# Patient Record
Sex: Female | Born: 1945
Health system: Southern US, Community
[De-identification: ages and names within clinical notes are randomized; demographics above are authoritative.]

## PROBLEM LIST (undated history)

## (undated) DIAGNOSIS — Z8051 Family history of malignant neoplasm of kidney: Secondary | ICD-10-CM

## (undated) DIAGNOSIS — I951 Orthostatic hypotension: Secondary | ICD-10-CM

## (undated) DIAGNOSIS — H269 Unspecified cataract: Secondary | ICD-10-CM

## (undated) DIAGNOSIS — E119 Type 2 diabetes mellitus without complications: Secondary | ICD-10-CM

## (undated) DIAGNOSIS — C541 Malignant neoplasm of endometrium: Secondary | ICD-10-CM

## (undated) DIAGNOSIS — K420 Umbilical hernia with obstruction, without gangrene: Secondary | ICD-10-CM

## (undated) DIAGNOSIS — M1990 Unspecified osteoarthritis, unspecified site: Secondary | ICD-10-CM

## (undated) DIAGNOSIS — Z9109 Other allergy status, other than to drugs and biological substances: Secondary | ICD-10-CM

## (undated) DIAGNOSIS — F329 Major depressive disorder, single episode, unspecified: Secondary | ICD-10-CM

## (undated) DIAGNOSIS — K649 Unspecified hemorrhoids: Secondary | ICD-10-CM

## (undated) DIAGNOSIS — Z803 Family history of malignant neoplasm of breast: Secondary | ICD-10-CM

## (undated) DIAGNOSIS — Z8049 Family history of malignant neoplasm of other genital organs: Secondary | ICD-10-CM

## (undated) DIAGNOSIS — Z800 Family history of malignant neoplasm of digestive organs: Secondary | ICD-10-CM

## (undated) DIAGNOSIS — F32A Depression, unspecified: Secondary | ICD-10-CM

## (undated) DIAGNOSIS — Z923 Personal history of irradiation: Secondary | ICD-10-CM

## (undated) DIAGNOSIS — C4491 Basal cell carcinoma of skin, unspecified: Secondary | ICD-10-CM

## (undated) HISTORY — PX: ABDOMINAL HYSTERECTOMY: SHX81

## (undated) HISTORY — DX: Depression, unspecified: F32.A

## (undated) HISTORY — DX: Family history of malignant neoplasm of kidney: Z80.51

## (undated) HISTORY — DX: Umbilical hernia with obstruction, without gangrene: K42.0

## (undated) HISTORY — DX: Type 2 diabetes mellitus without complications: E11.9

## (undated) HISTORY — DX: Personal history of irradiation: Z92.3

## (undated) HISTORY — DX: Orthostatic hypotension: I95.1

## (undated) HISTORY — DX: Family history of malignant neoplasm of other genital organs: Z80.49

## (undated) HISTORY — DX: Family history of malignant neoplasm of breast: Z80.3

## (undated) HISTORY — DX: Unspecified cataract: H26.9

## (undated) HISTORY — DX: Other allergy status, other than to drugs and biological substances: Z91.09

## (undated) HISTORY — DX: Family history of malignant neoplasm of digestive organs: Z80.0

## (undated) HISTORY — DX: Major depressive disorder, single episode, unspecified: F32.9

## (undated) HISTORY — PX: TONSILLECTOMY: SUR1361

## (undated) HISTORY — DX: Unspecified hemorrhoids: K64.9

## (undated) HISTORY — PX: CATARACT EXTRACTION: SUR2

---

## 1998-02-11 ENCOUNTER — Ambulatory Visit (HOSPITAL_COMMUNITY): Admission: RE | Admit: 1998-02-11 | Discharge: 1998-02-11 | Payer: Self-pay | Admitting: Family Medicine

## 1998-06-03 ENCOUNTER — Other Ambulatory Visit: Admission: RE | Admit: 1998-06-03 | Discharge: 1998-06-03 | Payer: Self-pay | Admitting: Family Medicine

## 1998-07-11 ENCOUNTER — Encounter: Admission: RE | Admit: 1998-07-11 | Discharge: 1998-10-09 | Payer: Self-pay | Admitting: Anesthesiology

## 1998-10-29 ENCOUNTER — Ambulatory Visit (HOSPITAL_COMMUNITY): Admission: RE | Admit: 1998-10-29 | Discharge: 1998-10-29 | Payer: Self-pay | Admitting: Family Medicine

## 1998-10-29 ENCOUNTER — Encounter: Payer: Self-pay | Admitting: Family Medicine

## 1999-08-19 ENCOUNTER — Encounter: Payer: Self-pay | Admitting: Specialist

## 1999-08-19 ENCOUNTER — Ambulatory Visit (HOSPITAL_COMMUNITY): Admission: RE | Admit: 1999-08-19 | Discharge: 1999-08-19 | Payer: Self-pay | Admitting: Specialist

## 1999-09-10 ENCOUNTER — Other Ambulatory Visit: Admission: RE | Admit: 1999-09-10 | Discharge: 1999-09-10 | Payer: Self-pay | Admitting: Family Medicine

## 1999-09-18 ENCOUNTER — Encounter: Payer: Self-pay | Admitting: Specialist

## 1999-09-25 ENCOUNTER — Encounter: Payer: Self-pay | Admitting: Specialist

## 1999-09-25 ENCOUNTER — Encounter (INDEPENDENT_AMBULATORY_CARE_PROVIDER_SITE_OTHER): Payer: Self-pay | Admitting: Specialist

## 1999-09-25 ENCOUNTER — Observation Stay (HOSPITAL_COMMUNITY): Admission: RE | Admit: 1999-09-25 | Discharge: 1999-09-26 | Payer: Self-pay | Admitting: Specialist

## 2000-02-26 ENCOUNTER — Encounter: Admission: RE | Admit: 2000-02-26 | Discharge: 2000-03-25 | Payer: Self-pay | Admitting: Family Medicine

## 2003-06-29 ENCOUNTER — Encounter: Admission: RE | Admit: 2003-06-29 | Discharge: 2003-06-29 | Payer: Self-pay | Admitting: Internal Medicine

## 2004-01-17 ENCOUNTER — Ambulatory Visit: Payer: Self-pay | Admitting: Family Medicine

## 2004-02-17 HISTORY — PX: LUMBAR DISC SURGERY: SHX700

## 2004-04-24 ENCOUNTER — Ambulatory Visit: Payer: Self-pay | Admitting: Family Medicine

## 2004-07-15 ENCOUNTER — Inpatient Hospital Stay (HOSPITAL_COMMUNITY): Admission: RE | Admit: 2004-07-15 | Discharge: 2004-07-20 | Payer: Self-pay | Admitting: Orthopedic Surgery

## 2004-08-25 ENCOUNTER — Ambulatory Visit: Payer: Self-pay | Admitting: Family Medicine

## 2004-11-25 ENCOUNTER — Ambulatory Visit: Payer: Self-pay | Admitting: Family Medicine

## 2005-03-04 ENCOUNTER — Ambulatory Visit: Payer: Self-pay | Admitting: Family Medicine

## 2005-06-02 ENCOUNTER — Ambulatory Visit: Payer: Self-pay | Admitting: Family Medicine

## 2005-06-29 ENCOUNTER — Ambulatory Visit: Payer: Self-pay | Admitting: Family Medicine

## 2005-10-15 ENCOUNTER — Ambulatory Visit: Payer: Self-pay | Admitting: Family Medicine

## 2005-12-18 ENCOUNTER — Ambulatory Visit: Payer: Self-pay | Admitting: Family Medicine

## 2005-12-29 ENCOUNTER — Ambulatory Visit: Payer: Self-pay | Admitting: Family Medicine

## 2006-01-14 ENCOUNTER — Ambulatory Visit: Payer: Self-pay | Admitting: Family Medicine

## 2006-08-04 ENCOUNTER — Ambulatory Visit: Payer: Self-pay | Admitting: Family Medicine

## 2009-04-22 ENCOUNTER — Emergency Department (HOSPITAL_COMMUNITY): Admission: EM | Admit: 2009-04-22 | Discharge: 2009-04-22 | Payer: Self-pay | Admitting: Emergency Medicine

## 2010-07-04 NOTE — Discharge Summary (Signed)
NAMEMARQUITTA, [name]:  [id number]   MEDICAL RECORD NO.:  [id number]          PATIENT TYPE:  INP   LOCATION:  5005                         FACILITY:  [facility]   PHYSICIAN:  Nelda Severe, MD      DATE OF BIRTH:  08/29/1945   DATE OF ADMISSION:  07/15/2004  DATE OF DISCHARGE:  07/20/2004                                 DISCHARGE SUMMARY   HOSPITAL COURSE:  This woman was admitted for management of back and left  leg pain, status post previous L4-5 laminectomy.  The day of admission she  was taken to the operating room where revision laminectomy and fusion were  carried out at L4-5 with laminoforaminotomy at L3-4 on the left side.  Postoperatively, she did well except for having some right thigh pain which  had previously not experienced.  CT scan was performed and reviewed prior to  her discharge.  This does not show any evidence of pedicle screw impingement  upon an exiting L4 or 5 nerve root on the right side.  At this time, the  pain is abating anyway.   She is being discharged at this time for followup in 1 month in the office.  She will call me for any problems with wound drainage or fever, or for any  other problems for that matter.  She is ambulatory with a walker.  She is  being given prescriptions for Norco 10 mg one to two q.6 h. p.r.n., 75  tablets, Flexeril one p.o. q.6 h. p.r.n. for back spasms, 30 tablets, and  Colace 100 mg p.o. b.i.d. for 3 weeks.  She has Lexapro, but has that  medication at home.   FINAL DIAGNOSIS:  Lumbar spondylosis, status post laminectomy.   CONDITION ON DISCHARGE:  Ambulatory, preoperative pain improved.   DISCHARGE MEDICATIONS:  See above.   FOLLOWUP:  See above.       MT/MEDQ  D:  07/20/2004  T:  07/21/2004  Job:  [id number]

## 2010-07-04 NOTE — Op Note (Signed)
[facility]  Patient:    Debra Miranda, Debra Miranda                         MRN: [id number] Proc. Date: 09/25/99 Attending:  Javier Docker, M.D.                           Operative Report  PREOPERATIVE DIAGNOSIS:  Herniated nucleus pulposus central L4-5 with associated spinal stenosis and lateral recess stenosis.  POSTOPERATIVE DIAGNOSIS:  Herniated nucleus pulposus central L4-5 with associated with spinal stenosis and lateral recess stenosis.  OPERATION PERFORMED:  Bilateral hemilaminotomy L4-5 and bilateral microdiskectomy L4-5.  SURGEON:  Javier Docker, M.D.  ANESTHESIA:  General.  BRIEF HISTORY:  This is a 65 year old with refractory L5 radiculopathy.  A CT myelogram indicating lateral recess stenosis L4-5.  She has a combination of central HNP and facet hypertrophy.  Operative intervention is indicated for decompression of the L5 nerve root bilaterally, bilateral recess decompression and microdiskectomy.  Risks and benefits were discussed including bleeding, infection, CSF leakage, epidural fibrosis, hematoma, etc.  Also need for fusion in the future.  DESCRIPTION OF PROCEDURE:  With the patient in the supine position after the induction of adequate general anesthesia, the patient was placed prone on the [place] frame.  All bony prominences were well padded.  The lumbar region was prepped and draped in the usual sterile fashion.  Two 18-gauge spinal needles were utilized to localize the L4-5 interspace confirmed on x-ray.  An incision was made from the spinous process of L4-5.  The subcutaneous tissue was dissected and cartilage utilized to achieve hemostasis.  The dorsolumbar fascia was identified and divided in line with the skin incision on either side of the interspinous ligament at L4-5.  The paraspinal muscles were elevated bilaterally with a Cobb elevator at lamina 4 and 5.  A McCullough retractor was placed.  A Penfield 4 was placed in the  interlaminar space and confirmed 4-5 space by x-ray.  Next, a high speed bur was utilized to perform a hemilaminotomy of 4. Bilaterally, the lateral recess was decompressed with a 2 mm Kerrison and severe lateral recess stenosis appreciated.  A foraminotomy of L5 was performed bilaterally with the 2 mm Kerrison.  The nerve root was identified and found to be compressing the lateral recess.  It was gently mobilized medially and the lateral recess was further decompressed utilizing a 2 mm Kerrison.  Partial medial hemifacetectomies were performed bilaterally.  Next, attention was turned to the site where there was extensive epidural venous plexus.  Bipolar electrocautery was utilized to achieve hemostasis. Herniated nucleus pulposus central was noted and an annulotomy was performed. A copious portion of disk material was removed from the interspace decompressing the lateral recess.  This was performed on the contralateral side as well.  A larger HNP was noted here on the left.  Neural elements were protected at all times and a satisfactory decompression was performed.  There was no residual herniated disk material noted beneath the thecal sac into the foramen of 4 or 5, or in the axilla 4-5 nerve roots bilaterally. Electrocautery was utilized to achieve strict hemostasis on this side as well. The lateral recess in a similar fashion had been decompressed on the left and a partial medial facetectomy and L5 foraminotomy.  The L5 nerve root was noted to be well decompressed bilaterally.  It was pulsatile as well as the thecal sac  with no evidence of CSF leakage or residual pressure noted.  No evidence of active bleeding.  The McCullough retractor was removed and the paraspinous muscles inspected with no evidence of active bleeding.  Thrombin soaked Gelfoam was placed in the lamina on the defect.  Foramina L4 and L5 were found to be widely patent.  Next, the dorsolumbar fascia was  reapproximated with #1 Vicryl and the subcutaneous tissue reapproximated with 2-0 simple suture.  The skin was reapproximated with 3-0 subcuticular Prolene.  The wound was reinforced with Steri-Strips.  Sterile dressing applied.  The patient was placed supine on the hospital bed and extubated without difficulty.  She was transported to the recovery room in satisfactory condition.  Estimated blood loss of 20 cc. DD:  09/25/99 TD:  09/26/99 Job: [id number]

## 2010-07-04 NOTE — Op Note (Signed)
Debra Miranda, NIKOLOV NO.:  [id number]   MEDICAL RECORD NO.:  [id number]          PATIENT TYPE:  INP   LOCATION:  2550                         FACILITY:  [facility]   PHYSICIAN:  Nelda Severe, MD      DATE OF BIRTH:  1945-05-10   DATE OF PROCEDURE:  07/15/2004  DATE OF DISCHARGE:                                 OPERATIVE REPORT   SURGEON:  Nelda Severe, M.D.   ASSISTANT:  Patrick Jupiter, R.N.   PREOPERATIVE DIAGNOSES:  Lumbar spondylosis, L4-5 disk degeneration, lumbar  foraminal stenosis.   POSTOPERATIVE DIAGNOSES:  Lumbar spondylosis, L4-5 disk degeneration, lumbar  foraminal stenosis.   OPERATION:  1. L3-4 lamino-foraminotomy, left side.  2. L4-5 left-sided laminectomy and foraminotomy.  3. L4-5 posterolateral fusion.  4. Posterior fusion with autogenous graft and beta tri-calcium phosphate      and bone marrow aspirate.  5. Insertion of pedicle screws at L4 and L5 bilaterally (Synthes Click-X).     INDICATIONS FOR PROCEDURE:  Back pain and left lower extremity pain.   DESCRIPTION OF PROCEDURE:  The patient is placed under general endotracheal  anesthesia.  A Foley catheter (non-latex), was placed in the bladder.  One  gram of Ancef was administered intravenously.  Bilateral sequential  compression devices were placed on the lower extremities.  Neuro-metric  monitoring devices were attached to limbs and scalp.   The patient was positioned prone on an AcroMed four-poster frame, placed on  a Jackson table.  The upper extremities were positioned so as to avoid  hyperflexion and abduction of the shoulders, and so as to avoid hyperflexion  of the elbows.  The upper extremities were padded with foam and there was no  pressure on the cubital tunnels on either side.  The hips and knees were  gently flexed and supported with pillows.   The proposed incision was marked on the skin with a marker, including the  previous incision for a disk excision.  The lumbar  area was prepped with  DuraPrep and draped in a rectangular fashion.  The drapes were secured with  Ioban.  A midline incision is made from approximately L3 proximally to S1  distally, including the previous incision.  The subcutaneous tissue was  injected with a mixture of 0.25% Marcaine with epinephrine and 1% plain  lidocaine.  Dissection was carried down with the cutting current onto the  spinous processes.  The paraspinous muscles were reflected bilaterally at L3-  4 and L4-5, to reveal the transverse processes of L3, L4 and L5.  A cross-  table lateral radiograph was taken, showing a Kocher in place on the spinous  process of L4.  Next, pedicle holes were made bilaterally at L4 and L5.  This was done in the same fashion in each instance.  The base of the  superior articular process was identified at its junction with the pars  interarticularis.  A piece of bone was removed at this junction with a  Leksell rongeur.  The posterior pedicle was identified and perforated with  an awl.  A pedicle probe was then  used to make a hole through the pedicle  into the vertebral body.  Each hole was carefully sounded with a ball-tip  probe.  Depth measured, and then carefully palpated circumferentially to  make sure that there was no defect.  Flow-Seal was injected into each hole,  and then a radio-opaque marker placed and sealed with bone wax.  A cross-  table lateral radiograph was taken, which showed the markers in very good  position.   Next, the process of bone graft harvest was carried out locally using a  small acetabular reamer.  The L4-5 apophyseal joints were reamed bilaterally  and thinned down, as was the lamina bilaterally.  This produced a moderate  amount of graft, more than anticipated.   Next, we performed a laminectomy on the left side at L4-5, including  complete inferior facetectomy and partial superior facetectomy.  At this  point, a probe was easily admitted into the L4-5  neural foramen and there  was no evidence of any nerve root compression.   Next, we proceed proximally and performed a lateral recess decompression at  L3-4, trimming away the distal portion of the L3 inferior articular process  and the medial superior articular process.  A probe was admitted proximally  and this felt well-decompressed.  Not having significantly destabilized the  L3-4 level, I decided not to extend the fusion to L3, which had a  possibility preoperatively.   Next, about 10 mL of the bone graft were sequestrated and kept separately.  The rest of the graft was mixed with 20 mL of beta tri-calcium phosphate and  20 mL of bone marrow aspirate, which was aspirated using a #18 gauge needle  from the right posterior iliac crest.  The transverse processes at L5 and L4  on the left side were decorticated and a moderate quantity of graft placed  posterolaterally.  At this point 6.2 mm diameter screws measuring 45 mm in  length were placed at the L5 and L4 pedicles on the left side.  These screws  were stimulated and EMG activity distally was recorded well above threshold  values.  The coupling devices were attached to the screws and then a 45 mm  free-contoured rod placed and the caps placed on the couplings and  provisionally tightened.   Next, on the right side the transverse processes were decorticated at L4 and  L5 and a moderate quantity of the mixture of graft and beta tri-calcium  phosphate placed between the transverse processes.  The screws were then  inserted in the same fashion as they had been done on the left, 6.2 mm  diameter screws x 45 mm diameter in length.  All holes were pre-tapped with  a 5.2 mm tap and probed again to make sure they remained circumferentially  intact.  The coupling devices were then placed on the screws.  A 45 mm pre-  contoured rod was placed and then the caps placed and provisionally tightened.  A cross-table radiograph showed good position  of the screws.  Prior to placing the caps and rod, these screws were also stimulated and the  distal EMG recordings were stimulated, all above threshold levels.   Next, the remaining mixture of beta tri-calcium phosphate and autogenous  graft was packed posterolaterally on either side.  The lamina at L4 and L5  on the right side, where no laminectomy was performed were carefully  decorticated, especially the paras inter-articularis of L4.  The articular  surfaces of the apophyseal joint  at L4-5 on the right side were then  carefully resected.  The bone graft, which had not been mixed with beta tri-  calcium phosphate was then placed posteriorly, particularly in the region of  the facet joint and pars inter-articularis.  The couplings were all torqued.  As noted, the cross-table radiograph looked satisfactory.  The laminectomy  site was carefully inspected to make sure that no fragments of bone had  entered.  Some Gelfoam was placed dorsal to the dura, to make sure that no  graft would gain entry.  We then closed the wound in layers using continuous  #1 Vicryl in the lumbar fascia, interrupted and continuous #2-0 Vicryl in  the subcutaneous tissue over a 1/8th inch Hemovac drain, brought up through  the right side.  The skin was then closed using continuous #3- undyed Vicryl  in a subcuticular fashion with the skin edges being reinforced with Steri-  Strips.  The 1/8th inch Hemovac drain was secured with a basket weave-type  of #2-0 nylon suture.  A non-adherent antibiotic ointment dressing was then  applied and secured with Op-Site.  There was insufficient blood loss to be  able to return any Cell Saver blood.  There were no intraoperative  complications.  The sponge and needle counts were correct.   The patient was removed from the table.  At the time of dictation she has  not awakened sufficiently to perform a neurologic examination.      MT/MEDQ  D:  07/15/2004  T:  07/15/2004   Job:  [id number]

## 2013-03-17 ENCOUNTER — Encounter: Payer: Self-pay | Admitting: Family Medicine

## 2013-03-17 ENCOUNTER — Ambulatory Visit (INDEPENDENT_AMBULATORY_CARE_PROVIDER_SITE_OTHER): Payer: Medicare PPO | Admitting: Family Medicine

## 2013-03-17 ENCOUNTER — Encounter (INDEPENDENT_AMBULATORY_CARE_PROVIDER_SITE_OTHER): Payer: Self-pay

## 2013-03-17 VITALS — BP 130/72 | HR 64 | Temp 97.3°F | Ht 66.0 in | Wt 180.0 lb

## 2013-03-17 DIAGNOSIS — Z1239 Encounter for other screening for malignant neoplasm of breast: Secondary | ICD-10-CM

## 2013-03-17 DIAGNOSIS — Z1211 Encounter for screening for malignant neoplasm of colon: Secondary | ICD-10-CM

## 2013-03-17 DIAGNOSIS — E119 Type 2 diabetes mellitus without complications: Secondary | ICD-10-CM

## 2013-03-17 DIAGNOSIS — Z23 Encounter for immunization: Secondary | ICD-10-CM

## 2013-03-17 MED ORDER — METFORMIN HCL 500 MG PO TABS
500.0000 mg | ORAL_TABLET | Freq: Two times a day (BID) | ORAL | Status: DC
Start: 2013-03-17 — End: 2014-05-09

## 2013-03-17 NOTE — Progress Notes (Signed)
   Subjective:    Patient ID: Debra Miranda, female    DOB: 1945-08-05, 68 y.o.   MRN: [id number]  HPI This 68 y.o. female presents for evaluation of establishment visit.  She has hx of diabetes  And she is needing refills on metformin.  She is due for pap and mammogram.   Review of Systems No chest pain, SOB, HA, dizziness, vision change, N/V, diarrhea, constipation, dysuria, urinary urgency or frequency, myalgias, arthralgias or rash.     Objective:   Physical Exam  Vital signs noted  Well developed well nourished female.  HEENT - Head atraumatic Normocephalic                Eyes - PERRLA, Conjuctiva - clear Sclera- Clear EOMI                Ears - EAC's Wnl TM's Wnl Gross Hearing WNL                Nose - Nares patent                 Throat - oropharanx wnl Respiratory - Lungs CTA bilateral Cardiac - RRR S1 and S2 without murmur GI - Abdomen soft Nontender and bowel sounds active x 4 Extremities - No edema. Neuro - Grossly intact.      Assessment & Plan:  Breast screening - Plan: MM Digital Screening, metFORMIN (GLUCOPHAGE) 500 MG tablet  Colon cancer screening - Plan: Ambulatory referral to Gastroenterology, metFORMIN (GLUCOPHAGE) 500 MG tablet  Need for prophylactic vaccination against Streptococcus pneumoniae (pneumococcus) - Plan: Pneumococcal conjugate vaccine 13-valent, metFORMIN (GLUCOPHAGE) 500 MG tablet  Diabetes - Plan: metFORMIN (GLUCOPHAGE) 500 MG tablet one po bid  Follow up in 3 months  Lysbeth Penner FNP

## 2013-03-17 NOTE — Patient Instructions (Signed)

## 2013-03-24 ENCOUNTER — Encounter: Payer: Self-pay | Admitting: Family Medicine

## 2013-04-14 ENCOUNTER — Encounter: Payer: Self-pay | Admitting: Gastroenterology

## 2013-04-21 ENCOUNTER — Ambulatory Visit (AMBULATORY_SURGERY_CENTER): Payer: Self-pay | Admitting: *Deleted

## 2013-04-21 VITALS — Ht 68.0 in | Wt 178.0 lb

## 2013-04-21 DIAGNOSIS — Z1211 Encounter for screening for malignant neoplasm of colon: Secondary | ICD-10-CM

## 2013-04-21 MED ORDER — MOVIPREP 100 G PO SOLR: 1.0000 | Freq: Once | ORAL | Status: DC

## 2013-04-21 NOTE — Progress Notes (Signed)
No egg or soy allergy. No anesthesia problems.  

## 2013-04-25 ENCOUNTER — Encounter: Payer: Self-pay | Admitting: Gastroenterology

## 2013-04-25 ENCOUNTER — Telehealth: Payer: Self-pay | Admitting: Family Medicine

## 2013-04-26 NOTE — Telephone Encounter (Signed)
The umbilical hernia should not be a problem but should probably get a surgeon to do her colonoscopy instead Of GI since she is concerned and she has called GI and they are referring her back to [place] so then I would send her To surgeon for eval of umbilical hernia and then if he feels like it is okay then he can do colonoscopy.  So if she Decides to go ahead and get surgical referral for umbilical hernia and colonoscopy by surgery then we will order Surgical consult instead of GI consult

## 2013-04-27 ENCOUNTER — Ambulatory Visit: Payer: Medicare PPO | Admitting: Family Medicine

## 2013-04-27 NOTE — Telephone Encounter (Signed)
Spoke with pt regading umbilical hernia Pt will come by today for appt

## 2013-05-03 ENCOUNTER — Encounter: Payer: Self-pay | Admitting: Gastroenterology

## 2013-05-08 ENCOUNTER — Encounter: Payer: Self-pay | Admitting: Gastroenterology

## 2013-06-09 ENCOUNTER — Ambulatory Visit (INDEPENDENT_AMBULATORY_CARE_PROVIDER_SITE_OTHER): Payer: Medicare PPO | Admitting: Family Medicine

## 2013-06-09 ENCOUNTER — Encounter: Payer: Self-pay | Admitting: Family Medicine

## 2013-06-09 VITALS — BP 120/75 | HR 79 | Temp 98.1°F | Ht 66.0 in | Wt 176.0 lb

## 2013-06-09 DIAGNOSIS — F3289 Other specified depressive episodes: Secondary | ICD-10-CM

## 2013-06-09 DIAGNOSIS — F32A Depression, unspecified: Secondary | ICD-10-CM

## 2013-06-09 DIAGNOSIS — F329 Major depressive disorder, single episode, unspecified: Secondary | ICD-10-CM

## 2013-06-09 DIAGNOSIS — E119 Type 2 diabetes mellitus without complications: Secondary | ICD-10-CM

## 2013-06-09 LAB — POCT GLYCOSYLATED HEMOGLOBIN (HGB A1C): Hemoglobin A1C: 6.5

## 2013-06-09 MED ORDER — ESCITALOPRAM OXALATE 10 MG PO TABS: 10.0000 mg | ORAL_TABLET | Freq: Every day | ORAL | Status: DC

## 2013-06-09 NOTE — Progress Notes (Signed)
   Subjective:    Patient ID: Debra Miranda, female    DOB: 1945/12/09, 68 y.o.   MRN: [id number]  HPI  This 68 y.o. female presents for evaluation of diabetes visit and she wants to get back on lexapro Her depression medicine that she has been off for awhile.  Review of Systems    No chest pain, SOB, HA, dizziness, vision change, N/V, diarrhea, constipation, dysuria, urinary urgency or frequency, myalgias, arthralgias or rash.  Objective:   Physical Exam Vital signs noted  Well developed well nourished female.  HEENT - Head atraumatic Normocephalic                Eyes - PERRLA, Conjuctiva - clear Sclera- Clear EOMI                Ears - EAC's Wnl TM's Wnl Gross Hearing WNL                Nose - Nares patent                 Throat - oropharanx wnl Respiratory - Lungs CTA bilateral Cardiac - RRR S1 and S2 without murmur GI - Abdomen soft Nontender and bowel sounds active x 4 Extremities - No edema. Neuro - Grossly intact.   Results for orders placed in visit on 06/09/13  POCT GLYCOSYLATED HEMOGLOBIN (HGB A1C)      Result Value Ref Range   Hemoglobin A1C 6.5        Assessment & Plan:  Depression - Plan: escitalopram (LEXAPRO) 10 MG tablet  Diabetes - Plan: POCT CBC, CMP14+EGFR, POCT glycosylated hemoglobin (Hb A1C)  Lysbeth Penner FNP

## 2013-06-10 LAB — CMP14+EGFR
ALT: 9 IU/L (ref 0–32)
AST: 16 IU/L (ref 0–40)
Albumin/Globulin Ratio: 2.1 (ref 1.1–2.5)
Albumin: 4.4 g/dL (ref 3.6–4.8)
Alkaline Phosphatase: 96 IU/L (ref 39–117)
BUN/Creatinine Ratio: 15 (ref 11–26)
BUN: 9 mg/dL (ref 8–27)
CO2: 24 mmol/L (ref 18–29)
Calcium: 9.4 mg/dL (ref 8.7–10.3)
Chloride: 100 mmol/L (ref 97–108)
Creatinine, Ser: 0.59 mg/dL (ref 0.57–1.00)
GFR calc Af Amer: 110 mL/min/{1.73_m2} (ref >59)
GFR calc non Af Amer: 96 mL/min/{1.73_m2} (ref >59)
Globulin, Total: 2.1 g/dL (ref 1.5–4.5)
Glucose: 150 mg/dL — ABNORMAL HIGH (ref 65–99)
Potassium: 3.8 mmol/L (ref 3.5–5.2)
Sodium: 141 mmol/L (ref 134–144)
Total Bilirubin: 0.3 mg/dL (ref 0.0–1.2)
Total Protein: 6.5 g/dL (ref 6.0–8.5)

## 2013-06-14 ENCOUNTER — Other Ambulatory Visit: Payer: Self-pay | Admitting: Family Medicine

## 2013-06-15 LAB — HM DIABETES EYE EXAM

## 2013-06-16 ENCOUNTER — Ambulatory Visit: Payer: Medicare PPO | Admitting: Family Medicine

## 2013-08-24 ENCOUNTER — Telehealth: Payer: Self-pay | Admitting: Family Medicine

## 2013-08-24 NOTE — Telephone Encounter (Signed)
appt scheduled for 7/28 with bill and patient aware he does not do paps and will decide who see wants to see for her pap and then will schedule

## 2013-09-11 ENCOUNTER — Ambulatory Visit: Payer: Medicare PPO | Admitting: Family Medicine

## 2013-09-12 ENCOUNTER — Encounter: Payer: Self-pay | Admitting: Family Medicine

## 2013-09-12 ENCOUNTER — Ambulatory Visit (INDEPENDENT_AMBULATORY_CARE_PROVIDER_SITE_OTHER): Payer: Medicare PPO | Admitting: Family Medicine

## 2013-09-12 VITALS — BP 120/74 | HR 74 | Temp 98.3°F | Ht 66.0 in | Wt 170.4 lb

## 2013-09-12 DIAGNOSIS — Z139 Encounter for screening, unspecified: Secondary | ICD-10-CM

## 2013-09-12 DIAGNOSIS — E119 Type 2 diabetes mellitus without complications: Secondary | ICD-10-CM

## 2013-09-12 DIAGNOSIS — F3289 Other specified depressive episodes: Secondary | ICD-10-CM

## 2013-09-12 DIAGNOSIS — F329 Major depressive disorder, single episode, unspecified: Secondary | ICD-10-CM

## 2013-09-12 NOTE — Progress Notes (Signed)
   Subjective:    Patient ID: Debra Miranda, female    DOB: 1945-12-01, 68 y.o.   MRN: [id number]  HPI This 68 y.o. female presents for evaluation of routine follow up.  She has hx of depression and diabetes.   Review of Systems No chest pain, SOB, HA, dizziness, vision change, N/V, diarrhea, constipation, dysuria, urinary urgency or frequency, myalgias, arthralgias or rash.     Objective:   Physical Exam  Vital signs noted  Well developed well nourished female.  HEENT - Head atraumatic Normocephalic                Eyes - PERRLA, Conjuctiva - clear Sclera- Clear EOMI                Ears - EAC's Wnl TM's Wnl Gross Hearing WNL                Nose - Nares patent                 Throat - oropharanx wnl Respiratory - Lungs CTA bilateral Cardiac - RRR S1 and S2 without murmur GI - Abdomen soft Nontender and bowel sounds active x 4 Extremities - No edema. Neuro - Grossly intact.      Assessment & Plan:  Screening - Plan: Ambulatory referral to Gynecology  Type II or unspecified type diabetes mellitus without mention of complication, not stated as uncontrolled - Plan: POCT CBC, POCT glycosylated hemoglobin (Hb A1C), POCT UA - Microalbumin, CMP14+EGFR, Lipid panel  Follow up in 3 months  Lysbeth Penner FNP

## 2013-09-13 ENCOUNTER — Other Ambulatory Visit: Payer: Medicare PPO

## 2013-09-13 LAB — POCT CBC
Granulocyte percent: 75.6 %G (ref 37–80)
HCT, POC: 45.2 % (ref 37.7–47.9)
Hemoglobin: 15.1 g/dL (ref 12.2–16.2)
Lymph, poc: 1.7 (ref 0.6–3.4)
MCH, POC: 29.6 pg (ref 27–31.2)
MCHC: 33.5 g/dL (ref 31.8–35.4)
MCV: 88.4 fL (ref 80–97)
MPV: 10.4 fL (ref 0–99.8)
POC Granulocyte: 6.2 (ref 2–6.9)
POC LYMPH PERCENT: 21.3 %L (ref 10–50)
Platelet Count, POC: 117 10*3/uL — AB (ref 142–424)
RBC: 5.1 M/uL (ref 4.04–5.48)
RDW, POC: 14.4 %
WBC: 8.2 10*3/uL (ref 4.6–10.2)

## 2013-09-13 LAB — POCT GLYCOSYLATED HEMOGLOBIN (HGB A1C): Hemoglobin A1C: 6.3

## 2013-09-14 LAB — CMP14+EGFR
ALT: 9 IU/L (ref 0–32)
AST: 11 IU/L (ref 0–40)
Albumin/Globulin Ratio: 1.7 (ref 1.1–2.5)
Albumin: 4.2 g/dL (ref 3.6–4.8)
Alkaline Phosphatase: 95 IU/L (ref 39–117)
BUN/Creatinine Ratio: 19 (ref 11–26)
BUN: 11 mg/dL (ref 8–27)
CO2: 22 mmol/L (ref 18–29)
Calcium: 9.2 mg/dL (ref 8.7–10.3)
Chloride: 103 mmol/L (ref 97–108)
Creatinine, Ser: 0.59 mg/dL (ref 0.57–1.00)
GFR calc Af Amer: 110 mL/min/{1.73_m2} (ref >59)
GFR calc non Af Amer: 96 mL/min/{1.73_m2} (ref >59)
Globulin, Total: 2.5 g/dL (ref 1.5–4.5)
Glucose: 137 mg/dL — ABNORMAL HIGH (ref 65–99)
Potassium: 4.2 mmol/L (ref 3.5–5.2)
Sodium: 142 mmol/L (ref 134–144)
Total Bilirubin: 0.4 mg/dL (ref 0.0–1.2)
Total Protein: 6.7 g/dL (ref 6.0–8.5)

## 2013-09-14 LAB — LIPID PANEL
Chol/HDL Ratio: 4.2 ratio units (ref 0.0–4.4)
Cholesterol, Total: 199 mg/dL (ref 100–199)
HDL: 47 mg/dL (ref >39)
LDL Calculated: 121 mg/dL — ABNORMAL HIGH (ref 0–99)
Triglycerides: 156 mg/dL — ABNORMAL HIGH (ref 0–149)
VLDL Cholesterol Cal: 31 mg/dL (ref 5–40)

## 2013-10-10 ENCOUNTER — Ambulatory Visit: Payer: Medicare PPO | Admitting: Obstetrics & Gynecology

## 2013-10-10 DIAGNOSIS — Z01419 Encounter for gynecological examination (general) (routine) without abnormal findings: Secondary | ICD-10-CM

## 2013-12-01 ENCOUNTER — Other Ambulatory Visit: Payer: Self-pay

## 2013-12-07 ENCOUNTER — Ambulatory Visit (INDEPENDENT_AMBULATORY_CARE_PROVIDER_SITE_OTHER): Payer: Medicare PPO

## 2013-12-07 DIAGNOSIS — Z23 Encounter for immunization: Secondary | ICD-10-CM

## 2014-02-21 ENCOUNTER — Telehealth: Payer: Self-pay

## 2014-03-13 NOTE — Telephone Encounter (Signed)
closed

## 2014-05-09 ENCOUNTER — Ambulatory Visit (INDEPENDENT_AMBULATORY_CARE_PROVIDER_SITE_OTHER): Payer: Medicare PPO

## 2014-05-09 ENCOUNTER — Ambulatory Visit (INDEPENDENT_AMBULATORY_CARE_PROVIDER_SITE_OTHER): Payer: Medicare PPO | Admitting: Family Medicine

## 2014-05-09 ENCOUNTER — Encounter: Payer: Self-pay | Admitting: Family Medicine

## 2014-05-09 VITALS — BP 134/81 | HR 78 | Temp 97.5°F | Ht 66.0 in | Wt 190.0 lb

## 2014-05-09 DIAGNOSIS — Z780 Asymptomatic menopausal state: Secondary | ICD-10-CM | POA: Diagnosis not present

## 2014-05-09 DIAGNOSIS — R5383 Other fatigue: Secondary | ICD-10-CM | POA: Diagnosis not present

## 2014-05-09 DIAGNOSIS — E119 Type 2 diabetes mellitus without complications: Secondary | ICD-10-CM

## 2014-05-09 DIAGNOSIS — E785 Hyperlipidemia, unspecified: Secondary | ICD-10-CM | POA: Diagnosis not present

## 2014-05-09 LAB — POCT CBC
Granulocyte percent: 78.1 %G (ref 37–80)
HEMATOCRIT: 47.1 % (ref 37.7–47.9)
HEMOGLOBIN: 15.2 g/dL (ref 12.2–16.2)
Lymph, poc: 1.5 (ref 0.6–3.4)
MCH: 27.9 pg (ref 27–31.2)
MCHC: 32.2 g/dL (ref 31.8–35.4)
MCV: 86.7 fL (ref 80–97)
MPV: 10.4 fL (ref 0–99.8)
POC Granulocyte: 6.2 (ref 2–6.9)
POC LYMPH %: 18.8 % (ref 10–50)
Platelet Count, POC: 119 10*3/uL — AB (ref 142–424)
RBC: 5.43 M/uL (ref 4.04–5.48)
RDW, POC: 13.7 %
WBC: 7.9 10*3/uL (ref 4.6–10.2)

## 2014-05-09 LAB — POCT GLYCOSYLATED HEMOGLOBIN (HGB A1C)

## 2014-05-09 NOTE — Patient Instructions (Addendum)

## 2014-05-09 NOTE — Progress Notes (Signed)
Subjective:  Patient ID: Debra Miranda, female    DOB: Jun 25, 1945  Age: 69 y.o. MRN: [id number]  CC: Diabetes   HPI Debra Miranda presents for follow-up of diabetes. Patient does check blood sugar at home Patient denies symptoms such as polyuria, polydipsia, excessive hunger, nausea No significant hypoglycemic spells noted. She has noted increasing fatigue recently. Medications as noted below. Not taking them for 2 months. without complication/adverse reaction being reported today.   Patient in for follow-up of elevated cholesterol. Doing well without complaints on current medication. Denies side effects of statin including myalgia and arthralgia and nausea. Also in today for liver function testing. Currently no chest pain, shortness of breath or other cardiovascular related symptoms noted. History Debra Miranda has a past medical history of Diabetes mellitus without complication; Orthostatic hypotension; Allergy; and Depression.   She has past surgical history that includes Tonsillectomy and Lumbar disc surgery.   Her family history includes Alzheimer's disease in her brother; Cancer in her mother; Diabetes in her brother; Pulmonary fibrosis in her brother; Stroke in her father. There is no history of Colon cancer.She reports that she quit smoking about 3 years ago. She uses smokeless tobacco. She reports that she drinks alcohol. She reports that she does not use illicit drugs.  Current Outpatient Prescriptions on File Prior to Visit  Medication Sig Dispense Refill  . EMBRACE BLOOD GLUCOSE TEST test strip     . PHARMACIST CHOICE LANCETS MISC      No current facility-administered medications on file prior to visit.    ROS Review of Systems  Constitutional: Negative for fever, chills, diaphoresis, appetite change, fatigue and unexpected weight change.  HENT: Negative for congestion, ear pain, hearing loss, postnasal drip, rhinorrhea, sneezing, sore throat and trouble swallowing.   Eyes:  Negative for pain.  Respiratory: Negative for cough, chest tightness and shortness of breath.   Cardiovascular: Negative for chest pain and palpitations.  Gastrointestinal: Negative for nausea, vomiting, abdominal pain, diarrhea and constipation.  Genitourinary: Negative for dysuria, frequency and menstrual problem.  Musculoskeletal: Negative for joint swelling and arthralgias.  Skin: Negative for rash.  Neurological: Negative for dizziness, weakness, numbness and headaches.  Psychiatric/Behavioral: Negative for dysphoric mood and agitation.    Objective:  BP 134/81 mmHg  Pulse 78  Temp(Src) 97.5 F (36.4 C) (Oral)  Ht _0  (1.676 m)  Wt 190 lb (86.183 kg)  BMI 30.68 kg/m2  Physical Exam  Constitutional: She is oriented to person, place, and time. She appears well-developed and well-nourished. No distress.  HENT:  Head: Normocephalic and atraumatic.  Right Ear: External ear normal.  Left Ear: External ear normal.  Nose: Nose normal.  Mouth/Throat: Oropharynx is clear and moist.  Eyes: Conjunctivae and EOM are normal. Pupils are equal, round, and reactive to light.  Neck: Normal range of motion. Neck supple. No thyromegaly present.  Cardiovascular: Normal rate, regular rhythm and normal heart sounds.   No murmur heard. Pulmonary/Chest: Effort normal and breath sounds normal. No respiratory distress. She has no wheezes. She has no rales.  Abdominal: Soft. Bowel sounds are normal. She exhibits no distension. There is no tenderness.  Lymphadenopathy:    She has no cervical adenopathy.  Neurological: She is alert and oriented to person, place, and time. She has normal reflexes.  Skin: Skin is warm and dry.  Psychiatric: She has a normal mood and affect. Her behavior is normal. Judgment and thought content normal.   Results for orders placed or performed in visit on  05/09/14  CMP14+EGFR  Result Value Ref Range   Glucose 195 (H) 65 - 99 mg/dL   BUN 9 8 - 27 mg/dL   Creatinine,  Ser 0.64 0.57 - 1.00 mg/dL   GFR calc non Af Amer 93 >59 mL/min/1.73   GFR calc Af Amer 107 >59 mL/min/1.73   BUN/Creatinine Ratio 14 11 - 26   Sodium 141 134 - 144 mmol/L   Potassium 4.5 3.5 - 5.2 mmol/L   Chloride 104 97 - 108 mmol/L   CO2 21 18 - 29 mmol/L   Calcium 9.5 8.7 - 10.3 mg/dL   Total Protein 6.8 6.0 - 8.5 g/dL   Albumin 4.2 3.6 - 4.8 g/dL   Globulin, Total 2.6 1.5 - 4.5 g/dL   Albumin/Globulin Ratio 1.6 1.1 - 2.5   Bilirubin Total 0.3 0.0 - 1.2 mg/dL   Alkaline Phosphatase 115 39 - 117 IU/L   AST 12 0 - 40 IU/L   ALT 9 0 - 32 IU/L  NMR, lipoprofile  Result Value Ref Range   LDL Particle Number [id number] (H) <1000 nmol/L   LDL-C 135 (H) 0 - 99 mg/dL   HDL Cholesterol by NMR 51 >39 mg/dL   Triglycerides by NMR 108 0 - 149 mg/dL   Cholesterol 208 (H) 100 - 199 mg/dL   HDL Particle Number 32.4 >=30.5 umol/L   Small LDL Particle Number <90 <=527 nmol/L   LDL Size 21.3 >20.5 nm   LP-IR Score 43 <=45  Thyroid Panel With TSH  Result Value Ref Range   TSH 1.650 0.450 - 4.500 uIU/mL   T4, Total 7.2 4.5 - 12.0 ug/dL   T3 Uptake Ratio 25 24 - 39 %   Free Thyroxine Index 1.8 1.2 - 4.9  Vit D  25 hydroxy (rtn osteoporosis monitoring)  Result Value Ref Range   Vit D, 25-Hydroxy 16.9 (L) 30.0 - 100.0 ng/mL  POCT glycosylated hemoglobin (Hb A1C)  Result Value Ref Range   Hemoglobin A1C 8.0%   POCT CBC  Result Value Ref Range   WBC 7.9 4.6 - 10.2 K/uL   Lymph, poc 1.5 0.6 - 3.4   POC LYMPH PERCENT 18.8 10 - 50 %L   POC Granulocyte 6.2 2 - 6.9   Granulocyte percent 78.1 37 - 80 %G   RBC 5.43 4.04 - 5.48 M/uL   Hemoglobin 15.2 12.2 - 16.2 g/dL   HCT, POC 47.1 37.7 - 47.9 %   MCV 86.7 80 - 97 fL   MCH, POC 27.9 27 - 31.2 pg   MCHC 32.2 31.8 - 35.4 g/dL   RDW, POC 13.7 %   Platelet Count, POC 119.0 (A) 142 - 424 K/uL   MPV 10.4 0 - 99.8 fL     Assessment & Plan:   Denisa was seen today for diabetes.  Diagnoses and all orders for this visit:  Diabetes mellitus type  2, controlled Orders: -     POCT glycosylated hemoglobin (Hb A1C) -     CMP14+EGFR  Hyperlipemia Orders: -     CMP14+EGFR -     NMR, lipoprofile  Other fatigue Orders: -     POCT CBC -     Thyroid Panel With TSH -     Vit D  25 hydroxy (rtn osteoporosis monitoring)  Postmenopausal Orders: -     DG Bone Density   I have discontinued Ms. Scheerer's metFORMIN and escitalopram. I am also having her maintain her PHARMACIST CHOICE LANCETS and EMBRACE BLOOD GLUCOSE TEST.  No orders of the defined types were placed in this encounter.     Follow-up: Return in about 3 months (around 08/09/2014) for diabetes.  Claretta Fraise, M.D.

## 2014-05-10 ENCOUNTER — Other Ambulatory Visit: Payer: Self-pay | Admitting: Family Medicine

## 2014-05-10 LAB — NMR, LIPOPROFILE
CHOLESTEROL: 208 mg/dL — AB (ref 100–199)
HDL Cholesterol by NMR: 51 mg/dL (ref >39)
HDL Particle Number: 32.4 umol/L (ref >=30.5)
LDL PARTICLE NUMBER: 1443 nmol/L — AB (ref <1000)
LDL Size: 21.3 nm (ref >20.5)
LDL-C: 135 mg/dL — ABNORMAL HIGH (ref 0–99)
LP-IR Score: 43 (ref <=45)
Small LDL Particle Number: <90 nmol/L (ref <=527)
Triglycerides by NMR: 108 mg/dL (ref 0–149)

## 2014-05-10 LAB — CMP14+EGFR
A/G RATIO: 1.6 (ref 1.1–2.5)
ALBUMIN: 4.2 g/dL (ref 3.6–4.8)
ALT: 9 IU/L (ref 0–32)
AST: 12 IU/L (ref 0–40)
Alkaline Phosphatase: 115 IU/L (ref 39–117)
BILIRUBIN TOTAL: 0.3 mg/dL (ref 0.0–1.2)
BUN/Creatinine Ratio: 14 (ref 11–26)
BUN: 9 mg/dL (ref 8–27)
CO2: 21 mmol/L (ref 18–29)
CREATININE: 0.64 mg/dL (ref 0.57–1.00)
Calcium: 9.5 mg/dL (ref 8.7–10.3)
Chloride: 104 mmol/L (ref 97–108)
GFR, EST AFRICAN AMERICAN: 107 mL/min/{1.73_m2} (ref >59)
GFR, EST NON AFRICAN AMERICAN: 93 mL/min/{1.73_m2} (ref >59)
GLUCOSE: 195 mg/dL — AB (ref 65–99)
Globulin, Total: 2.6 g/dL (ref 1.5–4.5)
Potassium: 4.5 mmol/L (ref 3.5–5.2)
Sodium: 141 mmol/L (ref 134–144)
TOTAL PROTEIN: 6.8 g/dL (ref 6.0–8.5)

## 2014-05-10 LAB — THYROID PANEL WITH TSH
FREE THYROXINE INDEX: 1.8 (ref 1.2–4.9)
T3 Uptake Ratio: 25 % (ref 24–39)
T4, Total: 7.2 ug/dL (ref 4.5–12.0)
TSH: 1.65 u[IU]/mL (ref 0.450–4.500)

## 2014-05-10 LAB — VITAMIN D 25 HYDROXY (VIT D DEFICIENCY, FRACTURES): VIT D 25 HYDROXY: 16.9 ng/mL — AB (ref 30.0–100.0)

## 2014-05-10 MED ORDER — ATORVASTATIN CALCIUM 20 MG PO TABS: 20.0000 mg | ORAL_TABLET | Freq: Every day | ORAL | Status: DC

## 2014-05-10 MED ORDER — VITAMIN D (ERGOCALCIFEROL) 1.25 MG (50000 UNIT) PO CAPS: 50000.0000 [IU] | ORAL_CAPSULE | ORAL | Status: DC

## 2014-05-15 ENCOUNTER — Telehealth: Payer: Self-pay | Admitting: Family Medicine

## 2014-05-15 NOTE — Telephone Encounter (Signed)
-----   Message from Claretta Fraise, MD sent at 05/10/2014  5:41 PM EDT ----- Your cholesterol is too high. It would benefit from medication. Without treatment, risk for heart attack, stroke and other medical conditions is high. I recommend atorvastatin. 20 mg daily with supper should reduce your risk significantly.   Vitamin D quite low. Needs supplement.Use 50,000 units twice weekly for two months. Then switch to OTC Vitamin D 2000 units daily. REcehck vitamin D level with office visit in 6 months.

## 2014-05-16 NOTE — Telephone Encounter (Signed)
Message given to mitzi to call back.

## 2014-06-21 ENCOUNTER — Telehealth: Payer: Self-pay

## 2014-08-09 ENCOUNTER — Encounter: Payer: Self-pay | Admitting: Family Medicine

## 2014-08-09 ENCOUNTER — Ambulatory Visit (INDEPENDENT_AMBULATORY_CARE_PROVIDER_SITE_OTHER): Payer: Medicare PPO | Admitting: Family Medicine

## 2014-08-09 VITALS — BP 126/80 | HR 69 | Temp 97.1°F | Ht 66.0 in | Wt 182.0 lb

## 2014-08-09 DIAGNOSIS — E119 Type 2 diabetes mellitus without complications: Secondary | ICD-10-CM | POA: Diagnosis not present

## 2014-08-09 LAB — POCT GLYCOSYLATED HEMOGLOBIN (HGB A1C): Hemoglobin A1C: 6.7

## 2014-08-09 MED ORDER — FLUOCINONIDE-E 0.05 % EX CREA: 1.0000 "application " | TOPICAL_CREAM | Freq: Two times a day (BID) | CUTANEOUS | Status: DC

## 2014-08-09 NOTE — Patient Instructions (Addendum)
Need to obtain Diabetic shoes due to callusesDiabetes and Foot Care Diabetes may cause you to have problems because of poor blood supply (circulation) to your feet and legs. This may cause the skin on your feet to become thinner, break easier, and heal more slowly. Your skin may become dry, and the skin may peel and crack. You may also have nerve damage in your legs and feet causing decreased feeling in them. You may not notice minor injuries to your feet that could lead to infections or more serious problems. Taking care of your feet is one of the most important things you can do for yourself.  HOME CARE INSTRUCTIONS  Wear shoes at all times, even in the house. Do not go barefoot. Bare feet are easily injured.  Check your feet daily for blisters, cuts, and redness. If you cannot see the bottom of your feet, use a mirror or ask someone for help.  Wash your feet with warm water (do not use hot water) and mild soap. Then pat your feet and the areas between your toes until they are completely dry. Do not soak your feet as this can dry your skin.  Apply a moisturizing lotion or petroleum jelly (that does not contain alcohol and is unscented) to the skin on your feet and to dry, brittle toenails. Do not apply lotion between your toes.  Trim your toenails straight across. Do not dig under them or around the cuticle. File the edges of your nails with an emery board or nail file.  Do not cut corns or calluses or try to remove them with medicine.  Wear clean socks or stockings every day. Make sure they are not too tight. Do not wear knee-high stockings since they may decrease blood flow to your legs.  Wear shoes that fit properly and have enough cushioning. To break in new shoes, wear them for just a few hours a day. This prevents you from injuring your feet. Always look in your shoes before you put them on to be sure there are no objects inside.  Do not cross your legs. This may decrease the blood flow  to your feet.  If you find a minor scrape, cut, or break in the skin on your feet, keep it and the skin around it clean and dry. These areas may be cleansed with mild soap and water. Do not cleanse the area with peroxide, alcohol, or iodine.  When you remove an adhesive bandage, be sure not to damage the skin around it.  If you have a wound, look at it several times a day to make sure it is healing.  Do not use heating pads or hot water bottles. They may burn your skin. If you have lost feeling in your feet or legs, you may not know it is happening until it is too late.  Make sure your health care provider performs a complete foot exam at least annually or more often if you have foot problems. Report any cuts, sores, or bruises to your health care provider immediately. SEEK MEDICAL CARE IF:   You have an injury that is not healing.  You have cuts or breaks in the skin.  You have an ingrown nail.  You notice redness on your legs or feet.  You feel burning or tingling in your legs or feet.  You have pain or cramps in your legs and feet.  Your legs or feet are numb.  Your feet always feel cold. [place]  IF:   There is increasing redness, swelling, or pain in or around a wound.  There is a red line that goes up your leg.  Pus is coming from a wound.  You develop a fever or as directed by your health care provider.  You notice a bad smell coming from an ulcer or wound. Document Released: 01/31/2000 Document Revised: 10/05/2012 Document Reviewed: 07/12/2012 [facility] Patient Information 2015 [place], [place]. This information is not intended to replace advice given to you by your health care provider. Make sure you discuss any questions you have with your health care provider.

## 2014-08-09 NOTE — Progress Notes (Signed)
Subjective:  Patient ID: Debra Miranda, female    DOB: 14-Jun-1945  Age: 69 y.o. MRN: [id number]  CC: Diabetes   HPI ALLURE GREASER presents forFollow-up of diabetes. Patient does check blood sugar at home. Log reviewed, attached. REadings vary between 100-170 fasting. Up to 250 post prandial. Most in mid 100s PP Patient denies symptoms such as polyuria, polydipsia, excessive hunger, nausea No significant hypoglycemic spells noted. Medications as noted below. Taking them regularly without complication/adverse reaction being reported today.   History Avaeh has a past medical history of Diabetes mellitus without complication; Orthostatic hypotension; Allergy; and Depression.   She has past surgical history that includes Tonsillectomy and Lumbar disc surgery.   Her family history includes Alzheimer's disease in her brother; Cancer in her mother; Diabetes in her brother; Pulmonary fibrosis in her brother; Stroke in her father. There is no history of Colon cancer.She reports that she quit smoking about 4 years ago. She uses smokeless tobacco. She reports that she drinks alcohol. She reports that she does not use illicit drugs.  Current Outpatient Prescriptions on File Prior to Visit  Medication Sig Dispense Refill  . atorvastatin (LIPITOR) 20 MG tablet Take 1 tablet (20 mg total) by mouth daily. 90 tablet 3  . EMBRACE BLOOD GLUCOSE TEST test strip     . PHARMACIST CHOICE LANCETS MISC      No current facility-administered medications on file prior to visit.    ROS Review of Systems  Constitutional: Negative for fever, chills, diaphoresis, appetite change, fatigue and unexpected weight change.  HENT: Negative for congestion, ear pain, hearing loss, postnasal drip, rhinorrhea, sneezing, sore throat and trouble swallowing.   Eyes: Negative for pain.  Respiratory: Negative for cough, chest tightness and shortness of breath.   Cardiovascular: Negative for chest pain and palpitations.    Gastrointestinal: Negative for nausea, vomiting, abdominal pain, diarrhea and constipation.  Genitourinary: Negative for dysuria, frequency and menstrual problem.  Musculoskeletal: Negative for joint swelling and arthralgias.  Skin: Negative for rash.       Lesions on legs break out, itch, resolve after several days.Occur 1-2 at a time, below knees bilaterally.  Neurological: Negative for dizziness, weakness, numbness and headaches.  Psychiatric/Behavioral: Negative for dysphoric mood and agitation.    Objective:  BP 126/80 mmHg  Pulse 69  Temp(Src) 97.1 F (36.2 C) (Oral)  Ht 5\' 6"  (1.676 m)  Wt 182 lb (82.555 kg)  BMI 29.39 kg/m2  BP Readings from Last 3 Encounters:  08/09/14 126/80  05/09/14 134/81  09/12/13 120/74    Wt Readings from Last 3 Encounters:  08/09/14 182 lb (82.555 kg)  05/09/14 190 lb (86.183 kg)  09/12/13 170 lb 6.4 oz (77.293 kg)     Physical Exam  Constitutional: She is oriented to person, place, and time. She appears well-developed and well-nourished. No distress.  HENT:  Head: Normocephalic and atraumatic.  Right Ear: External ear normal.  Left Ear: External ear normal.  Nose: Nose normal.  Mouth/Throat: Oropharynx is clear and moist.  Eyes: Conjunctivae and EOM are normal. Pupils are equal, round, and reactive to light.  Neck: Normal range of motion. Neck supple. No thyromegaly present.  Cardiovascular: Normal rate, regular rhythm and normal heart sounds.  Exam reveals no friction rub.   No murmur heard. Pulmonary/Chest: Effort normal and breath sounds normal. No respiratory distress. She has no wheezes. She has no rales.  Abdominal: Soft. Bowel sounds are normal. She exhibits no distension. There is no tenderness.  Lymphadenopathy:  She has no cervical adenopathy.  Neurological: She is alert and oriented to person, place, and time. She has normal reflexes.  Skin: Skin is warm and dry.  Few lesions with excoriations on lower legs 3-5 mm  each. Slightly raised. Light brown color  Psychiatric: She has a normal mood and affect. Her behavior is normal. Judgment and thought content normal.    Lab Results  Component Value Date   HGBA1C 6.7 08/09/2014   HGBA1C 8.0% 05/09/2014   HGBA1C 6.3% 09/13/2013    Lab Results  Component Value Date   WBC 7.9 05/09/2014   HGB 15.2 05/09/2014   HCT 47.1 05/09/2014   GLUCOSE 195* 05/09/2014   CHOL 208* 05/09/2014   TRIG 108 05/09/2014   HDL 51 05/09/2014   LDLCALC 121* 09/13/2013   ALT 9 05/09/2014   AST 12 05/09/2014   NA 141 05/09/2014   K 4.5 05/09/2014   CL 104 05/09/2014   CREATININE 0.64 05/09/2014   BUN 9 05/09/2014   CO2 21 05/09/2014   TSH 1.650 05/09/2014   HGBA1C 6.7 08/09/2014     Assessment & Plan:   Geroldine was seen today for diabetes.  Diagnoses and all orders for this visit:  Diabetes mellitus type 2, controlled Orders: -     POCT glycosylated hemoglobin (Hb A1C)  Other orders -     fluocinonide-emollient (LIDEX-E) 0.05 % cream; Apply 1 application topically 2 (two) times daily.   I have discontinued Ms. Auten's Vitamin D (Ergocalciferol). I am also having her start on fluocinonide-emollient. Additionally, I am having her maintain her PHARMACIST CHOICE LANCETS, EMBRACE BLOOD GLUCOSE TEST, atorvastatin, and Vitamin D.  Meds ordered this encounter  Medications  . Cholecalciferol (VITAMIN D) 2000 UNITS CAPS    Sig: Take 2,000 Units by mouth daily.  . fluocinonide-emollient (LIDEX-E) 0.05 % cream    Sig: Apply 1 application topically 2 (two) times daily.    Dispense:  60 g    Refill:  5     Follow-up: Return in about 3 months (around 11/09/2014).  Claretta Fraise, M.D.

## 2014-08-13 ENCOUNTER — Other Ambulatory Visit: Payer: Self-pay

## 2014-08-16 ENCOUNTER — Other Ambulatory Visit: Payer: Self-pay

## 2014-08-16 MED ORDER — METFORMIN HCL 500 MG PO TABS: 500.0000 mg | ORAL_TABLET | Freq: Two times a day (BID) | ORAL | Status: DC

## 2014-08-16 NOTE — Telephone Encounter (Signed)
Last seen 08/09/14  Dr Livia Snellen  This med not on EPIC list

## 2014-09-11 ENCOUNTER — Telehealth: Payer: Self-pay | Admitting: Family Medicine

## 2014-09-11 DIAGNOSIS — F329 Major depressive disorder, single episode, unspecified: Secondary | ICD-10-CM

## 2014-09-11 DIAGNOSIS — F32A Depression, unspecified: Secondary | ICD-10-CM

## 2014-09-11 MED ORDER — ESCITALOPRAM OXALATE 10 MG PO TABS: 10.0000 mg | ORAL_TABLET | Freq: Every day | ORAL | Status: DC

## 2014-09-11 NOTE — Telephone Encounter (Signed)
Done, tell pt.

## 2014-09-11 NOTE — Telephone Encounter (Signed)
Pt notified of RX 

## 2014-09-11 NOTE — Telephone Encounter (Signed)
Please refill her lexapro.   When she is taking it she feels more like walking,  going and doing things.   This helps her back and legs to feel better.  She just had a visit with you but did not remember to ask for the refill.

## 2014-11-12 ENCOUNTER — Other Ambulatory Visit: Payer: Self-pay | Admitting: Family Medicine

## 2014-11-12 ENCOUNTER — Ambulatory Visit (INDEPENDENT_AMBULATORY_CARE_PROVIDER_SITE_OTHER): Payer: Medicare PPO | Admitting: Family Medicine

## 2014-11-12 ENCOUNTER — Encounter: Payer: Self-pay | Admitting: Family Medicine

## 2014-11-12 VITALS — BP 135/79 | HR 66 | Temp 97.9°F | Ht 66.0 in | Wt 188.0 lb

## 2014-11-12 DIAGNOSIS — E785 Hyperlipidemia, unspecified: Secondary | ICD-10-CM | POA: Diagnosis not present

## 2014-11-12 DIAGNOSIS — E119 Type 2 diabetes mellitus without complications: Secondary | ICD-10-CM | POA: Diagnosis not present

## 2014-11-12 DIAGNOSIS — Z23 Encounter for immunization: Secondary | ICD-10-CM

## 2014-11-12 LAB — POCT UA - MICROALBUMIN: Microalbumin Ur, POC: NEGATIVE mg/L

## 2014-11-12 LAB — POCT GLYCOSYLATED HEMOGLOBIN (HGB A1C): HEMOGLOBIN A1C: 7.1

## 2014-11-12 MED ORDER — BLOOD GLUCOSE MONITOR KIT: PACK | Status: DC

## 2014-11-12 NOTE — Progress Notes (Signed)
Subjective:  Patient ID: Debra Miranda, female    DOB: [birth year]  Age: 69 y.o. MRN: [id number]  CC: Hyperlipidemia and Diabetes   HPI Debra Miranda presents for  follow-up of hypertension. Patient has no history of headache chest pain or shortness of breath or recent cough. Patient also denies symptoms of TIA such as numbness weakness lateralizing. Patient checks  blood pressure at home and has not had any elevated readings recently. Patient denies side effects from his medication. States taking it regularly.  Patient also  in for follow-up of elevated cholesterol. Doing well without complaints on current medication. Denies side effects of statin including myalgia and arthralgia and nausea. Also in today for liver function testing. Currently no chest pain, shortness of breath or other cardiovascular related symptoms noted.  Follow-up of diabetes. Patient does not check blood sugar at home. Patient denies symptoms such as polyuria, polydipsia, excessive hunger, nausea No significant hypoglycemic spells noted. Medications as noted below. Taking them regularly without complication/adverse reaction being reported today.    History Debra Miranda has a past medical history of Diabetes mellitus without complication; Orthostatic hypotension; Allergy; and Depression.   She has past surgical history that includes Tonsillectomy and Lumbar disc surgery.   Her family history includes Alzheimer's disease in her brother; Cancer in her mother; Diabetes in her brother; Pulmonary fibrosis in her brother; Stroke in her father. There is no history of Colon cancer.She reports that she quit smoking about 4 years ago. She uses smokeless tobacco. She reports that she drinks alcohol. She reports that she does not use illicit drugs.  Current Outpatient Prescriptions on File Prior to Visit  Medication Sig Dispense Refill  . atorvastatin (LIPITOR) 20 MG tablet Take 1 tablet (20 mg total) by mouth daily. 90 tablet 3  .  Cholecalciferol (VITAMIN D) 2000 UNITS CAPS Take 2,000 Units by mouth daily.    Marland Kitchen escitalopram (LEXAPRO) 10 MG tablet Take 1 tablet (10 mg total) by mouth daily. 30 tablet 2  . fluocinonide-emollient (LIDEX-E) 0.05 % cream Apply 1 application topically 2 (two) times daily. 60 g 5  . metFORMIN (GLUCOPHAGE) 500 MG tablet Take 1 tablet (500 mg total) by mouth 2 (two) times daily with a meal. 180 tablet 0   No current facility-administered medications on file prior to visit.    ROS Review of Systems  Constitutional: Negative for fever, chills, diaphoresis, appetite change, fatigue and unexpected weight change.  HENT: Negative for congestion, ear pain, hearing loss, postnasal drip, rhinorrhea, sneezing, sore throat and trouble swallowing.   Eyes: Negative for pain.  Respiratory: Negative for cough, chest tightness and shortness of breath.   Cardiovascular: Negative for chest pain and palpitations.  Gastrointestinal: Negative for nausea, vomiting, abdominal pain, diarrhea and constipation.  Genitourinary: Negative for dysuria, frequency and menstrual problem.  Musculoskeletal: Negative for joint swelling and arthralgias.  Skin: Negative for rash.  Neurological: Negative for dizziness, weakness, numbness and headaches.  Psychiatric/Behavioral: Negative for dysphoric mood and agitation.    Objective:  BP 135/79 mmHg  Pulse 66  Temp(Src) 97.9 F (36.6 C) (Oral)  Ht _0  (1.676 m)  Wt 188 lb (85.276 kg)  BMI 30.36 kg/m2  BP Readings from Last 3 Encounters:  11/12/14 135/79  08/09/14 126/80  05/09/14 134/81    Wt Readings from Last 3 Encounters:  11/12/14 188 lb (85.276 kg)  08/09/14 182 lb (82.555 kg)  05/09/14 190 lb (86.183 kg)     Physical Exam  Constitutional: She is oriented  to person, place, and time. She appears well-developed and well-nourished. No distress.  HENT:  Head: Normocephalic and atraumatic.  Right Ear: External ear normal.  Left Ear: External ear normal.    Nose: Nose normal.  Mouth/Throat: Oropharynx is clear and moist.  Eyes: Conjunctivae and EOM are normal. Pupils are equal, round, and reactive to light.  Neck: Normal range of motion. Neck supple. No thyromegaly present.  Cardiovascular: Normal rate, regular rhythm and normal heart sounds.   No murmur heard. Pulmonary/Chest: Effort normal and breath sounds normal. No respiratory distress. She has no wheezes. She has no rales.  Abdominal: Soft. Bowel sounds are normal. She exhibits no distension. There is no tenderness.  Lymphadenopathy:    She has no cervical adenopathy.  Neurological: She is alert and oriented to person, place, and time. She has normal reflexes.  Skin: Skin is warm and dry.  Psychiatric: She has a normal mood and affect. Her behavior is normal. Judgment and thought content normal.    Lab Results  Component Value Date   HGBA1C 7.1 11/12/2014   HGBA1C 6.7 08/09/2014   HGBA1C 8.0% 05/09/2014    Lab Results  Component Value Date   WBC 7.9 05/09/2014   HGB 15.2 05/09/2014   HCT 47.1 05/09/2014   GLUCOSE 195* 05/09/2014   CHOL 208* 05/09/2014   TRIG 108 05/09/2014   HDL 51 05/09/2014   LDLCALC 121* 09/13/2013   ALT 9 05/09/2014   AST 12 05/09/2014   NA 141 05/09/2014   K 4.5 05/09/2014   CL 104 05/09/2014   CREATININE 0.64 05/09/2014   BUN 9 05/09/2014   CO2 21 05/09/2014   TSH 1.650 05/09/2014   HGBA1C 7.1 11/12/2014    No results found.  Assessment & Plan:   Debra Miranda was seen today for hyperlipidemia and diabetes.  Diagnoses and all orders for this visit:  Hyperlipemia -     Lipid panel -     CBC with Differential/Platelet  Diabetes type 2, controlled -     POCT glycosylated hemoglobin (Hb A1C) -     CMP14+EGFR -     POCT UA - Microalbumin -     CBC with Differential/Platelet  Encounter for immunization  Other orders -     blood glucose meter kit and supplies KIT; Dispense based on patient and insurance preference. Use up to four times  daily as directed. (FOR ICD-9 250.00, 250.01). -     Flu Vaccine QUAD 36+ mos IM   I have discontinued Debra Miranda's PHARMACIST CHOICE LANCETS and EMBRACE BLOOD GLUCOSE TEST. I am also having her start on blood glucose meter kit and supplies. Additionally, I am having her maintain her atorvastatin, Vitamin D, fluocinonide-emollient, metFORMIN, and escitalopram.  Meds ordered this encounter  Medications  . blood glucose meter kit and supplies KIT    Sig: Dispense based on patient and insurance preference. Use up to four times daily as directed. (FOR ICD-9 250.00, 250.01).    Dispense:  1 each    Refill:  0    Order Specific Question:  Number of strips    Answer:  100    Order Specific Question:  Number of lancets    Answer:  100     Follow-up: Return in about 3 months (around 02/11/2015) for diabetes.  Claretta Fraise, M.D.

## 2014-11-13 LAB — CMP14+EGFR
A/G RATIO: 1.8 (ref 1.1–2.5)
ALBUMIN: 4.4 g/dL (ref 3.6–4.8)
ALT: 12 IU/L (ref 0–32)
AST: 13 IU/L (ref 0–40)
Alkaline Phosphatase: 104 IU/L (ref 39–117)
BUN / CREAT RATIO: 14 (ref 11–26)
BUN: 9 mg/dL (ref 8–27)
Bilirubin Total: 0.5 mg/dL (ref 0.0–1.2)
CALCIUM: 9.5 mg/dL (ref 8.7–10.3)
CO2: 18 mmol/L (ref 18–29)
CREATININE: 0.66 mg/dL (ref 0.57–1.00)
Chloride: 100 mmol/L (ref 97–108)
GFR calc Af Amer: 106 mL/min/{1.73_m2} (ref >59)
GFR, EST NON AFRICAN AMERICAN: 92 mL/min/{1.73_m2} (ref >59)
GLOBULIN, TOTAL: 2.4 g/dL (ref 1.5–4.5)
Glucose: 130 mg/dL — ABNORMAL HIGH (ref 65–99)
POTASSIUM: 4.5 mmol/L (ref 3.5–5.2)
SODIUM: 138 mmol/L (ref 134–144)
TOTAL PROTEIN: 6.8 g/dL (ref 6.0–8.5)

## 2014-11-13 LAB — CBC WITH DIFFERENTIAL/PLATELET
BASOS ABS: 0 10*3/uL (ref 0.0–0.2)
Basos: 0 %
EOS (ABSOLUTE): 0.1 10*3/uL (ref 0.0–0.4)
Eos: 1 %
HEMOGLOBIN: 15.8 g/dL (ref 11.1–15.9)
Hematocrit: 46.6 % (ref 34.0–46.6)
IMMATURE GRANS (ABS): 0 10*3/uL (ref 0.0–0.1)
Immature Granulocytes: 0 %
LYMPHS: 24 %
Lymphocytes Absolute: 1.8 10*3/uL (ref 0.7–3.1)
MCH: 29.5 pg (ref 26.6–33.0)
MCHC: 33.9 g/dL (ref 31.5–35.7)
MCV: 87 fL (ref 79–97)
MONOCYTES: 8 %
Monocytes Absolute: 0.6 10*3/uL (ref 0.1–0.9)
NEUTROS ABS: 5 10*3/uL (ref 1.4–7.0)
Neutrophils: 67 %
PLATELETS: 149 10*3/uL — AB (ref 150–379)
RBC: 5.35 x10E6/uL — AB (ref 3.77–5.28)
RDW: 14.1 % (ref 12.3–15.4)
WBC: 7.4 10*3/uL (ref 3.4–10.8)

## 2014-11-13 LAB — LIPID PANEL
CHOL/HDL RATIO: 2.5 ratio (ref 0.0–4.4)
Cholesterol, Total: 142 mg/dL (ref 100–199)
HDL: 57 mg/dL (ref >39)
LDL CALC: 62 mg/dL (ref 0–99)
Triglycerides: 113 mg/dL (ref 0–149)
VLDL Cholesterol Cal: 23 mg/dL (ref 5–40)

## 2014-11-15 ENCOUNTER — Encounter: Payer: Self-pay | Admitting: Family Medicine

## 2014-11-28 ENCOUNTER — Other Ambulatory Visit: Payer: Self-pay | Admitting: Family Medicine

## 2015-02-09 ENCOUNTER — Other Ambulatory Visit: Payer: Self-pay | Admitting: Family Medicine

## 2015-02-12 ENCOUNTER — Ambulatory Visit: Payer: Medicare PPO | Admitting: Family Medicine

## 2015-02-28 ENCOUNTER — Other Ambulatory Visit: Payer: Self-pay | Admitting: Family Medicine

## 2015-03-12 ENCOUNTER — Encounter: Payer: Self-pay | Admitting: Family Medicine

## 2015-03-12 ENCOUNTER — Ambulatory Visit (INDEPENDENT_AMBULATORY_CARE_PROVIDER_SITE_OTHER): Payer: Medicare PPO | Admitting: Family Medicine

## 2015-03-12 VITALS — BP 138/85 | HR 78 | Temp 97.8°F | Ht 66.0 in | Wt 196.0 lb

## 2015-03-12 DIAGNOSIS — E119 Type 2 diabetes mellitus without complications: Secondary | ICD-10-CM | POA: Diagnosis not present

## 2015-03-12 LAB — POCT GLYCOSYLATED HEMOGLOBIN (HGB A1C): HEMOGLOBIN A1C: 7.5

## 2015-03-12 MED ORDER — ATORVASTATIN CALCIUM 20 MG PO TABS: 20.0000 mg | ORAL_TABLET | Freq: Every day | ORAL | Status: DC

## 2015-03-12 MED ORDER — METFORMIN HCL 500 MG PO TABS: ORAL_TABLET | ORAL | Status: DC

## 2015-03-12 MED ORDER — ESCITALOPRAM OXALATE 10 MG PO TABS: 10.0000 mg | ORAL_TABLET | Freq: Every day | ORAL | Status: DC

## 2015-03-12 NOTE — Progress Notes (Signed)
Subjective:  Patient ID: Debra Miranda, female    DOB: 11-Sep-1945  Age: 70 y.o. MRN: [id number]  CC: Diabetes and Hyperlipidemia   HPI [name] presents forFollow-up of diabetes. Patient checks blood sugar at home.   120 fasting and 175 postprandial Patient denies symptoms such as polyuria, polydipsia, excessive hunger, nausea No significant hypoglycemic spells noted. Medications as noted below. Taking them regularly without complication/adverse reaction being reported today. Checking feet daily. Last eye appt was unknown Depression screen [facility] 2/9 03/12/2015 11/12/2014 05/09/2014 09/12/2013  Decreased Interest 0 0 0 0  Down, Depressed, Hopeless 0 0 0 0  PHQ - 2 Score 0 0 0 0    No flowsheet data found.     History Debra Miranda has a past medical history of Diabetes mellitus without complication ([place]); Orthostatic hypotension; Allergy; and Depression.   She has past surgical history that includes Tonsillectomy and Lumbar disc surgery.   Her family history includes Alzheimer's disease in her brother; Cancer in her mother; Diabetes in her brother; Pulmonary fibrosis in her brother; Stroke in her father. There is no history of Colon cancer.She reports that she quit smoking about 4 years ago. She uses smokeless tobacco. She reports that she drinks alcohol. She reports that she does not use illicit drugs.  Current Outpatient Prescriptions on File Prior to Visit  Medication Sig Dispense Refill  . Cholecalciferol (VITAMIN D) 2000 UNITS CAPS Take 2,000 Units by mouth daily.     No current facility-administered medications on file prior to visit.    ROS Review of Systems  Objective:  BP 138/85 mmHg  Pulse 78  Temp(Src) 97.8 F (36.6 C) (Oral)  Ht _0  (1.676 m)  Wt 196 lb (88.905 kg)  BMI 31.65 kg/m2  SpO2 97%  BP Readings from Last 3 Encounters:  03/12/15 138/85  11/12/14 135/79  08/09/14 126/80    Wt Readings from Last 3 Encounters:  03/12/15 196 lb (88.905 kg)    11/12/14 188 lb (85.276 kg)  08/09/14 182 lb (82.555 kg)     Physical Exam  Lab Results  Component Value Date   HGBA1C 7.5 03/12/2015   HGBA1C 7.1 11/12/2014   HGBA1C 6.7 08/09/2014    Lab Results  Component Value Date   WBC 7.4 11/12/2014   HGB 15.2 05/09/2014   HCT 46.6 11/12/2014   PLT 149* 11/12/2014   GLUCOSE 130* 11/12/2014   CHOL 142 11/12/2014   TRIG 113 11/12/2014   HDL 57 11/12/2014   LDLCALC 62 11/12/2014   ALT 12 11/12/2014   AST 13 11/12/2014   NA 138 11/12/2014   K 4.5 11/12/2014   CL 100 11/12/2014   CREATININE 0.66 11/12/2014   BUN 9 11/12/2014   CO2 18 11/12/2014   TSH 1.650 05/09/2014   HGBA1C 7.5 03/12/2015     Assessment & Plan:   Milderd was seen today for diabetes and hyperlipidemia.  Diagnoses and all orders for this visit:  Controlled type 2 diabetes mellitus without complication, without long-term current use of insulin (HCC) -     POCT glycosylated hemoglobin (Hb A1C) -     CMP14+EGFR -     Lipid panel -     Microalbumin / creatinine urine ratio  Other orders -     escitalopram (LEXAPRO) 10 MG tablet; Take 1 tablet (10 mg total) by mouth daily. -     atorvastatin (LIPITOR) 20 MG tablet; Take 1 tablet (20 mg total) by mouth daily. -  metFORMIN (GLUCOPHAGE) 500 MG tablet; TAKE 1 TABLET (500 MG TOTAL)   BY MOUTH 2 (TWO) TIMES DAILY WITH A MEAL .    Patient tolerating meds well no complaints today continue meds as is    Meds ordered this encounter  Medications  . escitalopram (LEXAPRO) 10 MG tablet    Sig: Take 1 tablet (10 mg total) by mouth daily.    Dispense:  90 tablet    Refill:  1  . atorvastatin (LIPITOR) 20 MG tablet    Sig: Take 1 tablet (20 mg total) by mouth daily.    Dispense:  90 tablet    Refill:  1  . metFORMIN (GLUCOPHAGE) 500 MG tablet    Sig: TAKE 1 TABLET (500 MG TOTAL)   BY MOUTH 2 (TWO) TIMES DAILY WITH A MEAL .    Dispense:  180 tablet    Refill:  1     Follow-up: Return in about 6 months  (around 09/09/2015) for diabetes.  Claretta Fraise, M.D.

## 2015-03-13 LAB — CMP14+EGFR
ALT: 10 IU/L (ref 0–32)
AST: 15 IU/L (ref 0–40)
Albumin/Globulin Ratio: 2 (ref 1.1–2.5)
Albumin: 4.3 g/dL (ref 3.6–4.8)
Alkaline Phosphatase: 93 IU/L (ref 39–117)
BUN/Creatinine Ratio: 15 (ref 11–26)
BUN: 8 mg/dL (ref 8–27)
Bilirubin Total: 0.4 mg/dL (ref 0.0–1.2)
CALCIUM: 9.2 mg/dL (ref 8.7–10.3)
CO2: 20 mmol/L (ref 18–29)
Chloride: 101 mmol/L (ref 96–106)
Creatinine, Ser: 0.53 mg/dL — ABNORMAL LOW (ref 0.57–1.00)
GFR, EST AFRICAN AMERICAN: 114 mL/min/{1.73_m2} (ref >59)
GFR, EST NON AFRICAN AMERICAN: 99 mL/min/{1.73_m2} (ref >59)
GLUCOSE: 116 mg/dL — AB (ref 65–99)
Globulin, Total: 2.2 g/dL (ref 1.5–4.5)
Potassium: 4.4 mmol/L (ref 3.5–5.2)
Sodium: 139 mmol/L (ref 134–144)
TOTAL PROTEIN: 6.5 g/dL (ref 6.0–8.5)

## 2015-03-13 LAB — LIPID PANEL
CHOL/HDL RATIO: 2.3 ratio (ref 0.0–4.4)
Cholesterol, Total: 124 mg/dL (ref 100–199)
HDL: 53 mg/dL (ref >39)
LDL Calculated: 48 mg/dL (ref 0–99)
Triglycerides: 114 mg/dL (ref 0–149)
VLDL CHOLESTEROL CAL: 23 mg/dL (ref 5–40)

## 2015-09-04 ENCOUNTER — Telehealth: Payer: Self-pay | Admitting: Family Medicine

## 2015-09-06 ENCOUNTER — Other Ambulatory Visit: Payer: Medicare PPO

## 2015-09-06 DIAGNOSIS — R5383 Other fatigue: Secondary | ICD-10-CM | POA: Diagnosis not present

## 2015-09-06 DIAGNOSIS — E785 Hyperlipidemia, unspecified: Secondary | ICD-10-CM

## 2015-09-06 DIAGNOSIS — E119 Type 2 diabetes mellitus without complications: Secondary | ICD-10-CM | POA: Diagnosis not present

## 2015-09-06 LAB — BAYER DCA HB A1C WAIVED: HB A1C (BAYER DCA - WAIVED): 9.8 % — ABNORMAL HIGH (ref <7.0)

## 2015-09-07 LAB — LIPID PANEL
CHOL/HDL RATIO: 2.6 ratio (ref 0.0–4.4)
Cholesterol, Total: 135 mg/dL (ref 100–199)
HDL: 52 mg/dL (ref >39)
LDL CALC: 56 mg/dL (ref 0–99)
TRIGLYCERIDES: 135 mg/dL (ref 0–149)
VLDL Cholesterol Cal: 27 mg/dL (ref 5–40)

## 2015-09-07 LAB — CBC WITH DIFFERENTIAL/PLATELET
BASOS: 1 %
Basophils Absolute: 0 10*3/uL (ref 0.0–0.2)
EOS (ABSOLUTE): 0.1 10*3/uL (ref 0.0–0.4)
Eos: 1 %
Hematocrit: 46.7 % — ABNORMAL HIGH (ref 34.0–46.6)
Hemoglobin: 15.6 g/dL (ref 11.1–15.9)
IMMATURE GRANULOCYTES: 0 %
Immature Grans (Abs): 0 10*3/uL (ref 0.0–0.1)
LYMPHS ABS: 1.5 10*3/uL (ref 0.7–3.1)
Lymphs: 18 %
MCH: 29.6 pg (ref 26.6–33.0)
MCHC: 33.4 g/dL (ref 31.5–35.7)
MCV: 89 fL (ref 79–97)
MONOS ABS: 0.5 10*3/uL (ref 0.1–0.9)
Monocytes: 7 %
NEUTROS ABS: 5.9 10*3/uL (ref 1.4–7.0)
NEUTROS PCT: 73 %
PLATELETS: 142 10*3/uL — AB (ref 150–379)
RBC: 5.27 x10E6/uL (ref 3.77–5.28)
RDW: 13.6 % (ref 12.3–15.4)
WBC: 8 10*3/uL (ref 3.4–10.8)

## 2015-09-07 LAB — CMP14+EGFR
A/G RATIO: 1.7 (ref 1.2–2.2)
ALK PHOS: 117 IU/L (ref 39–117)
ALT: 9 IU/L (ref 0–32)
AST: 10 IU/L (ref 0–40)
Albumin: 4.3 g/dL (ref 3.6–4.8)
BILIRUBIN TOTAL: 0.4 mg/dL (ref 0.0–1.2)
BUN/Creatinine Ratio: 13 (ref 12–28)
BUN: 8 mg/dL (ref 8–27)
CO2: 20 mmol/L (ref 18–29)
CREATININE: 0.6 mg/dL (ref 0.57–1.00)
Calcium: 9.3 mg/dL (ref 8.7–10.3)
Chloride: 101 mmol/L (ref 96–106)
GFR, EST AFRICAN AMERICAN: 108 mL/min/{1.73_m2} (ref >59)
GFR, EST NON AFRICAN AMERICAN: 94 mL/min/{1.73_m2} (ref >59)
Globulin, Total: 2.5 g/dL (ref 1.5–4.5)
Glucose: 301 mg/dL — ABNORMAL HIGH (ref 65–99)
POTASSIUM: 4.6 mmol/L (ref 3.5–5.2)
SODIUM: 139 mmol/L (ref 134–144)
Total Protein: 6.8 g/dL (ref 6.0–8.5)

## 2015-09-09 ENCOUNTER — Ambulatory Visit (INDEPENDENT_AMBULATORY_CARE_PROVIDER_SITE_OTHER): Payer: Medicare PPO | Admitting: Family Medicine

## 2015-09-09 VITALS — BP 127/72 | HR 72 | Temp 97.5°F | Ht 66.0 in | Wt 196.2 lb

## 2015-09-09 DIAGNOSIS — E785 Hyperlipidemia, unspecified: Secondary | ICD-10-CM

## 2015-09-09 DIAGNOSIS — E119 Type 2 diabetes mellitus without complications: Secondary | ICD-10-CM | POA: Diagnosis not present

## 2015-09-09 DIAGNOSIS — F32A Depression, unspecified: Secondary | ICD-10-CM | POA: Insufficient documentation

## 2015-09-09 DIAGNOSIS — F329 Major depressive disorder, single episode, unspecified: Secondary | ICD-10-CM

## 2015-09-09 DIAGNOSIS — E1169 Type 2 diabetes mellitus with other specified complication: Secondary | ICD-10-CM | POA: Insufficient documentation

## 2015-09-09 LAB — BAYER DCA HB A1C WAIVED: HB A1C (BAYER DCA - WAIVED): 9.5 % — ABNORMAL HIGH (ref <7.0)

## 2015-09-09 NOTE — Progress Notes (Signed)
Subjective:  Patient ID: Debra Miranda, female    DOB: 1946/02/15  Age: 70 y.o. MRN: [id number]  CC: Diabetes (3 mth rck) and Hyperlipidemia   HPI Debra Miranda presents for  follow-up of hypertension. Patient has no history of headache chest pain or shortness of breath or recent cough. Patient also denies symptoms of TIA such as numbness weakness lateralizing. Patient checks  blood pressure at home and has not had any elevated readings recently. Patient denies side effects from his medication. States taking it regularly.  Patient also  in for follow-up of elevated cholesterol. Doing well without complaints on current medication. Denies side effects of statin including myalgia and arthralgia and nausea. Also in today for liver function testing. Currently no chest pain, shortness of breath or other cardiovascular related symptoms noted.  Follow-up of diabetes. Patient does not check blood sugar at home.Patient denies symptoms such as polyuria, polydipsia, excessive hunger, nausea No significant hypoglycemic spells noted. Medications as noted below. Taking them regularly without complication/adverse reaction being reported today.    History Debra Miranda has a past medical history of Allergy; Depression; Diabetes mellitus without complication ([name]); and Orthostatic hypotension.   Debra Miranda has a past surgical history that includes Tonsillectomy and Lumbar disc surgery.   Her family history includes Alzheimer's disease in her brother; Cancer in her mother; Diabetes in her brother; Pulmonary fibrosis in her brother; Stroke in her father.Debra Miranda reports that Debra Miranda quit smoking about 5 years ago. Debra Miranda quit after 45.00 years of use. Debra Miranda uses smokeless tobacco. Debra Miranda reports that Debra Miranda drinks alcohol. Debra Miranda reports that Debra Miranda does not use drugs.  Current Outpatient Prescriptions on File Prior to Visit  Medication Sig Dispense Refill  . atorvastatin (LIPITOR) 20 MG tablet Take 1 tablet (20 mg total) by mouth daily. 90 tablet  1  . Cholecalciferol (VITAMIN D) 2000 UNITS CAPS Take 2,000 Units by mouth daily.    Marland Kitchen escitalopram (LEXAPRO) 10 MG tablet Take 1 tablet (10 mg total) by mouth daily. 90 tablet 1  . metFORMIN (GLUCOPHAGE) 500 MG tablet TAKE 1 TABLET (500 MG TOTAL)   BY MOUTH 2 (TWO) TIMES DAILY WITH A MEAL . 180 tablet 1   No current facility-administered medications on file prior to visit.     ROS Review of Systems  Constitutional: Negative for activity change, appetite change and fever.  HENT: Negative for congestion, rhinorrhea and sore throat.   Eyes: Negative for visual disturbance.  Respiratory: Negative for cough and shortness of breath.   Cardiovascular: Negative for chest pain and palpitations.  Gastrointestinal: Negative for abdominal pain, diarrhea and nausea.  Genitourinary: Negative for dysuria.  Musculoskeletal: Negative for arthralgias and myalgias.    Objective:  BP 127/72 (BP Location: Right Arm, Patient Position: Sitting, Cuff Size: Large)   Pulse 72   Temp 97.5 F (36.4 C) (Oral)   Ht 5\' 6"  (1.676 m)   Wt 196 lb 3.2 oz (89 kg)   SpO2 96%   BMI 31.67 kg/m   BP Readings from Last 3 Encounters:  09/09/15 127/72  03/12/15 138/85  11/12/14 135/79    Wt Readings from Last 3 Encounters:  09/09/15 196 lb 3.2 oz (89 kg)  03/12/15 196 lb (88.9 kg)  11/12/14 188 lb (85.3 kg)     Physical Exam  Constitutional: Debra Miranda is oriented to person, place, and time. Debra Miranda appears well-developed and well-nourished. No distress.  HENT:  Head: Normocephalic and atraumatic.  Eyes: Conjunctivae are normal. Pupils are equal, round, and reactive to  light.  Neck: Normal range of motion. Neck supple. No thyromegaly present.  Cardiovascular: Normal rate, regular rhythm and normal heart sounds.   No murmur heard. Pulmonary/Chest: Effort normal and breath sounds normal. No respiratory distress. Debra Miranda has no wheezes. Debra Miranda has no rales.  Abdominal: Soft. Bowel sounds are normal. Debra Miranda exhibits no  distension. There is no tenderness.  Musculoskeletal: Normal range of motion.  Lymphadenopathy:    Debra Miranda has no cervical adenopathy.  Neurological: Debra Miranda is alert and oriented to person, place, and time.  Skin: Skin is warm and dry.  Psychiatric: Debra Miranda has a normal mood and affect. Her behavior is normal. Judgment and thought content normal.    Lab Results  Component Value Date   HGBA1C 7.5 03/12/2015   HGBA1C 7.1 11/12/2014   HGBA1C 6.7 08/09/2014    Lab Results  Component Value Date   WBC 8.0 09/06/2015   HGB 15.2 05/09/2014   HCT 46.7 (H) 09/06/2015   PLT 142 (L) 09/06/2015   GLUCOSE 301 (H) 09/06/2015   CHOL 135 09/06/2015   TRIG 135 09/06/2015   HDL 52 09/06/2015   LDLCALC 56 09/06/2015   ALT 9 09/06/2015   AST 10 09/06/2015   NA 139 09/06/2015   K 4.6 09/06/2015   CL 101 09/06/2015   CREATININE 0.60 09/06/2015   BUN 8 09/06/2015   CO2 20 09/06/2015   TSH 1.650 05/09/2014   HGBA1C 7.5 03/12/2015    No results found.  Assessment & Plan:   Debra was seen today for diabetes and hyperlipidemia.  Diagnoses and all orders for this visit:  Controlled type 2 diabetes mellitus without complication, without long-term current use of insulin (HCC) -     Bayer DCA Hb A1c Waived  Hyperlipemia  Depression   I am having Debra Miranda maintain her Vitamin D, escitalopram, atorvastatin, and metFORMIN.  No orders of the defined types were placed in this encounter.    Follow-up: Return in about 3 months (around 12/10/2015) for diabetes.  Claretta Fraise, M.D.

## 2015-09-23 ENCOUNTER — Telehealth: Payer: Self-pay | Admitting: Family Medicine

## 2015-09-23 ENCOUNTER — Other Ambulatory Visit: Payer: Self-pay | Admitting: *Deleted

## 2015-09-23 MED ORDER — METFORMIN HCL 1000 MG PO TABS: 1000.0000 mg | ORAL_TABLET | Freq: Two times a day (BID) | ORAL | 3 refills | Status: DC

## 2015-09-23 NOTE — Telephone Encounter (Signed)
Please let pt know--yes, she should increase to 1000mg  twice a day of the metformin. Can have her f/u with Tammy for DM2 education, keep checking AM BGLS, likely will need another medication.

## 2015-09-23 NOTE — Telephone Encounter (Signed)
Denied.

## 2015-09-24 ENCOUNTER — Other Ambulatory Visit: Payer: Medicare PPO

## 2015-10-04 ENCOUNTER — Other Ambulatory Visit: Payer: Medicare PPO

## 2015-10-04 DIAGNOSIS — R399 Unspecified symptoms and signs involving the genitourinary system: Secondary | ICD-10-CM

## 2015-10-04 LAB — MICROSCOPIC EXAMINATION

## 2015-10-04 LAB — URINALYSIS, COMPLETE
Bilirubin, UA: NEGATIVE
Glucose, UA: NEGATIVE
KETONES UA: NEGATIVE
NITRITE UA: NEGATIVE
PH UA: 5.5 (ref 5.0–7.5)
Protein, UA: NEGATIVE
Specific Gravity, UA: 1.015 (ref 1.005–1.030)
Urobilinogen, Ur: 0.2 mg/dL (ref 0.2–1.0)

## 2015-10-06 ENCOUNTER — Other Ambulatory Visit: Payer: Self-pay | Admitting: Family Medicine

## 2015-10-06 MED ORDER — NITROFURANTOIN MONOHYD MACRO 100 MG PO CAPS: 100.0000 mg | ORAL_CAPSULE | Freq: Two times a day (BID) | ORAL | 0 refills | Status: DC

## 2015-10-08 LAB — URINE CULTURE

## 2015-10-27 ENCOUNTER — Other Ambulatory Visit: Payer: Self-pay | Admitting: Family Medicine

## 2015-11-11 ENCOUNTER — Other Ambulatory Visit: Payer: Self-pay | Admitting: Family Medicine

## 2015-11-19 ENCOUNTER — Other Ambulatory Visit: Payer: Self-pay | Admitting: Family Medicine

## 2015-11-29 ENCOUNTER — Ambulatory Visit (INDEPENDENT_AMBULATORY_CARE_PROVIDER_SITE_OTHER): Payer: Medicare PPO | Admitting: Family Medicine

## 2015-11-29 ENCOUNTER — Encounter: Payer: Self-pay | Admitting: Family Medicine

## 2015-11-29 VITALS — BP 136/79 | HR 74 | Ht 66.0 in | Wt 192.1 lb

## 2015-11-29 DIAGNOSIS — Z23 Encounter for immunization: Secondary | ICD-10-CM | POA: Diagnosis not present

## 2015-11-29 DIAGNOSIS — E119 Type 2 diabetes mellitus without complications: Secondary | ICD-10-CM

## 2015-11-29 DIAGNOSIS — R159 Full incontinence of feces: Secondary | ICD-10-CM | POA: Diagnosis not present

## 2015-11-29 DIAGNOSIS — R152 Fecal urgency: Secondary | ICD-10-CM

## 2015-11-29 DIAGNOSIS — K580 Irritable bowel syndrome with diarrhea: Secondary | ICD-10-CM

## 2015-11-29 LAB — POCT GLUCOSE (DEVICE FOR HOME USE)

## 2015-11-29 MED ORDER — SITAGLIP PHOS-METFORMIN HCL ER 100-1000 MG PO TB24: 1.0000 | ORAL_TABLET | Freq: Every day | ORAL | 5 refills | Status: DC

## 2015-11-29 NOTE — Progress Notes (Signed)
Subjective:  Patient ID: Debra Miranda, female    DOB: 1945-10-18  Age: 70 y.o. MRN: [id number]  CC: Irritable Bowel Syndrome (pt here today c/o what she thinks is IBS, she has a hx of IBS about 22 years ago)   HPI Debra Miranda presents for 3 or 4 loose bowel movements daily for the last 3 months. This is not painful. However she can lose little bits when she coughs. She says she can't make it to the restroom sometimes so she's had thing to start wearing a diaper. The bowel movements are watery. She denies hematochezia and melena. No change in appetite or diet. It seems to have gotten worse when she started taking the metformin. Reducing the dose did not help.   History Debra Miranda has a past medical history of Allergy; Depression; Diabetes mellitus without complication ([place]); and Orthostatic hypotension.   She has a past surgical history that includes Tonsillectomy and Lumbar disc surgery.   Her family history includes Alzheimer's disease in her brother; Cancer in her mother; Diabetes in her brother; Pulmonary fibrosis in her brother; Stroke in her father.She reports that she quit smoking about 5 years ago. She quit after 45.00 years of use. She has never used smokeless tobacco. She reports that she drinks alcohol. She reports that she does not use drugs.    ROS Review of Systems  Constitutional: Negative for activity change, appetite change and fever.  HENT: Negative for congestion, rhinorrhea and sore throat.   Eyes: Negative for visual disturbance.  Respiratory: Negative for cough and shortness of breath.   Cardiovascular: Negative for chest pain and palpitations.  Gastrointestinal: Positive for diarrhea. Negative for abdominal pain, nausea and vomiting.  Genitourinary: Negative for dysuria.  Musculoskeletal: Negative for arthralgias and myalgias.    Objective:  BP 136/79   Pulse 74   Ht 5\' 6"  (1.676 m)   Wt 192 lb 2 oz (87.1 kg)   BMI 31.01 kg/m   BP Readings from Last 3  Encounters:  11/29/15 136/79  09/09/15 127/72  03/12/15 138/85    Wt Readings from Last 3 Encounters:  11/29/15 192 lb 2 oz (87.1 kg)  09/09/15 196 lb 3.2 oz (89 kg)  03/12/15 196 lb (88.9 kg)     Physical Exam  Constitutional: She is oriented to person, place, and time. She appears well-developed and well-nourished. No distress.  HENT:  Head: Normocephalic and atraumatic.  Right Ear: External ear normal.  Left Ear: External ear normal.  Nose: Nose normal.  Mouth/Throat: Oropharynx is clear and moist.  Eyes: Conjunctivae and EOM are normal. Pupils are equal, round, and reactive to light.  Neck: Normal range of motion. Neck supple. No thyromegaly present.  Cardiovascular: Normal rate, regular rhythm and normal heart sounds.   No murmur heard. Pulmonary/Chest: Effort normal and breath sounds normal. No respiratory distress. She has no wheezes. She has no rales.  Abdominal: Soft. Bowel sounds are normal. She exhibits no distension. There is no tenderness.  Lymphadenopathy:    She has no cervical adenopathy.  Neurological: She is alert and oriented to person, place, and time. She has normal reflexes.  Skin: Skin is warm and dry.  Psychiatric: She has a normal mood and affect. Her behavior is normal. Judgment and thought content normal.     Lab Results  Component Value Date   WBC 8.0 09/06/2015   HGB 15.2 05/09/2014   HCT 46.7 (H) 09/06/2015   PLT 142 (L) 09/06/2015   GLUCOSE 301 (H)  09/06/2015   CHOL 135 09/06/2015   TRIG 135 09/06/2015   HDL 52 09/06/2015   LDLCALC 56 09/06/2015   ALT 9 09/06/2015   AST 10 09/06/2015   NA 139 09/06/2015   K 4.6 09/06/2015   CL 101 09/06/2015   CREATININE 0.60 09/06/2015   BUN 8 09/06/2015   CO2 20 09/06/2015   TSH 1.650 05/09/2014   HGBA1C 7.5 03/12/2015   MICROALBUR negative 11/12/2014    No results found.  Assessment & Plan:   Debra Miranda was seen today for irritable bowel syndrome.  Diagnoses and all orders for this  visit:  Encounter for immunization -     Flu vaccine HIGH DOSE PF  Irritable bowel syndrome with diarrhea  Incontinence of feces with fecal urgency  Other orders -     SitaGLIPtin-MetFORMIN HCl (JANUMET XR) [id number] MG TB24; Take 1 tablet by mouth daily. For diabetes  Hopefully the lower dose of metformin and using the X are formed will reduce her loose bowels. If not will need to consider further GI workup.   I have discontinued Debra Miranda's metFORMIN, nitrofurantoin (macrocrystal-monohydrate), and metFORMIN. I am also having her start on SitaGLIPtin-MetFORMIN HCl. Additionally, I am having her maintain her Vitamin D, atorvastatin, and escitalopram.  Meds ordered this encounter  Medications  . SitaGLIPtin-MetFORMIN HCl (JANUMET XR) [id number] MG TB24    Sig: Take 1 tablet by mouth daily. For diabetes    Dispense:  30 tablet    Refill:  5    Please explained to the patient that this replaces her metformin. It also includes the new medication I promised to her.     Follow-up: No Follow-up on file.  Claretta Fraise, M.D.

## 2015-12-03 ENCOUNTER — Encounter: Payer: Self-pay | Admitting: Family Medicine

## 2015-12-03 DIAGNOSIS — R159 Full incontinence of feces: Secondary | ICD-10-CM

## 2015-12-03 DIAGNOSIS — R152 Fecal urgency: Secondary | ICD-10-CM | POA: Insufficient documentation

## 2015-12-03 DIAGNOSIS — K589 Irritable bowel syndrome without diarrhea: Secondary | ICD-10-CM | POA: Insufficient documentation

## 2016-02-16 ENCOUNTER — Other Ambulatory Visit: Payer: Self-pay | Admitting: Family Medicine

## 2016-02-24 ENCOUNTER — Other Ambulatory Visit: Payer: Self-pay | Admitting: Family Medicine

## 2016-05-19 ENCOUNTER — Other Ambulatory Visit: Payer: Self-pay | Admitting: Family Medicine

## 2016-05-25 ENCOUNTER — Other Ambulatory Visit: Payer: Self-pay | Admitting: Family Medicine

## 2016-07-04 ENCOUNTER — Other Ambulatory Visit: Payer: Self-pay | Admitting: Family Medicine

## 2016-07-07 ENCOUNTER — Telehealth: Payer: Self-pay | Admitting: Family Medicine

## 2016-07-20 DIAGNOSIS — E663 Overweight: Secondary | ICD-10-CM | POA: Diagnosis not present

## 2016-07-20 DIAGNOSIS — E119 Type 2 diabetes mellitus without complications: Secondary | ICD-10-CM | POA: Diagnosis not present

## 2016-07-20 DIAGNOSIS — Z6828 Body mass index (BMI) 28.0-28.9, adult: Secondary | ICD-10-CM | POA: Diagnosis not present

## 2016-07-20 DIAGNOSIS — E785 Hyperlipidemia, unspecified: Secondary | ICD-10-CM | POA: Diagnosis not present

## 2016-07-20 DIAGNOSIS — F330 Major depressive disorder, recurrent, mild: Secondary | ICD-10-CM | POA: Diagnosis not present

## 2016-07-20 DIAGNOSIS — F17200 Nicotine dependence, unspecified, uncomplicated: Secondary | ICD-10-CM | POA: Diagnosis not present

## 2016-07-20 DIAGNOSIS — H547 Unspecified visual loss: Secondary | ICD-10-CM | POA: Diagnosis not present

## 2016-07-27 ENCOUNTER — Ambulatory Visit (INDEPENDENT_AMBULATORY_CARE_PROVIDER_SITE_OTHER): Payer: Medicare PPO | Admitting: *Deleted

## 2016-07-27 ENCOUNTER — Ambulatory Visit (INDEPENDENT_AMBULATORY_CARE_PROVIDER_SITE_OTHER): Payer: Medicare PPO

## 2016-07-27 VITALS — BP 129/77 | HR 69 | Ht 66.0 in | Wt 183.0 lb

## 2016-07-27 DIAGNOSIS — E785 Hyperlipidemia, unspecified: Secondary | ICD-10-CM

## 2016-07-27 DIAGNOSIS — Z0000 Encounter for general adult medical examination without abnormal findings: Secondary | ICD-10-CM | POA: Diagnosis not present

## 2016-07-27 DIAGNOSIS — Z780 Asymptomatic menopausal state: Secondary | ICD-10-CM | POA: Diagnosis not present

## 2016-07-27 DIAGNOSIS — E119 Type 2 diabetes mellitus without complications: Secondary | ICD-10-CM

## 2016-07-27 NOTE — Patient Instructions (Addendum)
  Debra Miranda ,  Thank you for taking time to come for your Medicare Wellness Visit. I appreciate your ongoing commitment to your health goals. Please review the following plan we discussed and let me know if I can assist you in the future.   These are the goals we discussed: Goals    . Exercise 150 minutes per week (moderate activity)          Continue to stay active for at least 30 minutes per day       This is a list of the screening recommended for you and due dates:  Health Maintenance  Topic Date Due  .  Hepatitis C: One time screening is recommended by Center for Disease Control  (CDC) for  adults born from [birth year] through 1963.   Aug 20, 1945  . Complete foot exam   04/03/1957  . Tetanus Vaccine  04/03/1966  . Colon Cancer Screening  04/03/1997  . Pneumonia vaccines (2 of 2 - PPSV23) 03/17/2014  . Eye exam for diabetics  08/17/2014  . Mammogram  08/02/2015  . Urine Protein Check  11/12/2015  . Hemoglobin A1C  03/11/2016  . DEXA scan (bone density measurement)  05/08/2016  . Flu Shot  09/16/2016   Your bone density test was done today.  I will get the results of your last mammogram/ultrasound and order the appropriate follow up. You should receive a call by next week. If not, reach out to our referral department at [phone].  Return your stool specimen card  Check on the price of the tetanus shot at your follow up appointment.   Have your eye doctor send a copy of your eye exam to our office.

## 2016-07-27 NOTE — Progress Notes (Signed)
Subjective:   Debra Miranda is a 71 y.o. female who presents for an Initial Medicare Annual Wellness Visit. Debra Miranda is retired from [employer]. She lives in a house and has a roommate. She has one daughter and two grandchildren. She enjoys hiking and is active around her home. She push mows her front yard and stays busy working in her house and doing yard work.   Review of Systems    She reports that her health is about the same as last year.   Cardiac Risk Factors include: advanced age (>64men, >26 women);diabetes mellitus;dyslipidemia  Other systems negative.     Objective:    Today's Vitals   07/27/16 1355  BP: 129/77  Pulse: 69  Weight: 183 lb (83 kg)  Height: 5\' 6"  (1.676 m)   Body mass index is 29.54 kg/m.   Current Medications (verified) Outpatient Encounter Prescriptions as of 07/27/2016  Medication Sig  . atorvastatin (LIPITOR) 20 MG tablet TAKE 1 TABLET (20 MG TOTAL) BY MOUTH DAILY.  Marland Kitchen Cholecalciferol (VITAMIN D) 2000 UNITS CAPS Take 2,000 Units by mouth daily.  Marland Kitchen escitalopram (LEXAPRO) 10 MG tablet TAKE 1 TABLET (10 MG TOTAL) BY MOUTH DAILY.  . metFORMIN (GLUCOPHAGE) 500 MG tablet Take 500 mg by mouth 2 (two) times daily with a meal.  . [DISCONTINUED] escitalopram (LEXAPRO) 10 MG tablet TAKE 1 TABLET (10 MG TOTAL) BY MOUTH DAILY.  . [DISCONTINUED] SitaGLIPtin-MetFORMIN HCl (JANUMET XR) [id number] MG TB24 Take 1 tablet by mouth daily. For diabetes   No facility-administered encounter medications on file as of 07/27/2016.     Allergies (verified) Latex   History: Past Medical History:  Diagnosis Date  . Allergy   . Depression    many years ago, no meds  . Diabetes mellitus without complication ([place])   . Orthostatic hypotension    Past Surgical History:  Procedure Laterality Date  . LUMBAR DISC SURGERY    . TONSILLECTOMY     as a child   Family History  Problem Relation Age of Onset  . Cancer Mother        cervical, metastasis  . Stroke  Father   . Pulmonary fibrosis Brother   . Alzheimer's disease Brother   . Diabetes Brother   . Colon cancer Neg Hx    Social History   Occupational History  . Not on file.   Social History Main Topics  . Smoking status: Former Smoker    Years: 45.00    Quit date: 07/29/2010  . Smokeless tobacco: Never Used     Comment: vapor  . Alcohol use No  . Drug use: No  . Sexual activity: No    Tobacco Counseling No tobacco use  Activities of Daily Living In your present state of health, do you have any difficulty performing the following activities: 07/27/2016 09/09/2015  Hearing? N N  Vision? Y N  Difficulty concentrating or making decisions? N N  Walking or climbing stairs? N N  Dressing or bathing? N N  Doing errands, shopping? N N  Preparing Food and eating ? N -  Using the Toilet? N -  In the past six months, have you accidently leaked urine? Y -  Do you have problems with loss of bowel control? N -  Managing your Medications? N -  Managing your Finances? N -  Housekeeping or managing your Housekeeping? N -  Some recent data might be hidden    Immunizations and Health Maintenance Immunization History  Administered Date(s)  Administered  . Influenza, High Dose Seasonal PF 11/29/2015  . Influenza,inj,Quad PF,36+ Mos 12/07/2013, 11/12/2014  . Pneumococcal Conjugate-13 03/17/2013   Health Maintenance Due  Topic Date Due  . Hepatitis C Screening  Nov 16, 1945  . FOOT EXAM  04/03/1957  . TETANUS/TDAP  04/03/1966  . COLONOSCOPY  04/03/1997  . PNA vac Low Risk Adult (2 of 2 - PPSV23) 03/17/2014  . OPHTHALMOLOGY EXAM  08/17/2014  . MAMMOGRAM  08/02/2015  . URINE MICROALBUMIN  11/12/2015  . HEMOGLOBIN A1C  03/11/2016  . DEXA SCAN  05/08/2016    Patient Care Team: Debra Fraise, MD as PCP - General [facility])  Debra Miranda (?)- Opthalmology  No hospitalizations, ER visits, or surgeries within the past year.      Assessment:   This is a routine wellness  examination for [name].   Hearing/Vision screen No deficits noted during exam. Patient does complain of blurring in right eye. She has an eye exam scheduled for 08/06/16. She will as them to send a copy of the report to our office.   Dietary issues and exercise activities discussed: Current Exercise Habits: Home exercise routine, Type of exercise: walking;Other - see comments (Works around house and in her yard daily), Time (Minutes): 30, Frequency (Times/Week): 7, Weekly Exercise (Minutes/Week): 210, Intensity: Moderate, Exercise limited by: None identified   Diet: Reports eating two meals a day. Generally doesn't eat breakfast because she isn't hungry when she wakes up.   Goals    . Exercise 150 minutes per week (moderate activity)          Continue to stay active for at least 30 minutes per day      Depression Screen PHQ 2/9 Scores 11/29/2015 09/09/2015 03/12/2015 11/12/2014 05/09/2014 09/12/2013  PHQ - 2 Score 0 1 0 0 0 0    Fall Risk Fall Risk  11/29/2015 09/09/2015 03/12/2015 11/12/2014 05/09/2014  Falls in the past year? No No No No No  Number falls in past yr: - - - - -  Injury with Fall? - - - - -    Cognitive Function:    MMSE - Mini Mental State Exam 07/27/2016  Orientation to time 5  Orientation to Place 5  Registration 3  Attention/ Calculation 5  Recall 3  Language- name 2 objects 2  Language- repeat 1  Language- follow 3 step command 3  Language- read & follow direction 1  Write a sentence 1  Copy design 1  Total score 30   Normal exam       Screening Tests Health Maintenance  Topic Date Due  . Hepatitis C Screening  1945-11-18  . FOOT EXAM  04/03/1957  . TETANUS/TDAP  04/03/1966  . COLONOSCOPY  04/03/1997  . PNA vac Low Risk Adult (2 of 2 - PPSV23) 03/17/2014  . OPHTHALMOLOGY EXAM  08/17/2014  . MAMMOGRAM  08/02/2015  . URINE MICROALBUMIN  11/12/2015  . HEMOGLOBIN A1C  03/11/2016  . DEXA SCAN  05/08/2016  . INFLUENZA VACCINE  09/16/2016  Tdap too  expensive today.  Eye exam scheduled. Mammogram ordered. Dexa done today FOBT given today     Plan:   Bilateral diagnostic mammogram ordered as a follow up to diagnostic mammo done in 2015. Dexa scan done today. Urine microalbumine and other rouutine labs ordered. She will come 2 days before her appt with Debra Livia Snellen to have this done. Appt with Debra Livia Snellen scheduled for 08/12/16. Check on the cost of Tdap at next visit. Have eye doctor send  a copy of their report to our office. Return FOBT to our office  I have personally reviewed and noted the following in the patient's chart:   . Medical and social history . Use of alcohol, tobacco or illicit drugs  . Current medications and supplements . Functional ability and status . Nutritional status . Physical activity . Advanced directives . List of other physicians . Hospitalizations, surgeries, and ER visits in previous 12 months . Vitals . Screenings to include cognitive, depression, and falls . Referrals and appointments  In addition, I have reviewed and discussed with patient certain preventive protocols, quality metrics, and best practice recommendations. A written personalized care plan for preventive services as well as general preventive health recommendations were provided to patient.     Chong Sicilian, RN   07/27/2016     I have reviewed and agree with the above AWV documentation.   Assunta Found, MD [place]

## 2016-07-28 ENCOUNTER — Other Ambulatory Visit: Payer: Self-pay | Admitting: *Deleted

## 2016-07-28 DIAGNOSIS — R928 Other abnormal and inconclusive findings on diagnostic imaging of breast: Secondary | ICD-10-CM

## 2016-07-30 ENCOUNTER — Other Ambulatory Visit: Payer: Medicare PPO

## 2016-07-30 DIAGNOSIS — Z1211 Encounter for screening for malignant neoplasm of colon: Secondary | ICD-10-CM | POA: Diagnosis not present

## 2016-07-31 LAB — FECAL OCCULT BLOOD, IMMUNOCHEMICAL: FECAL OCCULT BLD: NEGATIVE

## 2016-08-04 ENCOUNTER — Other Ambulatory Visit: Payer: Medicare PPO

## 2016-08-06 DIAGNOSIS — H2513 Age-related nuclear cataract, bilateral: Secondary | ICD-10-CM | POA: Diagnosis not present

## 2016-08-06 LAB — HM DIABETES EYE EXAM

## 2016-08-10 ENCOUNTER — Ambulatory Visit: Payer: Medicare PPO

## 2016-08-10 ENCOUNTER — Ambulatory Visit: Payer: Medicare PPO | Admitting: Family Medicine

## 2016-08-10 DIAGNOSIS — E785 Hyperlipidemia, unspecified: Secondary | ICD-10-CM | POA: Diagnosis not present

## 2016-08-10 DIAGNOSIS — E119 Type 2 diabetes mellitus without complications: Secondary | ICD-10-CM

## 2016-08-11 LAB — CMP14+EGFR
A/G RATIO: 1.7 (ref 1.2–2.2)
ALK PHOS: 111 IU/L (ref 39–117)
ALT: 10 IU/L (ref 0–32)
AST: 12 IU/L (ref 0–40)
Albumin: 4.3 g/dL (ref 3.6–4.8)
BILIRUBIN TOTAL: 0.3 mg/dL (ref 0.0–1.2)
BUN / CREAT RATIO: 15 (ref 12–28)
BUN: 10 mg/dL (ref 8–27)
CO2: 19 mmol/L — ABNORMAL LOW (ref 20–29)
Calcium: 9.5 mg/dL (ref 8.7–10.3)
Chloride: 106 mmol/L (ref 96–106)
Creatinine, Ser: 0.66 mg/dL (ref 0.57–1.00)
GFR calc non Af Amer: 90 mL/min/{1.73_m2} (ref >59)
GFR, EST AFRICAN AMERICAN: 104 mL/min/{1.73_m2} (ref >59)
GLOBULIN, TOTAL: 2.6 g/dL (ref 1.5–4.5)
GLUCOSE: 165 mg/dL — AB (ref 65–99)
POTASSIUM: 4.6 mmol/L (ref 3.5–5.2)
SODIUM: 142 mmol/L (ref 134–144)
TOTAL PROTEIN: 6.9 g/dL (ref 6.0–8.5)

## 2016-08-11 LAB — LIPID PANEL
CHOLESTEROL TOTAL: 206 mg/dL — AB (ref 100–199)
Chol/HDL Ratio: 4.4 ratio (ref 0.0–4.4)
HDL: 47 mg/dL (ref >39)
LDL Calculated: 135 mg/dL — ABNORMAL HIGH (ref 0–99)
Triglycerides: 119 mg/dL (ref 0–149)
VLDL CHOLESTEROL CAL: 24 mg/dL (ref 5–40)

## 2016-08-12 ENCOUNTER — Encounter: Payer: Self-pay | Admitting: Family Medicine

## 2016-08-12 ENCOUNTER — Ambulatory Visit (INDEPENDENT_AMBULATORY_CARE_PROVIDER_SITE_OTHER): Payer: Medicare PPO | Admitting: Family Medicine

## 2016-08-12 VITALS — BP 116/63 | HR 78 | Ht 66.0 in | Wt 185.0 lb

## 2016-08-12 DIAGNOSIS — E119 Type 2 diabetes mellitus without complications: Secondary | ICD-10-CM

## 2016-08-12 DIAGNOSIS — E785 Hyperlipidemia, unspecified: Secondary | ICD-10-CM

## 2016-08-12 DIAGNOSIS — F3341 Major depressive disorder, recurrent, in partial remission: Secondary | ICD-10-CM

## 2016-08-12 LAB — BAYER DCA HB A1C WAIVED: HB A1C (BAYER DCA - WAIVED): 7.1 % — ABNORMAL HIGH (ref <7.0)

## 2016-08-12 MED ORDER — ATORVASTATIN CALCIUM 20 MG PO TABS: 20.0000 mg | ORAL_TABLET | Freq: Every day | ORAL | 3 refills | Status: DC

## 2016-08-12 MED ORDER — METFORMIN HCL 500 MG PO TABS: 500.0000 mg | ORAL_TABLET | Freq: Every day | ORAL | 3 refills | Status: DC

## 2016-08-12 NOTE — Addendum Note (Signed)
Addended by: Marylin Crosby on: 08/12/2016 04:57 PM   Modules accepted: Orders

## 2016-08-12 NOTE — Progress Notes (Signed)
Subjective:  Patient ID: Debra Miranda,  female    DOB: Nov 18, 1945  Age: 71 y.o.    CC: Diabetes (pt here today for routine follow up of her chronic medical conditions)   HPI CARLINDA Miranda presents for  follow-up of elevated cholesterol. Ran out of atorvastatin several months ago. Decided not to take it. Denies side effects of statin including myalgia and arthralgia and nausea. Also in today for liver function testing. Currently no chest pain, shortness of breath or other cardiovascular related symptoms noted. She is willing to go back on the medicine since it causes no problems and her LDL is 135 with a goal of 70 due to her diabetes.  Follow-up of diabetes. Patient does check blood sugar at home. Readings run between 100 and 150 Patient denies symptoms such as polyuria, polydipsia, excessive hunger, nausea No significant hypoglycemic spells noted. Medications reviewed. Pt reports taking them regularly. Pt. denies complication/adverse reaction today. Patient reports that she is been under the care of ophthalmology and has an upcoming right cataract surgery planned. The left eye is 20/40 and not ready for surgery.  Depression screen [facility] 2/9 08/12/2016 11/29/2015 09/09/2015  Decreased Interest 0 0 1  Down, Depressed, Hopeless 0 0 0  PHQ - 2 Score 0 0 1    Patient does well with her depression as noted above as long as she takes the citalopram.   History [name] has a past medical history of Allergy; Depression; Diabetes mellitus without complication ([place]); and Orthostatic hypotension.   She has a past surgical history that includes Tonsillectomy and Lumbar disc surgery.   Her family history includes Alzheimer's disease in her brother; Cancer in her mother; Diabetes in her brother; Pulmonary fibrosis in her brother; Stroke in her father.She reports that she quit smoking about 6 years ago. She quit after 45.00 years of use. She has never used smokeless tobacco. She reports that she does  not drink alcohol or use drugs.  Current Outpatient Prescriptions on File Prior to Visit  Medication Sig Dispense Refill  . Cholecalciferol (VITAMIN D) 2000 UNITS CAPS Take 2,000 Units by mouth daily.    Marland Kitchen escitalopram (LEXAPRO) 10 MG tablet TAKE 1 TABLET (10 MG TOTAL) BY MOUTH DAILY. 90 tablet 0   No current facility-administered medications on file prior to visit.     ROS Review of Systems  Constitutional: Negative for activity change, appetite change and fever.  HENT: Negative for congestion, rhinorrhea and sore throat.   Eyes: Negative for visual disturbance.  Respiratory: Negative for cough and shortness of breath.   Cardiovascular: Negative for chest pain and palpitations.  Gastrointestinal: Negative for abdominal pain, diarrhea and nausea.  Genitourinary: Negative for dysuria.  Musculoskeletal: Negative for arthralgias and myalgias.    Objective:  BP 116/63   Pulse 78   Ht 5\' 6"  (1.676 m)   Wt 185 lb (83.9 kg)   BMI 29.86 kg/m   BP Readings from Last 3 Encounters:  08/12/16 116/63  07/27/16 129/77  11/29/15 136/79    Wt Readings from Last 3 Encounters:  08/12/16 185 lb (83.9 kg)  07/27/16 183 lb (83 kg)  11/29/15 192 lb 2 oz (87.1 kg)     Physical Exam  Constitutional: She is oriented to person, place, and time. She appears well-developed and well-nourished. No distress.  HENT:  Head: Normocephalic and atraumatic.  Right Ear: External ear normal.  Left Ear: External ear normal.  Nose: Nose normal.  Mouth/Throat: Oropharynx is clear and moist.  Eyes: Conjunctivae and EOM are normal. Pupils are equal, round, and reactive to light.  Neck: Normal range of motion. Neck supple. No thyromegaly present.  Cardiovascular: Normal rate, regular rhythm and normal heart sounds.   No murmur heard. Pulmonary/Chest: Effort normal and breath sounds normal. No respiratory distress. She has no wheezes. She has no rales.  Abdominal: Soft. Bowel sounds are normal. She  exhibits no distension. There is no tenderness.  Lymphadenopathy:    She has no cervical adenopathy.  Neurological: She is alert and oriented to person, place, and time. She has normal reflexes.  Skin: Skin is warm and dry.  Psychiatric: She has a normal mood and affect. Her behavior is normal. Judgment and thought content normal.    Diabetic Foot Exam - Simple   Simple Foot Form Diabetic Foot exam was performed with the following findings:  Yes 08/12/2016  1:09 PM  Visual Inspection No deformities, no ulcerations, no other skin breakdown bilaterally:  Yes Sensation Testing Intact to touch and monofilament testing bilaterally:  Yes Pulse Check Posterior Tibialis and Dorsalis pulse intact bilaterally:  Yes Comments       Assessment & Plan:   Ica was seen today for diabetes.  Diagnoses and all orders for this visit:  Controlled type 2 diabetes mellitus without complication, without long-term current use of insulin (HCC) -     POCT glycosylated hemoglobin (Hb A1C) -     metFORMIN (GLUCOPHAGE) 500 MG tablet; Take 1 tablet (500 mg total) by mouth daily.  Hyperlipidemia, unspecified hyperlipidemia type -     atorvastatin (LIPITOR) 20 MG tablet; Take 1 tablet (20 mg total) by mouth daily.  Recurrent major depressive disorder, in partial remission ([place])   I have changed Debra Miranda's metFORMIN. I am also having her maintain her Vitamin D, escitalopram, and atorvastatin.  Meds ordered this encounter  Medications  . atorvastatin (LIPITOR) 20 MG tablet    Sig: Take 1 tablet (20 mg total) by mouth daily.    Dispense:  90 tablet    Refill:  3  . metFORMIN (GLUCOPHAGE) 500 MG tablet    Sig: Take 1 tablet (500 mg total) by mouth daily.    Dispense:  90 tablet    Refill:  3     Follow-up: Return in about 3 months (around 11/12/2016).  Claretta Fraise, M.D.

## 2016-08-12 NOTE — Addendum Note (Signed)
Addended by: Marylin Crosby on: 08/12/2016 02:32 PM   Modules accepted: Orders

## 2016-08-13 LAB — MICROALBUMIN / CREATININE URINE RATIO
Creatinine, Urine: 46.7 mg/dL
Microalb/Creat Ratio: 10.1 mg/g creat (ref 0.0–30.0)
Microalbumin, Urine: 4.7 ug/mL

## 2016-08-17 ENCOUNTER — Ambulatory Visit (INDEPENDENT_AMBULATORY_CARE_PROVIDER_SITE_OTHER): Payer: Medicare PPO | Admitting: Pharmacist

## 2016-08-17 ENCOUNTER — Encounter: Payer: Self-pay | Admitting: Pharmacist

## 2016-08-17 VITALS — Ht 66.0 in | Wt 185.0 lb

## 2016-08-17 DIAGNOSIS — M85852 Other specified disorders of bone density and structure, left thigh: Secondary | ICD-10-CM | POA: Diagnosis not present

## 2016-08-17 MED ORDER — CALCIUM CARBONATE-VITAMIN D 500-200 MG-UNIT PO TABS: 1.0000 | ORAL_TABLET | Freq: Two times a day (BID) | ORAL | Status: DC

## 2016-08-17 NOTE — Patient Instructions (Signed)
Restart walking every day  - make sure to take walking stick to decrease falls.   Start Calcium 500mg  + D - take 1 tablet twice a day with food.     Fall Prevention in the Home Falls can cause injuries and can affect people from all age groups. There are many simple things that you can do to make your home safe and to help prevent falls. What can I do on the outside of my home?  Regularly repair the edges of walkways and driveways and fix any cracks.  Remove high doorway thresholds.  Trim any shrubbery on the main path into your home.  Use bright outdoor lighting.  Clear walkways of debris and clutter, including tools and rocks.  Regularly check that handrails are securely fastened and in good repair. Both sides of any steps should have handrails.  Install guardrails along the edges of any raised decks or porches.  Have leaves, snow, and ice cleared regularly.  Use sand or salt on walkways during winter months.  In the garage, clean up any spills right away, including grease or oil spills. What can I do in the bathroom?  Use night lights.  Install grab bars by the toilet and in the tub and shower. Do not use towel bars as grab bars.  Use non-skid mats or decals on the floor of the tub or shower.  If you need to sit down while you are in the shower, use a plastic, non-slip stool.  Keep the floor dry. Immediately clean up any water that spills on the floor.  Remove soap buildup in the tub or shower on a regular basis.  Attach bath mats securely with double-sided non-slip rug tape.  Remove throw rugs and other tripping hazards from the floor. What can I do in the bedroom?  Use night lights.  Make sure that a bedside light is easy to reach.  Do not use oversized bedding that drapes onto the floor.  Have a firm chair that has side arms to use for getting dressed.  Remove throw rugs and other tripping hazards from the floor. What can I do in the kitchen?  Clean up  any spills right away.  Avoid walking on wet floors.  Place frequently used items in easy-to-reach places.  If you need to reach for something above you, use a sturdy step stool that has a grab bar.  Keep electrical cables out of the way.  Do not use floor polish or wax that makes floors slippery. If you have to use wax, make sure that it is non-skid floor wax.  Remove throw rugs and other tripping hazards from the floor. What can I do in the stairways?  Do not leave any items on the stairs.  Make sure that there are handrails on both sides of the stairs. Fix handrails that are broken or loose. Make sure that handrails are as long as the stairways.  Check any carpeting to make sure that it is firmly attached to the stairs. Fix any carpet that is loose or worn.  Avoid having throw rugs at the top or bottom of stairways, or secure the rugs with carpet tape to prevent them from moving.  Make sure that you have a light switch at the top of the stairs and the bottom of the stairs. If you do not have them, have them installed. What are some other fall prevention tips?  Wear closed-toe shoes that fit well and support your feet. Wear shoes that  have rubber soles or low heels.  When you use a stepladder, make sure that it is completely opened and that the sides are firmly locked. Have someone hold the ladder while you are using it. Do not climb a closed stepladder.  Add color or contrast paint or tape to grab bars and handrails in your home. Place contrasting color strips on the first and last steps.  Use mobility aids as needed, such as canes, walkers, scooters, and crutches.  Turn on lights if it is dark. Replace any light bulbs that burn out.  Set up furniture so that there are clear paths. Keep the furniture in the same spot.  Fix any uneven floor surfaces.  Choose a carpet design that does not hide the edge of steps of a stairway.  Be aware of any and all pets.  Review your  medicines with your healthcare provider. Some medicines can cause dizziness or changes in blood pressure, which increase your risk of falling. Talk with your health care provider about other ways that you can decrease your risk of falls. This may include working with a physical therapist or trainer to improve your strength, balance, and endurance. This information is not intended to replace advice given to you by your health care provider. Make sure you discuss any questions you have with your health care provider. Document Released: 01/23/2002 Document Revised: 07/02/2015 Document Reviewed: 03/09/2014 Elsevier Interactive Patient Education  2017 [employer].

## 2016-08-17 NOTE — Progress Notes (Signed)
Patient ID: Debra Miranda, female   DOB: [birth year], 71 y.o.   MRN: [id number]      HPI:  Back Pain?  Yes   - has had 2 back surgeries;     Kyphosis?  No Prior osteoporotic adult fracture?  No Med(s) for Osteoporosis/Osteopenia:  none Med(s) previously tried for Osteoporosis/Osteopenia:  none                                                             PMH: Age at menopause:  About 89's Hysterectomy?  No Oophorectomy?  No HRT? No Steroid Use?  No Thyroid med?  No History of cancer?  No History of digestive disorders (ie Crohn's)?  No Current or previous eating disorders?  No Last Vitamin D Result:  16.9 (05/09/2014) Last GFR Result:  90 (08/10/2016)   FH/SH: Family history of osteoporosis?  No Parent with history of hip fracture?  No Family history of breast cancer?  No Exercise?  No - decreased due to heat; active lifestyle - push mows Smoking?  Yes  Alcohol?  No    Calcium Assessment Calcium Intake  # of servings/day  Calcium mg  Milk (8 oz) 0  (lactose intolerant)  x  300  = 0  Yogurt (4 oz) 0 x  200 = 0  Cheese (1 oz) 1 x  200 = 200mg   Other Calcium sources   250mg   Ca supplement no = 0   Estimated calcium intake per day 450mg    Current Height:  5\' 6"  Max Lifetime Height:  5' 7.5"        DEXA Results Date of Test Left Forearm - 33% Radius T-Score for Neck of Left Hip  07/27/2016 -0.1 -1.2  05/12/2014 0.2 -0.8           FRAX 10 year estimate: Total FX risk:  9.1%  (consider medication if >/= 20%) Hip FX risk:  1.7%  (consider medication if >/= 3%)  Assessment: Osteopenia with low fracture risk per FRAX   Recommendations: 1.  Discussed BMD  / DEXA results and discussed fracture risk. 2.  recommend calcium 1200mg  daily through supplementation or diet.  3.  recommend weight bearing exercise - 30 minutes at least 4 days per week.   4.  Counseled and educated about fall risk and prevention.  Recheck DEXA:  2 years  Time spent counseling patient:  20  minutes

## 2016-08-18 LAB — SPECIMEN STATUS REPORT

## 2016-08-18 LAB — VITAMIN D 25 HYDROXY (VIT D DEFICIENCY, FRACTURES): Vit D, 25-Hydroxy: 27.8 ng/mL — ABNORMAL LOW (ref 30.0–100.0)

## 2016-08-25 ENCOUNTER — Telehealth: Payer: Self-pay | Admitting: Family Medicine

## 2016-08-25 NOTE — Telephone Encounter (Signed)
Pt advised she doesn't need to come in for another A1C as hers was excellent per [facility] documentation on her last labs 08/12/16. Pt voiced understanding.

## 2016-09-09 DIAGNOSIS — H2511 Age-related nuclear cataract, right eye: Secondary | ICD-10-CM | POA: Diagnosis not present

## 2016-09-09 DIAGNOSIS — H25811 Combined forms of age-related cataract, right eye: Secondary | ICD-10-CM | POA: Diagnosis not present

## 2016-09-24 ENCOUNTER — Other Ambulatory Visit: Payer: Self-pay | Admitting: Family Medicine

## 2016-11-13 ENCOUNTER — Ambulatory Visit: Payer: Medicare PPO | Admitting: Family Medicine

## 2016-11-13 ENCOUNTER — Encounter: Payer: Self-pay | Admitting: Family Medicine

## 2016-11-16 ENCOUNTER — Other Ambulatory Visit: Payer: Medicare PPO

## 2016-11-16 DIAGNOSIS — M859 Disorder of bone density and structure, unspecified: Secondary | ICD-10-CM | POA: Diagnosis not present

## 2016-11-16 DIAGNOSIS — E119 Type 2 diabetes mellitus without complications: Secondary | ICD-10-CM | POA: Diagnosis not present

## 2016-11-16 DIAGNOSIS — E7800 Pure hypercholesterolemia, unspecified: Secondary | ICD-10-CM | POA: Diagnosis not present

## 2016-11-16 LAB — BAYER DCA HB A1C WAIVED: HB A1C: 8.8 % — AB (ref <7.0)

## 2016-11-17 LAB — LIPID PANEL
CHOL/HDL RATIO: 2.9 ratio (ref 0.0–4.4)
Cholesterol, Total: 123 mg/dL (ref 100–199)
HDL: 42 mg/dL (ref >39)
LDL CALC: 57 mg/dL (ref 0–99)
TRIGLYCERIDES: 120 mg/dL (ref 0–149)
VLDL CHOLESTEROL CAL: 24 mg/dL (ref 5–40)

## 2016-11-17 LAB — VITAMIN D 25 HYDROXY (VIT D DEFICIENCY, FRACTURES): Vit D, 25-Hydroxy: 35.8 ng/mL (ref 30.0–100.0)

## 2016-11-17 LAB — CMP14+EGFR
A/G RATIO: 1.8 (ref 1.2–2.2)
ALK PHOS: 119 IU/L — AB (ref 39–117)
ALT: 12 IU/L (ref 0–32)
AST: 12 IU/L (ref 0–40)
Albumin: 4.2 g/dL (ref 3.6–4.8)
BILIRUBIN TOTAL: 0.5 mg/dL (ref 0.0–1.2)
BUN/Creatinine Ratio: 14 (ref 12–28)
BUN: 10 mg/dL (ref 8–27)
CHLORIDE: 102 mmol/L (ref 96–106)
CO2: 22 mmol/L (ref 20–29)
Calcium: 9.1 mg/dL (ref 8.7–10.3)
Creatinine, Ser: 0.69 mg/dL (ref 0.57–1.00)
GFR calc non Af Amer: 89 mL/min/{1.73_m2} (ref >59)
GFR, EST AFRICAN AMERICAN: 103 mL/min/{1.73_m2} (ref >59)
GLUCOSE: 220 mg/dL — AB (ref 65–99)
Globulin, Total: 2.3 g/dL (ref 1.5–4.5)
POTASSIUM: 4.3 mmol/L (ref 3.5–5.2)
Sodium: 138 mmol/L (ref 134–144)
TOTAL PROTEIN: 6.5 g/dL (ref 6.0–8.5)

## 2016-11-18 ENCOUNTER — Ambulatory Visit (INDEPENDENT_AMBULATORY_CARE_PROVIDER_SITE_OTHER): Payer: Medicare PPO | Admitting: Family Medicine

## 2016-11-18 ENCOUNTER — Encounter: Payer: Self-pay | Admitting: Family Medicine

## 2016-11-18 VITALS — BP 116/67 | HR 77 | Temp 97.6°F | Ht 66.0 in | Wt 188.0 lb

## 2016-11-18 DIAGNOSIS — Z23 Encounter for immunization: Secondary | ICD-10-CM

## 2016-11-18 DIAGNOSIS — E119 Type 2 diabetes mellitus without complications: Secondary | ICD-10-CM | POA: Diagnosis not present

## 2016-11-18 DIAGNOSIS — E7849 Other hyperlipidemia: Secondary | ICD-10-CM | POA: Diagnosis not present

## 2016-11-18 DIAGNOSIS — E785 Hyperlipidemia, unspecified: Secondary | ICD-10-CM

## 2016-11-18 MED ORDER — ESCITALOPRAM OXALATE 10 MG PO TABS: 10.0000 mg | ORAL_TABLET | Freq: Every day | ORAL | 1 refills | Status: DC

## 2016-11-18 MED ORDER — METFORMIN HCL 500 MG PO TABS: 500.0000 mg | ORAL_TABLET | Freq: Two times a day (BID) | ORAL | 3 refills | Status: DC

## 2016-11-18 NOTE — Progress Notes (Signed)
Subjective:  Patient ID: Debra Miranda,  female    DOB: Mar 11, 1945  Age: 71 y.o.    CC: Diabetes (pt here today for routine follow up of her chronic medical conditions, no other concerns voiced.)   HPI Debra Miranda presents for  follow-up of hypertension. Patient has no history of headache chest pain or shortness of breath or recent cough. Patient also denies symptoms of TIA such as numbness weakness lateralizing. Patient checks  blood pressure at home. Recent readings have been good Patient denies side effects from medication. States taking it regularly.  Patient also  in for follow-up of elevated cholesterol. Doing well without complaints on current medication. Denies side effects of statin including myalgia and arthralgia and nausea. Also in today for liver function testing. Currently no chest pain, shortness of breath or other cardiovascular related symptoms noted.  Follow-up of diabetes. Patient does not check blood sugar at home.  Patient denies symptoms such as polyuria, polydipsia, excessive hunger, nausea No significant hypoglycemic spells noted. Medications reviewed. Pt reports taking them regularly. Pt. denies complication/adverse reaction today.    History Debra Miranda has a past medical history of Allergy; Depression; Diabetes mellitus without complication (Debra Miranda); and Orthostatic hypotension.   She has a past surgical history that includes Tonsillectomy and Lumbar disc surgery.   Her family history includes Alzheimer's disease in her brother; Cancer in her mother; Diabetes in her brother; Pulmonary fibrosis in her brother; Stroke in her father.She reports that she has been smoking Cigarettes.  She has smoked for the past 45.00 years. She has never used smokeless tobacco. She reports that she does not drink alcohol or use drugs.  Current Outpatient Prescriptions on File Prior to Visit  Medication Sig Dispense Refill  . atorvastatin (LIPITOR) 20 MG tablet Take 1 tablet (20 mg  total) by mouth daily. 90 tablet 3  . Cholecalciferol (VITAMIN D) 2000 UNITS CAPS Take 2,000 Units by mouth daily.    Marland Kitchen CINNAMON PO Take 1 tablet by mouth daily.    . Omega-3 Fatty Acids (FISH OIL) 1000 MG CAPS Take 1 capsule by mouth daily.     No current facility-administered medications on file prior to visit.     ROS Review of Systems  Constitutional: Negative for activity change, appetite change and fever.  HENT: Negative for congestion, rhinorrhea and sore throat.   Eyes: Negative for visual disturbance.  Respiratory: Negative for cough and shortness of breath.   Cardiovascular: Negative for chest pain and palpitations.  Gastrointestinal: Negative for abdominal pain, diarrhea and nausea.  Genitourinary: Negative for dysuria.  Musculoskeletal: Negative for arthralgias and myalgias.    Objective:  BP 116/67   Pulse 77   Temp 97.6 F (36.4 C) (Oral)   Ht 5\' 6"  (1.676 m)   Wt 188 lb (85.3 kg)   BMI 30.34 kg/m   BP Readings from Last 3 Encounters:  11/18/16 116/67  08/12/16 116/63  07/27/16 129/77    Wt Readings from Last 3 Encounters:  11/18/16 188 lb (85.3 kg)  08/17/16 185 lb (83.9 kg)  08/12/16 185 lb (83.9 kg)     Physical Exam  Constitutional: She is oriented to person, place, and time. She appears well-developed and well-nourished. No distress.  HENT:  Head: Normocephalic and atraumatic.  Right Ear: External ear normal.  Left Ear: External ear normal.  Nose: Nose normal.  Mouth/Throat: Oropharynx is clear and moist.  Eyes: Pupils are equal, round, and reactive to light. Conjunctivae and EOM are normal.  Neck: Normal range of motion. Neck supple. No thyromegaly present.  Cardiovascular: Normal rate, regular rhythm and normal heart sounds.   No murmur heard. Pulmonary/Chest: Effort normal and breath sounds normal. No respiratory distress. She has no wheezes. She has no rales.  Abdominal: Soft. Bowel sounds are normal. She exhibits no distension. There is  no tenderness.  Lymphadenopathy:    She has no cervical adenopathy.  Neurological: She is alert and oriented to person, place, and time. She has normal reflexes.  Skin: Skin is warm and dry.  Psychiatric: She has a normal mood and affect. Her behavior is normal. Judgment and thought content normal.    Assessment & Plan:   Debra Miranda was seen today for diabetes.  Diagnoses and all orders for this visit:  Controlled type 2 diabetes mellitus without complication, without long-term current use of insulin (HCC) -     metFORMIN (GLUCOPHAGE) 500 MG tablet; Take 1 tablet (500 mg total) by mouth 2 (two) times daily with a meal.  Other hyperlipidemia  Hyperlipidemia, unspecified hyperlipidemia type  Encounter for immunization -     Flu vaccine HIGH DOSE PF  Other orders -     escitalopram (LEXAPRO) 10 MG tablet; Take 1 tablet (10 mg total) by mouth daily.   I have discontinued Debra Miranda's moxifloxacin, neomycin-polymyxin b-dexamethasone, and calcium-vitamin D. I have also changed her metFORMIN. Additionally, I am having her maintain her Vitamin D, atorvastatin, CINNAMON PO, Fish Oil, and escitalopram.  Meds ordered this encounter  Medications  . metFORMIN (GLUCOPHAGE) 500 MG tablet    Sig: Take 1 tablet (500 mg total) by mouth 2 (two) times daily with a meal.    Dispense:  180 tablet    Refill:  3  . escitalopram (LEXAPRO) 10 MG tablet    Sig: Take 1 tablet (10 mg total) by mouth daily.    Dispense:  90 tablet    Refill:  1     Follow-up: Return in about 3 months (around 02/18/2017).  Debra Miranda, M.D.

## 2016-11-18 NOTE — Patient Instructions (Signed)

## 2017-02-19 ENCOUNTER — Ambulatory Visit: Payer: Medicare PPO | Admitting: Family Medicine

## 2017-02-24 ENCOUNTER — Encounter: Payer: Self-pay | Admitting: Family Medicine

## 2017-03-10 ENCOUNTER — Encounter: Payer: Self-pay | Admitting: Family Medicine

## 2017-03-10 ENCOUNTER — Ambulatory Visit: Payer: Medicare PPO | Admitting: Family Medicine

## 2017-03-10 VITALS — BP 111/68 | HR 81 | Temp 98.1°F | Ht 66.0 in | Wt 185.0 lb

## 2017-03-10 DIAGNOSIS — E119 Type 2 diabetes mellitus without complications: Secondary | ICD-10-CM

## 2017-03-10 DIAGNOSIS — F338 Other recurrent depressive disorders: Secondary | ICD-10-CM | POA: Diagnosis not present

## 2017-03-10 LAB — BAYER DCA HB A1C WAIVED: HB A1C: 9.2 % — AB (ref <7.0)

## 2017-03-10 MED ORDER — ESCITALOPRAM OXALATE 10 MG PO TABS: 10.0000 mg | ORAL_TABLET | Freq: Every day | ORAL | 1 refills | Status: DC

## 2017-03-10 NOTE — Progress Notes (Signed)
Subjective:  Patient ID: Debra Miranda,  female    DOB: 1945-07-28  Age: 72 y.o.    CC: Diabetes (pt here today for routine follow up of Debra Miranda chronic medical conditions)   HPI Debra Miranda presents for  follow-up of hypertension. Patient has no history of headache chest pain or shortness of breath or recent cough. Patient also denies symptoms of TIA such as numbness weakness lateralizing. Patient does not check blood pressure at home.  Patient denies side effects from medication. States taking it regularly.  Patient also  in for follow-up of elevated cholesterol. Doing well without complaints on current medication. Denies side effects of statin including myalgia and arthralgia and nausea. Also in today for liver function testing. Currently no chest pain, shortness of breath or other cardiovascular related symptoms noted.  Follow-up of diabetes. Patient does not check blood sugar at all.  Patient denies symptoms such as polyuria, polydipsia, excessive hunger, nausea No significant hypoglycemic spells noted. Medications reviewed. Pt reports taking them regularly. Pt. denies complication/adverse reaction today.    History Debra Miranda has a past medical history of Allergy, Depression, Diabetes mellitus without complication ([place]), and Orthostatic hypotension.   Debra Miranda has a past surgical history that includes Tonsillectomy and Lumbar disc surgery.   Debra Miranda family history includes Alzheimer's disease in Debra Miranda brother; Cancer in Debra Miranda mother; Diabetes in Debra Miranda brother; Pulmonary fibrosis in Debra Miranda brother; Stroke in Debra Miranda father.Debra Miranda reports that Debra Miranda has been smoking cigarettes.  Debra Miranda has smoked for the past 45.00 years. Debra Miranda has never used smokeless tobacco. Debra Miranda reports that Debra Miranda does not drink alcohol or use drugs.  Current Outpatient Medications on File Prior to Visit  Medication Sig Dispense Refill  . atorvastatin (LIPITOR) 20 MG tablet Take 1 tablet (20 mg total) by mouth daily. 90 tablet 3  . Calcium  Carbonate-Vit D-Min (CALCIUM 1200 PO) Take by mouth.    . Cholecalciferol (VITAMIN D) 2000 UNITS CAPS Take 2,000 Units by mouth daily.    Marland Kitchen CINNAMON PO Take 1 tablet by mouth daily.    . metFORMIN (GLUCOPHAGE) 500 MG tablet Take 1 tablet (500 mg total) by mouth 2 (two) times daily with a meal. 180 tablet 3  . Omega-3 Fatty Acids (FISH OIL) 1000 MG CAPS Take 1 capsule by mouth daily.     No current facility-administered medications on file prior to visit.     ROS Review of Systems  Constitutional: Negative for activity change, appetite change and fever.  HENT: Negative for congestion, rhinorrhea and sore throat.   Eyes: Negative for visual disturbance.  Respiratory: Negative for cough and shortness of breath.   Cardiovascular: Negative for chest pain and palpitations.  Gastrointestinal: Negative for abdominal pain, diarrhea and nausea.  Genitourinary: Negative for dysuria.  Musculoskeletal: Negative for arthralgias and myalgias.  Psychiatric/Behavioral: Positive for dysphoric mood (Debra Miranda feels this is related to the short days of January and should get better by spring but for now Debra Miranda is feeling rather sad low down and disinterested.). The patient is nervous/anxious.     Objective:  BP 111/68   Pulse 81   Temp 98.1 F (36.7 C) (Oral)   Ht '5\' 6"'  (1.676 m)   Wt 185 lb (83.9 kg)   BMI 29.86 kg/m   BP Readings from Last 3 Encounters:  03/10/17 111/68  11/18/16 116/67  08/12/16 116/63    Wt Readings from Last 3 Encounters:  03/10/17 185 lb (83.9 kg)  11/18/16 188 lb (85.3 kg)  08/17/16 185 lb (  83.9 kg)     Physical Exam  Constitutional: Debra Miranda is oriented to person, place, and time. Debra Miranda appears well-developed and well-nourished. No distress.  HENT:  Head: Normocephalic and atraumatic.  Right Ear: External ear normal.  Left Ear: External ear normal.  Nose: Nose normal.  Mouth/Throat: Oropharynx is clear and moist.  Eyes: Conjunctivae and EOM are normal. Pupils are equal,  round, and reactive to light.  Neck: Normal range of motion. Neck supple. No thyromegaly present.  Cardiovascular: Normal rate, regular rhythm and normal heart sounds.  No murmur heard. Pulmonary/Chest: Effort normal and breath sounds normal. No respiratory distress. Debra Miranda has no wheezes. Debra Miranda has no rales.  Abdominal: Soft. Bowel sounds are normal. Debra Miranda exhibits no distension. There is no tenderness.  Lymphadenopathy:    Debra Miranda has no cervical adenopathy.  Neurological: Debra Miranda is alert and oriented to person, place, and time. Debra Miranda has normal reflexes.  Skin: Skin is warm and dry.  Psychiatric: Debra Miranda has a normal mood and affect. Debra Miranda behavior is normal. Judgment and thought content normal.    Assessment & Plan:   Debra Miranda was seen today for diabetes.  Diagnoses and all orders for this visit:  Controlled type 2 diabetes mellitus without complication, without long-term current use of insulin (HCC) -     CMP14+EGFR -     Bayer DCA Hb A1c Waived  Seasonal affective disorder (HCC)  Other orders -     escitalopram (LEXAPRO) 10 MG tablet; Take 1 tablet (10 mg total) by mouth daily.   I am having Debra Miranda maintain Debra Miranda Vitamin D, atorvastatin, CINNAMON PO, Fish Oil, metFORMIN, Calcium Carbonate-Vit D-Min (CALCIUM 1200 PO), and escitalopram.  Meds ordered this encounter  Medications  . escitalopram (LEXAPRO) 10 MG tablet    Sig: Take 1 tablet (10 mg total) by mouth daily.    Dispense:  90 tablet    Refill:  1   Check glucose before eating in the morning and again two hours after supper. Write down the results on the glucose log sheet. Bring the log with you to the next appointment Follow-up: Return in about 3 months (around 06/08/2017).  Debra Miranda, M.D.

## 2017-03-11 ENCOUNTER — Other Ambulatory Visit: Payer: Self-pay | Admitting: Family Medicine

## 2017-03-11 LAB — CMP14+EGFR
A/G RATIO: 1.7 (ref 1.2–2.2)
ALBUMIN: 4.7 g/dL (ref 3.6–4.8)
ALT: 11 IU/L (ref 0–32)
AST: 13 IU/L (ref 0–40)
Alkaline Phosphatase: 120 IU/L — ABNORMAL HIGH (ref 39–117)
BILIRUBIN TOTAL: 0.4 mg/dL (ref 0.0–1.2)
BUN / CREAT RATIO: 17 (ref 12–28)
BUN: 11 mg/dL (ref 8–27)
CHLORIDE: 98 mmol/L (ref 96–106)
CO2: 21 mmol/L (ref 20–29)
Calcium: 9.8 mg/dL (ref 8.7–10.3)
Creatinine, Ser: 0.66 mg/dL (ref 0.57–1.00)
GFR calc non Af Amer: 90 mL/min/{1.73_m2} (ref >59)
GFR, EST AFRICAN AMERICAN: 104 mL/min/{1.73_m2} (ref >59)
GLOBULIN, TOTAL: 2.7 g/dL (ref 1.5–4.5)
Glucose: 178 mg/dL — ABNORMAL HIGH (ref 65–99)
POTASSIUM: 4.4 mmol/L (ref 3.5–5.2)
SODIUM: 138 mmol/L (ref 134–144)
TOTAL PROTEIN: 7.4 g/dL (ref 6.0–8.5)

## 2017-03-11 MED ORDER — SITAGLIPTIN PHOSPHATE 100 MG PO TABS: 100.0000 mg | ORAL_TABLET | Freq: Every day | ORAL | 2 refills | Status: DC

## 2017-05-06 DIAGNOSIS — Z961 Presence of intraocular lens: Secondary | ICD-10-CM | POA: Diagnosis not present

## 2017-05-06 DIAGNOSIS — H5201 Hypermetropia, right eye: Secondary | ICD-10-CM | POA: Diagnosis not present

## 2017-06-08 ENCOUNTER — Ambulatory Visit: Payer: Medicare PPO | Admitting: Family Medicine

## 2017-06-14 ENCOUNTER — Other Ambulatory Visit: Payer: Self-pay | Admitting: Pharmacist

## 2017-06-14 NOTE — Patient Outreach (Addendum)
[place] [facility]) Care Management  06/14/2017  Debra Miranda [birth year] [id number]   Patient was called regarding diabetes management and control as requested by [facility]. Unfortunately, patient did not answer the phone.  HIPAA compliant message left on her voicemail.    Findings: Patient's HgA1c was last 9.2 (03/10/17)  It was 8.8 previously.  Dr. Livia Snellen added Debra Miranda to her medication regimen. Unfortunately, Januvia was filed on 03/11/17 and put on hold 04/17/17 (it was never picked up).  It is unclear if the patient had a deductible or if Januvia was cost prohibitive.  Plan: Call patient back in 3-5 business days.   Elayne Guerin, PharmD, [place] Clinical Pharmacist [phone]

## 2017-06-30 ENCOUNTER — Ambulatory Visit: Payer: Self-pay | Admitting: Pharmacist

## 2017-06-30 ENCOUNTER — Other Ambulatory Visit: Payer: Self-pay | Admitting: Pharmacist

## 2017-06-30 NOTE — Patient Outreach (Addendum)
[place] [facility]) Care Management  06/30/2017  Debra Miranda [birth year] [id number]   Patient was called regarding diabetes management and control as requested by [facility]. Unfortunately, patient did not answer the phone. A man answered the phone and said she was not there. Due to a language barrier, her was unable to answer the phone. Today was the second outreach for the patient.  Previous Findings: Patient's HgA1c was last 9.2 (03/10/17)  It was 8.8 previously.  Dr. Livia Snellen added Celesta Gentile to her medication regimen. Unfortunately, Januvia was filed on 03/11/17 and put on hold 04/17/17 (it was never picked up).  It is unclear if the patient had a deductible or if Januvia was cost prohibitive.  Plan: Call patient back in 3-5 business days.   Elayne Guerin, PharmD, [place] Clinical Pharmacist [phone]

## 2017-07-07 ENCOUNTER — Ambulatory Visit: Payer: Self-pay | Admitting: Pharmacist

## 2017-07-07 ENCOUNTER — Other Ambulatory Visit: Payer: Self-pay | Admitting: Pharmacist

## 2017-07-07 NOTE — Patient Outreach (Signed)
[place] [facility]) Care Management  07/07/2017  Debra Miranda 1946-02-07 [id number]   Patient was called regarding diabetes management and control as requested by [facility]. Unfortunately, patient did not answer the phone. A man answered the phone and said she was not there. Due to a language barrier, her was unable to answer the phone. Today was the third and final outreach to the patient.  Plan: Resolve pharmacy episode and remove myself from the care team due to inability to contact.  Elayne Guerin, PharmD, [place] Clinical Pharmacist [phone]

## 2017-07-29 ENCOUNTER — Ambulatory Visit (INDEPENDENT_AMBULATORY_CARE_PROVIDER_SITE_OTHER): Payer: Medicare PPO | Admitting: *Deleted

## 2017-07-29 ENCOUNTER — Encounter: Payer: Self-pay | Admitting: *Deleted

## 2017-07-29 VITALS — BP 147/87 | HR 71 | Ht 65.25 in | Wt 187.0 lb

## 2017-07-29 DIAGNOSIS — Z23 Encounter for immunization: Secondary | ICD-10-CM | POA: Diagnosis not present

## 2017-07-29 DIAGNOSIS — Z0000 Encounter for general adult medical examination without abnormal findings: Secondary | ICD-10-CM | POA: Diagnosis not present

## 2017-07-29 DIAGNOSIS — Z1211 Encounter for screening for malignant neoplasm of colon: Secondary | ICD-10-CM

## 2017-07-29 NOTE — Patient Instructions (Signed)
Debra Miranda , Thank you for taking time to come for your Medicare Wellness Visit. I appreciate your ongoing commitment to your health goals. Please review the following plan we discussed and let me know if I can assist you in the future.   These are the goals we discussed: Goals    . Exercise 150 min/wk Moderate Activity       This is a list of the screening recommended for you and due dates:  Health Maintenance  Topic Date Due  .  Hepatitis C: One time screening is recommended by Center for Disease Control  (CDC) for  adults born from [birth year] through 1963.   1945/04/12  . Colon Cancer Screening  04/03/1997  . Pneumonia vaccines (2 of 2 - PPSV23) 03/17/2014  . Tetanus Vaccine  07/30/2018*  . Stool Blood Test  07/30/2017  . Eye exam for diabetics  08/06/2017  . Complete foot exam   08/12/2017  . Urine Protein Check  08/12/2017  . Hemoglobin A1C  09/07/2017  . Flu Shot  09/16/2017  . DEXA scan (bone density measurement)  07/28/2018  . Mammogram  07/29/2018  *Topic was postponed. The date shown is not the original due date.    Pneumococcal Polysaccharide Vaccine: What You Need to Know 1. Why get vaccinated? Vaccination can protect older adults (and some children and younger adults) from pneumococcal disease. Pneumococcal disease is caused by bacteria that can spread from person to person through close contact. It can cause ear infections, and it can also lead to more serious infections of the:  Lungs (pneumonia),  Blood (bacteremia), and  Covering of the brain and spinal cord (meningitis). Meningitis can cause deafness and brain damage, and it can be fatal.  Anyone can get pneumococcal disease, but children under 73 years of age, people with certain medical conditions, adults over 46 years of age, and cigarette smokers are at the highest risk. About 18,000 older adults die each year from pneumococcal disease in the [place]. Treatment of pneumococcal infections with penicillin  and other drugs used to be more effective. But some strains of the disease have become resistant to these drugs. This makes prevention of the disease, through vaccination, even more important. 2. Pneumococcal polysaccharide vaccine (PPSV23) Pneumococcal polysaccharide vaccine (PPSV23) protects against 23 types of pneumococcal bacteria. It will not prevent all pneumococcal disease. PPSV23 is recommended for:  All adults 85 years of age and older,  Anyone 2 through 70 years of age with certain long-term health problems,  Anyone 2 through 70 years of age with a weakened immune system,  Adults 60 through 70 years of age who smoke cigarettes or have asthma.  Most people need only one dose of PPSV. A second dose is recommended for certain high-risk groups. People 1 and older should get a dose even if they have gotten one or more doses of the vaccine before they turned 65. Your healthcare provider can give you more information about these recommendations. Most healthy adults develop protection within 2 to 3 weeks of getting the shot. 3. Some people should not get this vaccine  Anyone who has had a life-threatening allergic reaction to PPSV should not get another dose.  Anyone who has a severe allergy to any component of PPSV should not receive it. Tell your provider if you have any severe allergies.  Anyone who is moderately or severely ill when the shot is scheduled may be asked to wait until they recover before getting the vaccine. Someone with a  mild illness can usually be vaccinated.  Children less than 8 years of age should not receive this vaccine.  There is no evidence that PPSV is harmful to either a pregnant woman or to her fetus. However, as a precaution, women who need the vaccine should be vaccinated before becoming pregnant, if possible. 4. Risks of a vaccine reaction With any medicine, including vaccines, there is a chance of side effects. These are usually mild and go away on  their own, but serious reactions are also possible. About half of people who get PPSV have mild side effects, such as redness or pain where the shot is given, which go away within about two days. Less than 1 out of 100 people develop a fever, muscle aches, or more severe local reactions. Problems that could happen after any vaccine:  People sometimes faint after a medical procedure, including vaccination. Sitting or lying down for about 15 minutes can help prevent fainting, and injuries caused by a fall. Tell your doctor if you feel dizzy, or have vision changes or ringing in the ears.  Some people get severe pain in the shoulder and have difficulty moving the arm where a shot was given. This happens very rarely.  Any medication can cause a severe allergic reaction. Such reactions from a vaccine are very rare, estimated at about 1 in a million doses, and would happen within a few minutes to a few hours after the vaccination. As with any medicine, there is a very remote chance of a vaccine causing a serious injury or death. The safety of vaccines is always being monitored. For more information, visit: http://www.aguilar.org/ 5. What if there is a serious reaction? What should I look for? Look for anything that concerns you, such as signs of a severe allergic reaction, very high fever, or unusual behavior. Signs of a severe allergic reaction can include hives, swelling of the face and throat, difficulty breathing, a fast heartbeat, dizziness, and weakness. These would usually start a few minutes to a few hours after the vaccination. What should I do? If you think it is a severe allergic reaction or other emergency that can't wait, call 9-1-1 or get to the nearest hospital. Otherwise, call your doctor. Afterward, the reaction should be reported to the Vaccine Adverse Event Reporting System (VAERS). Your doctor might file this report, or you can do it yourself through the VAERS web site at  www.vaers.SamedayNews.es, or by calling 620-855-1706. VAERS does not give medical advice. 6. How can I learn more?  Ask your doctor. He or she can give you the vaccine package insert or suggest other sources of information.  Call your local or state health department.  Contact the Centers for Disease Control and Prevention (CDC): ? Call 430-519-7140 (1-800-CDC-INFO) or ? Visit CDC's website at http://hunter.com/ CDC Pneumococcal Polysaccharide Vaccine VIS (06/09/13) This information is not intended to replace advice given to you by your health care provider. Make sure you discuss any questions you have with your health care provider. Document Released: 11/30/2005 Document Revised: 10/24/2015 Document Reviewed: 10/24/2015 Elsevier Interactive Patient Education  2017 [employer].

## 2017-07-29 NOTE — Progress Notes (Signed)
Subjective:   Debra Miranda is a 72 y.o. female who presents for a Medicare Annual Wellness Visit. [place] Debra Miranda is a 72 y.o. female who presents for an Initial Medicare Annual Wellness Visit. Ms Fuhriman is retired from [employer]. She lives at home with her husband. She has one daughter and two grandchildren. She enjoys hiking and is active around her home. She push mows her front yard and stays busy working in her house and doing yard work. Has a medium sized dog  Review of Systems    Patient reports that her health is unchanged compared to last year.  Cardiac Risk Factors include: advanced age (>64men, >65 women);dyslipidemia;diabetes mellitus   Other systems negative unless otherwise noted in other area.      Current Medications (verified) Outpatient Encounter Medications as of 07/29/2017  Medication Sig  . atorvastatin (LIPITOR) 20 MG tablet Take 1 tablet (20 mg total) by mouth daily.  . Calcium Carbonate-Vit D-Min (CALCIUM 1200 PO) Take by mouth.  . Cholecalciferol (VITAMIN D) 2000 UNITS CAPS Take 2,000 Units by mouth daily.  Marland Kitchen CINNAMON PO Take 1 tablet by mouth daily.  Marland Kitchen escitalopram (LEXAPRO) 10 MG tablet Take 1 tablet (10 mg total) by mouth daily.  . metFORMIN (GLUCOPHAGE) 500 MG tablet Take 1 tablet (500 mg total) by mouth 2 (two) times daily with a meal.  . Omega-3 Fatty Acids (FISH OIL) 1000 MG CAPS Take 1 capsule by mouth daily.  . sitaGLIPtin (JANUVIA) 100 MG tablet Take 1 tablet (100 mg total) by mouth daily.   No facility-administered encounter medications on file as of 07/29/2017.     Allergies (verified) Latex   History: Past Medical History:  Diagnosis Date  . Allergy   . Depression    many years ago, no meds  . Diabetes mellitus without complication ([place])   . Orthostatic hypotension    Past Surgical History:  Procedure Laterality Date  . LUMBAR DISC SURGERY    . TONSILLECTOMY     as a child   Family History  Problem Relation  Age of Onset  . Cancer Mother        cervical, metastasis  . Stroke Father   . Pulmonary fibrosis Brother   . Alzheimer's disease Brother   . Diabetes Brother   . Colon cancer Neg Hx    Social History   Socioeconomic History  . Marital status: Married    Spouse name: Not on file  . Number of children: 1  . Years of education: 100  . Highest education level: Associate degree: occupational, Hotel manager, or vocational program  Occupational History  . Occupation: Retired    Comment: Data processing manager Needs  . Financial resource strain: Not hard at all  . Food insecurity:    Worry: Never true    Inability: Never true  . Transportation needs:    Medical: No    Non-medical: No  Tobacco Use  . Smoking status: Current Every Day Smoker    Years: 45.00    Types: Cigars    Last attempt to quit: 07/29/2010    Years since quitting: 7.0  . Smokeless tobacco: Never Used  . Tobacco comment: 4 small cigars a day  Substance and Sexual Activity  . Alcohol use: No  . Drug use: No  . Sexual activity: Never  Lifestyle  . Physical activity:    Days per week: 7 days    Minutes per session: 120 min  . Stress:  Only a little  Relationships  . Social connections:    Talks on phone: More than three times a week    Gets together: More than three times a week    Attends religious service: More than 4 times per year    Active member of club or organization: Yes    Attends meetings of clubs or organizations: Never    Relationship status: Married  Other Topics Concern  . Not on file  Social History Narrative  . Not on file    Tobacco Use No.  Clinical Intake:  Pre-visit preparation completed: No        Nutritional Status: BMI 25 -29 Overweight Nutritional Risks: Non-healing wound Diabetes: No  How often do you need to have someone help you when you read instructions, pamphlets, or other written materials from your doctor or pharmacy?: 1 - Never What is the last grade level  you completed in school?: [employer]   Interpreter Needed?: No  Information entered by :: Chong Sicilian, RN   Activities of Daily Living In your present state of health, do you have any difficulty performing the following activities: 07/29/2017  Hearing? N  Vision? N  Comment recent eye exam. had cataract removed 1 year ago  Difficulty concentrating or making decisions? Y  Comment has noticed some difficulty with remembering what's she's going to do  Walking or climbing stairs? N  Dressing or bathing? N  Doing errands, shopping? N  Preparing Food and eating ? N  Using the Toilet? N  In the past six months, have you accidently leaked urine? Y  Comment some difficulty with stress incontinence and some increase in frequency  Do you have problems with loss of bowel control? N  Managing your Medications? N  Comment uses a pill box  Managing your Finances? N  Housekeeping or managing your Housekeeping? N  Some recent data might be hidden     Immunizations and Health Maintenance Immunization History  Administered Date(s) Administered  . Influenza, High Dose Seasonal PF 11/29/2015, 11/18/2016  . Influenza,inj,Quad PF,6+ Mos 12/07/2013, 11/12/2014  . Pneumococcal Conjugate-13 03/17/2013  . Pneumococcal Polysaccharide-23 07/29/2017   Health Maintenance Due  Topic Date Due  . Hepatitis C Screening  1945/03/05  . COLONOSCOPY  04/03/1997  . COLON CANCER SCREENING ANNUAL FOBT  07/30/2017    Diet Eats 3 meals a day Eats out some and prepares some Drinks coffee and some water Drinks no sugar gatorade   Exercise Current Exercise Habits: Home exercise routine, Type of exercise: walking(yard work and hiking), Time (Minutes): 60, Frequency (Times/Week): 7, Weekly Exercise (Minutes/Week): 420, Intensity: Moderate, Exercise limited by: Other - see comments   Depression Screen PHQ 2/9 Scores 07/29/2017 03/10/2017 11/18/2016 08/12/2016 11/29/2015 09/09/2015 03/12/2015  PHQ - 2  Score 0 0 0 0 0 1 0     Fall Risk Fall Risk  07/29/2017 03/10/2017 11/18/2016 08/12/2016 11/29/2015  Falls in the past year? Yes No No No No  Number falls in past yr: 1 - - - -  Injury with Fall? No - - - -  Risk for fall due to : History of fall(s) - - - -  Risk for fall due to: Comment works out in [employer] and yard a lot and has tripped over logs - - - -  Follow up Falls prevention discussed - - - -    Safety Is the patient's home free of loose throw rugs in walkways, pet beds, electrical cords, etc?  yes      Grab bars in the bathroom? no      Walkin shower? no      Shower Seat? no      Handrails on the stairs?   no      Adequate lighting?   no  Patient Care Team: [name], DO as PCP - General (Family Medicine)  Cataract removed in right eye-Dr Deon Pilling. No other surgeries, hospitalizations, or ER visits.   Objective:    Today's Vitals   07/29/17 1110  BP: (!) 147/87  Pulse: 71  Weight: 187 lb (84.8 kg)  Height: 5' 5.25" (1.657 m)   Body mass index is 30.88 kg/m.  Advanced Directives 07/29/2017 07/27/2016  Does Patient Have a Medical Advance Directive? No Yes  Type of Advance Directive - Living will;Healthcare Power of Attorney  Does patient want to make changes to medical advance directive? - No - Patient declined  Copy of [place] in Chart? - No - copy requested  Would patient like information on creating a medical advance directive? No - Patient declined -    Hearing/Vision  normal or No deficits noted during visit.  Cognitive Function: MMSE - Mini Mental State Exam 07/29/2017 07/27/2016  Orientation to time 5 5  Orientation to Place 5 5  Registration 3 3  Attention/ Calculation 5 5  Recall 3 3  Language- name 2 objects 2 2  Language- repeat 1 1  Language- follow 3 step command 3 3  Language- read & follow direction 1 1  Write a sentence 1 1  Copy design 1 1  Total score 30 30       Normal Cognitive Function Screening:  Yes      Assessment:   This is a routine wellness examination for [facility].    Plan:    Goals    . Exercise 150 min/wk Moderate Activity       Keep f/u with [name], DO and any other specialty appointments you may have Continue current medications Move carefully to avoid falls. Use assistive devices like a can or walker if needed. Aim for at least 150 minutes of moderate activity a week. This can be done with chair exercises if necessary. Read or work on puzzles daily Stay connected with friends and family  Health Maintenance: Tdap Vaccine recommended: yes Zostavax (Shingles vaccine) recommended:no Prevnar or Pneumovax (pneumonia vaccines) recommended:no  Cancer Screenings: Lung: Low Dose CT Chest recommended if Age 63-80 years, 30 pack-year currently smoking OR have quit w/in 15years. Patient does qualify. Colon cancer screening recommended: yes-colonoscopy Mammogram recommended:yes Pap Smear recommended: not applicable  Additional Screenings Hepatitis C Screening recommended: yes Dexa Scan recommended: no Diabetic Eye Exam recommended: yes  Orders Placed This Encounter  Procedures  . Pneumococcal polysaccharide vaccine 23-valent greater than or equal to 2yo subcutaneous/IM  . Ambulatory referral to Gastroenterology    Referral Priority:   Routine    Referral Type:   Consultation    Referral Reason:   Specialty Services Required    Number of Visits Requested:   1    I have personally reviewed and noted the following in the patient's chart:   . Medical and social history . Use of alcohol, tobacco or illicit drugs  . Current medications and supplements . Functional ability and status . Nutritional status . Physical activity . Advanced directives . List of other physicians . Hospitalizations, surgeries, and ER visits in previous 12 months . Vitals . Screenings to  include cognitive, depression, and falls . Referrals and appointments  In addition, I  have reviewed and discussed with patient certain preventive protocols, quality metrics, and best practice recommendations. A written personalized care plan for preventive services as well as general preventive health recommendations were provided to patient.     Chong Sicilian, RN   07/29/2017

## 2017-07-30 NOTE — Progress Notes (Signed)
Subjective: Debra sided abdominal pain PCP: Debra Norlander, DO [id number] [name] is a 72 y.o. female presenting to clinic today for:  1. Right sided abdominal pain Patient reports onset of right-sided abdominal pain about 2 to 3 weeks ago.  She notes that it started after she was sitting and working in her garden for about 4-5 hours.  She describes the pain as achy and is accompanied by right-sided abdominal swelling.  Denies any associated nausea, vomiting, difficulty stooling, hematochezia or melena.  No constipation.  She is having normal bowel movements.  No fevers.  She is tolerating oral intake without difficulty.  She does note a history of umbilical hernia that she is had for about 9 years.  She does not feel that the hernia is getting any larger.  2. Tobacco use disorder Patient with 30 pack year history of cigarette use and she stopped smoking cigarettes in 2012.  She is currently a 4 cigar/day smoker.  She qualifies for low dose CT chest for lung cancer screening and wishes to have this done.  No hemoptysis or unplanned weight loss.   ROS: Per HPI  Allergies  Allergen Reactions  . Latex    Past Medical History:  Diagnosis Date  . Allergy   . Depression    many years ago, no meds  . Diabetes mellitus without complication ([place])   . Orthostatic hypotension     Current Outpatient Medications:  .  atorvastatin (LIPITOR) 20 MG tablet, Take 1 tablet (20 mg total) by mouth daily., Disp: 90 tablet, Rfl: 3 .  Calcium Carbonate-Vit D-Min (CALCIUM 1200 PO), Take by mouth., Disp: , Rfl:  .  Cholecalciferol (VITAMIN D) 2000 UNITS CAPS, Take 2,000 Units by mouth daily., Disp: , Rfl:  .  CINNAMON PO, Take 1 tablet by mouth daily., Disp: , Rfl:  .  escitalopram (LEXAPRO) 10 MG tablet, Take 1 tablet (10 mg total) by mouth daily., Disp: 90 tablet, Rfl: 1 .  metFORMIN (GLUCOPHAGE) 500 MG tablet, Take 1 tablet (500 mg total) by mouth 2 (two) times daily with a meal., Disp: 180  tablet, Rfl: 3 .  Omega-3 Fatty Acids (FISH OIL) 1000 MG CAPS, Take 1 capsule by mouth daily., Disp: , Rfl:  .  sitaGLIPtin (JANUVIA) 100 MG tablet, Take 1 tablet (100 mg total) by mouth daily., Disp: 30 tablet, Rfl: 2 Social History   Socioeconomic History  . Marital status: Married    Spouse name: Not on file  . Number of children: 1  . Years of education: 50  . Highest education level: Associate degree: occupational, Hotel manager, or vocational program  Occupational History  . Occupation: Retired    Comment: Data processing manager Needs  . Financial resource strain: Not hard at all  . Food insecurity:    Worry: Never true    Inability: Never true  . Transportation needs:    Medical: No    Non-medical: No  Tobacco Use  . Smoking status: Current Every Day Smoker    Years: 45.00    Types: Cigars    Last attempt to quit: 07/29/2010    Years since quitting: 7.0  . Smokeless tobacco: Never Used  . Tobacco comment: 4 small cigars a day  Substance and Sexual Activity  . Alcohol use: No  . Drug use: No  . Sexual activity: Never  Lifestyle  . Physical activity:    Days per week: 7 days    Minutes per session: 120 min  . Stress:  Only a little  Relationships  . Social connections:    Talks on phone: More than three times a week    Gets together: More than three times a week    Attends religious service: More than 4 times per year    Active member of club or organization: Yes    Attends meetings of clubs or organizations: Never    Relationship status: Married  . Intimate partner violence:    Fear of current or ex partner: No    Emotionally abused: No    Physically abused: No    Forced sexual activity: Not on file  Other Topics Concern  . Not on file  Social History Narrative  . Not on file   Family History  Problem Relation Age of Onset  . Cancer Mother        cervical, metastasis  . Stroke Father   . Pulmonary fibrosis Brother   . Alzheimer's disease Brother   .  Diabetes Brother   . Colon cancer Neg Hx     Objective: Office vital signs reviewed. BP 124/75   Pulse 74   Temp 97.8 F (36.6 C) (Oral)   Ht 5\' 6"  (1.676 m)   Wt 186 lb (84.4 kg)   BMI 30.02 kg/m   Physical Examination:  General: Awake, alert, well appearing female, No acute distress HEENT: Normal    Eyes: Extraocular membranes intact, sclera white    Throat: moist mucus membranes Cardio: regular rate and rhythm, S1S2 heard, no murmurs appreciated Pulm: clear to auscultation bilaterally, no wheezes, rhonchi or rales; normal work of breathing on room air GI: soft, non-tender, non-distended, bowel sounds present x4, she has a large umbilical hernia that easily reduces. GU: no palpable masses externally  Assessment/ Plan: 72 y.o. female   1. Right-sided abdominal pain of unknown cause Possibly related to umbilical hernia.  Will obtain CT abdomen pelvis to further evaluate.  Patient is amenable to seeing a general surgeon for hernia repair if this is the cause of her daily right sided abdominal pain.  No evidence of gangrene or obstruction at this time.  However, signs and symptoms of obstruction and other emergent properties of strangulate hernia were reviewed with the patient.  She was good understanding.  She will follow-up in 1 month for diabetes. - CT Abdomen Pelvis Wo Contrast; Future  2. Umbilical hernia without obstruction and without gangrene - CT Abdomen Pelvis Wo Contrast; Future  3. Tobacco use disorder   4. Encounter for screening for malignant neoplasm of respiratory organs Patient with greater than 30-pack-year history of smoking cigarettes.  She is actually stopped smoking cigarettes in 2012 but continues to smoke 4 cigars/day.  Will obtain low-dose CT chest to screen for lung cancer as per guidelines.  If this is able to be obtained at the same time as the abdominal CT, patient would desire this. - CT CHEST LUNG CA SCREEN LOW DOSE W/O CM; Future   Orders  Placed This Encounter  Procedures  . CT Abdomen Pelvis Wo Contrast    Standing Status:   Future    Standing Expiration Date:   11/03/2018    Order Specific Question:   Preferred imaging location?    Answer:   [facility]    Order Specific Question:   Is Oral Contrast requested for this exam?    Answer:   Yes, Per Radiology protocol    Order Specific Question:   Radiology Contrast Protocol - do NOT remove file path  Answer:   [email or web address]  . CT CHEST LUNG CA SCREEN LOW DOSE W/O CM    Standing Status:   Future    Standing Expiration Date:   10/03/2018    Order Specific Question:   Reason for Exam (SYMPTOM  OR DIAGNOSIS REQUIRED)    Answer:   >30 pack year history of smoking.    Order Specific Question:   Preferred Imaging Location?    Answer:   [facility] [facility]    Order Specific Question:   Radiology Contrast Protocol - do NOT remove file path    Answer:   [email or web address]    Debra Miranda, [place] [phone]

## 2017-08-02 ENCOUNTER — Encounter: Payer: Self-pay | Admitting: Family Medicine

## 2017-08-02 ENCOUNTER — Ambulatory Visit: Payer: Medicare PPO | Admitting: Family Medicine

## 2017-08-02 VITALS — BP 124/75 | HR 74 | Temp 97.8°F | Ht 66.0 in | Wt 186.0 lb

## 2017-08-02 DIAGNOSIS — Z122 Encounter for screening for malignant neoplasm of respiratory organs: Secondary | ICD-10-CM | POA: Diagnosis not present

## 2017-08-02 DIAGNOSIS — K429 Umbilical hernia without obstruction or gangrene: Secondary | ICD-10-CM | POA: Diagnosis not present

## 2017-08-02 DIAGNOSIS — R109 Unspecified abdominal pain: Secondary | ICD-10-CM | POA: Diagnosis not present

## 2017-08-02 DIAGNOSIS — F17200 Nicotine dependence, unspecified, uncomplicated: Secondary | ICD-10-CM | POA: Diagnosis not present

## 2017-08-02 NOTE — Patient Instructions (Addendum)
See me back in 2-4 weeks for your diabetes.  Make sure that you are taking your medications as directed.  Make sure that you get your diabetic eye exam done with your eye doctor.   You will be contacted to schedule the CT   Umbilical Hernia, Adult A hernia is a bulge of tissue that pushes through an opening between muscles. An umbilical hernia happens in the abdomen, near the belly button (umbilicus). The hernia may contain tissues from the small intestine, large intestine, or fatty tissue covering the intestines (omentum). Umbilical hernias in adults tend to get worse over time, and they require surgical treatment. There are several types of umbilical hernias. You may have:  A hernia located just above or below the umbilicus (indirect hernia). This is the most common type of umbilical hernia in adults.  A hernia that forms through an opening formed by the umbilicus (direct hernia).  A hernia that comes and goes (reducible hernia). A reducible hernia may be visible only when you strain, lift something heavy, or cough. This type of hernia can be pushed back into the abdomen (reduced).  A hernia that traps abdominal tissue inside the hernia (incarcerated hernia). This type of hernia cannot be reduced.  A hernia that cuts off blood flow to the tissues inside the hernia (strangulated hernia). The tissues can start to die if this happens. This type of hernia requires emergency treatment.  What are the causes? An umbilical hernia happens when tissue inside the abdomen presses on a weak area of the abdominal muscles. What increases the risk? You may have a greater risk of this condition if you:  Are obese.  Have had several pregnancies.  Have a buildup of fluid inside your abdomen (ascites).  Have had surgery that weakens the abdominal muscles.  What are the signs or symptoms? The main symptom of this condition is a painless bulge at or near the belly button. A reducible hernia may be  visible only when you strain, lift something heavy, or cough. Other symptoms may include:  Dull pain.  A feeling of pressure.  Symptoms of a strangulated hernia may include:  Pain that gets increasingly worse.  Nausea and vomiting.  Pain when pressing on the hernia.  Skin over the hernia becoming red or purple.  Constipation.  Blood in the stool.  How is this diagnosed? This condition may be diagnosed based on:  A physical exam. You may be asked to cough or strain while standing. These actions increase the pressure inside your abdomen and force the hernia through the opening in your muscles. Your health care provider may try to reduce the hernia by pressing on it.  Your symptoms and medical history.  How is this treated? Surgery is the only treatment for an umbilical hernia. Surgery for a strangulated hernia is done as soon as possible. If you have a small hernia that is not incarcerated, you may need to lose weight before having surgery. Follow these instructions at home:  Lose weight, if told by your health care provider.  Do not try to push the hernia back in.  Watch your hernia for any changes in color or size. Tell your health care provider if any changes occur.  You may need to avoid activities that increase pressure on your hernia.  Do not lift anything that is heavier than 10 lb (4.5 kg) until your health care provider says that this is safe.  Take over-the-counter and prescription medicines only as told by your  health care provider.  Keep all follow-up visits as told by your health care provider. This is important. Contact a health care provider if:  Your hernia gets larger.  Your hernia becomes painful. Get help right away if:  You develop sudden, severe pain near the area of your hernia.  You have pain as well as nausea or vomiting.  You have pain and the skin over your hernia changes color.  You develop a fever. This information is not intended to  replace advice given to you by your health care provider. Make sure you discuss any questions you have with your health care provider. Document Released: 07/05/2015 Document Revised: 10/06/2015 Document Reviewed: 07/05/2015 Elsevier Interactive Patient Education  [employer].

## 2017-08-04 ENCOUNTER — Encounter: Payer: Self-pay | Admitting: Gastroenterology

## 2017-08-16 DIAGNOSIS — C4491 Basal cell carcinoma of skin, unspecified: Secondary | ICD-10-CM

## 2017-08-16 HISTORY — DX: Basal cell carcinoma of skin, unspecified: C44.91

## 2017-08-22 ENCOUNTER — Other Ambulatory Visit: Payer: Self-pay | Admitting: Family Medicine

## 2017-08-22 DIAGNOSIS — E785 Hyperlipidemia, unspecified: Secondary | ICD-10-CM

## 2017-08-23 NOTE — Telephone Encounter (Signed)
Last lipid 11/16/16

## 2017-08-26 ENCOUNTER — Ambulatory Visit (HOSPITAL_COMMUNITY): Payer: Medicare PPO

## 2017-08-27 ENCOUNTER — Ambulatory Visit: Payer: Medicare PPO

## 2017-08-27 ENCOUNTER — Other Ambulatory Visit: Payer: Self-pay | Admitting: Family Medicine

## 2017-08-27 DIAGNOSIS — E119 Type 2 diabetes mellitus without complications: Secondary | ICD-10-CM

## 2017-08-27 DIAGNOSIS — Z130 Encounter for screening for diseases of the blood and blood-forming organs and certain disorders involving the immune mechanism: Secondary | ICD-10-CM

## 2017-08-27 DIAGNOSIS — M85852 Other specified disorders of bone density and structure, left thigh: Secondary | ICD-10-CM | POA: Diagnosis not present

## 2017-08-27 DIAGNOSIS — E7849 Other hyperlipidemia: Secondary | ICD-10-CM

## 2017-08-27 LAB — BAYER DCA HB A1C WAIVED: HB A1C (BAYER DCA - WAIVED): 8.6 % — ABNORMAL HIGH (ref <7.0)

## 2017-08-27 NOTE — Progress Notes (Signed)
Subjective: CC: side pain, DM2 PCP: [name], DO SJG:[id number] Debra Miranda is a 72 y.o. female presenting to clinic today for:  1. Type 2 Diabetes/ ankle ulcer Patient reports she does not monitor blood sugar at home.  Taking medication(s): Metformin 500 mg p.o. twice daily, Side effects: Currently no side effects but when she is tried to increase to 1000 mg she has had significant GI side effects.  She recently completed a form to see if she can get Januvia covered, as it was several $100 out of pocket.  She is currently awaiting the response, as she never was able to start the medication due to cost.  Last eye exam: 07/2016; plans to schedule soon Last foot exam: 07/2016 Last A1c: 8.6 (08/2017) Nephropathy screen indicated?: needs Last flu, zoster and/or pneumovax: UTD  ROS: denies fever, chills, dizziness, LOC, polyuria, polydipsia, unintended weight loss/gain, foot ulcerations, numbness or tingling in extremities or chest pain.  She does report several calluses on her feet.  She also notes an ulcer along the anterior aspect of the right ankle that has been present for 2 to 3 years.  Denies any significant pain.  She does report some occasional drainage that is clear.  She is tried different types of dressings in efforts to improve this and states that allowing it to air out not be covered by bandage has seem to help with the most.  She was told previously by her primary care doctor that this was a "blood blister".  However, it never has fully healed.  2. Side pain She was seen 1 month ago for right-sided abdominal pain of unknown cause.  There was concern for possible involvement of the hernia and therefore CT abdomen pelvis was ordered.  She has CT abdomen pelvis scheduled for Wednesday.  She also has an appointment with gastroenterology on 10/07/2017 for evaluation.  She notes that symptoms are pretty much stable from previous.  No changes.  She is tolerating p.o. intake without  difficulty.  No hematochezia or melena.  No vomiting.   ROS: Per HPI  Allergies  Allergen Reactions  . Latex    Past Medical History:  Diagnosis Date  . Allergy   . Depression    many years ago, no meds  . Diabetes mellitus without complication ([place])   . Orthostatic hypotension     Current Outpatient Medications:  .  atorvastatin (LIPITOR) 20 MG tablet, TAKE 1 TABLET BY MOUTH EVERY DAY, Disp: 90 tablet, Rfl: 0 .  Calcium Carbonate-Vit D-Min (CALCIUM 1200 PO), Take by mouth., Disp: , Rfl:  .  Cholecalciferol (VITAMIN D) 2000 UNITS CAPS, Take 2,000 Units by mouth daily., Disp: , Rfl:  .  CINNAMON PO, Take 1 tablet by mouth daily., Disp: , Rfl:  .  escitalopram (LEXAPRO) 10 MG tablet, Take 1 tablet (10 mg total) by mouth daily., Disp: 90 tablet, Rfl: 1 .  metFORMIN (GLUCOPHAGE) 500 MG tablet, Take 1 tablet (500 mg total) by mouth 2 (two) times daily with a meal., Disp: 180 tablet, Rfl: 3 .  Omega-3 Fatty Acids (FISH OIL) 1000 MG CAPS, Take 1 capsule by mouth daily., Disp: , Rfl:  Social History   Socioeconomic History  . Marital status: Married    Spouse name: Not on file  . Number of children: 1  . Years of education: 64  . Highest education level: Associate degree: occupational, Hotel manager, or vocational program  Occupational History  . 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 Financial resource strain: Not hard at all  . Food insecurity:    Worry: Never true    Inability: Never true  . Transportation needs:    Medical: No    Non-medical: No  Tobacco Use  . Smoking status: Current Every Day Smoker    Years: 45.00    Types: Cigars    Last attempt to quit: 07/29/2010    Years since quitting: 7.0  . Smokeless tobacco: Never Used  . Tobacco comment: 4 small cigars a day  Substance and Sexual Activity  . Alcohol use: No  . Drug use: No  . Sexual activity: Never  Lifestyle  . Physical activity:    Days per week: 7 days    Minutes per  session: 120 min  . Stress: Only a little  Relationships  . Social connections:    Talks on phone: More than three times a week    Gets together: More than three times a week    Attends religious service: More than 4 times per year    Active member of club or organization: Yes    Attends meetings of clubs or organizations: Never    Relationship status: Married  . Intimate partner violence:    Fear of current or ex partner: No    Emotionally abused: No    Physically abused: No    Forced sexual activity: Not on file  Other Topics Concern  . Not on file  Social History Narrative  . Not on file   Family History  Problem Relation Age of Onset  . Cancer Mother        cervical, metastasis  . Stroke Father   . Pulmonary fibrosis Brother   . Alzheimer's disease Brother   . Diabetes Brother   . Colon cancer Neg Hx     Objective: Office vital signs reviewed. BP 136/88   Pulse 72   Temp 98.1 F (36.7 Debra) (Oral)   Ht 5\' 6"  (1.676 m)   Wt 186 lb (84.4 kg)   BMI 30.02 kg/m   Physical Examination:  General: Awake, alert, nontoxic, No acute distress Cardio: regular rate and rhythm, S1S2 heard, no murmurs appreciated Pulm: clear to auscultation bilaterally, no wheezes, rhonchi or rales; normal work of breathing on room air Extremities: warm, No edema, cyanosis or clubbing; +1 pedal pulses bilaterally MSK: normal gait and normal station Skin: Circular lesion that appears to be ulcerated with raised borders very suggestive of a possible squamous cell carcinoma.  Scant discharge.  No tenderness to palpation.  No surrounding erythema, induration or fluctuance.  No increased warmth.  It measures approximately 1.5 cm in diameter.  Diabetic Foot Form - Detailed   Diabetic Foot Exam - detailed Diabetic Foot exam was performed with the following findings:  Yes 08/30/2017  3:52 PM  Visual Foot Exam completed.:  Yes  Can the patient see the bottom of their feet?:  Yes Are the shoes  appropriate in style and fit?:  Yes Is there swelling or and abnormal foot shape?:  No Is there a claw toe deformity?:  Yes Is there elevated skin temparature?:  No Is there foot or ankle muscle weakness?:  Yes Normal Range of Motion:  No Pulse Foot Exam completed.:  Yes  Right posterior Tibialias:  Diminished Left posterior Tibialias:  Diminished  Right Dorsalis Pedis:  Diminished Left Dorsalis Pedis:  Diminished  Semmes-Weinstein Monofilament Test R Site 1-Great Toe:  Pos L Site 1-Great Toe:  Pos  Assessment/ Plan: 72 y.o. female   1. Controlled type 2 diabetes mellitus without complication, without long-term current use of insulin (HCC) Goal for age less than 9.  Would prefer to have her closer to 10 if possible.  Her most recent A1c was 8.6 this is down from previous check.  Will obtain urine microalbumin today since she is not on an ACE or ARB.  I have completed the forms for Januvia.  Awaiting to see if this will be covered through the medication assistance program.  If it is not covered, we may need to consider alternative adjunct of medications.  She did not tolerate increased doses of metformin so this would likely not be a very good option for her.  We will plan to follow-up with her in about 3 months or sooner if needed. - Microalbumin / creatinine urine ratio  2. Skin lesion The appearance of the lesion is very concerning for possible squamous cell carcinoma.  I have referred her to dermatology for biopsy/excision of the lesion.  I did discuss with her that this is concerning for possible skin cancer and she understood the need for evaluation by dermatology. - Ambulatory referral to Dermatology  3. Nonhealing skin ulcer, limited to breakdown of skin ([place]) As above - Ambulatory referral to Dermatology  4. Foot callus Callus formations appreciated along bilateral balls of feet left greater than right.  Given decreased vibratory sensation, claw deformity and callus  formation, I have prescribed her diabetic shoes and instructed her to go to the appropriate pharmacy for filling.  5. Claw toe, acquired, right  6. Hyperlipidemia, unspecified hyperlipidemia type Cholesterol medications refilled x1 year. - atorvastatin (LIPITOR) 20 MG tablet; Take 1 tablet (20 mg total) by mouth daily.  Dispense: 90 tablet; Refill: 3  7. Right sided abdominal pain Additionally, awaiting CT abdomen pelvis, which is scheduled 09/01/2017.  She also has follow-up with GI on 8/22.   Orders Placed This Encounter  Procedures  . Microalbumin / creatinine urine ratio  . Ambulatory referral to Dermatology    Referral Priority:   Routine    Referral Type:   Consultation    Referral Reason:   Specialty Services Required    Requested Specialty:   Dermatology    Number of Visits Requested:   1   Meds ordered this encounter  Medications  . atorvastatin (LIPITOR) 20 MG tablet    Sig: Take 1 tablet (20 mg total) by mouth daily.    Dispense:  90 tablet    Refill:  [place], [place] [phone]

## 2017-08-28 LAB — CBC WITH DIFFERENTIAL/PLATELET
BASOS ABS: 0 10*3/uL (ref 0.0–0.2)
BASOS: 0 %
EOS (ABSOLUTE): 0.1 10*3/uL (ref 0.0–0.4)
Eos: 1 %
HEMOGLOBIN: 16 g/dL — AB (ref 11.1–15.9)
Hematocrit: 48.6 % — ABNORMAL HIGH (ref 34.0–46.6)
IMMATURE GRANS (ABS): 0 10*3/uL (ref 0.0–0.1)
Immature Granulocytes: 0 %
LYMPHS ABS: 2.3 10*3/uL (ref 0.7–3.1)
Lymphs: 26 %
MCH: 29.1 pg (ref 26.6–33.0)
MCHC: 32.9 g/dL (ref 31.5–35.7)
MCV: 89 fL (ref 79–97)
MONOCYTES: 6 %
Monocytes Absolute: 0.5 10*3/uL (ref 0.1–0.9)
NEUTROS ABS: 6 10*3/uL (ref 1.4–7.0)
Neutrophils: 67 %
Platelets: 160 10*3/uL (ref 150–450)
RBC: 5.49 x10E6/uL — ABNORMAL HIGH (ref 3.77–5.28)
RDW: 14.5 % (ref 12.3–15.4)
WBC: 8.8 10*3/uL (ref 3.4–10.8)

## 2017-08-28 LAB — LIPID PANEL
CHOLESTEROL TOTAL: 124 mg/dL (ref 100–199)
Chol/HDL Ratio: 2.3 ratio (ref 0.0–4.4)
HDL: 53 mg/dL (ref >39)
LDL CALC: 46 mg/dL (ref 0–99)
Triglycerides: 126 mg/dL (ref 0–149)
VLDL CHOLESTEROL CAL: 25 mg/dL (ref 5–40)

## 2017-08-28 LAB — BASIC METABOLIC PANEL
BUN/Creatinine Ratio: 13 (ref 12–28)
BUN: 8 mg/dL (ref 8–27)
CHLORIDE: 101 mmol/L (ref 96–106)
CO2: 21 mmol/L (ref 20–29)
Calcium: 10.2 mg/dL (ref 8.7–10.3)
Creatinine, Ser: 0.63 mg/dL (ref 0.57–1.00)
GFR calc Af Amer: 105 mL/min/{1.73_m2} (ref >59)
GFR, EST NON AFRICAN AMERICAN: 91 mL/min/{1.73_m2} (ref >59)
GLUCOSE: 192 mg/dL — AB (ref 65–99)
POTASSIUM: 4.6 mmol/L (ref 3.5–5.2)
SODIUM: 137 mmol/L (ref 134–144)

## 2017-08-28 LAB — VITAMIN D 25 HYDROXY (VIT D DEFICIENCY, FRACTURES): VIT D 25 HYDROXY: 31.2 ng/mL (ref 30.0–100.0)

## 2017-08-30 ENCOUNTER — Ambulatory Visit (INDEPENDENT_AMBULATORY_CARE_PROVIDER_SITE_OTHER): Payer: Medicare PPO | Admitting: Family Medicine

## 2017-08-30 ENCOUNTER — Ambulatory Visit (HOSPITAL_COMMUNITY): Payer: Medicare PPO

## 2017-08-30 VITALS — BP 136/88 | HR 72 | Temp 98.1°F | Ht 66.0 in | Wt 186.0 lb

## 2017-08-30 DIAGNOSIS — R109 Unspecified abdominal pain: Secondary | ICD-10-CM | POA: Diagnosis not present

## 2017-08-30 DIAGNOSIS — E785 Hyperlipidemia, unspecified: Secondary | ICD-10-CM | POA: Diagnosis not present

## 2017-08-30 DIAGNOSIS — L98491 Non-pressure chronic ulcer of skin of other sites limited to breakdown of skin: Secondary | ICD-10-CM | POA: Insufficient documentation

## 2017-08-30 DIAGNOSIS — L84 Corns and callosities: Secondary | ICD-10-CM

## 2017-08-30 DIAGNOSIS — L989 Disorder of the skin and subcutaneous tissue, unspecified: Secondary | ICD-10-CM | POA: Diagnosis not present

## 2017-08-30 DIAGNOSIS — E119 Type 2 diabetes mellitus without complications: Secondary | ICD-10-CM | POA: Diagnosis not present

## 2017-08-30 DIAGNOSIS — M205X1 Other deformities of toe(s) (acquired), right foot: Secondary | ICD-10-CM | POA: Insufficient documentation

## 2017-08-30 MED ORDER — ATORVASTATIN CALCIUM 20 MG PO TABS: 20.0000 mg | ORAL_TABLET | Freq: Every day | ORAL | 3 refills | Status: DC

## 2017-08-30 NOTE — Patient Instructions (Addendum)
I prescribed the diabetic shoes.  Let me know if the Januvia goes through, I would like you to start this medicine.  I referred you to dermatology for further evaluation of the skin ulcer on the left side.  You had labs performed today.  You will be contacted with the results of the labs once they are available, usually in the next 3 business days for routine lab work.   Diabetes and Foot Care Diabetes may cause you to have problems because of poor blood supply (circulation) to your feet and legs. This may cause the skin on your feet to become thinner, break easier, and heal more slowly. Your skin may become dry, and the skin may peel and crack. You may also have nerve damage in your legs and feet causing decreased feeling in them. You may not notice minor injuries to your feet that could lead to infections or more serious problems. Taking care of your feet is one of the most important things you can do for yourself. Follow these instructions at home:  Wear shoes at all times, even in the house. Do not go barefoot. Bare feet are easily injured.  Check your feet daily for blisters, cuts, and redness. If you cannot see the bottom of your feet, use a mirror or ask someone for help.  Wash your feet with warm water (do not use hot water) and mild soap. Then pat your feet and the areas between your toes until they are completely dry. Do not soak your feet as this can dry your skin.  Apply a moisturizing lotion or petroleum jelly (that does not contain alcohol and is unscented) to the skin on your feet and to dry, brittle toenails. Do not apply lotion between your toes.  Trim your toenails straight across. Do not dig under them or around the cuticle. File the edges of your nails with an emery board or nail file.  Do not cut corns or calluses or try to remove them with medicine.  Wear clean socks or stockings every day. Make sure they are not too tight. Do not wear knee-high stockings since they may  decrease blood flow to your legs.  Wear shoes that fit properly and have enough cushioning. To break in new shoes, wear them for just a few hours a day. This prevents you from injuring your feet. Always look in your shoes before you put them on to be sure there are no objects inside.  Do not cross your legs. This may decrease the blood flow to your feet.  If you find a minor scrape, cut, or break in the skin on your feet, keep it and the skin around it clean and dry. These areas may be cleansed with mild soap and water. Do not cleanse the area with peroxide, alcohol, or iodine.  When you remove an adhesive bandage, be sure not to damage the skin around it.  If you have a wound, look at it several times a day to make sure it is healing.  Do not use heating pads or hot water bottles. They may burn your skin. If you have lost feeling in your feet or legs, you may not know it is happening until it is too late.  Make sure your health care provider performs a complete foot exam at least annually or more often if you have foot problems. Report any cuts, sores, or bruises to your health care provider immediately. Contact a health care provider if:  You have an  injury that is not healing.  You have cuts or breaks in the skin.  You have an ingrown nail.  You notice redness on your legs or feet.  You feel burning or tingling in your legs or feet.  You have pain or cramps in your legs and feet.  Your legs or feet are numb.  Your feet always feel cold. Get help right away if:  There is increasing redness, swelling, or pain in or around a wound.  There is a red line that goes up your leg.  Pus is coming from a wound.  You develop a fever or as directed by your health care provider.  You notice a bad smell coming from an ulcer or wound. This information is not intended to replace advice given to you by your health care provider. Make sure you discuss any questions you have with your  health care provider. Document Released: 01/31/2000 Document Revised: 07/11/2015 Document Reviewed: 07/12/2012 Elsevier Interactive Patient Education  2017 [employer].

## 2017-08-31 LAB — MICROALBUMIN / CREATININE URINE RATIO
CREATININE, UR: 54.6 mg/dL
Microalb/Creat Ratio: 21.2 mg/g creat (ref 0.0–30.0)
Microalbumin, Urine: 11.6 ug/mL

## 2017-09-01 ENCOUNTER — Ambulatory Visit
Admission: RE | Admit: 2017-09-01 | Discharge: 2017-09-01 | Disposition: A | Discharge status: Home / Self Care | Payer: Medicare PPO | Source: Ambulatory Visit | Attending: Family Medicine | Admitting: Family Medicine

## 2017-09-01 DIAGNOSIS — K429 Umbilical hernia without obstruction or gangrene: Secondary | ICD-10-CM

## 2017-09-01 DIAGNOSIS — R109 Unspecified abdominal pain: Secondary | ICD-10-CM

## 2017-09-01 DIAGNOSIS — K439 Ventral hernia without obstruction or gangrene: Secondary | ICD-10-CM | POA: Diagnosis not present

## 2017-09-03 ENCOUNTER — Other Ambulatory Visit: Payer: Self-pay | Admitting: Family Medicine

## 2017-09-03 ENCOUNTER — Telehealth: Payer: Self-pay | Admitting: Family Medicine

## 2017-09-03 DIAGNOSIS — R935 Abnormal findings on diagnostic imaging of other abdominal regions, including retroperitoneum: Secondary | ICD-10-CM

## 2017-09-03 DIAGNOSIS — K439 Ventral hernia without obstruction or gangrene: Secondary | ICD-10-CM

## 2017-09-03 NOTE — Telephone Encounter (Signed)
I reviewed CT findings with patient.  No real findings to explain why she is having right-sided abdominal pain except for nonobstructing gallstones.  She did have some irregularities within the fundus, which I have ordered a pelvic ultrasound for.  She is aware of order.  I initially did place referral to general surgery for evaluation and discussion of the hernia but patient declined this saying that it did not seem to be bothering her much lately.  She has appointment with GI on 10/07/2017.  Follow-up as directed.

## 2017-09-06 DIAGNOSIS — D485 Neoplasm of uncertain behavior of skin: Secondary | ICD-10-CM | POA: Diagnosis not present

## 2017-09-06 DIAGNOSIS — C44719 Basal cell carcinoma of skin of left lower limb, including hip: Secondary | ICD-10-CM | POA: Diagnosis not present

## 2017-09-06 DIAGNOSIS — L821 Other seborrheic keratosis: Secondary | ICD-10-CM | POA: Diagnosis not present

## 2017-09-06 DIAGNOSIS — L720 Epidermal cyst: Secondary | ICD-10-CM | POA: Diagnosis not present

## 2017-09-13 ENCOUNTER — Other Ambulatory Visit: Payer: Self-pay

## 2017-09-13 DIAGNOSIS — R935 Abnormal findings on diagnostic imaging of other abdominal regions, including retroperitoneum: Secondary | ICD-10-CM

## 2017-09-14 ENCOUNTER — Ambulatory Visit (HOSPITAL_COMMUNITY): Admission: RE | Admit: 2017-09-14 | Payer: Medicare PPO | Source: Ambulatory Visit

## 2017-09-15 ENCOUNTER — Encounter: Payer: Self-pay | Admitting: Family Medicine

## 2017-09-15 DIAGNOSIS — C4491 Basal cell carcinoma of skin, unspecified: Secondary | ICD-10-CM | POA: Insufficient documentation

## 2017-09-21 ENCOUNTER — Other Ambulatory Visit: Payer: Self-pay | Admitting: Family Medicine

## 2017-09-22 ENCOUNTER — Ambulatory Visit
Admission: RE | Admit: 2017-09-22 | Discharge: 2017-09-22 | Disposition: A | Discharge status: Home / Self Care | Payer: Medicare PPO | Source: Ambulatory Visit | Attending: Family Medicine | Admitting: Family Medicine

## 2017-09-22 DIAGNOSIS — R935 Abnormal findings on diagnostic imaging of other abdominal regions, including retroperitoneum: Secondary | ICD-10-CM

## 2017-09-23 DIAGNOSIS — C44619 Basal cell carcinoma of skin of left upper limb, including shoulder: Secondary | ICD-10-CM | POA: Diagnosis not present

## 2017-09-28 ENCOUNTER — Ambulatory Visit (AMBULATORY_SURGERY_CENTER): Payer: Self-pay

## 2017-09-28 VITALS — Ht 66.0 in | Wt 191.8 lb

## 2017-09-28 DIAGNOSIS — Z1211 Encounter for screening for malignant neoplasm of colon: Secondary | ICD-10-CM

## 2017-09-28 MED ORDER — NA SULFATE-K SULFATE-MG SULF 17.5-3.13-1.6 GM/177ML PO SOLN: 1.0000 | Freq: Once | ORAL | 0 refills | Status: AC

## 2017-09-28 NOTE — Progress Notes (Signed)
Per pt, no allergies to soy or egg products.Pt not taking any weight loss meds or using  O2 at home.  Pt refused emmi video. 

## 2017-10-01 ENCOUNTER — Encounter: Payer: Self-pay | Admitting: Gastroenterology

## 2017-10-07 ENCOUNTER — Encounter: Payer: Medicare PPO | Admitting: Gastroenterology

## 2017-10-14 DIAGNOSIS — L0100 Impetigo, unspecified: Secondary | ICD-10-CM | POA: Diagnosis not present

## 2017-12-01 ENCOUNTER — Ambulatory Visit: Payer: Medicare PPO | Admitting: Family Medicine

## 2017-12-06 ENCOUNTER — Encounter: Payer: Self-pay | Admitting: Family Medicine

## 2017-12-06 ENCOUNTER — Ambulatory Visit: Payer: Medicare PPO | Admitting: Family Medicine

## 2017-12-06 VITALS — BP 131/87 | HR 79 | Temp 98.1°F | Ht 66.0 in | Wt 190.0 lb

## 2017-12-06 DIAGNOSIS — N840 Polyp of corpus uteri: Secondary | ICD-10-CM

## 2017-12-06 DIAGNOSIS — Z1159 Encounter for screening for other viral diseases: Secondary | ICD-10-CM

## 2017-12-06 DIAGNOSIS — E1169 Type 2 diabetes mellitus with other specified complication: Secondary | ICD-10-CM

## 2017-12-06 DIAGNOSIS — C4491 Basal cell carcinoma of skin, unspecified: Secondary | ICD-10-CM

## 2017-12-06 DIAGNOSIS — E785 Hyperlipidemia, unspecified: Secondary | ICD-10-CM | POA: Diagnosis not present

## 2017-12-06 DIAGNOSIS — E119 Type 2 diabetes mellitus without complications: Secondary | ICD-10-CM

## 2017-12-06 DIAGNOSIS — F17290 Nicotine dependence, other tobacco product, uncomplicated: Secondary | ICD-10-CM | POA: Insufficient documentation

## 2017-12-06 DIAGNOSIS — Z716 Tobacco abuse counseling: Secondary | ICD-10-CM

## 2017-12-06 DIAGNOSIS — Z23 Encounter for immunization: Secondary | ICD-10-CM

## 2017-12-06 LAB — BAYER DCA HB A1C WAIVED: HB A1C (BAYER DCA - WAIVED): 8.3 % — ABNORMAL HIGH (ref <7.0)

## 2017-12-06 MED ORDER — METFORMIN HCL ER 500 MG PO TB24: 500.0000 mg | ORAL_TABLET | Freq: Every day | ORAL | 1 refills | Status: DC

## 2017-12-06 MED ORDER — METFORMIN HCL 500 MG PO TABS: 500.0000 mg | ORAL_TABLET | Freq: Two times a day (BID) | ORAL | 3 refills | Status: DC

## 2017-12-06 MED ORDER — ESCITALOPRAM OXALATE 10 MG PO TABS: 10.0000 mg | ORAL_TABLET | Freq: Every day | ORAL | 3 refills | Status: DC

## 2017-12-06 NOTE — Progress Notes (Signed)
Subjective: CC: DM2 PCP: [name], DO Debra Miranda is a 72 y.o. female presenting to clinic today for:  1. Type 2 Diabetes w/ HLD Patient reports compliance w/ Metformin 500 mg p.o. twice daily, Januvia 100mg ,  Lipitor 20mg .  Last eye exam: Sees Dr. Deon Pilling.  She reports she had cataract surgery earlier this year and has seen him since.  She will have him send the copy of her eye exam Last foot exam: 08/2017 Last A1c: 8.6 (08/2017) Nephropathy screen indicated?: Negative 08/2017 Last flu, zoster and/or pneumovax: Flu needed  ROS: Denies fever, chills, dizziness, LOC, polyuria, polydipsia, unintended weight loss/gain, foot ulcerations, numbness or tingling in extremities or chest pain.    2.  Nonhealing skin lesion   Nonhealing skin lesion was found to be nodular basal cell carcinoma by dermatology.  She is now status post excision of this lesion.  She reports that is essentially healed up now and no longer drains.  Denies any tenderness to palpation.  3.  Endometrial polyp Patient noted to have what appears to be an endometrial polyp on her ultrasound.  She denies any abnormal vaginal bleeding or abnormal vaginal discharge..  She does report chronic right-sided lower quadrant pain.  Family history significant for ovarian cancer in her mother.  4.  Smoker Patient reports smoking 2 packs of cigars per day, this is about 8 cigars daily.  She denies any shortness of breath, unplanned weight loss, cough.  No shortness of breath or chest pain.  She reports having stopped smoking for a couple of years previously but gained 40 pounds and therefore she started smoking again.  She does not wish to quit at this time.   ROS: Per HPI  Allergies  Allergen Reactions  . Latex     itching   Past Medical History:  Diagnosis Date  . Abdominal pain    RIGHT LOWER ABD PAIN/ ON AND OFF FOR 2-3 MONTHS  . Allergy   . Cancer (HCC)    LEFT LOWER LEG  . Cataract    RIGHT EYE  .  Depression    many years ago, no meds  . Diabetes mellitus without complication ([place])   . Hemorrhoids   . Orthostatic hypotension     Current Outpatient Medications:  .  atorvastatin (LIPITOR) 20 MG tablet, Take 1 tablet (20 mg total) by mouth daily., Disp: 90 tablet, Rfl: 3 .  Calcium Carbonate-Vit D-Min (CALCIUM 1200 PO), Take by mouth., Disp: , Rfl:  .  Cholecalciferol (VITAMIN D) 2000 UNITS CAPS, Take 2,000 Units by mouth daily., Disp: , Rfl:  .  CINNAMON PO, Take 1 tablet by mouth daily., Disp: , Rfl:  .  doxycycline (DORYX) 100 MG EC tablet, Take 100 mg by mouth daily. Take one pill daily for 30 days, Disp: , Rfl:  .  escitalopram (LEXAPRO) 10 MG tablet, TAKE 1 TABLET BY MOUTH EVERY DAY, Disp: 90 tablet, Rfl: 0 .  metFORMIN (GLUCOPHAGE) 500 MG tablet, Take 1 tablet (500 mg total) by mouth 2 (two) times daily with a meal., Disp: 180 tablet, Rfl: 3 .  Omega-3 Fatty Acids (FISH OIL) 1000 MG CAPS, Take 1 capsule by mouth daily., Disp: , Rfl:  .  sitaGLIPtin (JANUVIA) 100 MG tablet, Take 100 mg by mouth daily., Disp: , Rfl:  Social History   Socioeconomic History  . Marital status: Widowed    Spouse name: Not on file  . Number of children: 1  . Years of education: 44  .  Highest education level: Associate degree: occupational, Hotel manager, or vocational program  Occupational History  . Occupation: Retired    Comment: Data processing manager Needs  . Financial resource strain: Not hard at all  . Food insecurity:    Worry: Never true    Inability: Never true  . Transportation needs:    Medical: No    Non-medical: No  Tobacco Use  . Smoking status: Current Every Day Smoker    Years: 45.00    Types: Cigars    Last attempt to quit: 07/29/2010    Years since quitting: 7.3  . Smokeless tobacco: Never Used  . Tobacco comment: 4 small cigars a day  Substance and Sexual Activity  . Alcohol use: No  . Drug use: No  . Sexual activity: Never  Lifestyle  . Physical activity:    Days  per week: 7 days    Minutes per session: 120 min  . Stress: Only a little  Relationships  . Social connections:    Talks on phone: More than three times a week    Gets together: More than three times a week    Attends religious service: More than 4 times per year    Active member of club or organization: Yes    Attends meetings of clubs or organizations: Never    Relationship status: Married  . Intimate partner violence:    Fear of current or ex partner: No    Emotionally abused: No    Physically abused: No    Forced sexual activity: Not on file  Other Topics Concern  . Not on file  Social History Narrative  . Not on file   Family History  Problem Relation Age of Onset  . Cancer Mother        cervical, metastasis  . Stroke Father   . Pulmonary fibrosis Brother   . Alzheimer's disease Brother   . Diabetes Brother   . Stroke Paternal Grandfather   . Colon cancer Neg Hx     Objective: Office vital signs reviewed. BP 131/87   Pulse 79   Temp 98.1 F (36.7 C) (Oral)   Ht 5\' 6"  (1.676 m)   Wt 190 lb (86.2 kg)   BMI 30.67 kg/m   Physical Examination:  General: Awake, alert, nontoxic, No acute distress HEENT: sclera white, MMM; no oral lesions appreciated during today's exam.   Cardio: regular rate and rhythm, S1S2 heard, no murmurs appreciated Pulm: clear to auscultation bilaterally, no wheezes, rhonchi or rales; normal work of breathing on room air Extremities: warm, No edema, cyanosis or clubbing; +1 pedal pulses bilaterally MSK: normal gait and normal station Skin: Left anterior shin with a nickel sized area of hyperpigmentation and scarring.  Ulcer previously seen has totally resolved.  No active drainage, palpable fluctuance or induration.  Assessment/ Plan: 72 y.o. female   1. Controlled type 2 diabetes mellitus without complication, without long-term current use of insulin (HCC) A1c 8.3.  For age, level is appropriate.  Would like her to be closer to an 8 or  in the high sevens.  She is currently getting Januvia through medication assistance program.  Because she is having problems with diarrhea related to metformin use, we have changed the formulation from metformin immediate release to metformin extended release.  She will follow-up in 3 months for recheck of A1c.  She will have her eye exam results sent to the office. - Bayer DCA Hb A1c Waived  2. Hyperlipidemia associated with type 2  diabetes mellitus ([place]) Continue Lipitor.  3. Nodular basal cell carcinoma (BCC) Status post excision and doing well.  4. Endometrial polyp Having some right lower quadrant pain but otherwise not having any symptoms.  Will refer to OB/GYN for further evaluation. - Ambulatory referral to Obstetrics / Gynecology  5. Smokes cigars Pre-contemplative.  We will continue to offer cessation tools  6. Encounter for smoking cessation counseling Pre-contemplative.  Counseling performed today.  Patient understanding of risks of smoking.  7. Encounter for hepatitis C screening test for low risk patient - Hepatitis C antibody    Orders Placed This Encounter  Procedures  . Bayer DCA Hb A1c Waived  . Hepatitis C antibody   Meds ordered this encounter  Medications  . DISCONTD: metFORMIN (GLUCOPHAGE) 500 MG tablet    Sig: Take 1 tablet (500 mg total) by mouth 2 (two) times daily with a meal.    Dispense:  180 tablet    Refill:  3  . escitalopram (LEXAPRO) 10 MG tablet    Sig: Take 1 tablet (10 mg total) by mouth daily.    Dispense:  90 tablet    Refill:  3  . metFORMIN (GLUCOPHAGE XR) 500 MG 24 hr tablet    Sig: Take 1 tablet (500 mg total) by mouth daily with breakfast.    Dispense:  90 tablet    Refill:  [place], DO [place] [phone]

## 2017-12-06 NOTE — Patient Instructions (Signed)
Your hemoglobin A1c was 8.3% today.  This is a slight decrease from last check.  Continue the Januvia.  I have changed your metformin to the extended release formulation.  You may take 1 tablet daily.  We will recheck your A1c in 3 months.  Have Dr. Deon Pilling send me your eye exam results.  I have placed a referral to the GYN for further evaluation of the abnormal ultrasound and CAT scan.

## 2017-12-07 LAB — HEPATITIS C ANTIBODY: Hep C Virus Ab: <0.1 s/co ratio (ref 0.0–0.9)

## 2017-12-21 ENCOUNTER — Ambulatory Visit: Payer: Medicare PPO | Admitting: Obstetrics & Gynecology

## 2017-12-21 ENCOUNTER — Encounter: Payer: Self-pay | Admitting: Obstetrics & Gynecology

## 2017-12-21 VITALS — BP 135/82 | HR 74 | Ht 66.0 in | Wt 189.0 lb

## 2017-12-21 DIAGNOSIS — N840 Polyp of corpus uteri: Secondary | ICD-10-CM

## 2017-12-21 NOTE — Progress Notes (Signed)
Chief Complaint  Patient presents with  . ENDOMETRIAL POLYP      72 y.o. G1P1001 No LMP recorded. Patient is postmenopausal. The current method of family planning is post menopausal status.  Outpatient Encounter Medications as of 12/21/2017  Medication Sig  . atorvastatin (LIPITOR) 20 MG tablet Take 1 tablet (20 mg total) by mouth daily.  . Calcium Carbonate-Vit D-Min (CALCIUM 1200 PO) Take by mouth.  . Cholecalciferol (VITAMIN D) 2000 UNITS CAPS Take 2,000 Units by mouth daily.  Marland Kitchen CINNAMON PO Take 1 tablet by mouth daily.  Marland Kitchen escitalopram (LEXAPRO) 10 MG tablet Take 1 tablet (10 mg total) by mouth daily.  . metFORMIN (GLUCOPHAGE XR) 500 MG 24 hr tablet Take 1 tablet (500 mg total) by mouth daily with breakfast.  . sitaGLIPtin (JANUVIA) 100 MG tablet Take 100 mg by mouth daily.  . Omega-3 Fatty Acids (FISH OIL) 1000 MG CAPS Take 1 capsule by mouth daily.   No facility-administered encounter medications on file as of 12/21/2017.     Subjective Pt is seen as a follow up referral from Dr Lajuana Ripple Had a CT for RLQ pain which is essentially negative for that source but showed a central mass of the uterus Sonogram reveals an endometrial polyp No bleeding associated since menopause No other complaints, just RLQ pain after doing yard work Past Medical History:  Diagnosis Date  . Abdominal pain    RIGHT LOWER ABD PAIN/ ON AND OFF FOR 2-3 MONTHS  . Allergy   . Cancer (HCC)    LEFT LOWER LEG  . Cataract    RIGHT EYE  . Depression    many years ago, no meds  . Diabetes mellitus without complication ([place])   . Hemorrhoids   . Orthostatic hypotension     Past Surgical History:  Procedure Laterality Date  . CATARACT EXTRACTION     RIGHT EYE  . LUMBAR [place] SURGERY  2006  . TONSILLECTOMY     as a child    OB History    Gravida  1   Para  1   Term  1   Preterm      AB      Living  1     SAB      TAB      Ectopic      Multiple      Live Births             Allergies  Allergen Reactions  . Latex     itching    Social History   Socioeconomic History  . Marital status: Widowed    Spouse name: Not on file  . Number of children: 1  . Years of education: 80  . Highest education level: Associate degree: occupational, Hotel manager, or vocational program  Occupational History  . Occupation: Retired    Comment: Data processing manager Needs  . Financial resource strain: Not hard at all  . Food insecurity:    Worry: Never true    Inability: Never true  . Transportation needs:    Medical: No    Non-medical: No  Tobacco Use  . Smoking status: Current Every Day Smoker    Years: 45.00    Types: Cigars    Last attempt to quit: 07/29/2010    Years since quitting: 7.4  . Smokeless tobacco: Never Used  . Tobacco comment: 4 small cigars a day  Substance and Sexual Activity  . Alcohol use: No  . Drug  use: No  . Sexual activity: Never  Lifestyle  . Physical activity:    Days per week: 7 days    Minutes per session: 120 min  . Stress: Only a little  Relationships  . Social connections:    Talks on phone: More than three times a week    Gets together: More than three times a week    Attends religious service: More than 4 times per year    Active member of club or organization: Yes    Attends meetings of clubs or organizations: Never    Relationship status: Married  Other Topics Concern  . Not on file  Social History Narrative  . Not on file    Family History  Problem Relation Age of Onset  . Cancer Mother        cervical, metastasis  . Stroke Father   . Pulmonary fibrosis Brother   . Alzheimer's disease Brother   . Diabetes Brother   . Stroke Paternal Grandfather   . Colon cancer Neg Hx     Medications:       Current Outpatient Medications:  .  atorvastatin (LIPITOR) 20 MG tablet, Take 1 tablet (20 mg total) by mouth daily., Disp: 90 tablet, Rfl: 3 .  Calcium Carbonate-Vit D-Min (CALCIUM 1200 PO), Take by mouth.,  Disp: , Rfl:  .  Cholecalciferol (VITAMIN D) 2000 UNITS CAPS, Take 2,000 Units by mouth daily., Disp: , Rfl:  .  CINNAMON PO, Take 1 tablet by mouth daily., Disp: , Rfl:  .  escitalopram (LEXAPRO) 10 MG tablet, Take 1 tablet (10 mg total) by mouth daily., Disp: 90 tablet, Rfl: 3 .  metFORMIN (GLUCOPHAGE XR) 500 MG 24 hr tablet, Take 1 tablet (500 mg total) by mouth daily with breakfast., Disp: 90 tablet, Rfl: 1 .  sitaGLIPtin (JANUVIA) 100 MG tablet, Take 100 mg by mouth daily., Disp: , Rfl:  .  Omega-3 Fatty Acids (FISH OIL) 1000 MG CAPS, Take 1 capsule by mouth daily., Disp: , Rfl:   Objective Blood pressure 135/82, pulse 74, height 5\' 6"  (1.676 m), weight 189 lb (85.7 kg).  Gen WDWN NAD  Pertinent ROS No burning with urination, frequency or urgency No nausea, vomiting or diarrhea Nor fever chills or other constitutional symptoms   Labs or studies Reviewed scans    Impression Diagnoses this Encounter::   ICD-10-CM   1. Endometrial polyp N84.0     Established relevant diagnosis(es):   Plan/Recommendations: No orders of the defined types were placed in this encounter.   Labs or Scans Ordered: No orders of the defined types were placed in this encounter.   Management:: >scheduled for Hysteroscopy uterine curettage removal of endometrial polyp 01/05/2018 at [facility], pt informed likely benign but needs formal evaluation and removal not just OP EMB due to her age  Copy is sent to Dr Lajuana Ripple  Follow up Return in about 27 days (around 01/17/2018) for [place], with Dr Elonda Husky.        Face to face time:  20 minutes  Greater than 50% of the visit time was spent in counseling and coordination of care with the patient.  The summary and outline of the counseling and care coordination is summarized in the note above.   All questions were answered.

## 2017-12-28 NOTE — Patient Instructions (Signed)
Debra Miranda  12/28/2017     @PREFPERIOPPHARMACY @   Your procedure is scheduled on  01/05/2018 .  Report to [facility] at  615  A.M.  Call this number if you have problems the morning of surgery:  [phone]   Remember:  Do not eat or drink after midnight.                        Take these medicines the morning of surgery with A SIP OF WATER  None    Do not wear jewelry, make-up or nail polish.  Do not wear lotions, powders, or perfumes, or deodorant.  Do not shave 48 hours prior to surgery.  Men may shave face and neck.  Do not bring valuables to the hospital.  [facility] is not responsible for any belongings or valuables.  Contacts, dentures or bridgework may not be worn into surgery.  Leave your suitcase in the car.  After surgery it may be brought to your room.  For patients admitted to the hospital, discharge time will be determined by your treatment team.  Patients discharged the day of surgery will not be allowed to drive home.   Name and phone number of your driver:   family Special instructions:  DO NOT take any medications for diabetes the morning of your procedure.  Please read over the following fact sheets that you were given. Anesthesia Post-op Instructions and Care and Recovery After Surgery       Dilation and Curettage or Vacuum Curettage Dilation and curettage (D&C) and vacuum curettage are minor procedures. A D&C involves stretching (dilation) the cervix and scraping (curettage) the inside lining of the uterus (endometrium). During a D&C, tissue is gently scraped from the endometrium, starting from the top portion of the uterus down to the lowest part of the uterus (cervix). During a vacuum curettage, the lining and tissue in the uterus are removed with the use of gentle suction. Curettage may be performed to either diagnose or treat a problem. As a diagnostic procedure, curettage is performed to examine tissues from the  uterus. A diagnostic curettage may be done if you have:  Irregular bleeding in the uterus.  Bleeding with the development of clots.  Spotting between menstrual periods.  Prolonged menstrual periods or other abnormal bleeding.  Bleeding after menopause.  No menstrual period (amenorrhea).  A change in size and shape of the uterus.  Abnormal endometrial cells discovered during a Pap test.  As a treatment procedure, curettage may be performed for the following reasons:  Removal of an IUD (intrauterine device).  Removal of retained placenta after giving birth.  Abortion.  Miscarriage.  Removal of endometrial polyps.  Removal of uncommon types of noncancerous lumps (fibroids).  Tell a health care provider about:  Any allergies you have, including allergies to prescribed medicine or latex.  All medicines you are taking, including vitamins, herbs, eye drops, creams, and over-the-counter medicines. This is especially important if you take any blood-thinning medicine. Bring a list of all of your medicines to your appointment.  Any problems you or family members have had with anesthetic medicines.  Any blood disorders you have.  Any surgeries you have had.  Your medical history and any medical conditions you have.  Whether you are pregnant or may be pregnant.  Recent vaginal infections you have had.  Recent menstrual periods, bleeding problems you have  had, and what form of birth control (contraception) you use. What are the risks? Generally, this is a safe procedure. However, problems may occur, including:  Infection.  Heavy vaginal bleeding.  Allergic reactions to medicines.  Damage to the cervix or other structures or organs.  Development of scar tissue (adhesions) inside the uterus, which can cause abnormal amounts of menstrual bleeding. This may make it harder to get pregnant in the future.  A hole (perforation) or puncture in the uterine wall. This is  rare.  What happens before the procedure? Staying hydrated Follow instructions from your health care provider about hydration, which may include:  Up to 2 hours before the procedure - you may continue to drink clear liquids, such as water, clear fruit juice, black coffee, and plain tea.  Eating and drinking restrictions Follow instructions from your health care provider about eating and drinking, which may include:  8 hours before the procedure - stop eating heavy meals or foods such as meat, fried foods, or fatty foods.  6 hours before the procedure - stop eating light meals or foods, such as toast or cereal.  6 hours before the procedure - stop drinking milk or drinks that contain milk.  2 hours before the procedure - stop drinking clear liquids. If your health care provider told you to take your medicine(s) on the day of your procedure, take them with only a sip of water.  Medicines  Ask your health care provider about: ? Changing or stopping your regular medicines. This is especially important if you are taking diabetes medicines or blood thinners. ? Taking medicines such as aspirin and ibuprofen. These medicines can thin your blood. Do not take these medicines before your procedure if your health care provider instructs you not to.  You may be given antibiotic medicine to help prevent infection. General instructions  For 24 hours before your procedure, do not: ? Douche. ? Use tampons. ? Use medicines, creams, or suppositories in the vagina. ? Have sexual intercourse.  You may be given a pregnancy test on the day of the procedure.  Plan to have someone take you home from the hospital or clinic.  You may have a blood or urine sample taken.  If you will be going home right after the procedure, plan to have someone with you for 24 hours. What happens during the procedure?  To reduce your risk of infection: ? Your health care team will wash or sanitize their hands. ? Your  skin will be washed with soap.  An IV tube will be inserted into one of your veins.  You will be given one of the following: ? A medicine that numbs the area in and around the cervix (local anesthetic). ? A medicine to make you fall asleep (general anesthetic).  You will lie down on your back, with your feet in foot rests (stirrups).  The size and position of your uterus will be checked.  A lubricated instrument (speculum or Sims retractor) will be inserted into the back side of your vagina. The speculum will be used to hold apart the walls of your vagina so your health care provider can see your cervix.  A tool (tenaculum) will be attached to the lip of the cervix to stabilize it.  Your cervix will be softened and dilated. This may be done by: ? Taking a medicine. ? Having tapered dilators or thin rods (laminaria) or gradual widening instruments (tapered dilators) inserted into your cervix.  A small, sharp, curved  instrument (curette) will be used to scrape a small amount of tissue or cells from the endometrium or cervical canal. In some cases, gentle suction is applied with the curette. The curette will then be removed. The cells will be taken to a lab for testing. The procedure may vary among health care providers and hospitals. What happens after the procedure?  You may have mild cramping, backache, pain, and light bleeding or spotting. You may pass small blood clots from your vagina.  You may have to wear compression stockings. These stockings help to prevent blood clots and reduce swelling in your legs.  Your blood pressure, heart rate, breathing rate, and blood oxygen level will be monitored until the medicines you were given have worn off. Summary  Dilation and curettage (D&C) involves stretching (dilation) the cervix and scraping (curettage) the inside lining of the uterus (endometrium).  After the procedure, you may have mild cramping, backache, pain, and light bleeding or  spotting. You may pass small blood clots from your vagina.  Plan to have someone take you home from the hospital or clinic. This information is not intended to replace advice given to you by your health care provider. Make sure you discuss any questions you have with your health care provider. Document Released: 02/02/2005 Document Revised: 10/20/2015 Document Reviewed: 10/20/2015 Elsevier Interactive Patient Education  2018 [employer].  Dilation and Curettage or Vacuum Curettage, Care After These instructions give you information about caring for yourself after your procedure. Your doctor may also give you more specific instructions. Call your doctor if you have any problems or questions after your procedure. Follow these instructions at home: Activity  Do not drive or use heavy machinery while taking prescription pain medicine.  For 24 hours after your procedure, avoid driving.  Take short walks often, followed by rest periods. Ask your doctor what activities are safe for you. After one or two days, you may be able to return to your normal activities.  Do not lift anything that is heavier than 10 lb (4.5 kg) until your doctor approves.  For at least 2 weeks, or as long as told by your doctor: ? Do not douche. ? Do not use tampons. ? Do not have sex. General instructions  Take over-the-counter and prescription medicines only as told by your doctor. This is very important if you take blood thinning medicine.  Do not take baths, swim, or use a hot tub until your doctor approves. Take showers instead of baths.  Wear compression stockings as told by your doctor.  It is up to you to get the results of your procedure. Ask your doctor when your results will be ready.  Keep all follow-up visits as told by your doctor. This is important. Contact a doctor if:  You have very bad cramps that get worse or do not get better with medicine.  You have very bad pain in your belly  (abdomen).  You cannot drink fluids without throwing up (vomiting).  You get pain in a different part of the area between your belly and thighs (pelvis).  You have bad-smelling discharge from your vagina.  You have a rash. Get help right away if:  You are bleeding a lot from your vagina. A lot of bleeding means soaking more than one sanitary pad in an hour, for 2 hours in a row.  You have clumps of blood (blood clots) coming from your vagina.  You have a fever or chills.  Your belly feels very tender  or hard.  You have chest pain.  You have trouble breathing.  You cough up blood.  You feel dizzy.  You feel light-headed.  You pass out (faint).  You have pain in your neck or shoulder area. Summary  Take short walks often, followed by rest periods. Ask your doctor what activities are safe for you. After one or two days, you may be able to return to your normal activities.  Do not lift anything that is heavier than 10 lb (4.5 kg) until your doctor approves.  Do not take baths, swim, or use a hot tub until your doctor approves. Take showers instead of baths.  Contact your doctor if you have any symptoms of infection, like bad-smelling discharge from your vagina. This information is not intended to replace advice given to you by your health care provider. Make sure you discuss any questions you have with your health care provider. Document Released: 11/12/2007 Document Revised: 10/21/2015 Document Reviewed: 10/21/2015 Elsevier Interactive Patient Education  2017 [place]. Hysteroscopy Hysteroscopy is a procedure used for looking inside the womb (uterus). It may be done for various reasons, including:  To evaluate abnormal bleeding, fibroid (benign, noncancerous) tumors, polyps, scar tissue (adhesions), and possibly cancer of the uterus.  To look for lumps (tumors) and other uterine growths.  To look for causes of why a woman cannot get pregnant (infertility), causes  of recurrent loss of pregnancy (miscarriages), or a lost intrauterine device (IUD).  To perform a sterilization by blocking the fallopian tubes from inside the uterus.  In this procedure, a thin, flexible tube with a tiny light and camera on the end of it (hysteroscope) is used to look inside the uterus. A hysteroscopy should be done right after a menstrual period to be sure you are not pregnant. LET [facility] CARE PROVIDER KNOW ABOUT:  Any allergies you have.  All medicines you are taking, including vitamins, herbs, eye drops, creams, and over-the-counter medicines.  Previous problems you or members of your family have had with the use of anesthetics.  Any blood disorders you have.  Previous surgeries you have had.  Medical conditions you have. RISKS AND COMPLICATIONS Generally, this is a safe procedure. However, as with any procedure, complications can occur. Possible complications include:  Putting a hole in the uterus.  Excessive bleeding.  Infection.  Damage to the cervix.  Injury to other organs.  Allergic reaction to medicines.  Too much fluid used in the uterus for the procedure.  BEFORE THE PROCEDURE  Ask your health care provider about changing or stopping any regular medicines.  Do not take aspirin or blood thinners for 1 week before the procedure, or as directed by your health care provider. These can cause bleeding.  If you smoke, do not smoke for 2 weeks before the procedure.  In some cases, a medicine is placed in the cervix the day before the procedure. This medicine makes the cervix have a larger opening (dilate). This makes it easier for the instrument to be inserted into the uterus during the procedure.  Do not eat or drink anything for at least 8 hours before the surgery.  Arrange for someone to take you home after the procedure. PROCEDURE  You may be given a medicine to relax you (sedative). You may also be given one of the following: ? A  medicine that numbs the area around the cervix (local anesthetic). ? A medicine that makes you sleep through the procedure (general anesthetic).  The hysteroscope is  inserted through the vagina into the uterus. The camera on the hysteroscope sends a picture to a TV screen. This gives the surgeon a good view inside the uterus.  During the procedure, air or a liquid is put into the uterus, which allows the surgeon to see better.  Sometimes, tissue is gently scraped from inside the uterus. These tissue samples are sent to a lab for testing. What to expect after the procedure  If you had a general anesthetic, you may be groggy for a couple hours after the procedure.  If you had a local anesthetic, you will be able to go home as soon as you are stable and feel ready.  You may have some cramping. This normally lasts for a couple days.  You may have bleeding, which varies from light spotting for a few days to menstrual-like bleeding for 3-7 days. This is normal.  If your test results are not back during the visit, make an appointment with your health care provider to find out the results. This information is not intended to replace advice given to you by your health care provider. Make sure you discuss any questions you have with your health care provider. Document Released: 05/11/2000 Document Revised: 07/11/2015 Document Reviewed: 09/01/2012 Elsevier Interactive Patient Education  2017 [place]. Hysteroscopy, Care After Refer to this sheet in the next few weeks. These instructions provide you with information on caring for yourself after your procedure. Your health care provider may also give you more specific instructions. Your treatment has been planned according to current medical practices, but problems sometimes occur. Call your health care provider if you have any problems or questions after your procedure. What can I expect after the procedure? After your procedure, it is typical to  have the following:  You may have some cramping. This normally lasts for a couple days.  You may have bleeding. This can vary from light spotting for a few days to menstrual-like bleeding for 3-7 days.  Follow these instructions at home:  Rest for the first 1-2 days after the procedure.  Only take over-the-counter or prescription medicines as directed by your health care provider. Do not take aspirin. It can increase the chances of bleeding.  Take showers instead of baths for 2 weeks or as directed by your health care provider.  Do not drive for 24 hours or as directed.  Do not drink alcohol while taking pain medicine.  Do not use tampons, douche, or have sexual intercourse for 2 weeks or until your health care provider says it is okay.  Take your temperature twice a day for 4-5 days. Write it down each time.  Follow your health care provider's advice about diet, exercise, and lifting.  If you develop constipation, you may: ? Take a mild laxative if your health care provider approves. ? Add bran foods to your diet. ? Drink enough fluids to keep your urine clear or pale yellow.  Try to have someone with you or available to you for the first 24-48 hours, especially if you were given a general anesthetic.  Follow up with your health care provider as directed. Contact a health care provider if:  You feel dizzy or lightheaded.  You feel sick to your stomach (nauseous).  You have abnormal vaginal discharge.  You have a rash.  You have pain that is not controlled with medicine. Get help right away if:  You have bleeding that is heavier than a normal menstrual period.  You have a fever.  You have increasing cramps or pain, not controlled with medicine.  You have new belly (abdominal) pain.  You pass out.  You have pain in the tops of your shoulders (shoulder strap areas).  You have shortness of breath. This information is not intended to replace advice given to you  by your health care provider. Make sure you discuss any questions you have with your health care provider. Document Released: 11/23/2012 Document Revised: 07/11/2015 Document Reviewed: 09/01/2012 Elsevier Interactive Patient Education  2017 [place] Anesthesia, Adult General anesthesia is the use of medicines to make a person "go to sleep" (be unconscious) for a medical procedure. General anesthesia is often recommended when a procedure:  Is long.  Requires you to be still or in an unusual position.  Is major and can cause you to lose blood.  Is impossible to do without general anesthesia.  The medicines used for general anesthesia are called general anesthetics. In addition to making you sleep, the medicines:  Prevent pain.  Control your blood pressure.  Relax your muscles.  Tell a health care provider about:  Any allergies you have.  All medicines you are taking, including vitamins, herbs, eye drops, creams, and over-the-counter medicines.  Any problems you or family members have had with anesthetic medicines.  Types of anesthetics you have had in the past.  Any bleeding disorders you have.  Any surgeries you have had.  Any medical conditions you have.  Any history of heart or lung conditions, such as heart failure, sleep apnea, or chronic obstructive pulmonary disease (COPD).  Whether you are pregnant or may be pregnant.  Whether you use tobacco, alcohol, marijuana, or street drugs.  Any history of Armed forces logistics/support/administrative officer.  Any history of depression or anxiety. What are the risks? Generally, this is a safe procedure. However, problems may occur, including:  Allergic reaction to anesthetics.  Lung and heart problems.  Inhaling food or liquids from your stomach into your lungs (aspiration).  Injury to nerves.  Waking up during your procedure and being unable to move (rare).  Extreme agitation or a state of mental confusion (delirium) when you wake  up from the anesthetic.  Air in the bloodstream, which can lead to stroke.  These problems are more likely to develop if you are having a major surgery or if you have an advanced medical condition. You can prevent some of these complications by answering all of your health care provider's questions thoroughly and by following all pre-procedure instructions. General anesthesia can cause side effects, including:  Nausea or vomiting  A sore throat from the breathing tube.  Feeling cold or shivery.  Feeling tired, washed out, or achy.  Sleepiness or drowsiness.  Confusion or agitation.  What happens before the procedure? Staying hydrated Follow instructions from your health care provider about hydration, which may include:  Up to 2 hours before the procedure - you may continue to drink clear liquids, such as water, clear fruit juice, black coffee, and plain tea.  Eating and drinking restrictions Follow instructions from your health care provider about eating and drinking, which may include:  8 hours before the procedure - stop eating heavy meals or foods such as meat, fried foods, or fatty foods.  6 hours before the procedure - stop eating light meals or foods, such as toast or cereal.  6 hours before the procedure - stop drinking milk or drinks that contain milk.  2 hours before the procedure - stop drinking clear liquids.  Medicines  Ask your health care provider about: ? Changing or stopping your regular medicines. This is especially important if you are taking diabetes medicines or blood thinners. ? Taking medicines such as aspirin and ibuprofen. These medicines can thin your blood. Do not take these medicines before your procedure if your health care provider instructs you not to. ? Taking new dietary supplements or medicines. Do not take these during the week before your procedure unless your health care provider approves them.  If you are told to take a medicine or to  continue taking a medicine on the day of the procedure, take the medicine with sips of water. General instructions   Ask if you will be going home the same day, the following day, or after a longer hospital stay. ? Plan to have someone take you home. ? Plan to have someone stay with you for the first 24 hours after you leave the hospital or clinic.  For 3-6 weeks before the procedure, try not to use any tobacco products, such as cigarettes, chewing tobacco, and e-cigarettes.  You may brush your teeth on the morning of the procedure, but make sure to spit out the toothpaste. What happens during the procedure?  You will be given anesthetics through a mask and through an IV tube in one of your veins.  You may receive medicine to help you relax (sedative).  As soon as you are asleep, a breathing tube may be used to help you breathe.  An anesthesia specialist will stay with you throughout the procedure. He or she will help keep you comfortable and safe by continuing to give you medicines and adjusting the amount of medicine that you get. He or she will also watch your blood pressure, pulse, and oxygen levels to make sure that the anesthetics do not cause any problems.  If a breathing tube was used to help you breathe, it will be removed before you wake up. The procedure may vary among health care providers and hospitals. What happens after the procedure?  You will wake up, often slowly, after the procedure is complete, usually in a recovery area.  Your blood pressure, heart rate, breathing rate, and blood oxygen level will be monitored until the medicines you were given have worn off.  You may be given medicine to help you calm down if you feel anxious or agitated.  If you will be going home the same day, your health care provider may check to make sure you can stand, drink, and urinate.  Your health care providers will treat your pain and side effects before you go home.  Do not drive  for 24 hours if you received a sedative.  You may: ? Feel nauseous and vomit. ? Have a sore throat. ? Have mental slowness. ? Feel cold or shivery. ? Feel sleepy. ? Feel tired. ? Feel sore or achy, even in parts of your body where you did not have surgery. This information is not intended to replace advice given to you by your health care provider. Make sure you discuss any questions you have with your health care provider. Document Released: 05/12/2007 Document Revised: 07/16/2015 Document Reviewed: 01/17/2015 Elsevier Interactive Patient Education  2018 [place] Anesthesia, Adult, Care After These instructions provide you with information about caring for yourself after your procedure. Your health care provider may also give you more specific instructions. Your treatment has been planned according to current medical practices, but problems sometimes occur. Call your health care provider if you  have any problems or questions after your procedure. What can I expect after the procedure? After the procedure, it is common to have:  Vomiting.  A sore throat.  Mental slowness.  It is common to feel:  Nauseous.  Cold or shivery.  Sleepy.  Tired.  Sore or achy, even in parts of your body where you did not have surgery.  Follow these instructions at home: For at least 24 hours after the procedure:  Do not: ? Participate in activities where you could fall or become injured. ? Drive. ? Use heavy machinery. ? Drink alcohol. ? Take sleeping pills or medicines that cause drowsiness. ? Make important decisions or sign legal documents. ? Take care of children on your own.  Rest. Eating and drinking  If you vomit, drink water, juice, or soup when you can drink without vomiting.  Drink enough fluid to keep your urine clear or pale yellow.  Make sure you have little or no nausea before eating solid foods.  Follow the diet recommended by your health care  provider. General instructions  Have a responsible adult stay with you until you are awake and alert.  Return to your normal activities as told by your health care provider. Ask your health care provider what activities are safe for you.  Take over-the-counter and prescription medicines only as told by your health care provider.  If you smoke, do not smoke without supervision.  Keep all follow-up visits as told by your health care provider. This is important. Contact a health care provider if:  You continue to have nausea or vomiting at home, and medicines are not helpful.  You cannot drink fluids or start eating again.  You cannot urinate after 8-12 hours.  You develop a skin rash.  You have fever.  You have increasing redness at the site of your procedure. Get help right away if:  You have difficulty breathing.  You have chest pain.  You have unexpected bleeding.  You feel that you are having a life-threatening or urgent problem. This information is not intended to replace advice given to you by your health care provider. Make sure you discuss any questions you have with your health care provider. Document Released: 05/11/2000 Document Revised: 07/08/2015 Document Reviewed: 01/17/2015 Elsevier Interactive Patient Education  [employer].

## 2017-12-31 ENCOUNTER — Other Ambulatory Visit: Payer: Self-pay | Admitting: Obstetrics & Gynecology

## 2018-01-03 ENCOUNTER — Encounter (HOSPITAL_COMMUNITY): Payer: Self-pay

## 2018-01-03 ENCOUNTER — Other Ambulatory Visit: Payer: Self-pay

## 2018-01-03 ENCOUNTER — Encounter (HOSPITAL_COMMUNITY)
Admission: RE | Admit: 2018-01-03 | Discharge: 2018-01-03 | Disposition: A | Discharge status: Home / Self Care | Payer: Medicare PPO | Source: Ambulatory Visit | Attending: Obstetrics & Gynecology | Admitting: Obstetrics & Gynecology

## 2018-01-03 DIAGNOSIS — E785 Hyperlipidemia, unspecified: Secondary | ICD-10-CM | POA: Diagnosis not present

## 2018-01-03 DIAGNOSIS — F329 Major depressive disorder, single episode, unspecified: Secondary | ICD-10-CM | POA: Diagnosis not present

## 2018-01-03 DIAGNOSIS — Z01818 Encounter for other preprocedural examination: Secondary | ICD-10-CM | POA: Insufficient documentation

## 2018-01-03 DIAGNOSIS — Z79899 Other long term (current) drug therapy: Secondary | ICD-10-CM | POA: Diagnosis not present

## 2018-01-03 DIAGNOSIS — Z7984 Long term (current) use of oral hypoglycemic drugs: Secondary | ICD-10-CM | POA: Diagnosis not present

## 2018-01-03 DIAGNOSIS — C541 Malignant neoplasm of endometrium: Secondary | ICD-10-CM | POA: Diagnosis not present

## 2018-01-03 DIAGNOSIS — F17290 Nicotine dependence, other tobacco product, uncomplicated: Secondary | ICD-10-CM | POA: Diagnosis not present

## 2018-01-03 DIAGNOSIS — E119 Type 2 diabetes mellitus without complications: Secondary | ICD-10-CM | POA: Diagnosis not present

## 2018-01-03 DIAGNOSIS — N840 Polyp of corpus uteri: Secondary | ICD-10-CM | POA: Diagnosis not present

## 2018-01-03 DIAGNOSIS — K589 Irritable bowel syndrome without diarrhea: Secondary | ICD-10-CM | POA: Diagnosis not present

## 2018-01-03 DIAGNOSIS — M8580 Other specified disorders of bone density and structure, unspecified site: Secondary | ICD-10-CM | POA: Diagnosis not present

## 2018-01-03 LAB — CBC
HEMATOCRIT: 49.7 % — AB (ref 36.0–46.0)
Hemoglobin: 15.9 g/dL — ABNORMAL HIGH (ref 12.0–15.0)
MCH: 28.4 pg (ref 26.0–34.0)
MCHC: 32 g/dL (ref 30.0–36.0)
MCV: 88.9 fL (ref 80.0–100.0)
Platelets: 137 10*3/uL — ABNORMAL LOW (ref 150–400)
RBC: 5.59 MIL/uL — ABNORMAL HIGH (ref 3.87–5.11)
RDW: 13.3 % (ref 11.5–15.5)
WBC: 7.5 10*3/uL (ref 4.0–10.5)
nRBC: 0 % (ref 0.0–0.2)

## 2018-01-03 LAB — COMPREHENSIVE METABOLIC PANEL
ALT: 14 U/L (ref 0–44)
ANION GAP: 9 (ref 5–15)
AST: 18 U/L (ref 15–41)
Albumin: 4.1 g/dL (ref 3.5–5.0)
Alkaline Phosphatase: 82 U/L (ref 38–126)
BILIRUBIN TOTAL: 0.7 mg/dL (ref 0.3–1.2)
BUN: 11 mg/dL (ref 8–23)
CO2: 23 mmol/L (ref 22–32)
Calcium: 9.7 mg/dL (ref 8.9–10.3)
Chloride: 105 mmol/L (ref 98–111)
Creatinine, Ser: 0.67 mg/dL (ref 0.44–1.00)
GFR calc Af Amer: >60 mL/min (ref >60)
Glucose, Bld: 199 mg/dL — ABNORMAL HIGH (ref 70–99)
POTASSIUM: 4.2 mmol/L (ref 3.5–5.1)
Sodium: 137 mmol/L (ref 135–145)
TOTAL PROTEIN: 7.7 g/dL (ref 6.5–8.1)

## 2018-01-03 LAB — HEMOGLOBIN A1C
Hgb A1c MFr Bld: 8.1 % — ABNORMAL HIGH (ref 4.8–5.6)
Mean Plasma Glucose: 185.77 mg/dL

## 2018-01-03 LAB — GLUCOSE, CAPILLARY: GLUCOSE-CAPILLARY: 197 mg/dL — AB (ref 70–99)

## 2018-01-04 NOTE — Pre-Procedure Instructions (Signed)
HgbA1C routed to PCP. 

## 2018-01-05 ENCOUNTER — Ambulatory Visit (HOSPITAL_COMMUNITY): Payer: Medicare PPO | Admitting: Anesthesiology

## 2018-01-05 ENCOUNTER — Ambulatory Visit (HOSPITAL_COMMUNITY)
Admission: RE | Admit: 2018-01-05 | Discharge: 2018-01-05 | Disposition: A | Discharge status: Home / Self Care | Payer: Medicare PPO | Source: Ambulatory Visit | Attending: Obstetrics & Gynecology | Admitting: Obstetrics & Gynecology

## 2018-01-05 ENCOUNTER — Encounter (HOSPITAL_COMMUNITY)
Admission: RE | Disposition: A | Discharge status: Home / Self Care | Payer: Self-pay | Source: Ambulatory Visit | Attending: Obstetrics & Gynecology

## 2018-01-05 DIAGNOSIS — Z7984 Long term (current) use of oral hypoglycemic drugs: Secondary | ICD-10-CM | POA: Diagnosis not present

## 2018-01-05 DIAGNOSIS — F329 Major depressive disorder, single episode, unspecified: Secondary | ICD-10-CM | POA: Diagnosis not present

## 2018-01-05 DIAGNOSIS — C541 Malignant neoplasm of endometrium: Secondary | ICD-10-CM | POA: Insufficient documentation

## 2018-01-05 DIAGNOSIS — Z79899 Other long term (current) drug therapy: Secondary | ICD-10-CM | POA: Insufficient documentation

## 2018-01-05 DIAGNOSIS — N840 Polyp of corpus uteri: Secondary | ICD-10-CM | POA: Insufficient documentation

## 2018-01-05 DIAGNOSIS — K589 Irritable bowel syndrome without diarrhea: Secondary | ICD-10-CM | POA: Diagnosis not present

## 2018-01-05 DIAGNOSIS — E785 Hyperlipidemia, unspecified: Secondary | ICD-10-CM | POA: Insufficient documentation

## 2018-01-05 DIAGNOSIS — M8580 Other specified disorders of bone density and structure, unspecified site: Secondary | ICD-10-CM | POA: Insufficient documentation

## 2018-01-05 DIAGNOSIS — F17290 Nicotine dependence, other tobacco product, uncomplicated: Secondary | ICD-10-CM | POA: Insufficient documentation

## 2018-01-05 DIAGNOSIS — E119 Type 2 diabetes mellitus without complications: Secondary | ICD-10-CM | POA: Diagnosis not present

## 2018-01-05 HISTORY — DX: Malignant neoplasm of endometrium: C54.1

## 2018-01-05 HISTORY — PX: POLYPECTOMY: SHX5525

## 2018-01-05 HISTORY — PX: HYSTEROSCOPY WITH D & C: SHX1775

## 2018-01-05 LAB — GLUCOSE, CAPILLARY
Glucose-Capillary: 165 mg/dL — ABNORMAL HIGH (ref 70–99)
Glucose-Capillary: 190 mg/dL — ABNORMAL HIGH (ref 70–99)

## 2018-01-05 SURGERY — DILATATION AND CURETTAGE /HYSTEROSCOPY
Anesthesia: General | Site: Vagina

## 2018-01-05 MED ORDER — CEFAZOLIN SODIUM-DEXTROSE 2-4 GM/100ML-% IV SOLN
2.0000 g | INTRAVENOUS | Status: AC
  Administered 2018-01-05: 2 g via INTRAVENOUS
  Filled 2018-01-05: qty 100

## 2018-01-05 MED ORDER — SUCCINYLCHOLINE CHLORIDE 200 MG/10ML IV SOSY
PREFILLED_SYRINGE | INTRAVENOUS | Status: AC
  Filled 2018-01-05: qty 10

## 2018-01-05 MED ORDER — KETOROLAC TROMETHAMINE 10 MG PO TABS: 10.0000 mg | ORAL_TABLET | Freq: Three times a day (TID) | ORAL | 0 refills | Status: DC | PRN

## 2018-01-05 MED ORDER — SEVOFLURANE IN SOLN
RESPIRATORY_TRACT | Status: AC
  Filled 2018-01-05: qty 250

## 2018-01-05 MED ORDER — KETOROLAC TROMETHAMINE 30 MG/ML IJ SOLN
30.0000 mg | Freq: Once | INTRAMUSCULAR | Status: AC
  Administered 2018-01-05: 30 mg via INTRAVENOUS
  Filled 2018-01-05: qty 1

## 2018-01-05 MED ORDER — SODIUM CHLORIDE 0.9 % IR SOLN
Status: DC | PRN
  Administered 2018-01-05: 3000 mL

## 2018-01-05 MED ORDER — FENTANYL CITRATE (PF) 100 MCG/2ML IJ SOLN
INTRAMUSCULAR | Status: AC
  Filled 2018-01-05: qty 2

## 2018-01-05 MED ORDER — ONDANSETRON 8 MG PO TBDP: 8.0000 mg | ORAL_TABLET | Freq: Three times a day (TID) | ORAL | 0 refills | Status: DC | PRN

## 2018-01-05 MED ORDER — 0.9 % SODIUM CHLORIDE (POUR BTL) OPTIME
TOPICAL | Status: DC | PRN
  Administered 2018-01-05: 1000 mL

## 2018-01-05 MED ORDER — ONDANSETRON HCL 4 MG/2ML IJ SOLN
INTRAMUSCULAR | Status: AC
  Filled 2018-01-05: qty 2

## 2018-01-05 MED ORDER — LIDOCAINE 2% (20 MG/ML) 5 ML SYRINGE
INTRAMUSCULAR | Status: AC
  Filled 2018-01-05: qty 10

## 2018-01-05 MED ORDER — LACTATED RINGERS IV SOLN
INTRAVENOUS | Status: DC
  Administered 2018-01-05: 07:00:00 via INTRAVENOUS

## 2018-01-05 MED ORDER — FENTANYL CITRATE (PF) 100 MCG/2ML IJ SOLN
INTRAMUSCULAR | Status: DC | PRN
  Administered 2018-01-05: 50 ug via INTRAVENOUS
  Administered 2018-01-05 (×2): 25 ug via INTRAVENOUS

## 2018-01-05 MED ORDER — PROPOFOL 10 MG/ML IV BOLUS
INTRAVENOUS | Status: DC | PRN
  Administered 2018-01-05: 150 mg via INTRAVENOUS
  Administered 2018-01-05: 10 mg via INTRAVENOUS
  Administered 2018-01-05: 50 mg via INTRAVENOUS
  Administered 2018-01-05: 30 mg via INTRAVENOUS

## 2018-01-05 MED ORDER — HYDROMORPHONE HCL 1 MG/ML IJ SOLN: 0.2500 mg | INTRAMUSCULAR | Status: DC | PRN

## 2018-01-05 MED ORDER — PROPOFOL 10 MG/ML IV BOLUS
INTRAVENOUS | Status: AC
  Filled 2018-01-05: qty 40

## 2018-01-05 MED ORDER — HYDROCODONE-ACETAMINOPHEN 7.5-325 MG PO TABS: 1.0000 | ORAL_TABLET | Freq: Once | ORAL | Status: DC | PRN

## 2018-01-05 MED ORDER — MIDAZOLAM HCL 2 MG/2ML IJ SOLN: 0.5000 mg | Freq: Once | INTRAMUSCULAR | Status: DC | PRN

## 2018-01-05 MED ORDER — ONDANSETRON HCL 4 MG/2ML IJ SOLN
INTRAMUSCULAR | Status: DC | PRN
  Administered 2018-01-05: 4 mg via INTRAVENOUS

## 2018-01-05 MED ORDER — EPHEDRINE 5 MG/ML INJ
INTRAVENOUS | Status: AC
  Filled 2018-01-05: qty 20

## 2018-01-05 MED ORDER — PROMETHAZINE HCL 25 MG/ML IJ SOLN: 6.2500 mg | INTRAMUSCULAR | Status: DC | PRN

## 2018-01-05 MED ORDER — EPHEDRINE SULFATE 50 MG/ML IJ SOLN
INTRAMUSCULAR | Status: DC | PRN
  Administered 2018-01-05: 5 mg via INTRAVENOUS
  Administered 2018-01-05: 10 mg via INTRAVENOUS

## 2018-01-05 MED ORDER — PHENYLEPHRINE 40 MCG/ML (10ML) SYRINGE FOR IV PUSH (FOR BLOOD PRESSURE SUPPORT)
PREFILLED_SYRINGE | INTRAVENOUS | Status: AC
  Filled 2018-01-05: qty 10

## 2018-01-05 SURGICAL SUPPLY — 23 items
CLOTH BEACON ORANGE TIMEOUT ST (SAFETY) ×3 IMPLANT
COVER LIGHT HANDLE STERIS (MISCELLANEOUS) ×6 IMPLANT
GAUZE 4X4 16PLY RFD (DISPOSABLE) ×3 IMPLANT
GLOVE BIOGEL PI IND STRL 7.0 (GLOVE) ×2 IMPLANT
GLOVE BIOGEL PI IND STRL 8 (GLOVE) ×1 IMPLANT
GLOVE BIOGEL PI INDICATOR 7.0 (GLOVE) ×4
GLOVE BIOGEL PI INDICATOR 8 (GLOVE) ×2
GLOVE SURG SS PI 6.5 STRL IVOR (GLOVE) ×2 IMPLANT
GLOVE SURG SS PI 8.0 STRL IVOR (GLOVE) ×2 IMPLANT
GOWN STRL REUS W/TWL LRG LVL3 (GOWN DISPOSABLE) ×4 IMPLANT
GOWN STRL REUS W/TWL XL LVL3 (GOWN DISPOSABLE) ×3 IMPLANT
INST SET HYSTEROSCOPY (KITS) ×3 IMPLANT
IV NS IRRIG 3000ML ARTHROMATIC (IV SOLUTION) ×2 IMPLANT
KIT TURNOVER CYSTO (KITS) ×3 IMPLANT
MANIFOLD NEPTUNE II (INSTRUMENTS) ×3 IMPLANT
NS IRRIG 1000ML POUR BTL (IV SOLUTION) ×3 IMPLANT
PACK BASIC III (CUSTOM PROCEDURE TRAY) ×3
PACK PERI GYN (CUSTOM PROCEDURE TRAY) ×3 IMPLANT
PACK SRG BSC III STRL LF ECLPS (CUSTOM PROCEDURE TRAY) ×1 IMPLANT
PAD ARMBOARD 7.5X6 YLW CONV (MISCELLANEOUS) ×3 IMPLANT
PAD TELFA 3X4 1S STER (GAUZE/BANDAGES/DRESSINGS) ×3 IMPLANT
SET BASIN LINEN APH (SET/KITS/TRAYS/PACK) ×3 IMPLANT
SET IRRIG Y TYPE TUR BLADDER L (SET/KITS/TRAYS/PACK) ×3 IMPLANT

## 2018-01-05 NOTE — H&P (Signed)
Preoperative History and Physical  Debra Miranda is a 72 y.o. G1P1001 with No LMP recorded. Patient is postmenopausal. admitted for a hysteroscopy uterine curettage with removal of endometrial polyp.  CT revealed central uterine mass no bleeding consistent with polyp  PMH:    Past Medical History:  Diagnosis Date  . Abdominal pain    RIGHT LOWER ABD PAIN/ ON AND OFF FOR 2-3 MONTHS  . Allergy   . Cancer (HCC)    LEFT LOWER LEG  . Cataract    RIGHT EYE  . Depression    many years ago, no meds  . Diabetes mellitus without complication ([place])   . Hemorrhoids   . Orthostatic hypotension     PSH:     Past Surgical History:  Procedure Laterality Date  . CATARACT EXTRACTION     RIGHT EYE  . LUMBAR [place] SURGERY  2006  . TONSILLECTOMY     as a child    POb/GynH:      OB History    Gravida  1   Para  1   Term  1   Preterm      AB      Living  1     SAB      TAB      Ectopic      Multiple      Live Births              SH:   Social History   Tobacco Use  . Smoking status: Current Every Day Smoker    Years: 45.00    Types: Cigars    Last attempt to quit: 07/29/2010    Years since quitting: 7.4  . Smokeless tobacco: Never Used  . Tobacco comment: 4 small cigars a day  Substance Use Topics  . Alcohol use: No  . Drug use: No    FH:    Family History  Problem Relation Age of Onset  . Cancer Mother        cervical, metastasis  . Stroke Father   . Pulmonary fibrosis Brother   . Alzheimer's disease Brother   . Diabetes Brother   . Stroke Paternal Grandfather   . Colon cancer Neg Hx      Allergies:  Allergies  Allergen Reactions  . Latex Itching    Medications:       Current Facility-Administered Medications:  .  ceFAZolin (ANCEF) IVPB 2g/100 mL premix, 2 g, Intravenous, On Call to OR, Florian Buff, MD  Review of Systems:   Review of Systems  Constitutional: Negative for fever, chills, weight loss, malaise/fatigue and diaphoresis.   HENT: Negative for hearing loss, ear pain, nosebleeds, congestion, sore throat, neck pain, tinnitus and ear discharge.   Eyes: Negative for blurred vision, double vision, photophobia, pain, discharge and redness.  Respiratory: Negative for cough, hemoptysis, sputum production, shortness of breath, wheezing and stridor.   Cardiovascular: Negative for chest pain, palpitations, orthopnea, claudication, leg swelling and PND.  Gastrointestinal: Positive for abdominal pain. Negative for heartburn, nausea, vomiting, diarrhea, constipation, blood in stool and melena.  Genitourinary: Negative for dysuria, urgency, frequency, hematuria and flank pain.  Musculoskeletal: Negative for myalgias, back pain, joint pain and falls.  Skin: Negative for itching and rash.  Neurological: Negative for dizziness, tingling, tremors, sensory change, speech change, focal weakness, seizures, loss of consciousness, weakness and headaches.  Endo/Heme/Allergies: Negative for environmental allergies and polydipsia. Does not bruise/bleed easily.  Psychiatric/Behavioral: Negative for depression, suicidal ideas, hallucinations, memory loss  and substance abuse. The patient is not nervous/anxious and does not have insomnia.      PHYSICAL EXAM:  There were no vitals taken for this visit.    Vitals reviewed. Constitutional: She is oriented to person, place, and time. She appears well-developed and well-nourished.  HENT:  Head: Normocephalic and atraumatic.  Right Ear: External ear normal.  Left Ear: External ear normal.  Nose: Nose normal.  Mouth/Throat: Oropharynx is clear and moist.  Eyes: Conjunctivae and EOM are normal. Pupils are equal, round, and reactive to light. Right eye exhibits no discharge. Left eye exhibits no discharge. No scleral icterus.  Neck: Normal range of motion. Neck supple. No tracheal deviation present. No thyromegaly present.  Cardiovascular: Normal rate, regular rhythm, normal heart sounds and  intact distal pulses.  Exam reveals no gallop and no friction rub.   No murmur heard. Respiratory: Effort normal and breath sounds normal. No respiratory distress. She has no wheezes. She has no rales. She exhibits no tenderness.  GI: Soft. Bowel sounds are normal. She exhibits no distension and no mass. There is tenderness. There is no rebound and no guarding.  Genitourinary:       Vulva is normal without lesions Vagina is pink moist without discharge Cervix normal in appearance and pap is normal Uterus is normal size, contour, position, consistency, mobility, non-tender Adnexa is negative with normal sized ovaries by sonogram  Musculoskeletal: Normal range of motion. She exhibits no edema and no tenderness.  Neurological: She is alert and oriented to person, place, and time. She has normal reflexes. She displays normal reflexes. No cranial nerve deficit. She exhibits normal muscle tone. Coordination normal.  Skin: Skin is warm and dry. No rash noted. No erythema. No pallor.  Psychiatric: She has a normal mood and affect. Her behavior is normal. Judgment and thought content normal.    Labs: Results for orders placed or performed during the hospital encounter of 01/05/18 (from the past 336 hour(s))  Glucose, capillary   Collection Time: 01/05/18  6:39 AM  Result Value Ref Range   Glucose-Capillary 165 (H) 70 - 99 mg/dL  Results for orders placed or performed during the hospital encounter of 01/03/18 (from the past 336 hour(s))  Glucose, capillary   Collection Time: 01/03/18  8:11 AM  Result Value Ref Range   Glucose-Capillary 197 (H) 70 - 99 mg/dL  Hemoglobin A1c   Collection Time: 01/03/18  8:16 AM  Result Value Ref Range   Hgb A1c MFr Bld 8.1 (H) 4.8 - 5.6 %   Mean Plasma Glucose 185.77 mg/dL  CBC   Collection Time: 01/03/18  8:16 AM  Result Value Ref Range   WBC 7.5 4.0 - 10.5 K/uL   RBC 5.59 (H) 3.87 - 5.11 MIL/uL   Hemoglobin 15.9 (H) 12.0 - 15.0 g/dL   HCT 49.7 (H) 36.0 -  46.0 %   MCV 88.9 80.0 - 100.0 fL   MCH 28.4 26.0 - 34.0 pg   MCHC 32.0 30.0 - 36.0 g/dL   RDW 13.3 11.5 - 15.5 %   Platelets 137 (L) 150 - 400 K/uL   nRBC 0.0 0.0 - 0.2 %  Comprehensive metabolic panel   Collection Time: 01/03/18  8:16 AM  Result Value Ref Range   Sodium 137 135 - 145 mmol/L   Potassium 4.2 3.5 - 5.1 mmol/L   Chloride 105 98 - 111 mmol/L   CO2 23 22 - 32 mmol/L   Glucose, Bld 199 (H) 70 - 99 mg/dL  BUN 11 8 - 23 mg/dL   Creatinine, Ser 0.67 0.44 - 1.00 mg/dL   Calcium 9.7 8.9 - 10.3 mg/dL   Total Protein 7.7 6.5 - 8.1 g/dL   Albumin 4.1 3.5 - 5.0 g/dL   AST 18 15 - 41 U/L   ALT 14 0 - 44 U/L   Alkaline Phosphatase 82 38 - 126 U/L   Total Bilirubin 0.7 0.3 - 1.2 mg/dL   GFR calc non Af Amer >60 >60 mL/min   GFR calc Af Amer >60 >60 mL/min   Anion gap 9 5 - 15    EKG: Orders placed or performed during the hospital encounter of 01/03/18  . EKG 12-Lead  . EKG 12-Lead    Imaging Studies: No results found.    Assessment: Endometrial polyp   Patient Active Problem List   Diagnosis Date Noted  . Smokes cigars 12/06/2017  . Nodular basal cell carcinoma (BCC) 09/15/2017  . Foot callus 08/30/2017  . Nonhealing skin ulcer, limited to breakdown of skin ([place]) 08/30/2017  . Claw toe, acquired, right 08/30/2017  . Osteopenia of neck of left femur 08/17/2016  . Irritable bowel syndrome 12/03/2015  . Incontinence of feces with fecal urgency 12/03/2015  . Controlled type 2 diabetes mellitus without complication, without long-term current use of insulin ([place]) 09/09/2015  . Hyperlipidemia associated with type 2 diabetes mellitus ([place]) 09/09/2015  . Depression 09/09/2015    Plan: Hysteroscopy with removal of endometrial polyp  Florian Buff 01/05/2018 7:11 AM

## 2018-01-05 NOTE — Anesthesia Postprocedure Evaluation (Signed)
Anesthesia Post Note  Patient: REILEY BERTAGNOLLI  Procedure(s) Performed: DILATATION AND CURETTAGE/HYSTEROSCOPY (N/A Vagina ) ENDOMETRIAL POLYPECTOMY (N/A Vagina )  Patient location during evaluation: Short Stay Anesthesia Type: General Level of consciousness: awake and alert and patient cooperative Pain management: pain level controlled Vital Signs Assessment: post-procedure vital signs reviewed and stable Respiratory status: spontaneous breathing Cardiovascular status: stable Postop Assessment: no apparent nausea or vomiting Anesthetic complications: no     Last Vitals:  Vitals:   01/05/18 0845 01/05/18 0855  BP: 133/61 126/69  Pulse: 73 68  Resp: 19 20  Temp:  36.8 C  SpO2: 94% 92%    Last Pain:  Vitals:   01/05/18 0855  TempSrc: Oral  PainSc: 0-No pain                 Makoa Satz

## 2018-01-05 NOTE — Anesthesia Preprocedure Evaluation (Signed)
Anesthesia Evaluation  Patient identified by MRN, date of birth, ID band Patient awake    Reviewed: Allergy & Precautions, NPO status , Patient's Chart, lab work & pertinent test results  Airway Mallampati: II  TM Distance: >3 FB Neck ROM: Full    Dental no notable dental hx. (+) Edentulous Upper, Poor Dentition, Missing   Pulmonary neg pulmonary ROS, Current Smoker,  4 cigars/day  Smoked since 18  Denies any pulm meds   Pulmonary exam normal breath sounds clear to auscultation       Cardiovascular Exercise Tolerance: Good negative cardio ROS Normal cardiovascular examI Rhythm:Regular Rate:Normal  Hikes miles  Mows lawn   Neuro/Psych Depression negative neurological ROS  negative psych ROS   GI/Hepatic negative GI ROS, Neg liver ROS, Denies GERD today  Only PRN tums  None recent    Endo/Other  negative endocrine ROSdiabetes  Renal/GU negative Renal ROS  negative genitourinary   Musculoskeletal negative musculoskeletal ROS (+)   Abdominal   Peds negative pediatric ROS (+)  Hematology negative hematology ROS (+)   Anesthesia Other Findings   Reproductive/Obstetrics negative OB ROS                             Anesthesia Physical Anesthesia Plan  ASA: II  Anesthesia Plan: General   Post-op Pain Management:    Induction: Intravenous  PONV Risk Score and Plan:   Airway Management Planned: LMA  Additional Equipment:   Intra-op Plan:   Post-operative Plan:   Informed Consent: I have reviewed the patients History and Physical, chart, labs and discussed the procedure including the risks, benefits and alternatives for the proposed anesthesia with the patient or authorized representative who has indicated his/her understanding and acceptance.   Dental advisory given  Plan Discussed with: CRNA  Anesthesia Plan Comments:         Anesthesia Quick Evaluation

## 2018-01-05 NOTE — Transfer of Care (Signed)
Immediate Anesthesia Transfer of Care Note  Patient: Debra Miranda  Procedure(s) Performed: DILATATION AND CURETTAGE/HYSTEROSCOPY (N/A Vagina ) ENDOMETRIAL POLYPECTOMY (N/A Vagina )  Patient Location: PACU  Anesthesia Type:General  Level of Consciousness: awake, alert  and patient cooperative  Airway & Oxygen Therapy: Patient Spontanous Breathing  Post-op Assessment: Report given to RN and Post -op Vital signs reviewed and stable  Post vital signs: Reviewed and stable  Last Vitals:  Vitals Value Taken Time  BP    Temp    Pulse 77 01/05/2018  8:25 AM  Resp 16 01/05/2018  8:25 AM  SpO2 95 % 01/05/2018  8:25 AM  Vitals shown include unvalidated device data.  Last Pain:  Vitals:   01/05/18 0700  PainSc: 0-No pain         Complications: No apparent anesthesia complications

## 2018-01-05 NOTE — Discharge Instructions (Signed)
Dilation and Curettage or Vacuum Curettage, Care After  These instructions give you information about caring for yourself after your procedure. Your doctor may also give you more specific instructions. Call your doctor if you have any problems or questions after your procedure.  Follow these instructions at home:  Activity   · Do not drive or use heavy machinery while taking prescription pain medicine.  · For 24 hours after your procedure, avoid driving.  · Take short walks often, followed by rest periods. Ask your doctor what activities are safe for you. After one or two days, you may be able to return to your normal activities.  · Do not lift anything that is heavier than 10 lb (4.5 kg) until your doctor approves.  · For at least 2 weeks, or as long as told by your doctor:  ? Do not douche.  ? Do not use tampons.  ? Do not have sex.  General instructions   · Take over-the-counter and prescription medicines only as told by your doctor. This is very important if you take blood thinning medicine.  · Do not take baths, swim, or use a hot tub until your doctor approves. Take showers instead of baths.  · Wear compression stockings as told by your doctor.  · It is up to you to get the results of your procedure. Ask your doctor when your results will be ready.  · Keep all follow-up visits as told by your doctor. This is important.  Contact a doctor if:  · You have very bad cramps that get worse or do not get better with medicine.  · You have very bad pain in your belly (abdomen).  · You cannot drink fluids without throwing up (vomiting).  · You get pain in a different part of the area between your belly and thighs (pelvis).  · You have bad-smelling discharge from your vagina.  · You have a rash.  Get help right away if:  · You are bleeding a lot from your vagina. A lot of bleeding means soaking more than one sanitary pad in an hour, for 2 hours in a row.  · You have clumps of blood (blood clots) coming from your  vagina.  · You have a fever or chills.  · Your belly feels very tender or hard.  · You have chest pain.  · You have trouble breathing.  · You cough up blood.  · You feel dizzy.  · You feel light-headed.  · You pass out (faint).  · You have pain in your neck or shoulder area.  Summary  · Take short walks often, followed by rest periods. Ask your doctor what activities are safe for you. After one or two days, you may be able to return to your normal activities.  · Do not lift anything that is heavier than 10 lb (4.5 kg) until your doctor approves.  · Do not take baths, swim, or use a hot tub until your doctor approves. Take showers instead of baths.  · Contact your doctor if you have any symptoms of infection, like bad-smelling discharge from your vagina.  This information is not intended to replace advice given to you by your health care provider. Make sure you discuss any questions you have with your health care provider.  Document Released: 11/12/2007 Document Revised: 10/21/2015 Document Reviewed: 10/21/2015  Elsevier Interactive Patient Education © 2017 [employer]

## 2018-01-05 NOTE — Anesthesia Procedure Notes (Signed)
Procedure Name: LMA Insertion Date/Time: 01/05/2018 7:34 AM Performed by: Vista Deck, CRNA Pre-anesthesia Checklist: Patient identified, Patient being monitored, Emergency Drugs available, Timeout performed and Suction available Patient Re-evaluated:Patient Re-evaluated prior to induction Oxygen Delivery Method: Circle System Utilized Preoxygenation: Pre-oxygenation with 100% oxygen Induction Type: IV induction Ventilation: Mask ventilation without difficulty LMA: LMA inserted LMA Size: 4.0 Number of attempts: 1 Placement Confirmation: positive ETCO2 and breath sounds checked- equal and bilateral Tube secured with: Tape Dental Injury: Teeth and Oropharynx as per pre-operative assessment

## 2018-01-05 NOTE — Op Note (Signed)
Preoperative diagnosis: Endometrial mass, most likely a polyp   Postoperative diagnosis: Endometrial polyp                                            Endometrium is otherwise normal                                           (photo documented in the media tab of the electronic chart)   Procedure: Operative hysteroscopy with removal of polyp, Surgeon: Florian Buff   Anesthesia: Laryngeal mask airway   Findings: The patient had a CT scan for an unrelated reason and was found to have a central endometrial mass. I reviewed and it appeared to be a polyp.  Pt has not experienced any vaginal bleeding  There was a single benign appearing endometrial polyp and the remainder of the endometrium appeared normal  Description of operation: Patient was taken to the operating room and placed in the supine position where she underwent laryngeal mask airway anesthesia. She was placed in the dorsal lithotomy position.  She was prepped and draped in the usual sterile fashion.  A Graves speculum was placed.  The cervix was grasped with a single-tooth tenaculum.  The cervix was dilated serially using Hegar dilators to allow passage of the hysteroscope.  The hysteroscope was then placed into the endometrial cavity without difficulty and the above noted findings were seen.  Randall stone forceps ring forceps and the uterine curet were used to remove the  Polyp The endometrium was thoroughly curetted with good uterine cry in all areas  Hysteroscopic reevaluation confirmed the removal of all endometrial pathology  There was good hemostasis   The patient tolerated the procedure well  She experienced minimal blood loss 50 cc She was taken to the recovery room in good stable condition  All counts were correct x3  She received 2 g of Ancef and 30 mg of Toradol preoperatively  She will be followed up in the office next week for postoperative visit and to review the pathology which I expect to be benign  Florian Buff, MD 01/05/2018 8:15 AM

## 2018-01-06 ENCOUNTER — Encounter (HOSPITAL_COMMUNITY): Payer: Self-pay | Admitting: Obstetrics & Gynecology

## 2018-01-12 ENCOUNTER — Other Ambulatory Visit: Payer: Self-pay

## 2018-01-12 ENCOUNTER — Encounter: Payer: Self-pay | Admitting: Obstetrics & Gynecology

## 2018-01-12 ENCOUNTER — Ambulatory Visit: Payer: Medicare PPO | Admitting: Obstetrics & Gynecology

## 2018-01-12 VITALS — Ht 67.0 in | Wt 189.0 lb

## 2018-01-12 DIAGNOSIS — C541 Malignant neoplasm of endometrium: Secondary | ICD-10-CM

## 2018-01-12 NOTE — Progress Notes (Signed)
  HPI: Patient returns for routine postoperative follow-up having undergone hysteroscopy uterine curettage removal of endometrial polyp  on 01/05/2018.  The patient's immediate postoperative recovery has been unremarkable. Since hospital discharge the patient reports some spotting but no problems.   Current Outpatient Medications: atorvastatin (LIPITOR) 20 MG tablet, Take 1 tablet (20 mg total) by mouth daily. (Patient taking differently: Take 20 mg by mouth at bedtime. ), Disp: 90 tablet, Rfl: 3 Calcium Carbonate-Vit D-Min (CALCIUM 1200 PO), Take 1 tablet by mouth at bedtime. , Disp: , Rfl:  Cholecalciferol (VITAMIN D) 2000 UNITS CAPS, Take 2,000 Units by mouth at bedtime. , Disp: , Rfl:  Cinnamon 500 MG capsule, Take 500 mg by mouth at bedtime. , Disp: , Rfl:  escitalopram (LEXAPRO) 10 MG tablet, Take 1 tablet (10 mg total) by mouth daily. (Patient taking differently: Take 10 mg by mouth at bedtime. ), Disp: 90 tablet, Rfl: 3 metFORMIN (GLUCOPHAGE XR) 500 MG 24 hr tablet, Take 1 tablet (500 mg total) by mouth daily with breakfast., Disp: 90 tablet, Rfl: 1 Omega-3 Fatty Acids (FISH OIL) 1000 MG CAPS, Take 1,000 mg by mouth at bedtime. , Disp: , Rfl:  sitaGLIPtin (JANUVIA) 100 MG tablet, Take 100 mg by mouth at bedtime. , Disp: , Rfl:  diphenhydrAMINE (BENADRYL) 25 MG tablet, Take 50 mg by mouth at bedtime., Disp: , Rfl:  ketorolac (TORADOL) 10 MG tablet, Take 1 tablet (10 mg total) by mouth every 8 (eight) hours as needed. (Patient not taking: Reported on 01/12/2018), Disp: 15 tablet, Rfl: 0 ondansetron (ZOFRAN ODT) 8 MG disintegrating tablet, Take 1 tablet (8 mg total) by mouth every 8 (eight) hours as needed for nausea or vomiting. (Patient not taking: Reported on 01/12/2018), Disp: 10 tablet, Rfl: 0  No current facility-administered medications for this visit.     Height 5\' 7"  (1.702 m), weight 189 lb (85.7 kg).  Physical Exam: Normal post hysteroscopy  Diagnostic  Tests:   Pathology: Grade 2 endometrioid adenocarcinoma  Impression: S/p hysteroscopy uterine curettage polyp removla  Plan: Refer to Dr Denman George for evaluation and management for endometrial cancer  Follow up: prn    Florian Buff, MD

## 2018-01-17 ENCOUNTER — Other Ambulatory Visit: Payer: Self-pay | Admitting: *Deleted

## 2018-01-17 DIAGNOSIS — C541 Malignant neoplasm of endometrium: Secondary | ICD-10-CM

## 2018-01-20 ENCOUNTER — Encounter: Payer: Self-pay | Admitting: Obstetrics

## 2018-01-20 ENCOUNTER — Inpatient Hospital Stay: Payer: Medicare PPO | Attending: Obstetrics | Admitting: Obstetrics

## 2018-01-20 VITALS — BP 130/68 | HR 86 | Temp 98.5°F | Resp 20 | Ht 67.0 in | Wt 189.0 lb

## 2018-01-20 DIAGNOSIS — Z809 Family history of malignant neoplasm, unspecified: Secondary | ICD-10-CM

## 2018-01-20 DIAGNOSIS — C541 Malignant neoplasm of endometrium: Secondary | ICD-10-CM

## 2018-01-20 NOTE — Patient Instructions (Signed)
Preparing for your Surgery  Plan for surgery on December 24. 2019 with Dr. Precious Haws at [place] will be scheduled for a robotic assisted total hysterectomy, bilateral salpingo-oophorectomy, sentinel lymph node biopsy, possible staging.   Pre-operative Testing -You will receive a phone call from presurgical testing at [facility] to arrange for a pre-operative testing appointment before your surgery.  This appointment normally occurs one to two weeks before your scheduled surgery.   -Bring your insurance card, copy of an advanced directive if applicable, medication list  -At that visit, you will be asked to sign a consent for a possible blood transfusion in case a transfusion becomes necessary during surgery.  The need for a blood transfusion is rare but having consent is a necessary part of your care.     -You should not be taking blood thinners or aspirin at least ten days prior to surgery unless instructed by your surgeon.  Day Before Surgery at [place] will be asked to take in a light diet the day before surgery.  Avoid carbonated beverages.  You will be advised to have nothing to eat or drink after midnight the evening before.    Eat a light diet the day before surgery.  Examples including soups, broths, toast, yogurt, mashed potatoes.  Things to avoid include carbonated beverages (fizzy beverages), raw fruits and raw vegetables, or beans.   If your bowels are filled with gas, your surgeon will have difficulty visualizing your pelvic organs which increases your surgical risks.  Your role in recovery Your role is to become active as soon as directed by your doctor, while still giving yourself time to heal.  Rest when you feel tired. You will be asked to do the following in order to speed your recovery:  - Cough and breathe deeply. This helps toclear and expand your lungs and can prevent pneumonia. You may be given a spirometer to practice deep  breathing. A staff member will show you how to use the spirometer. - Do mild physical activity. Walking or moving your legs help your circulation and body functions return to normal. A staff member will help you when you try to walk and will provide you with simple exercises. Do not try to get up or walk alone the first time. - Actively manage your pain. Managing your pain lets you move in comfort. We will ask you to rate your pain on a scale of zero to 10. It is your responsibility to tell your doctor or nurse where and how much you hurt so your pain can be treated.  Special Considerations -If you are diabetic, you may be placed on insulin after surgery to have closer control over your blood sugars to promote healing and recovery.  This does not mean that you will be discharged on insulin.  If applicable, your oral antidiabetics will be resumed when you are tolerating a solid diet.  -Your final pathology results from surgery should be available around one week after surgery and the results will be relayed to you when available.  -Dr. Lahoma Crocker is the Surgeon that assists your GYN Oncologist with surgery.  The next day after your surgery you will either see your GYN Oncologist, Dr. Everitt Amber, or Dr. Lahoma Crocker.  -FMLA forms can be faxed to [phone] and please allow 5-7 business days for completion.   Blood Transfusion Information WHAT IS A BLOOD TRANSFUSION? A transfusion is the replacement of blood or some of its parts. Blood  is made up of multiple cells which provide different functions.  Red blood cells carry oxygen and are used for blood loss replacement.  White blood cells fight against infection.  Platelets control bleeding.  Plasma helps clot blood.  Other blood products are available for specialized needs, such as hemophilia or other clotting disorders. BEFORE THE TRANSFUSION  Who gives blood for transfusions?   You may be able to donate blood to be used at  a later date on yourself (autologous donation).  Relatives can be asked to donate blood. This is generally not any safer than if you have received blood from a stranger. The same precautions are taken to ensure safety when a relative's blood is donated.  Healthy volunteers who are fully evaluated to make sure their blood is safe. This is blood bank blood. Transfusion therapy is the safest it has ever been in the practice of medicine. Before blood is taken from a donor, a complete history is taken to make sure that person has no history of diseases nor engages in risky social behavior (examples are intravenous drug use or sexual activity with multiple partners). The donor's travel history is screened to minimize risk of transmitting infections, such as malaria. The donated blood is tested for signs of infectious diseases, such as HIV and hepatitis. The blood is then tested to be sure it is compatible with you in order to minimize the chance of a transfusion reaction. If you or a relative donates blood, this is often done in anticipation of surgery and is not appropriate for emergency situations. It takes many days to process the donated blood. RISKS AND COMPLICATIONS Although transfusion therapy is very safe and saves many lives, the main dangers of transfusion include:   Getting an infectious disease.  Developing a transfusion reaction. This is an allergic reaction to something in the blood you were given. Every precaution is taken to prevent this. The decision to have a blood transfusion has been considered carefully by your caregiver before blood is given. Blood is not given unless the benefits outweigh the risks.

## 2018-01-21 ENCOUNTER — Encounter: Payer: Self-pay | Admitting: Obstetrics

## 2018-01-21 DIAGNOSIS — C541 Malignant neoplasm of endometrium: Secondary | ICD-10-CM | POA: Insufficient documentation

## 2018-01-21 NOTE — Progress Notes (Incomplete)
[place] at [facility] Note: New Patient First Visit   Consult was requested by Dr. Tania Ade for Grade 2 Endometrial Cancer on D&C/Polypectomy   Chief Complaint  Patient presents with  . Endometrial adenocarcinoma [facility])    GYN Oncologic Summary 1. TBD o .  HPI: [name]  is a very nice 72 y.o.  P1  The patient noted right sided pain about 2 months ago. She does have a h/o chrnoic back pain for which she had to see pain management for a time. She has not required  narcotic pain medications for some time now. Based on her complaints a CTScan was ordered by her PCP. This revealed "constipation", a fat-containing large mouthed hernia, and "central uterine hypoattenuation at the fundus of 67mm"  (read by Abigail Miyamoto, MD)  Given the CT findings a GYN ultrasound was ordered. This revealed a 6.7x3.3x3.8cm uterus with a hyperechoic area central uterus 1.5x0.8cm suspicious for a polyp.  The patient had no bleeding symptoms.  Given this information she was referred to Dr. Elonda Husky and workup ensued including [employer] D&C with findings showing a single polyp and grossly normal endometrium.  Unfortunately the polyp returned with Grade 2 Endometrioid Adenocarcinoma "partially involving a polyp".    Imported EPIC Oncologic History:   No history exists.    Measurement of disease: . Marland Kitchen  Radiology: No results found. .  .   Outpatient Encounter Medications as of 01/20/2018  Medication Sig  . atorvastatin (LIPITOR) 20 MG tablet Take 1 tablet (20 mg total) by mouth daily. (Patient taking differently: Take 20 mg by mouth at bedtime. )  . Calcium Carbonate-Vit D-Min (CALCIUM 1200 PO) Take 1 tablet by mouth at bedtime.   . Cholecalciferol (VITAMIN D) 2000 UNITS CAPS Take 2,000 Units by mouth at bedtime.   . Cinnamon 500 MG capsule Take 500 mg by mouth at bedtime.   Marland Kitchen escitalopram (LEXAPRO) 10 MG tablet Take 1 tablet (10 mg total) by mouth  daily. (Patient taking differently: Take 10 mg by mouth at bedtime. )  . metFORMIN (GLUCOPHAGE XR) 500 MG 24 hr tablet Take 1 tablet (500 mg total) by mouth daily with breakfast.  . Omega-3 Fatty Acids (FISH OIL) 1000 MG CAPS Take 1,000 mg by mouth at bedtime.   . sitaGLIPtin (JANUVIA) 100 MG tablet Take 100 mg by mouth at bedtime.   . [DISCONTINUED] diphenhydrAMINE (BENADRYL) 25 MG tablet Take 50 mg by mouth at bedtime.  . [DISCONTINUED] ketorolac (TORADOL) 10 MG tablet Take 1 tablet (10 mg total) by mouth every 8 (eight) hours as needed.  . [DISCONTINUED] ondansetron (ZOFRAN ODT) 8 MG disintegrating tablet Take 1 tablet (8 mg total) by mouth every 8 (eight) hours as needed for nausea or vomiting. (Patient not taking: Reported on 01/12/2018)   No facility-administered encounter medications on file as of 01/20/2018.    Allergies  Allergen Reactions  . Latex Itching    Past Medical History:  Diagnosis Date  . Abdominal pain    RIGHT LOWER ABD PAIN/ ON AND OFF FOR 2-3 MONTHS  . Allergy   . Cancer (HCC)    LEFT LOWER LEG  . Cataract    RIGHT EYE  . Depression    many years ago, no meds  . Diabetes mellitus without complication ([place])   . Hemorrhoids   . Orthostatic hypotension    Past Surgical History:  Procedure Laterality Date  . CATARACT EXTRACTION  RIGHT EYE  . HYSTEROSCOPY W/D&C N/A 01/05/2018   Procedure: DILATATION AND CURETTAGE/HYSTEROSCOPY;  Surgeon: Florian Buff, MD;  Location: [facility];  Service: Gynecology;  Laterality: N/A;  . LUMBAR [place] SURGERY  2006  . POLYPECTOMY N/A 01/05/2018   Procedure: ENDOMETRIAL POLYPECTOMY;  Surgeon: Florian Buff, MD;  Location: [facility];  Service: Gynecology;  Laterality: N/A;  . TONSILLECTOMY     as a child        Past Gynecological History:   GYNECOLOGIC HISTORY:  . No LMP recorded. Patient is postmenopausal. *** . Menarche: *** years old . P *** . Contraceptive*** . HRT ***  . Last Pap *** Family Hx:  Family History   Problem Relation Age of Onset  . Cancer Mother        cervical, metastasis  . Stroke Father   . Pulmonary fibrosis Brother   . Alzheimer's disease Brother   . Diabetes Brother   . Stroke Paternal Grandfather   . Colon cancer Neg Hx    Social Hx:  Marland Kitchen Tobacco use: *** . Alcohol use: *** . Illicit Drug use: *** . Illicit IV Drug use: ***    Review of Systems: Review of Systems - Oncology ***  Vitals:  Vitals:   01/20/18 1530  BP: 130/68  Pulse: 86  Resp: 20  Temp: 98.5 F (36.9 C)  SpO2: 98%   Vitals:   01/20/18 1530  Weight: 189 lb (85.7 kg)  Height: 5\' 7"  (1.702 m)   Body mass index is 29.6 kg/m.  Physical Exam: General :  ***Well developed, 72 y.o., female in no apparent distress HEENT:  Normocephalic/atraumatic, symmetric, EOMI, eyelids normal Neck:   Supple, no masses.  Lymphatics:  No cervical/ submandibular/ supraclavicular/ infraclavicular/ inguinal adenopathy Respiratory:  Respirations unlabored, no use of accessory muscles CV:   Deferred Breast:  Deferred Musculoskeletal: No CVA tenderness, normal muscle strength. Abdomen:  *** Soft, non-tender and nondistended. No evidence of hernia. No masses. Extremities:  No lymphedema, no erythema, non-tender. Skin:   Normal inspection Neuro/Psych:  No focal motor deficit, no abnormal mental status. Normal gait. Normal affect. Alert and oriented to person, place, and time  Genito Urinary: Vulva: ***Normal external female genitalia.  Bladder/urethra: Urethral meatus normal in size and location. No lesions or   masses, well supported bladder ***Speculum exam: Vagina: ***No lesion, no discharge, no bleeding. Cervix: ***Normal appearing, no lesions. Bimanual exam: *** Uterus: ***Normal size, mobile.  Adnexal region: ***No masses. Rectovaginal:  ***Good tone, no masses, no cul de sac nodularity, no parametrial involvement or nodularity.   Assessment  *** ECOG PERFORMANCE STATUS: {CHL ONC ECOG  ZO:[id number]}  Plan  Complexity of visit ? This is a new problem and *** additional workup is planned ? Data reviewed ? *** I independently reviewed the images and the radiology reports and discussed my interpretation in the presence of *** ? *** ? I reviewed her referring doctor's office notes and I have summarized in the HPI ? History was obtained from *** ? We reviewed *** tumor marker ? This is an ***acute illness that may pose a threat to life ***undiagnosed new problem with uncertain prognosis (lump/mass) 1. *** ? *** 2. *** ? *** 3. *** o ***  2. Counseling for uterine cancer ? We discussed the diagnosis of uterine cancer the typical presentation and prognosis ? We reviewed the difference between grade and stage ? Standard of care hysterectomy versus alternatives of hormones and radiation were reviewed ? We discussed the possibility  of additional treatments following surgery, such as radiation and/or chemotherapy 3. Surgical management ? Patient would like to proceed with standard of care management i.e. Hysterectomy ? Reviewed the options for modalities of hysterectomy ? Surgical sketch was reviewed including the risks, benefits, and alternatives.  She was given a copy of this today and one will be placed in the chart. ? Plan will be for robotic assisted laparoscopic hysterectomy, BSO, sentinel lymph node biopsy, washings, and possible full staging ? She has a hernia at the umbilicus which is wide mouthed and fat-containing. Likely I will need to take some omentum down from there but will plan to keep incisions away from the hernia 4. Preoperative items will include  ? Obtaining a chest x-ray ? *** Pap smear ? *** clearance 5. *** were present for the discussion with no further questions after review of the above 6. I will plan to have her return in 10 to 14 days postop to review the pathology and discuss any further management      Face to face time with patient was  *** minutes. Over 50% of this time was spent on counseling and coordination of care.   Mart Piggs, MD Gynecologic Oncologist 01/21/2018, 11:10 AM    Cc: *** (Referring ***) *** (PCP)

## 2018-01-21 NOTE — Progress Notes (Unsigned)
[place] at [facility] Note: New Patient First Visit   Consult was requested by Dr. Tania Ade for Grade 2 Endometrial Cancer on D&C/Polypectomy    Chief Complaint  Patient presents with  . Endometrial adenocarcinoma [facility])    GYN Oncologic Summary 1. TBD o .  HPI: [name]  is a very nice 72 y.o.  P0  The patient noted right sided pain about 2 months ago. She does have a h/o chrnoic back pain for which she had to see pain management for a time. She has not required  narcotic pain medications for some time now. Based on her complaints a CTScan was ordered by her PCP. This revealed "constipation", a fat-containing large mouthed hernia, and "central uterine hypoattenuation at the fundus of 23m"  (read by KAbigail Miyamoto MD)  Given the CT findings a GYN ultrasound was ordered. This revealed a 6.7x3.3x3.8cm uterus with a hyperechoic area central uterus 1.5x0.8cm suspicious for a polyp.  The patient had no bleeding symptoms.  Given this information she was referred to Dr. EElonda [facility]and [facility] showing a single polyp and grossly normal endometrium.  Unfortunately the polyp returned with Grade 2 Endometrioid Adenocarcinoma "partially involving a polyp".  Imported EPIC Oncologic History:   No history exists.    Measurement of disease: . TBD  Radiology: No results found. .  .   Outpatient Encounter Medications as of 01/20/2018  Medication Sig  . atorvastatin (LIPITOR) 20 MG tablet Take 1 tablet (20 mg total) by mouth daily. (Patient taking differently: Take 20 mg by mouth at bedtime. )  . Calcium Carbonate-Vit D-Min (CALCIUM 1200 PO) Take 1 tablet by mouth at bedtime.   . Cholecalciferol (VITAMIN D) 2000 UNITS CAPS Take 2,000 Units by mouth at bedtime.   . Cinnamon 500 MG capsule Take 500 mg by mouth at bedtime.   .Marland Kitchenescitalopram (LEXAPRO) 10 MG tablet Take 1 tablet (10 mg total) by  mouth daily. (Patient taking differently: Take 10 mg by mouth at bedtime. )  . metFORMIN (GLUCOPHAGE XR) 500 MG 24 hr tablet Take 1 tablet (500 mg total) by mouth daily with breakfast.  . Omega-3 Fatty Acids (FISH OIL) 1000 MG CAPS Take 1,000 mg by mouth at bedtime.   . sitaGLIPtin (JANUVIA) 100 MG tablet Take 100 mg by mouth at bedtime.   . [DISCONTINUED] diphenhydrAMINE (BENADRYL) 25 MG tablet Take 50 mg by mouth at bedtime.  . [DISCONTINUED] ketorolac (TORADOL) 10 MG tablet Take 1 tablet (10 mg total) by mouth every 8 (eight) hours as needed.  . [DISCONTINUED] ondansetron (ZOFRAN ODT) 8 MG disintegrating tablet Take 1 tablet (8 mg total) by mouth every 8 (eight) hours as needed for nausea or vomiting. (Patient not taking: Reported on 01/12/2018)   No facility-administered encounter medications on file as of 01/20/2018.    Allergies  Allergen Reactions  . Latex Itching    Past Medical History:  Diagnosis Date  . Cataract    RIGHT EYE; now removed  . Depression    many years ago, no meds  . Diabetes mellitus without complication ([place]   . Environmental allergies   . Hemorrhoids   . Orthostatic hypotension    Patient is unaware of this diagnosis  . Skin cancer 08/2017   LEFT LOWER LEG   Past Surgical History:  Procedure Laterality Date  . CATARACT EXTRACTION     RIGHT EYE  . HYSTEROSCOPY W/D&C N/A  01/05/2018   Procedure: DILATATION AND CURETTAGE/HYSTEROSCOPY;  Surgeon: Florian Buff, MD;  Location: [facility];  Service: Gynecology;  Laterality: N/A;  . LUMBAR [place] SURGERY  2006  . POLYPECTOMY N/A 01/05/2018   Procedure: ENDOMETRIAL POLYPECTOMY;  Surgeon: Florian Buff, MD;  Location: [facility];  Service: Gynecology;  Laterality: N/A;  . TONSILLECTOMY     as a child        Past Gynecological History:   GYNECOLOGIC HISTORY:  . No LMP recorded. Patient is postmenopausal. age 84 . Menarche: 72 years old . P 1 . Contraceptive OCP and IUD . HRT NOne  . Last Pap several yearss,  but no h/o abnormal paps Family Hx:  Family History  Problem Relation Age of Onset  . Cervical cancer Mother        treated with radiation  . Pancreatic cancer Mother   . Stroke Father   . Pulmonary fibrosis Brother   . Alzheimer's disease Brother   . Stomach cancer Brother   . Diabetes Brother   . Stroke Paternal Grandfather   . Uterine cancer Maternal Aunt   . Lung cancer Other   . Kidney cancer Maternal Uncle   . Colon cancer Maternal Uncle   . Breast cancer Cousin        cousin   Social Hx:  Marland Kitchen Tobacco use: cigars ~ 4/ day . Alcohol use: 2 per year . Illicit Drug use: none . Illicit IV Drug use: none    Review of Systems: Review of Systems  Constitutional: Positive for fatigue.  Genitourinary: Positive for frequency.   Musculoskeletal: Positive for arthralgias.  All other systems reviewed and are negative.   Vitals:  Vitals:   01/20/18 1530  BP: 130/68  Pulse: 86  Resp: 20  Temp: 98.5 F (36.9 C)  SpO2: 98%   Vitals:   01/20/18 1530  Weight: 189 lb (85.7 kg)  Height: _0  (1.702 m)   Body mass index is 29.6 kg/m.  Physical Exam: General :  Appears stated age. Well developed, 72 y.o., female in no apparent distress HEENT:  Normocephalic/atraumatic, symmetric, EOMI, eyelids normal Neck:   Supple, no masses.  Lymphatics:  No cervical/ submandibular/ supraclavicular/ infraclavicular/ inguinal adenopathy Respiratory:  Respirations unlabored, no use of accessory muscles CV:   Deferred Breast:  Deferred Musculoskeletal: No CVA tenderness, normal muscle strength. Abdomen:   Soft, non-tender and nondistended Notable for hernia No masses. Extremities:  No lymphedema, no erythema, non-tender. Skin:   Normal inspection Neuro/Psych:  No focal motor deficit, no abnormal mental status. Normal gait. Normal affect. Alert and oriented to person, place, and time  Genito Urinary: Vulva: Normal external female genitalia.  Bladder/urethra: Urethral meatus normal in  size and location. No lesions or   masses, well supported bladder Speculum exam: Vagina: No lesion, no discharge, no bleeding. Cervix: Normal appearing, no lesions. Bimanual exam:  Uterus: Normal size, mobile.  Adnexal region: No masses. Rectovaginal:  Good tone, no masses, no cul de sac nodularity, no parametrial involvement or nodularity.   Assessment  Endometrial Cancer ECOG PERFORMANCE STATUS: 1 - Symptomatic but completely ambulatory  Plan  Complexity of visit ? This is a new problem and no additional workup is planned ? Data reviewed ? I independently reviewed the images and the radiology reports and discussed my interpretation in the presence of the patient today ? I believe there is omentum in the hernia. ? Likely this will deliver out of the wide mouth to allow visualization for the  hysterectomy to be done robotically ? I reviewed her referring doctor's office notes and I have summarized in the HPI ? History was obtained from the patient and the chart ? This is an acute illness (cancer) that may pose a threat to life if left untreated ? Management is going to include major surgery with comorbidities being her age, tobacco use, hernia, and diabetes. 1. Counseling for uterine cancer ? We discussed the diagnosis of uterine cancer the typical presentation and prognosis ? We reviewed the difference between grade and stage ? Standard of care hysterectomy versus alternatives of hormones and radiation were reviewed ? We discussed the possibility of additional treatments following surgery, such as radiation and/or chemotherapy 2. Surgical management ? Patient would like to proceed with standard of care management i.e. Hysterectomy ? Reviewed the options for modalities of hysterectomy ? Surgical sketch was reviewed including the risks, benefits, and alternatives.  She was given a copy of this today and one will be placed in the chart. ? Plan will be for robotic assisted laparoscopic  hysterectomy, BSO, sentinel lymph node biopsy, washings, and possible full staging ? NOTE the hernia and surgery will need address the omentum that is contained within. I will need to avoid this area for the trocars 3. Preoperative items will include  ? Obtaining a chest x-ray ? Hgb A1C 4. She has strong family history of CA; regardless of her MMR we should refer her to genetic counseling. This was discussed today 5. The patient had no further questions after review of the above 6. I will plan to have her return in 10 to 14 days postop to review the pathology and discuss any further management    Mart Piggs, MD Gynecologic Oncologist 01/21/2018, 11:51 AM    Cc: Tania Ade, MD (Referring Ob/Gyn) [name], DO  (PCP)

## 2018-02-01 NOTE — Patient Instructions (Addendum)
Debra Miranda  02/01/2018   Your procedure is scheduled on: 02-08-18  Report to [facility] Main  Entrance  Report to admitting at      0530  AM    Call this number if you have problems the morning of surgery [phone]   Eat a light diet the day before surgery.  Examples including soups, broths, toast, yogurt, mashed potatoes.  Things to avoid include carbonated beverages (fizzy beverages), raw fruits and raw vegetables, or beans.   If your bowels are filled with gas, your surgeon will have difficulty visualizing your pelvic organs which increases your surgical risks.   Remember: Do not eat food or drink liquids :After Midnight.  BRUSH YOUR TEETH MORNING OF SURGERY AND RINSE YOUR MOUTH OUT, NO CHEWING GUM CANDY OR MINTS.     Take these medicines the morning of surgery with A SIP OF WATER: none DO NOT TAKE ANY DIABETIC MEDICATIONS DAY OF YOUR SURGERY                               You may not have any metal on your body including hair pins and              piercings  Do not wear jewelry, make-up, lotions, powders or perfumes, deodorant             Do not wear nail polish.  Do not shave  48 hours prior to surgery.     Do not bring valuables to the hospital. [place].  Contacts, dentures or bridgework may not be worn into surgery.  Leave suitcase in the car. After surgery it may be brought to your room.                  Please read over the following fact sheets you were given: _____________________________________________________________________          [facility] - Preparing for Surgery Before surgery, you can play an important role.  Because skin is not sterile, your skin needs to be as free of germs as possible.  You can reduce the number of germs on your skin by washing with CHG (chlorahexidine gluconate) soap before surgery.  CHG is an antiseptic cleaner which kills germs and bonds with the skin to  continue killing germs even after washing. Please DO NOT use if you have an allergy to CHG or antibacterial soaps.  If your skin becomes reddened/irritated stop using the CHG and inform your nurse when you arrive at Short Stay. Do not shave (including legs and underarms) for at least 48 hours prior to the first CHG shower.  You may shave your face/neck. Please follow these instructions carefully:  1.  Shower with CHG Soap the night before surgery and the  morning of Surgery.  2.  If you choose to wash your hair, wash your hair first as usual with your  normal  shampoo.  3.  After you shampoo, rinse your hair and body thoroughly to remove the  shampoo.                           4.  Use CHG as you would any other liquid soap.  You  can apply chg directly  to the skin and wash                       Gently with a scrungie or clean washcloth.  5.  Apply the CHG Soap to your body ONLY FROM THE NECK DOWN.   Do not use on face/ open                           Wound or open sores. Avoid contact with eyes, ears mouth and genitals (private parts).                       Wash face,  Genitals (private parts) with your normal soap.             6.  Wash thoroughly, paying special attention to the area where your surgery  will be performed.  7.  Thoroughly rinse your body with warm water from the neck down.  8.  DO NOT shower/wash with your normal soap after using and rinsing off  the CHG Soap.                9.  Pat yourself dry with a clean towel.            10.  Wear clean pajamas.            11.  Place clean sheets on your bed the night of your first shower and do not  sleep with pets. Day of Surgery : Do not apply any lotions/deodorants the morning of surgery.  Please wear clean clothes to the hospital/surgery center.  FAILURE TO FOLLOW THESE INSTRUCTIONS MAY RESULT IN THE CANCELLATION OF YOUR SURGERY PATIENT SIGNATURE_________________________________  NURSE  SIGNATURE__________________________________  ________________________________________________________________________  WHAT IS A BLOOD TRANSFUSION? Blood Transfusion Information  A transfusion is the replacement of blood or some of its parts. Blood is made up of multiple cells which provide different functions.  Red blood cells carry oxygen and are used for blood loss replacement.  White blood cells fight against infection.  Platelets control bleeding.  Plasma helps clot blood.  Other blood products are available for specialized needs, such as hemophilia or other clotting disorders. BEFORE THE TRANSFUSION  Who gives blood for transfusions?   Healthy volunteers who are fully evaluated to make sure their blood is safe. This is blood bank blood. Transfusion therapy is the safest it has ever been in the practice of medicine. Before blood is taken from a donor, a complete history is taken to make sure that person has no history of diseases nor engages in risky social behavior (examples are intravenous drug use or sexual activity with multiple partners). The donor's travel history is screened to minimize risk of transmitting infections, such as malaria. The donated blood is tested for signs of infectious diseases, such as HIV and hepatitis. The blood is then tested to be sure it is compatible with you in order to minimize the chance of a transfusion reaction. If you or a relative donates blood, this is often done in anticipation of surgery and is not appropriate for emergency situations. It takes many days to process the donated blood. RISKS AND COMPLICATIONS Although transfusion therapy is very safe and saves many lives, the main dangers of transfusion include:   Getting an infectious disease.  Developing a transfusion reaction. This is an allergic reaction to something in the blood you were given. Every  precaution is taken to prevent this. The decision to have a blood transfusion has been  considered carefully by your caregiver before blood is given. Blood is not given unless the benefits outweigh the risks. AFTER THE TRANSFUSION  Right after receiving a blood transfusion, you will usually feel much better and more energetic. This is especially true if your red blood cells have gotten low (anemic). The transfusion raises the level of the red blood cells which carry oxygen, and this usually causes an energy increase.  The nurse administering the transfusion will monitor you carefully for complications. HOME CARE INSTRUCTIONS  No special instructions are needed after a transfusion. You may find your energy is better. Speak with your caregiver about any limitations on activity for underlying diseases you may have. SEEK MEDICAL CARE IF:   Your condition is not improving after your transfusion.  You develop redness or irritation at the intravenous (IV) site. SEEK IMMEDIATE MEDICAL CARE IF:  Any of the following symptoms occur over the next 12 hours:  Shaking chills.  You have a temperature by mouth above 102 F (38.9 C), not controlled by medicine.  Chest, back, or muscle pain.  People around you feel you are not acting correctly or are confused.  Shortness of breath or difficulty breathing.  Dizziness and fainting.  You get a rash or develop hives.  You have a decrease in urine output.  Your urine turns a dark color or changes to pink, red, or brown. Any of the following symptoms occur over the next 10 days:  You have a temperature by mouth above 102 F (38.9 C), not controlled by medicine.  Shortness of breath.  Weakness after normal activity.  The white part of the eye turns yellow (jaundice).  You have a decrease in the amount of urine or are urinating less often.  Your urine turns a dark color or changes to pink, red, or brown. Document Released: 01/31/2000 Document Revised: 04/27/2011 Document Reviewed: 09/19/2007 ExitCare Patient Information 2014  [place].  _______________________________________________________________________  Incentive Spirometer  An incentive spirometer is a tool that can help keep your lungs clear and active. This tool measures how well you are filling your lungs with each breath. Taking long deep breaths may help reverse or decrease the chance of developing breathing (pulmonary) problems (especially infection) following:  A long period of time when you are unable to move or be active. BEFORE THE PROCEDURE   If the spirometer includes an indicator to show your best effort, your nurse or respiratory therapist will set it to a desired goal.  If possible, sit up straight or lean slightly forward. Try not to slouch.  Hold the incentive spirometer in an upright position. INSTRUCTIONS FOR USE  1. Sit on the edge of your bed if possible, or sit up as far as you can in bed or on a chair. 2. Hold the incentive spirometer in an upright position. 3. Breathe out normally. 4. Place the mouthpiece in your mouth and seal your lips tightly around it. 5. Breathe in slowly and as deeply as possible, raising the piston or the ball toward the top of the column. 6. Hold your breath for 3-5 seconds or for as long as possible. Allow the piston or ball to fall to the bottom of the column. 7. Remove the mouthpiece from your mouth and breathe out normally. 8. Rest for a few seconds and repeat Steps 1 through 7 at least 10 times every 1-2 hours when you are awake.  Take your time and take a few normal breaths between deep breaths. 9. The spirometer may include an indicator to show your best effort. Use the indicator as a goal to work toward during each repetition. 10. After each set of 10 deep breaths, practice coughing to be sure your lungs are clear. If you have an incision (the cut made at the time of surgery), support your incision when coughing by placing a pillow or rolled up towels firmly against it. Once you are able to get  out of bed, walk around indoors and cough well. You may stop using the incentive spirometer when instructed by your caregiver.  RISKS AND COMPLICATIONS  Take your time so you do not get dizzy or light-headed.  If you are in pain, you may need to take or ask for pain medication before doing incentive spirometry. It is harder to take a deep breath if you are having pain. AFTER USE  Rest and breathe slowly and easily.  It can be helpful to keep track of a log of your progress. Your caregiver can provide you with a simple table to help with this. If you are using the spirometer at home, follow these instructions: [place] IF:   You are having difficultly using the spirometer.  You have trouble using the spirometer as often as instructed.  Your pain medication is not giving enough relief while using the spirometer.  You develop fever of 100.5 F (38.1 C) or higher. SEEK IMMEDIATE MEDICAL CARE IF:   You cough up bloody sputum that had not been present before.  You develop fever of 102 F (38.9 C) or greater.  You develop worsening pain at or near the incision site. MAKE SURE YOU:   Understand these instructions.  Will watch your condition.  Will get help right away if you are not doing well or get worse. Document Released: 06/15/2006 Document Revised: 04/27/2011 Document Reviewed: 08/16/2006 [facility] Patient Information 2014 [place], [place].   ________________________________________________________________________

## 2018-02-02 ENCOUNTER — Encounter (HOSPITAL_COMMUNITY): Payer: Self-pay

## 2018-02-02 ENCOUNTER — Other Ambulatory Visit: Payer: Self-pay

## 2018-02-02 ENCOUNTER — Encounter (HOSPITAL_COMMUNITY)
Admission: RE | Admit: 2018-02-02 | Discharge: 2018-02-02 | Disposition: A | Discharge status: Home / Self Care | Payer: Medicare PPO | Source: Ambulatory Visit | Attending: Obstetrics | Admitting: Obstetrics

## 2018-02-02 ENCOUNTER — Ambulatory Visit (HOSPITAL_COMMUNITY)
Admission: RE | Admit: 2018-02-02 | Discharge: 2018-02-02 | Disposition: A | Discharge status: Home / Self Care | Payer: Medicare PPO | Source: Ambulatory Visit | Attending: Gynecologic Oncology | Admitting: Gynecologic Oncology

## 2018-02-02 DIAGNOSIS — C541 Malignant neoplasm of endometrium: Secondary | ICD-10-CM | POA: Insufficient documentation

## 2018-02-02 DIAGNOSIS — Z01818 Encounter for other preprocedural examination: Secondary | ICD-10-CM | POA: Diagnosis not present

## 2018-02-02 HISTORY — DX: Unspecified osteoarthritis, unspecified site: M19.90

## 2018-02-02 LAB — CBC
HEMATOCRIT: 48.5 % — AB (ref 36.0–46.0)
Hemoglobin: 15.6 g/dL — ABNORMAL HIGH (ref 12.0–15.0)
MCH: 28.6 pg (ref 26.0–34.0)
MCHC: 32.2 g/dL (ref 30.0–36.0)
MCV: 89 fL (ref 80.0–100.0)
NRBC: 0 % (ref 0.0–0.2)
PLATELETS: 140 10*3/uL — AB (ref 150–400)
RBC: 5.45 MIL/uL — AB (ref 3.87–5.11)
RDW: 13.2 % (ref 11.5–15.5)
WBC: 7 10*3/uL (ref 4.0–10.5)

## 2018-02-02 LAB — COMPREHENSIVE METABOLIC PANEL
ALT: 15 U/L (ref 0–44)
AST: 16 U/L (ref 15–41)
Albumin: 3.9 g/dL (ref 3.5–5.0)
Alkaline Phosphatase: 86 U/L (ref 38–126)
Anion gap: 10 (ref 5–15)
BUN: 13 mg/dL (ref 8–23)
CO2: 24 mmol/L (ref 22–32)
CREATININE: 0.78 mg/dL (ref 0.44–1.00)
Calcium: 9 mg/dL (ref 8.9–10.3)
Chloride: 102 mmol/L (ref 98–111)
GFR calc Af Amer: >60 mL/min (ref >60)
Glucose, Bld: 306 mg/dL — ABNORMAL HIGH (ref 70–99)
Potassium: 4.6 mmol/L (ref 3.5–5.1)
Sodium: 136 mmol/L (ref 135–145)
Total Bilirubin: 0.4 mg/dL (ref 0.3–1.2)
Total Protein: 7.2 g/dL (ref 6.5–8.1)

## 2018-02-02 LAB — URINALYSIS, MICROSCOPIC (REFLEX)

## 2018-02-02 LAB — URINALYSIS, ROUTINE W REFLEX MICROSCOPIC
Bilirubin Urine: NEGATIVE
Glucose, UA: 100 mg/dL — AB
Hgb urine dipstick: NEGATIVE
Ketones, ur: NEGATIVE mg/dL
Nitrite: POSITIVE — AB
Protein, ur: NEGATIVE mg/dL
Specific Gravity, Urine: 1.02 (ref 1.005–1.030)
pH: 6 (ref 5.0–8.0)

## 2018-02-02 LAB — ABO/RH: ABO/RH(D): A POS

## 2018-02-02 NOTE — Progress Notes (Signed)
EKG 01-03-18  epic hgba1c 8.1    01-03-18  epic

## 2018-02-03 ENCOUNTER — Telehealth: Payer: Self-pay

## 2018-02-03 DIAGNOSIS — N390 Urinary tract infection, site not specified: Secondary | ICD-10-CM

## 2018-02-03 MED ORDER — NITROFURANTOIN MONOHYD MACRO 100 MG PO CAPS: 100.0000 mg | ORAL_CAPSULE | Freq: Two times a day (BID) | ORAL | 0 refills | Status: DC

## 2018-02-03 NOTE — Telephone Encounter (Signed)
LM for Debra Miranda to call back to the office to discuss where to call in ATB for UTI seen on Urinalysis 02-02-18 at pre op testing. Pt needs Macrobid 100 mg bid x 5 days sent in.  ATB could change pending results of urine culture results. Surgery is 12 -24-19.

## 2018-02-03 NOTE — Telephone Encounter (Signed)
Told Ms Enriques the results of the urinalysis as noted below. Sent in ATB to [place]. Told Ms Bonaparte to p/u ATB today and get in 1 dose this evening. Pt verbalized understanding.

## 2018-02-03 NOTE — Progress Notes (Signed)
Final CXR dated 02-02-2018 in epic.

## 2018-02-05 LAB — URINE CULTURE: Culture: >=100000 — AB

## 2018-02-07 ENCOUNTER — Telehealth: Payer: Self-pay

## 2018-02-07 NOTE — Anesthesia Preprocedure Evaluation (Addendum)
Anesthesia Evaluation  Patient identified by MRN, date of birth, ID band Patient awake    Reviewed: Allergy & Precautions, NPO status , Patient's Chart, lab work & pertinent test results  Airway Mallampati: III  TM Distance: >3 FB Neck ROM: Full  Mouth opening: Limited Mouth Opening  Dental no notable dental hx. (+) Edentulous Upper, Dental Advisory Given   Pulmonary neg pulmonary ROS, Current Smoker,    Pulmonary exam normal breath sounds clear to auscultation       Cardiovascular negative cardio ROS Normal cardiovascular exam Rhythm:Regular Rate:Normal     Neuro/Psych PSYCHIATRIC DISORDERS Depression negative neurological ROS     GI/Hepatic negative GI ROS, Neg liver ROS,   Endo/Other  negative endocrine ROSdiabetes, Type 2, Oral Hypoglycemic Agents  Renal/GU negative Renal ROS  negative genitourinary   Musculoskeletal  (+) Arthritis ,   Abdominal   Peds  Hematology negative hematology ROS (+)   Anesthesia Other Findings Endometrial cancer  Reproductive/Obstetrics                            Anesthesia Physical Anesthesia Plan  ASA: III  Anesthesia Plan: General   Post-op Pain Management:    Induction: Intravenous  PONV Risk Score and Plan: 2 and Dexamethasone, Ondansetron and Midazolam  Airway Management Planned: Oral ETT  Additional Equipment:   Intra-op Plan:   Post-operative Plan: Extubation in OR  Informed Consent: I have reviewed the patients History and Physical, chart, labs and discussed the procedure including the risks, benefits and alternatives for the proposed anesthesia with the patient or authorized representative who has indicated his/her understanding and acceptance.   Dental advisory given  Plan Discussed with: CRNA  Anesthesia Plan Comments:        Anesthesia Quick Evaluation

## 2018-02-07 NOTE — Telephone Encounter (Addendum)
Outgoing call to patient per Joylene John NP - regarding urine culture results and Per Melissa NP "let patient know that the culture came back and previous prescription of Macrobid should treat infection."  No answer, left VM to call our office.    Pt returned call and above info given.  Per Lenna Sciara NP - pt can take tonight's dose of Macrobid but not tomorrow's due to surgery and pt can resume when she gets home. Pt voiced understanding. No other needs per pt at this time.

## 2018-02-08 ENCOUNTER — Encounter (HOSPITAL_COMMUNITY)
Admission: RE | Disposition: A | Discharge status: Home / Self Care | Payer: Self-pay | Source: Home / Self Care | Attending: Obstetrics

## 2018-02-08 ENCOUNTER — Other Ambulatory Visit: Payer: Self-pay

## 2018-02-08 ENCOUNTER — Ambulatory Visit (HOSPITAL_COMMUNITY): Payer: Medicare PPO | Admitting: Anesthesiology

## 2018-02-08 ENCOUNTER — Ambulatory Visit (HOSPITAL_COMMUNITY)
Admission: RE | Admit: 2018-02-08 | Discharge: 2018-02-09 | Disposition: A | Discharge status: Home / Self Care | Payer: Medicare PPO | Attending: Obstetrics | Admitting: Obstetrics

## 2018-02-08 ENCOUNTER — Encounter (HOSPITAL_COMMUNITY): Payer: Self-pay

## 2018-02-08 DIAGNOSIS — Z7984 Long term (current) use of oral hypoglycemic drugs: Secondary | ICD-10-CM | POA: Insufficient documentation

## 2018-02-08 DIAGNOSIS — N83202 Unspecified ovarian cyst, left side: Secondary | ICD-10-CM | POA: Insufficient documentation

## 2018-02-08 DIAGNOSIS — Z79899 Other long term (current) drug therapy: Secondary | ICD-10-CM | POA: Insufficient documentation

## 2018-02-08 DIAGNOSIS — R8569 Abnormal cytological findings in specimens from other digestive organs and abdominal cavity: Secondary | ICD-10-CM | POA: Diagnosis not present

## 2018-02-08 DIAGNOSIS — C55 Malignant neoplasm of uterus, part unspecified: Secondary | ICD-10-CM | POA: Diagnosis present

## 2018-02-08 DIAGNOSIS — M1990 Unspecified osteoarthritis, unspecified site: Secondary | ICD-10-CM | POA: Diagnosis not present

## 2018-02-08 DIAGNOSIS — K5900 Constipation, unspecified: Secondary | ICD-10-CM | POA: Diagnosis not present

## 2018-02-08 DIAGNOSIS — N800 Endometriosis of uterus: Secondary | ICD-10-CM | POA: Insufficient documentation

## 2018-02-08 DIAGNOSIS — Z23 Encounter for immunization: Secondary | ICD-10-CM | POA: Diagnosis not present

## 2018-02-08 DIAGNOSIS — C541 Malignant neoplasm of endometrium: Secondary | ICD-10-CM

## 2018-02-08 DIAGNOSIS — E119 Type 2 diabetes mellitus without complications: Secondary | ICD-10-CM | POA: Insufficient documentation

## 2018-02-08 DIAGNOSIS — F329 Major depressive disorder, single episode, unspecified: Secondary | ICD-10-CM | POA: Insufficient documentation

## 2018-02-08 DIAGNOSIS — IMO0001 Reserved for inherently not codable concepts without codable children: Secondary | ICD-10-CM

## 2018-02-08 DIAGNOSIS — Z794 Long term (current) use of insulin: Secondary | ICD-10-CM

## 2018-02-08 DIAGNOSIS — N83201 Unspecified ovarian cyst, right side: Secondary | ICD-10-CM | POA: Insufficient documentation

## 2018-02-08 HISTORY — PX: SENTINEL NODE BIOPSY: SHX6608

## 2018-02-08 HISTORY — PX: ROBOTIC ASSISTED TOTAL HYSTERECTOMY WITH BILATERAL SALPINGO OOPHERECTOMY: SHX6086

## 2018-02-08 LAB — CBC
HCT: 45.3 % (ref 36.0–46.0)
Hemoglobin: 14.5 g/dL (ref 12.0–15.0)
MCH: 28.7 pg (ref 26.0–34.0)
MCHC: 32 g/dL (ref 30.0–36.0)
MCV: 89.7 fL (ref 80.0–100.0)
NRBC: 0 % (ref 0.0–0.2)
Platelets: 120 10*3/uL — ABNORMAL LOW (ref 150–400)
RBC: 5.05 MIL/uL (ref 3.87–5.11)
RDW: 13.1 % (ref 11.5–15.5)
WBC: 7.4 10*3/uL (ref 4.0–10.5)

## 2018-02-08 LAB — GLUCOSE, CAPILLARY
GLUCOSE-CAPILLARY: 180 mg/dL — AB (ref 70–99)
Glucose-Capillary: 236 mg/dL — ABNORMAL HIGH (ref 70–99)
Glucose-Capillary: 266 mg/dL — ABNORMAL HIGH (ref 70–99)
Glucose-Capillary: 313 mg/dL — ABNORMAL HIGH (ref 70–99)

## 2018-02-08 LAB — CREATININE, SERUM: Creatinine, Ser: 0.64 mg/dL (ref 0.44–1.00)

## 2018-02-08 LAB — TYPE AND SCREEN
ABO/RH(D): A POS
Antibody Screen: NEGATIVE

## 2018-02-08 SURGERY — HYSTERECTOMY, TOTAL, ROBOT-ASSISTED, LAPAROSCOPIC, WITH BILATERAL SALPINGO-OOPHORECTOMY
Anesthesia: General | Laterality: Bilateral

## 2018-02-08 MED ORDER — INSULIN ASPART 100 UNIT/ML ~~LOC~~ SOLN
0.0000 [IU] | Freq: Three times a day (TID) | SUBCUTANEOUS | Status: DC
  Administered 2018-02-08: 5 [IU] via SUBCUTANEOUS
  Administered 2018-02-08: 8 [IU] via SUBCUTANEOUS
  Administered 2018-02-09: 3 [IU] via SUBCUTANEOUS

## 2018-02-08 MED ORDER — SUGAMMADEX SODIUM 200 MG/2ML IV SOLN
INTRAVENOUS | Status: DC | PRN
  Administered 2018-02-08: 200 mg via INTRAVENOUS

## 2018-02-08 MED ORDER — ROCURONIUM BROMIDE 10 MG/ML (PF) SYRINGE
PREFILLED_SYRINGE | INTRAVENOUS | Status: AC
  Filled 2018-02-08: qty 10

## 2018-02-08 MED ORDER — BUPIVACAINE-EPINEPHRINE (PF) 0.25% -1:200000 IJ SOLN
INTRAMUSCULAR | Status: AC
  Filled 2018-02-08: qty 30

## 2018-02-08 MED ORDER — TRAMADOL HCL 50 MG PO TABS: 100.0000 mg | ORAL_TABLET | Freq: Two times a day (BID) | ORAL | Status: DC | PRN

## 2018-02-08 MED ORDER — OXYCODONE HCL 5 MG PO TABS
5.0000 mg | ORAL_TABLET | ORAL | Status: DC | PRN
  Administered 2018-02-08 – 2018-02-09 (×2): 5 mg via ORAL
  Filled 2018-02-08 (×2): qty 1

## 2018-02-08 MED ORDER — INSULIN ASPART 100 UNIT/ML ~~LOC~~ SOLN
SUBCUTANEOUS | Status: AC
  Filled 2018-02-08: qty 1

## 2018-02-08 MED ORDER — ACETAMINOPHEN 500 MG PO TABS
1000.0000 mg | ORAL_TABLET | ORAL | Status: AC
  Administered 2018-02-08: 1000 mg via ORAL
  Filled 2018-02-08: qty 2

## 2018-02-08 MED ORDER — INSULIN GLARGINE 100 UNIT/ML ~~LOC~~ SOLN
16.0000 [IU] | Freq: Every day | SUBCUTANEOUS | Status: DC
  Administered 2018-02-08: 16 [IU] via SUBCUTANEOUS
  Filled 2018-02-08: qty 0.16

## 2018-02-08 MED ORDER — DEXAMETHASONE SODIUM PHOSPHATE 10 MG/ML IJ SOLN
INTRAMUSCULAR | Status: AC
  Filled 2018-02-08: qty 1

## 2018-02-08 MED ORDER — PROPOFOL 10 MG/ML IV BOLUS
INTRAVENOUS | Status: AC
  Filled 2018-02-08: qty 20

## 2018-02-08 MED ORDER — MIDAZOLAM HCL 5 MG/5ML IJ SOLN
INTRAMUSCULAR | Status: DC | PRN
  Administered 2018-02-08: 2 mg via INTRAVENOUS

## 2018-02-08 MED ORDER — LIDOCAINE 2% (20 MG/ML) 5 ML SYRINGE
INTRAMUSCULAR | Status: DC | PRN
  Administered 2018-02-08: 80 mg via INTRAVENOUS

## 2018-02-08 MED ORDER — ACETAMINOPHEN 500 MG PO TABS
1000.0000 mg | ORAL_TABLET | Freq: Two times a day (BID) | ORAL | Status: DC
  Administered 2018-02-08 – 2018-02-09 (×2): 1000 mg via ORAL
  Filled 2018-02-08 (×2): qty 2

## 2018-02-08 MED ORDER — FENTANYL CITRATE (PF) 100 MCG/2ML IJ SOLN: 25.0000 ug | INTRAMUSCULAR | Status: DC | PRN

## 2018-02-08 MED ORDER — ONDANSETRON HCL 4 MG PO TABS: 4.0000 mg | ORAL_TABLET | Freq: Four times a day (QID) | ORAL | Status: DC | PRN

## 2018-02-08 MED ORDER — LEVALBUTEROL HCL 1.25 MG/0.5ML IN NEBU: 1.2500 mg | INHALATION_SOLUTION | Freq: Three times a day (TID) | RESPIRATORY_TRACT | Status: DC

## 2018-02-08 MED ORDER — INDOCYANINE GREEN 25 MG IV SOLR
INTRAVENOUS | Status: DC | PRN
  Administered 2018-02-08: 2.5 mg

## 2018-02-08 MED ORDER — LACTATED RINGERS IV SOLN
INTRAVENOUS | Status: DC
  Administered 2018-02-08: 11:00:00 via INTRAVENOUS
  Administered 2018-02-08: 1000 mL via INTRAVENOUS

## 2018-02-08 MED ORDER — ONDANSETRON HCL 4 MG/2ML IJ SOLN
INTRAMUSCULAR | Status: AC
  Filled 2018-02-08: qty 2

## 2018-02-08 MED ORDER — PNEUMOCOCCAL VAC POLYVALENT 25 MCG/0.5ML IJ INJ
0.5000 mL | INJECTION | INTRAMUSCULAR | Status: DC
  Filled 2018-02-08: qty 0.5

## 2018-02-08 MED ORDER — LIDOCAINE 2% (20 MG/ML) 5 ML SYRINGE
INTRAMUSCULAR | Status: AC
  Filled 2018-02-08: qty 5

## 2018-02-08 MED ORDER — SODIUM CHLORIDE 0.9 % IV SOLN
INTRAVENOUS | Status: DC | PRN
  Administered 2018-02-08: 25 ug/min via INTRAVENOUS

## 2018-02-08 MED ORDER — ONDANSETRON HCL 4 MG/2ML IJ SOLN
INTRAMUSCULAR | Status: DC | PRN
  Administered 2018-02-08: 4 mg via INTRAVENOUS

## 2018-02-08 MED ORDER — ROCURONIUM BROMIDE 10 MG/ML (PF) SYRINGE
PREFILLED_SYRINGE | INTRAVENOUS | Status: DC | PRN
  Administered 2018-02-08: 50 mg via INTRAVENOUS
  Administered 2018-02-08: 20 mg via INTRAVENOUS

## 2018-02-08 MED ORDER — KCL IN DEXTROSE-NACL 20-5-0.45 MEQ/L-%-% IV SOLN
INTRAVENOUS | Status: DC
  Administered 2018-02-08: 13:00:00 via INTRAVENOUS
  Filled 2018-02-08 (×2): qty 1000

## 2018-02-08 MED ORDER — SODIUM CHLORIDE 0.9 % IR SOLN
Status: DC | PRN
  Administered 2018-02-08: 1000 mL

## 2018-02-08 MED ORDER — LEVALBUTEROL HCL 1.25 MG/0.5ML IN NEBU
INHALATION_SOLUTION | RESPIRATORY_TRACT | Status: AC
  Filled 2018-02-08: qty 0.5

## 2018-02-08 MED ORDER — ENOXAPARIN SODIUM 40 MG/0.4ML ~~LOC~~ SOLN
40.0000 mg | SUBCUTANEOUS | Status: DC
  Administered 2018-02-09: 40 mg via SUBCUTANEOUS
  Filled 2018-02-08: qty 0.4

## 2018-02-08 MED ORDER — IBUPROFEN 200 MG PO TABS: 600.0000 mg | ORAL_TABLET | Freq: Four times a day (QID) | ORAL | Status: DC

## 2018-02-08 MED ORDER — STERILE WATER FOR INJECTION IJ SOLN
INTRAMUSCULAR | Status: AC
  Filled 2018-02-08: qty 10

## 2018-02-08 MED ORDER — DEXAMETHASONE SODIUM PHOSPHATE 10 MG/ML IJ SOLN
INTRAMUSCULAR | Status: DC | PRN
  Administered 2018-02-08: 10 mg via INTRAVENOUS

## 2018-02-08 MED ORDER — PHENYLEPHRINE 40 MCG/ML (10ML) SYRINGE FOR IV PUSH (FOR BLOOD PRESSURE SUPPORT)
PREFILLED_SYRINGE | INTRAVENOUS | Status: DC | PRN
  Administered 2018-02-08: 80 ug via INTRAVENOUS

## 2018-02-08 MED ORDER — LEVALBUTEROL HCL 1.25 MG/0.5ML IN NEBU
1.2500 mg | INHALATION_SOLUTION | Freq: Once | RESPIRATORY_TRACT | Status: AC
  Administered 2018-02-08: 1.25 mg via RESPIRATORY_TRACT

## 2018-02-08 MED ORDER — ESCITALOPRAM OXALATE 10 MG PO TABS
10.0000 mg | ORAL_TABLET | Freq: Every day | ORAL | Status: DC
  Administered 2018-02-09: 10 mg via ORAL
  Filled 2018-02-08: qty 1

## 2018-02-08 MED ORDER — LIDOCAINE 20MG/ML (2%) 15 ML SYRINGE OPTIME
INTRAMUSCULAR | Status: DC | PRN
  Administered 2018-02-08: 1.5 mg/kg/h via INTRAVENOUS

## 2018-02-08 MED ORDER — DOCUSATE SODIUM 100 MG PO CAPS
100.0000 mg | ORAL_CAPSULE | Freq: Two times a day (BID) | ORAL | Status: DC
  Administered 2018-02-08 – 2018-02-09 (×2): 100 mg via ORAL
  Filled 2018-02-08 (×2): qty 1

## 2018-02-08 MED ORDER — GABAPENTIN 300 MG PO CAPS
300.0000 mg | ORAL_CAPSULE | ORAL | Status: AC
  Administered 2018-02-08: 300 mg via ORAL
  Filled 2018-02-08: qty 1

## 2018-02-08 MED ORDER — PROPOFOL 10 MG/ML IV BOLUS
INTRAVENOUS | Status: DC | PRN
  Administered 2018-02-08: 70 mg via INTRAVENOUS

## 2018-02-08 MED ORDER — SCOPOLAMINE 1 MG/3DAYS TD PT72
1.0000 | MEDICATED_PATCH | TRANSDERMAL | Status: DC
  Administered 2018-02-08: 1.5 mg via TRANSDERMAL
  Filled 2018-02-08: qty 1

## 2018-02-08 MED ORDER — DEXAMETHASONE SODIUM PHOSPHATE 4 MG/ML IJ SOLN: 4.0000 mg | INTRAMUSCULAR | Status: DC

## 2018-02-08 MED ORDER — CEFAZOLIN SODIUM-DEXTROSE 2-4 GM/100ML-% IV SOLN
2.0000 g | INTRAVENOUS | Status: AC
  Administered 2018-02-08: 2 g via INTRAVENOUS
  Filled 2018-02-08: qty 100

## 2018-02-08 MED ORDER — FENTANYL CITRATE (PF) 250 MCG/5ML IJ SOLN
INTRAMUSCULAR | Status: DC | PRN
  Administered 2018-02-08: 100 ug via INTRAVENOUS
  Administered 2018-02-08 (×2): 50 ug via INTRAVENOUS

## 2018-02-08 MED ORDER — FENTANYL CITRATE (PF) 250 MCG/5ML IJ SOLN
INTRAMUSCULAR | Status: AC
  Filled 2018-02-08: qty 5

## 2018-02-08 MED ORDER — ONDANSETRON HCL 4 MG/2ML IJ SOLN: 4.0000 mg | Freq: Four times a day (QID) | INTRAMUSCULAR | Status: DC | PRN

## 2018-02-08 MED ORDER — STERILE WATER FOR INJECTION IJ SOLN
INTRAMUSCULAR | Status: DC | PRN
  Administered 2018-02-08: 9 mL

## 2018-02-08 MED ORDER — MIDAZOLAM HCL 2 MG/2ML IJ SOLN
INTRAMUSCULAR | Status: AC
  Filled 2018-02-08: qty 2

## 2018-02-08 MED ORDER — ATORVASTATIN CALCIUM 20 MG PO TABS
20.0000 mg | ORAL_TABLET | Freq: Every day | ORAL | Status: DC
  Administered 2018-02-09: 20 mg via ORAL
  Filled 2018-02-08: qty 1

## 2018-02-08 MED ORDER — HYDROMORPHONE HCL 1 MG/ML IJ SOLN: 0.5000 mg | INTRAMUSCULAR | Status: DC | PRN

## 2018-02-08 SURGICAL SUPPLY — 79 items
ADH SKN CLS APL DERMABOND .7 (GAUZE/BANDAGES/DRESSINGS) ×1
AGENT HMST KT MTR STRL THRMB (HEMOSTASIS)
APL ESCP 34 STRL LF DISP (HEMOSTASIS)
APL SWBSTK 6 STRL LF DISP (MISCELLANEOUS)
APPLICATOR COTTON TIP 6 STRL (MISCELLANEOUS) ×1 IMPLANT
APPLICATOR COTTON TIP 6IN STRL (MISCELLANEOUS)
APPLICATOR SURGIFLO ENDO (HEMOSTASIS) IMPLANT
BAG LAPAROSCOPIC 12 15 PORT 16 (BASKET) IMPLANT
BAG RETRIEVAL 12/15 (BASKET)
BAG SPEC RTRVL LRG 6X4 10 (ENDOMECHANICALS) ×1
COVER BACK TABLE 60X90IN (DRAPES) ×2 IMPLANT
COVER TIP SHEARS 8 DVNC (MISCELLANEOUS) ×1 IMPLANT
COVER TIP SHEARS 8MM DA VINCI (MISCELLANEOUS) ×1
COVER WAND RF STERILE (DRAPES) IMPLANT
DERMABOND ADVANCED (GAUZE/BANDAGES/DRESSINGS) ×1
DERMABOND ADVANCED .7 DNX12 (GAUZE/BANDAGES/DRESSINGS) ×1 IMPLANT
DRAPE ARM DVNC X/XI (DISPOSABLE) ×4 IMPLANT
DRAPE COLUMN DVNC XI (DISPOSABLE) ×1 IMPLANT
DRAPE DA VINCI XI ARM (DISPOSABLE) ×4
DRAPE DA VINCI XI COLUMN (DISPOSABLE) ×1
DRAPE SHEET LG 3/4 BI-LAMINATE (DRAPES) ×2 IMPLANT
DRAPE SURG IRRIG POUCH 19X23 (DRAPES) ×2 IMPLANT
DRAPE UNDERBUTTOCKS STRL (DRAPE) ×2 IMPLANT
ELECT REM PT RETURN 15FT ADLT (MISCELLANEOUS) ×2 IMPLANT
GLOVE BIO SURGEON STRL SZ 6 (GLOVE) IMPLANT
GLOVE BIO SURGEON STRL SZ 6.5 (GLOVE) IMPLANT
GLOVE BIOGEL PI IND STRL 7.0 (GLOVE) ×3 IMPLANT
GLOVE BIOGEL PI INDICATOR 7.0 (GLOVE) ×4
GLOVE SURG SS PI 6.5 STRL IVOR (GLOVE) ×6 IMPLANT
GLOVE SURG SS PI 7.0 STRL IVOR (GLOVE) ×1 IMPLANT
GOWN STRL REUS W/ TWL LRG LVL3 (GOWN DISPOSABLE) ×1 IMPLANT
GOWN STRL REUS W/TWL LRG LVL3 (GOWN DISPOSABLE) ×2
GOWN STRL REUS W/TWL XL LVL3 (GOWN DISPOSABLE) ×4 IMPLANT
GYRUS RUMI II 2.5CM BLUE (DISPOSABLE)
GYRUS RUMI II 3.5CM BLUE (DISPOSABLE)
HOLDER FOLEY CATH W/STRAP (MISCELLANEOUS) ×2 IMPLANT
IRRIG SUCT STRYKERFLOW 2 WTIP (MISCELLANEOUS) ×2
IRRIGATION SUCT STRKRFLW 2 WTP (MISCELLANEOUS) ×1 IMPLANT
KIT PROCEDURE DA VINCI SI (MISCELLANEOUS) ×1
KIT PROCEDURE DVNC SI (MISCELLANEOUS) IMPLANT
MANIPULATOR UTERINE 4.5 ZUMI (MISCELLANEOUS) IMPLANT
NDL HYPO 25X1 1.5 SAFETY (NEEDLE) ×1 IMPLANT
NDL SPNL 18GX3.5 QUINCKE PK (NEEDLE) IMPLANT
NEEDLE HYPO 25X1 1.5 SAFETY (NEEDLE) ×2 IMPLANT
NEEDLE SPNL 18GX3.5 QUINCKE PK (NEEDLE) ×2 IMPLANT
OBTURATOR OPTICAL STANDARD 8MM (TROCAR) ×1
OBTURATOR OPTICAL STND 8 DVNC (TROCAR) ×1
OBTURATOR OPTICALSTD 8 DVNC (TROCAR) ×1 IMPLANT
PACK ROBOT GYN CUSTOM WL (TRAY / TRAY PROCEDURE) ×2 IMPLANT
PAD POSITIONING PINK XL (MISCELLANEOUS) ×2 IMPLANT
PORT ACCESS TROCAR AIRSEAL 12 (TROCAR) ×1 IMPLANT
PORT ACCESS TROCAR AIRSEAL 5M (TROCAR) ×1
POUCH SPECIMEN RETRIEVAL 10MM (ENDOMECHANICALS) ×1 IMPLANT
RUMI II 3.0CM BLUE KOH-EFFICIE (DISPOSABLE) ×1 IMPLANT
RUMI II GYRUS 2.5CM BLUE (DISPOSABLE) IMPLANT
RUMI II GYRUS 3.5CM BLUE (DISPOSABLE) IMPLANT
SCISSORS LAP 5X45 EPIX DISP (ENDOMECHANICALS) ×1 IMPLANT
SEAL CANN UNIV 5-8 DVNC XI (MISCELLANEOUS) ×4 IMPLANT
SEAL XI 5MM-8MM UNIVERSAL (MISCELLANEOUS) ×4
SET TRI-LUMEN FLTR TB AIRSEAL (TUBING) ×2 IMPLANT
SURGIFLO W/THROMBIN 8M KIT (HEMOSTASIS) IMPLANT
SUT VIC AB 0 CT1 27 (SUTURE)
SUT VIC AB 0 CT1 27XBRD ANTBC (SUTURE) IMPLANT
SUT VIC AB 4-0 PS2 27 (SUTURE) ×4 IMPLANT
SUT VLOC 180 0 9IN  GS21 (SUTURE) ×1
SUT VLOC 180 0 9IN GS21 (SUTURE) ×1 IMPLANT
SYR 10ML LL (SYRINGE) IMPLANT
TIP RUMI ORANGE 6.7MMX12CM (TIP) IMPLANT
TIP UTERINE 5.1X6CM LAV DISP (MISCELLANEOUS) IMPLANT
TIP UTERINE 6.7X10CM GRN DISP (MISCELLANEOUS) IMPLANT
TIP UTERINE 6.7X6CM WHT DISP (MISCELLANEOUS) IMPLANT
TIP UTERINE 6.7X8CM BLUE DISP (MISCELLANEOUS) ×1 IMPLANT
TOWEL OR NON WOVEN STRL DISP B (DISPOSABLE) ×2 IMPLANT
TRAP SPECIMEN MUCOUS 40CC (MISCELLANEOUS) ×1 IMPLANT
TRAY FOL W/BAG SLVR 16FR STRL (SET/KITS/TRAYS/PACK) IMPLANT
TRAY FOLEY MTR SLVR 16FR STAT (SET/KITS/TRAYS/PACK) ×1 IMPLANT
TRAY FOLEY W/BAG SLVR 16FR LF (SET/KITS/TRAYS/PACK) ×2
UNDERPAD 30X30 (UNDERPADS AND DIAPERS) ×2 IMPLANT
WATER STERILE IRR 1000ML POUR (IV SOLUTION) ×2 IMPLANT

## 2018-02-08 NOTE — Anesthesia Procedure Notes (Signed)
Procedure Name: Intubation Date/Time: 02/08/2018 7:31 AM Performed by: Mitzie Na, CRNA Pre-anesthesia Checklist: Patient identified, Emergency Drugs available, Suction available, Patient being monitored and Timeout performed Patient Re-evaluated:Patient Re-evaluated prior to induction Oxygen Delivery Method: Circle system utilized Preoxygenation: Pre-oxygenation with 100% oxygen Induction Type: IV induction Ventilation: Mask ventilation without difficulty Laryngoscope Size: Mac and 3 Grade View: Grade I Tube type: Oral Tube size: 7.0 mm Number of attempts: 1 Airway Equipment and Method: Stylet Placement Confirmation: ETT inserted through vocal cords under direct vision,  positive ETCO2 and breath sounds checked- equal and bilateral Secured at: 21 cm Tube secured with: Tape Dental Injury: Teeth and Oropharynx as per pre-operative assessment

## 2018-02-08 NOTE — Anesthesia Postprocedure Evaluation (Signed)
Anesthesia Post Note  Patient: Debra Miranda  Procedure(s) Performed: XI ROBOTIC ASSISTED TOTAL HYSTERECTOMY WITH BILATERAL SALPINGO OOPHORECTOMY (Bilateral ) SENTINEL NODE BIOPSY AND PELVIC LYMPH NODE SAMPLING (Bilateral )     Patient location during evaluation: PACU Anesthesia Type: General Level of consciousness: awake and alert Pain management: pain level controlled Vital Signs Assessment: post-procedure vital signs reviewed and stable Respiratory status: spontaneous breathing, nonlabored ventilation, respiratory function stable and patient connected to nasal cannula oxygen Cardiovascular status: blood pressure returned to baseline and stable Postop Assessment: no apparent nausea or vomiting Anesthetic complications: no    Last Vitals:  Vitals:   02/08/18 1145 02/08/18 1200  BP: (!) 144/69 (!) 147/79  Pulse: 81 81  Resp: (!) 23 (!) 21  Temp:  (!) 36.3 C  SpO2: (!) 88% (!) 88%    Last Pain:  Vitals:   02/08/18 1200  TempSrc:   PainSc: 0-No pain                 [name] [name]

## 2018-02-08 NOTE — Op Note (Signed)
OPERATIVE NOTE   Surgeon: Mart Piggs, MD  Assistants: Lahoma Crocker, MD (an MD assistant was necessary for tissue manipulation, management of robotic instrumentation, retraction and positioning due to the complexity of the case and hospital policies).   Pre-operative Diagnosis: Endometrial cancer grade 2  Post-operative Diagnosis: same, pending final pathology  Operation:  1. Robotic-assisted laparoscopic total hysterectomy with bilateral salpingoophorectomy 2. SLN biopsy 3. Pelvic washings 4. Pelvic lymph node sampling  Anesthesia: General endotracheal anesthesia   Findings: Omentum herniated through umbilicus. No extrauterine disease. 2 Sentinel nodes found on right obturator taken together, left external iliac.  Estimated Blood Loss:  less than 50 mL             Specimens: Washings, uterus, cervix, bilateral tubes and bilateral ovaries, right obturator (suspect 2) and left external sentinel lymph nodes, right pelvic lymph nodes (non-sentinel)         Complications:  None; patient tolerated the procedure well.         Disposition: PACU - hemodynamically stable.  Procedure Details  After induction of anesthesia the patient was placed in lithotomy position. Her arms were tucked to her side with all appropriate precautions.  The shoulders were stabilized with padded shoulder blocks applied to the acromium processes.  The perineum was prepped with Betadine. CholoraPrep was used to prep the abdomen and allowed to dry for 3 minutes.  The patient was then draped.   A time out was performed. A Foley catheter was placed by me.  A sterile speculum was placed in the vagina.  The cervix was grasped with a single-tooth tenaculum. 2mg  total of ICG was injected into the cervical stroma at 3 and 9 o'clock with 1cc injected at a 1cm and 82mm depth (concentration 0.5mg /ml) in all locations. The uterus sounded to 8cm. The cervix was dilated with Kennon Rounds dilators.  The RUMI II uterine  manipulator with a 3.0 small colpotomizer ring was placed without difficulty.  OG tube placement was confirmed.   Next, a 10 mm skin incision was made 2 cm below the subcostal margin just medial to the midclavicular line.  The 5 mm Optiview port and scope was used for direct entry.  Opening pressure was under 10 mm CO2.  The abdomen was insufflated and the findings were noted as above.   At this point and all points during the procedure, the patient's intra-abdominal pressure did not exceed 15 mmHg. The patient was placed in steep Trendelenburg.  Next, an 38mm skin incision was made on the left lateral side of the umbilicus and a left port was placed about 6-8 cm lateral to this, then another 50mm robot port was placed on the right side about 6-8 cm lateral to the midline. A fourth arm was placed on the right lateral abdomen, 6-8cm from the last. The 72mm Optiview was changed out for a 40mm Airseal. All ports were placed under direct visualization.  Bowel was folded away into the upper abdomen.  The robot was docked in the normal manner.  The round ligaments were elevated, coagulated, and transected. The right and left peritoneum were opened parallel to the IP ligament to open the retroperitoneal spaces bilaterally. The SLN mapping was performed in bilateral pelvic basins. The para rectal and paravesical spaces were opened up entirely with careful dissection below the level of the ureters bilaterally and to the depth of the uterine artery origin in order to skeletonize the uterine "web" and ensure visualization of all parametrial channels. The para-aortic basins  were carefully exposed and evaluated for isolated para-aortic SLN's. Lymphatic channels were identified travelling to the sentinel lymph nodes as noted in the findings. These SLN's were separated from their surrounding lymphatic tissue, removed and sent for permanent pathology.  The hysterectomy was initiated. The ureter was noted to be on the medial leaf  of the broad ligament.  The peritoneum above the ureter was incised and stretched and the left infundibulopelvic ligament was skeletonized, cauterized and transected.  The posterior peritoneum was taken down to the level of the KOH ring.  The anterior peritoneum was also taken down.  The bladder flap was created to the level of the KOH ring.  The uterine artery on the left was skeletonized, cauterized, and transected in the normal manner.  A similar procedure was performed on the contralateral side. The colpotomy was made and the uterus, cervix, bilateral ovaries and tubes were amputated and delivered through the vagina.  Pedicles were inspected and excellent hemostasis was achieved.    The uterus was sent for frozen section.  While awaiting frozen section I performed pelvic lymphadenectomy on the patient's right. The paravesical spaces and pararectal spaces had been previously developed. These spaces were held open with tension on the median umbilical ligament with the fourth arm and assistant on the broad ligament. The paravesical space was opened with blunt and sharp dissection to mobilize the ureter off of the medial surface of the internal iliac artery. The medial leaf of the broad ligament containing the ureter was held medially (opening the pararectal space) by the assistant's grasper. The  pelvic lymphadenectomy was performed by skeletonizing the internal iliac artery at the bifurcation with the external iliac artery. The obturator nerve was identified in the base of lateral paravesical space. The ureter was mobilized medially off of the dissection by developing the pararectal space. The genitofemoral nerve was identified, skeletonized and mobilized laterally off of the external iliac artery. An enbloc resection of lymph nodes was performed within the following boundaries: the mid portion of the common iliac proximally, the circumflex iliac vein distally, the obturator nerve posteriorally, the  genitofemoral nerve laterally. The nodal basin (including obturator space) were confirmed to be empty of nodes and hemostatic. The nodes were placed in an endocatch bag and retrieved vaginally.   The colpotomy at the vaginal cuff was closed with running one 9-inch V-loc sutures, starting at the right side and traveling back for another 2 bites. The suture was cut and the needles removed through the 31mm airseal trocar. Irrigation was used and excellent hemostasis was achieved.    Frozen section returned with no malignancy seen, likely confined to the polyp from [facility].   Robotic instruments were removed under direct visualization.  The robot was undocked. The 75mm port fascia was closed with a 0-vicryl in the usual fashion. The Airseal was shut down. The mmHg noted to go to 0. The trocars were removed. The skin was closed with 4-0 Vicryl in the dermis. Then 4-0 monocryl was used in a subcuticular manner.  Dermabond was applied.    The balloon was removed from the vagina. Sponge, lap and needle counts correct x 2.  The patient was taken to the recovery room in stable condition.

## 2018-02-08 NOTE — Discharge Summary (Signed)
Physician Discharge Summary  Patient ID: Debra Miranda MRN: [id number] DOB/AGE: 70-Jul-1947 70 y.o.  Admit date: 02/08/2018 Discharge date: 02/09/2018  Admission Diagnoses: <principal problem not specified>  Discharge Diagnoses:  Active Problems:   Endometrial adenocarcinoma ([place])   Uterine cancer [facility])   Discharged Condition: good  Hospital Course:  1/ patient was admitted on Robotic Hyst/BSO/Sentinel lymph nodes for uterine cancer 2/ surgery was uncomplicated  3/ on postoperative day 1 the patient was meeting discharge criteria: tolerating PO, voiding urine, ambulating, pain well controlled on oral medications,  4/ new medications on discharge include Norco.   Consults: None    Treatments: surgery: Hyst/BSO/Staging  Discharge Exam: Blood pressure 123/60, pulse (!) 51, temperature 97.6 F (36.4 C), temperature source Oral, resp. rate 18, height 5\' 7"  (1.702 m), weight 190 lb (86.2 kg), SpO2 93 %. General appearance: alert and no distress Resp: clear to auscultation bilaterally Cardio: regular rate and rhythm and   GI: soft, non-tender; bowel sounds normal; no masses,  no organomegaly Extremities: no edema  Disposition: Discharge disposition: 01-Home or Self Care       Discharge Instructions    Diet - low sodium heart healthy   Complete by:  As directed    Increase activity slowly   Complete by:  As directed      Allergies as of 02/09/2018      Reactions   Latex Itching      Medication List    STOP taking these medications   Cinnamon 500 MG capsule   Fish Oil 1000 MG Caps     TAKE these medications   atorvastatin 20 MG tablet Commonly known as:  LIPITOR Take 1 tablet (20 mg total) by mouth daily. What changed:  when to take this   CALCIUM 1200 PO Take 1 tablet by mouth at bedtime.   escitalopram 10 MG tablet Commonly known as:  LEXAPRO Take 1 tablet (10 mg total) by mouth daily. What changed:  when to take this    HYDROcodone-acetaminophen 5-325 MG tablet Commonly known as:  NORCO/VICODIN Take 1 tablet by mouth every 6 (six) hours as needed for up to 7 days for moderate pain.   metFORMIN 500 MG 24 hr tablet Commonly known as:  GLUCOPHAGE XR Take 1 tablet (500 mg total) by mouth daily with breakfast.   nitrofurantoin (macrocrystal-monohydrate) 100 MG capsule Commonly known as:  MACROBID Take 1 capsule (100 mg total) by mouth 2 (two) times daily.   sitaGLIPtin 100 MG tablet Commonly known as:  JANUVIA Take 100 mg by mouth at bedtime.   Vitamin D 50 MCG (2000 UT) Caps Take 2,000 Units by mouth at bedtime.      Follow-up Information    Isabel Caprice, MD.   Specialty:  Gynecologic Oncology Contact information: [place] [address] [phone]        Please follow up.   Why:  Call to confirm followup. You should be seen in 1-2 weeks.          Signed: Isabel Caprice 02/09/2018, 6:14 AM

## 2018-02-08 NOTE — Discharge Instructions (Signed)
02/08/2018   Activity: 1. Be up and out of the bed during the day.  Take a nap if needed.  You may walk up steps but be careful and use the hand rail.  Stair climbing will tire you more than you think, you may need to stop part way and rest.   2. No lifting, pulling, pushing, or straining anything more than 5 pounds for 6 weeks. You may be able to return to full activity at 6 weeks, but this will be further discussed at your postoperative visits.  3. No driving for 2 weeks.  Do Not drive if you are taking narcotic pain medicine.  4. Shower daily.  Use soap and water on your incision and pat dry; don't rub.   5. No sexual activity and nothing in the vagina for 12 weeks.  Medications:  - As long as you have never been told to avoid ibuprofen and/or tylenol use these first for pain control. Take these regularly (every 6 hours) to decrease the build up of pain.  - If necessary, for severe pain not relieved by the above, take your pain prescription.  - While taking your prescription pain medication you should take stool softener (I prefer you purchase docusate sodium "Colace") 2-3 times per day to reduce the likelihood of constipation. If this causes diarrhea, stop its use.  Diet: 1. Low sodium Heart Healthy Diet is recommended. 2. It is safe to use a gentle laxative if you have difficulty moving your bowels as long as you have no nausea or vomiting and you are passing flatus.  Wound Care: 1. Keep clean and dry.  Shower daily. 2. If you have steri-strips on your incision these will fall off after 2-3 weeks. At 3 weeks you may take these off carefully after showering.   Reasons to call the Doctor:   Fever - Oral temperature greater than 100.4 degrees Fahrenheit  Foul-smelling vaginal discharge  Difficulty urinating  Nausea and vomiting  Increased pain at the site of the incision that is unrelieved with pain medicine.  Difficulty breathing with or without chest pain  New calf  pain especially if only on one side  Sudden, continuing increased vaginal bleeding/drainage with or without clots.   Follow-up: 1. See Dr. Gerarda Fraction in 1-2 weeks.  Contacts: For questions or concerns you should contact:  Our office [phone] After hours and on week-ends for urgent issues relating to our treatment for you, please call [phone] and ask to speak to the physician on call for Gynecologic Oncology  We will not refill pain medications if we are not your primary provider or after hours. Plan accordingly if you are running low.

## 2018-02-08 NOTE — Interval H&P Note (Signed)
History and Physical Interval Note:  02/08/2018 6:59 AM  Debra Miranda  has presented today for surgery, with the diagnosis of ENDOMETRIAL CANCER  The various methods of treatment have been discussed with the patient and family. After consideration of risks, benefits and other options for treatment, the patient has consented to  Procedure(s): XI ROBOTIC ASSISTED TOTAL HYSTERECTOMY WITH BILATERAL SALPINGO OOPHORECTOMY (Bilateral) SENTINEL NODE BIOPSY AND POSSIBLE STAGING (Bilateral) as a surgical intervention .  The patient's history has been reviewed, patient examined, no change in status, stable for surgery.  I have reviewed the patient's chart and labs.  Questions were answered to the patient's satisfaction.     Isabel Caprice

## 2018-02-08 NOTE — H&P (Signed)
[facility] Note: New Patient  First Visit  Consult was requested by Dr. Tania Ade for Grade 2 Endometrial Cancer on D&C/Polypectomy     Chief Complaint  Patient presents with  . Endometrial adenocarcinoma [facility])    HPI: Debra Miranda is a very nice 72 y.o. P0  The patient noted right sided pain about 2 months ago. She does have a h/o chrnoic back pain for which she had to see pain management for a time. She has not required narcotic pain medications for some time now. Based on her complaints a CTScan was ordered by her PCP. This revealed "constipation", a fat-containing large mouthed hernia, and "central uterine hypoattenuation at the fundus of 33m" (read by KAbigail Miyamoto MD)  Given the CT findings a GYN ultrasound was ordered. This revealed a 6.7x3.3x3.8cm uterus with a hyperechoic area central uterus 1.5x0.8cm suspicious for a polyp.  The patient had no bleeding symptoms.  Given this information she was referred to Dr. EElonda Huskyand workup ensued [facility] with findings showing a single polyp and grossly normal endometrium.  Unfortunately the polyp returned with Grade 2 Endometrioid Adenocarcinoma "partially involving a polyp".      Dg Chest 2 View  Result Date: 02/03/2018 CLINICAL DATA:  Endometrial carcinoma. Pre-op respiratory exam for hysterectomy. EXAM: CHEST - 2 VIEW COMPARISON:  07/09/2004 FINDINGS: The heart size and mediastinal contours are within normal limits. Both lungs are clear. Mild thoracic spine degenerative changes are noted. IMPRESSION: No active cardiopulmonary disease. Electronically Signed   By: JEarle GellM.D.   On: 02/03/2018 08:24             Outpatient Encounter Medications as of 01/20/2018 Allergies   Medication Sig Allergen Reactions   . atorvastatin (LIPITOR) 20 MG tablet Take 1 tablet (20 mg total) by mouth daily. (Patient taking differently: Take 20 mg by mouth at bedtime. ) .  Latex Itching   . Calcium Carbonate-Vit D-Min (CALCIUM 1200 PO) Take 1 tablet by mouth at bedtime.     . Cholecalciferol (VITAMIN D) 2000 UNITS CAPS Take 2,000 Units by mouth at bedtime.    . Cinnamon 500 MG capsule Take 500 mg by mouth at bedtime.    .Marland Kitchenescitalopram (LEXAPRO) 10 MG tablet Take 1 tablet (10 mg total) by mouth daily. (Patient taking differently: Take 10 mg by mouth at bedtime. )   . metFORMIN (GLUCOPHAGE XR) 500 MG 24 hr tablet Take 1 tablet (500 mg total) by mouth daily with breakfast.   . Omega-3 Fatty Acids (FISH OIL) 1000 MG CAPS Take 1,000 mg by mouth at bedtime.    . sitaGLIPtin (JANUVIA) 100 MG tablet Take 100 mg by mouth at bedtime.    . [DISCONTINUED] diphenhydrAMINE (BENADRYL) 25 MG tablet Take 50 mg by mouth at bedtime.   . [DISCONTINUED] ketorolac (TORADOL) 10 MG tablet Take 1 tablet (10 mg total) by mouth every 8 (eight) hours as needed.   . [DISCONTINUED] ondansetron (ZOFRAN ODT) 8 MG disintegrating tablet Take 1 tablet (8 mg total) by mouth every 8 (eight) hours as needed for nausea or vomiting. (Patient not taking: Reported on 01/12/2018)   No facility-administered encounter medications on file as of 01/20/2018.                   Past Medical History: Past Surgical History:  Diagnosis Date Procedure Laterality Date  . Cataract  . CATARACT EXTRACTION  RIGHT EYE; now removed  RIGHT EYE  . Depression  . HYSTEROSCOPY W/D&C N/A 01/05/2018   many years ago, no meds  Procedure: DILATATION AND CURETTAGE/HYSTEROSCOPY; Surgeon: Florian Buff, MD; Location: [facility]; Service: Gynecology; Laterality: N/A;  . Diabetes mellitus without complication ([place])  . [place] SURGERY  2006  . Environmental allergies  . POLYPECTOMY N/A 01/05/2018  . Hemorrhoids   Procedure: ENDOMETRIAL POLYPECTOMY; Surgeon: Florian Buff, MD; Location: [facility]; Service: Gynecology; Laterality: N/A;  . Orthostatic hypotension  . TONSILLECTOMY     Patient is unaware of this diagnosis  as a child  .  Skin cancer 08/2017    LEFT LOWER LEG       Past Gynecological History:  GYNECOLOGIC HISTORY:  No LMP recorded. Patient is postmenopausal. age 16  Menarche: 72 years old  P 1  Contraceptive OCP and IUD  HRT NOne  Last Pap several yearss, but no h/o abnormal paps Family Hx:  Social Hx:  Tobacco use: cigars ~ 4/ day  Alcohol use: 2 per year  Illicit Drug use: none  Illicit IV Drug use: none          Family History    Problem Relation Age of Onset    . Cervical cancer Mother      treated with radiation    . Pancreatic cancer Mother     . Stroke Father     . Pulmonary fibrosis Brother     . Alzheimer's disease Brother     . Stomach cancer Brother     . Diabetes Brother     . Stroke Paternal Grandfather     . Uterine cancer Maternal Aunt     . Lung cancer Other     . Kidney cancer Maternal Uncle     . Colon cancer Maternal Uncle     . Breast cancer Cousin      cousin       Review of Systems:  Review of Systems  Constitutional: Positive for fatigue.  Genitourinary: Positive for frequency.  Musculoskeletal: Positive for arthralgias.  All other systems reviewed and are negative.   Vitals:     Vitals:   01/20/18 1530  BP: 130/68  Pulse: 86  Resp: 20  Temp: 98.5 F (36.9 C)  SpO2: 98%      Vitals:   01/20/18 1530  Weight: 189 lb (85.7 kg)  Height: 5' 7" (1.702 m)   Body mass index is 29.6 kg/m.  Physical Exam:  General : Appears stated age. Well developed, 72 y.o., female in no apparent distress  HEENT: Normocephalic/atraumatic, symmetric, EOMI, eyelids normal  Neck: Supple, no masses.  Lymphatics: No cervical/ submandibular/ supraclavicular/ infraclavicular/ inguinal adenopathy  Respiratory: Respirations unlabored, no use of accessory muscles  CV: Deferred  Breast: Deferred  Musculoskeletal: No CVA tenderness, normal muscle strength.  Abdomen: Soft, non-tender and nondistended Notable for hernia No masses.  Extremities: No lymphedema, no erythema,  non-tender.  Skin: Normal inspection  Neuro/Psych: No focal motor deficit, no abnormal mental status. Normal gait. Normal affect. Alert and oriented to person, place, and time  Genito Urinary:  Vulva: Normal external female genitalia.  Bladder/urethra: Urethral meatus normal in size and location. No lesions or masses, well supported bladder  Speculum exam:  Vagina: No lesion, no discharge, no bleeding.  Cervix: Normal appearing, no lesions.  Bimanual exam:  Uterus: Normal size, mobile.  Adnexal region: No masses.  Rectovaginal: Good tone, no masses, no cul de sac nodularity, no parametrial involvement  or nodularity.  Assessment  Endometrial Cancer  ECOG PERFORMANCE STATUS: 1 - Symptomatic but completely ambulatory  Plan  Complexity of visit  This is a new problem and no additional workup is planned  Data reviewed  I independently reviewed the images and the radiology reports and discussed my interpretation in the presence of the patient today  I believe there is omentum in the hernia.  Likely this will deliver out of the wide mouth to allow visualization for the hysterectomy to be done robotically I reviewed her referring doctor's office notes and I have summarized in the HPI  History was obtained from the patient and the chart This is an acute illness (cancer) that may pose a threat to life if left untreated  Management is going to include major surgery with comorbidities being her age, tobacco use, hernia, and diabetes. 1. Counseling for uterine cancer We discussed the diagnosis of uterine cancer the typical presentation and prognosis  We reviewed the difference between grade and stage  Standard of care hysterectomy versus alternatives of hormones and radiation were reviewed  We discussed the possibility of additional treatments following surgery, such as radiation and/or chemotherapy 2. Surgical management Patient would like to proceed with standard of care management i.e.  Hysterectomy  Reviewed the options for modalities of hysterectomy  Surgical sketch was reviewed including the risks, benefits, and alternatives. She was given a copy of this today and one will be placed in the chart.  Plan will be for robotic assisted laparoscopic hysterectomy, BSO, sentinel lymph node biopsy, washings, and possible full staging  NOTE the hernia and surgery will need address the omentum that is contained within. I will need to avoid this area for the trocars 3. Preoperative items will include  Obtaining a chest x-ray  Hgb A1C 4. She has strong family history of CA; regardless of her MMR we should refer her to genetic counseling. This was discussed today 5. The patient had no further questions after review of the above 6. I will plan to have her return in 10 to 14 days postop to review the pathology and discuss any further management Mart Piggs, MD  Gynecologic Oncologist  01/21/2018, 11:51 AM  Cc:  Tania Ade, MD (Referring Ob/Gyn)  [name], DO (PCP)

## 2018-02-08 NOTE — Transfer of Care (Signed)
Immediate Anesthesia Transfer of Care Note  Patient: Debra Miranda  Procedure(s) Performed: XI ROBOTIC ASSISTED TOTAL HYSTERECTOMY WITH BILATERAL SALPINGO OOPHORECTOMY (Bilateral ) SENTINEL NODE BIOPSY AND PELVIC LYMPH NODE SAMPLING (Bilateral )  Patient Location: PACU  Anesthesia Type:General  Level of Consciousness: awake, alert , oriented and patient cooperative  Airway & Oxygen Therapy: Patient Spontanous Breathing and Patient connected to face mask oxygen  Post-op Assessment: Report given to RN, Post -op Vital signs reviewed and stable and Patient moving all extremities  Post vital signs: Reviewed and stable  Last Vitals:  Vitals Value Taken Time  BP    Temp    Pulse    Resp    SpO2      Last Pain:  Vitals:   02/08/18 0603  TempSrc:   PainSc: 0-No pain      Patients Stated Pain Goal: 4 (05/06/20 4825)  Complications: No apparent anesthesia complications

## 2018-02-09 ENCOUNTER — Encounter (HOSPITAL_COMMUNITY): Payer: Self-pay | Admitting: Obstetrics

## 2018-02-09 DIAGNOSIS — N83202 Unspecified ovarian cyst, left side: Secondary | ICD-10-CM | POA: Diagnosis not present

## 2018-02-09 DIAGNOSIS — Z23 Encounter for immunization: Secondary | ICD-10-CM | POA: Diagnosis not present

## 2018-02-09 DIAGNOSIS — E119 Type 2 diabetes mellitus without complications: Secondary | ICD-10-CM | POA: Diagnosis not present

## 2018-02-09 DIAGNOSIS — N83201 Unspecified ovarian cyst, right side: Secondary | ICD-10-CM | POA: Diagnosis not present

## 2018-02-09 DIAGNOSIS — F329 Major depressive disorder, single episode, unspecified: Secondary | ICD-10-CM | POA: Diagnosis not present

## 2018-02-09 DIAGNOSIS — C541 Malignant neoplasm of endometrium: Secondary | ICD-10-CM | POA: Diagnosis not present

## 2018-02-09 DIAGNOSIS — K5900 Constipation, unspecified: Secondary | ICD-10-CM | POA: Diagnosis not present

## 2018-02-09 DIAGNOSIS — Z7984 Long term (current) use of oral hypoglycemic drugs: Secondary | ICD-10-CM | POA: Diagnosis not present

## 2018-02-09 DIAGNOSIS — N800 Endometriosis of uterus: Secondary | ICD-10-CM | POA: Diagnosis not present

## 2018-02-09 LAB — CBC
HCT: 43.8 % (ref 36.0–46.0)
Hemoglobin: 14 g/dL (ref 12.0–15.0)
MCH: 28.9 pg (ref 26.0–34.0)
MCHC: 32 g/dL (ref 30.0–36.0)
MCV: 90.3 fL (ref 80.0–100.0)
Platelets: 140 10*3/uL — ABNORMAL LOW (ref 150–400)
RBC: 4.85 MIL/uL (ref 3.87–5.11)
RDW: 12.8 % (ref 11.5–15.5)
WBC: 12.1 10*3/uL — ABNORMAL HIGH (ref 4.0–10.5)
nRBC: 0 % (ref 0.0–0.2)

## 2018-02-09 LAB — BASIC METABOLIC PANEL
Anion gap: 10 (ref 5–15)
BUN: 13 mg/dL (ref 8–23)
CO2: 22 mmol/L (ref 22–32)
Calcium: 9 mg/dL (ref 8.9–10.3)
Chloride: 106 mmol/L (ref 98–111)
Creatinine, Ser: 0.61 mg/dL (ref 0.44–1.00)
GFR calc Af Amer: >60 mL/min (ref >60)
GFR calc non Af Amer: >60 mL/min (ref >60)
GLUCOSE: 195 mg/dL — AB (ref 70–99)
Potassium: 4.4 mmol/L (ref 3.5–5.1)
Sodium: 138 mmol/L (ref 135–145)

## 2018-02-09 LAB — GLUCOSE, CAPILLARY: Glucose-Capillary: 185 mg/dL — ABNORMAL HIGH (ref 70–99)

## 2018-02-09 MED ORDER — IBUPROFEN 200 MG PO TABS
600.0000 mg | ORAL_TABLET | Freq: Four times a day (QID) | ORAL | Status: DC | PRN
  Administered 2018-02-09: 600 mg via ORAL
  Filled 2018-02-09: qty 3

## 2018-02-09 MED ORDER — PNEUMOCOCCAL VAC POLYVALENT 25 MCG/0.5ML IJ INJ
0.5000 mL | INJECTION | Freq: Once | INTRAMUSCULAR | Status: AC
  Administered 2018-02-09: 0.5 mL via INTRAMUSCULAR
  Filled 2018-02-09: qty 0.5

## 2018-02-09 MED ORDER — HYDROCODONE-ACETAMINOPHEN 5-325 MG PO TABS: 1.0000 | ORAL_TABLET | Freq: Four times a day (QID) | ORAL | 0 refills | Status: AC | PRN

## 2018-02-09 NOTE — Progress Notes (Signed)
Pt alert and oriented, tolerating diet.  D/C instructions given, all questions answered. Pt d/cd to home with spouse.

## 2018-02-11 ENCOUNTER — Other Ambulatory Visit (HOSPITAL_COMMUNITY)
Admission: RE | Admit: 2018-02-11 | Discharge: 2018-02-11 | Disposition: A | Discharge status: Home / Self Care | Payer: Medicare PPO | Source: Ambulatory Visit | Attending: Obstetrics & Gynecology | Admitting: Obstetrics & Gynecology

## 2018-02-11 DIAGNOSIS — C541 Malignant neoplasm of endometrium: Secondary | ICD-10-CM | POA: Insufficient documentation

## 2018-02-14 ENCOUNTER — Telehealth: Payer: Self-pay

## 2018-02-14 NOTE — Telephone Encounter (Signed)
Outgoing call to pt per Joylene John NP regarding "how is she doing post-op?"  Pt reports she is still a little sore and is taking Aleve if needed.  Reports she had 3-4 bm today, started out solid then became watery.  I asked if she was taking any stool softners. Pt reports she took Miralax on Wednesday and Thursday.  Advised to not take any Miralax for now. Monitor bms,  and that I will follow up with her later today to see if still having watery bms.  Notified Joylene John NP- she agreed, have pt to continue to monitor and check back with her later today.

## 2018-02-14 NOTE — Telephone Encounter (Signed)
1503: Called pt back, she reports after she spoke to me, she has had no more bm's.  I let her know that Melissa NP felt that it might have been the Miralax that caused watery bm this am and for her to wait a few days before using again, can use stool softner Colace if needed.  Pt voiced understanding.  Instructed her to call our office if diarrhea returns -especially 3 watery stools in a 24 hour period, fever, fatigue, or vomiting because we might have to get a stool specimen. Pt voiced understanding. No other needs per her at this time.

## 2018-02-22 ENCOUNTER — Inpatient Hospital Stay: Payer: Medicare PPO | Attending: Obstetrics | Admitting: Obstetrics

## 2018-02-22 ENCOUNTER — Encounter: Payer: Self-pay | Admitting: Obstetrics

## 2018-02-22 VITALS — BP 145/83 | HR 77 | Temp 98.3°F | Resp 18 | Ht 66.0 in | Wt 187.0 lb

## 2018-02-22 DIAGNOSIS — Z90710 Acquired absence of both cervix and uterus: Secondary | ICD-10-CM | POA: Insufficient documentation

## 2018-02-22 DIAGNOSIS — C541 Malignant neoplasm of endometrium: Secondary | ICD-10-CM

## 2018-02-22 DIAGNOSIS — Z90722 Acquired absence of ovaries, bilateral: Secondary | ICD-10-CM | POA: Insufficient documentation

## 2018-02-22 NOTE — Progress Notes (Addendum)
Progress Note: Established Patient Follow-up Visit   Consult was originally requested by Dr. Tania Ade for Grade 2 Endometrial Cancer on D&C/Polypectomy    Chief Complaint  Patient presents with  . Endometrial adenocarcinoma [facility])    GYN Oncologic Summary 1. Stage IA, Grade 2 Endometrioid Endometrial AdenoCA, negative washings (neg myo, neg LVSI) o S/p Robo TLHyst/BSO/Sentinel LNBx,RPLND, Washings 02/08/18 o Planning Brachy _____  HPI: [name]  is a very nice 73 y.o.  P0  . Interval History Since her last visit she I took her for surgery 02/08/18 and performed Robotic assisted TLH/BSO with sentinel nodes and washings. Frozen section returned with no myo invasion and so further nodal dissection was stopped.  Her final pathology revealed: 1. Uterus, ovaries and fallopian tubes - UTERUS: -ENDOMETRIUM: ENDOMETRIOID ADENOCARCINOMA, FIGO GRADE 2, RESIDUAL TUMOR SPANNING APPROXIMATELY 0.3 CM. NO MYOMETRIAL INVASION IDENTIFIED. SEE ONCOLOGY TABLE. -MYOMETRIUM: ADENOMYOSIS. NO MALIGNANCY. -SEROSA: UNREMARKABLE. NO MALIGNANCY. - CERVIX: BENIGN SQUAMOUS AND ENDOCERVICAL MUCOSA. NO DYSPLASIA OR MALIGNANCY. - BILATERAL OVARIES: INCLUSION CYSTS. NO MALIGNANCY. - BILATERAL FALLOPIAN TUBES: UNREMARKABLE. NO MALIGNANCY. 2. Lymph node, sentinel, biopsy, right obturator - ONE OF ONE LYMPH NODES NEGATIVE FOR CARCINOMA (0/1). 3. Lymph node, sentinel, biopsy, left external iliac - ONE OF ONE LYMPH NODES NEGATIVE FOR CARCINOMA (0/1). 4. Lymph node, biopsy, right pelvic - FIVE OF FIVE LYMPH NODES NEGATIVE FOR CARCINOMA (0/5). Procedure: Total hysterectomy and bilateral salpingo-oophorectomy. Right obturator and left external iliac sentinel lymph node biopsies, right pelvic lymph node biopsy. Histologic type: Endometrioid adenocarcinoma. Histologic Grade: FIGO grade II. Myometrial invasion: None. Depth of invasion: 0 mm Myometrial thickness: 12 mm Uterine Serosa Involvement: Not  identified. Cervical stromal involvement: Not identified. Extent of involvement of other organs: Uninvolved. Lymphovascular invasion: Not identified. Regional Lymph Nodes: Examined: 2 Sentinel 5 Non-sentinel 7 Total Lymph nodes with metastasis: 0 Isolated tumor cells (< 0.2 mm): 0 Micrometastasis: (> 0.2 mm and < 2.0 mm): 0 Macrometastasis: (> 2.0 mm): 0 Extracapsular extension: N/A. Tumor block for ancillary studies: 1I (limited) MMR / MSI testing: Will be ordered on biopsy, due to limited tumor in resection. Pathologic Stage Classification (pTNM, AJCC 8th edition): pT1a, pN0 FIGO Stage: IA Representative Tumor Block: 1I Comment: There is only minimal residual tumor (3 mm). The greatest extent in the biopsy was 5 mm. Pancytokeratin on the lymph nodes is negative.   She is doing well today and is without complaint. Normal bladder bowel function.  Denies fevers denies pain.  . Presenting History (Oncologic Course if applicable) The patient noted right sided pain about 2 months ago. She does have a h/o chrnoic back pain for which she had to see pain management for a time. She has not required  narcotic pain medications for some time now. Based on her complaints a CTScan was ordered by her PCP. This revealed "constipation", a fat-containing large mouthed hernia, and "central uterine hypoattenuation at the fundus of 97m"  (read by KAbigail Miyamoto MD)  Given the CT findings a GYN ultrasound was ordered. This revealed a 6.7x3.3x3.8cm uterus with a hyperechoic area central uterus 1.5x0.8cm suspicious for a polyp.  The patient had no bleeding symptoms.  Given this information she was referred to Dr. EElonda Huskyand [facility] with findings showing a single polyp and grossly normal endometrium.  Unfortunately the polyp returned with Grade 2 Endometrioid Adenocarcinoma "partially involving a polyp".  Imported EPIC Oncologic History:   No history exists.    Measurement of  disease: . TBD  Radiology: Dg Chest 2 View  Result Date: 02/03/2018 CLINICAL DATA:  Endometrial carcinoma. Pre-op respiratory exam for hysterectomy. EXAM: CHEST - 2 VIEW COMPARISON:  07/09/2004 FINDINGS: The heart size and mediastinal contours are within normal limits. Both lungs are clear. Mild thoracic spine degenerative changes are noted. IMPRESSION: No active cardiopulmonary disease. Electronically Signed   By: Earle Gell M.D.   On: 02/03/2018 08:24      Outpatient Encounter Medications as of 02/22/2018  Medication Sig  . atorvastatin (LIPITOR) 20 MG tablet Take 1 tablet (20 mg total) by mouth daily. (Patient taking differently: Take 20 mg by mouth at bedtime. )  . Calcium Carbonate-Vit D-Min (CALCIUM 1200 PO) Take 1 tablet by mouth at bedtime.   . Cholecalciferol (VITAMIN D) 2000 UNITS CAPS Take 2,000 Units by mouth at bedtime.   Marland Kitchen escitalopram (LEXAPRO) 10 MG tablet Take 1 tablet (10 mg total) by mouth daily. (Patient taking differently: Take 10 mg by mouth at bedtime. )  . metFORMIN (GLUCOPHAGE XR) 500 MG 24 hr tablet Take 1 tablet (500 mg total) by mouth daily with breakfast.  . sitaGLIPtin (JANUVIA) 100 MG tablet Take 100 mg by mouth at bedtime.   . [DISCONTINUED] nitrofurantoin, macrocrystal-monohydrate, (MACROBID) 100 MG capsule Take 1 capsule (100 mg total) by mouth 2 (two) times daily. (Patient not taking: Reported on 02/22/2018)   No facility-administered encounter medications on file as of 02/22/2018.    Allergies  Allergen Reactions  . Latex Itching    Past Medical History:  Diagnosis Date  . Arthritis    right hand  . Cataract    RIGHT EYE; now removed  . Depression    many years ago, no meds  . Diabetes mellitus without complication ([place])    type 2  . Environmental allergies   . Hemorrhoids   . Orthostatic hypotension    Patient is unaware of this diagnosis  . Skin cancer 08/2017   LEFT LOWER LEG above foot, uterine cancer   Past Surgical History:  Procedure  Laterality Date  . ABDOMINAL HYSTERECTOMY     Dr. Gerarda Fraction 02-08-18   . CATARACT EXTRACTION     RIGHT EYE  . HYSTEROSCOPY W/D&C N/A 01/05/2018   Procedure: DILATATION AND CURETTAGE/HYSTEROSCOPY;  Surgeon: Florian Buff, MD;  Location: [facility];  Service: Gynecology;  Laterality: N/A;  . LUMBAR Register SURGERY  2006  . POLYPECTOMY N/A 01/05/2018   Procedure: ENDOMETRIAL POLYPECTOMY;  Surgeon: Florian Buff, MD;  Location: [facility];  Service: Gynecology;  Laterality: N/A;  . ROBOTIC ASSISTED TOTAL HYSTERECTOMY WITH BILATERAL SALPINGO OOPHERECTOMY Bilateral 02/08/2018   Procedure: XI ROBOTIC ASSISTED TOTAL HYSTERECTOMY WITH BILATERAL SALPINGO OOPHORECTOMY;  Surgeon: Isabel Caprice, MD;  Location: [facility];  Service: Gynecology;  Laterality: Bilateral;  . SENTINEL NODE BIOPSY Bilateral 02/08/2018   Procedure: SENTINEL NODE BIOPSY AND PELVIC LYMPH NODE SAMPLING;  Surgeon: Isabel Caprice, MD;  Location: [facility];  Service: Gynecology;  Laterality: Bilateral;  . TONSILLECTOMY     as a child        Past Gynecological History:   GYNECOLOGIC HISTORY:  . No LMP recorded. Patient is postmenopausal. age 55 . Menarche: 73 years old . P 1 . Contraceptive OCP and IUD . HRT NOne  . Last Pap several yearss, but no h/o abnormal paps Family Hx:  Family History  Problem Relation Age of Onset  . Cervical cancer Mother        treated with radiation  . Pancreatic cancer Mother   .  Stroke Father   . Pulmonary fibrosis Brother   . Alzheimer's disease Brother   . Stomach cancer Brother   . Diabetes Brother   . Stroke Paternal Grandfather   . Uterine cancer Maternal Aunt   . Lung cancer Other   . Kidney cancer Maternal Uncle   . Colon cancer Maternal Uncle   . Breast cancer Cousin        cousin   Social Hx:  Marland Kitchen Tobacco use: cigars ~ 4/ day . Alcohol use: 2 per year . Illicit Drug use: none . Illicit IV Drug use: none    Review of Systems: Review of Systems  Genitourinary: Positive for frequency.    All other systems reviewed and are negative.   Vitals:  Vitals:   02/22/18 1145  BP: (!) 145/83  Pulse: 77  Resp: 18  Temp: 98.3 F (36.8 C)  SpO2: 100%   Vitals:   02/22/18 1145  Weight: 187 lb (84.8 kg)  Height: _0  (1.676 m)   Body mass index is 30.18 kg/m.  Physical Exam: General :  Appears stated age. Well developed, 73 y.o., female in no apparent distress HEENT:  Normocephalic/atraumatic, symmetric, EOMI, eyelids normal Neck:   Supple, no masses.  Lymphatics:  No cervical/ submandibular/ supraclavicular/ infraclavicular/ inguinal adenopathy Respiratory:  Respirations unlabored, no use of accessory muscles CV:   Deferred Breast:  Deferred Musculoskeletal: No CVA tenderness, normal muscle strength. Abdomen:   Soft, non-tender and nondistended Notable for hernia No masses. Trocar sites are CDI Extremities:  No lymphedema, no erythema, non-tender. Skin:   Normal inspection Neuro/Psych:  No focal motor deficit, no abnormal mental status. Normal gait. Normal affect. Alert and oriented to person, place, and time  Genito Urinary: Deferred today  Assessment  Endometrial Cancer   Plan   1. She has strong family history of CA; regardless of her MMR we should refer her to genetic counseling.  2. We reviewed her pathology and surgical reports today and she was given a copy. 3. Her only risk factor for recurrence is the grade 2 disease.  We did discuss the implications of this and decreasing the risk of local recurrence with vaginal brachii therapy.  4. I will see her back in about 1 month to do vaginal cuff check and she will be cleared to start radiation approximately the end of February 5. No symptoms currently from hernia   Mart Piggs, MD Gynecologic Oncologist 02/22/2018, 1:07 PM    Cc: Tania Ade, MD (Referring Ob/Gyn) [name], DO  (PCP)

## 2018-02-22 NOTE — Patient Instructions (Signed)
Return in one month for a check of the vagina Radiation therapy consult will be requested. They will call you with an appointment

## 2018-02-23 ENCOUNTER — Telehealth: Payer: Self-pay | Admitting: Oncology

## 2018-02-23 NOTE — Telephone Encounter (Signed)
Debra Miranda called back and was notified of Radiation Oncology appointments on 03/16/18.

## 2018-02-23 NOTE — Telephone Encounter (Signed)
Left a message regarding Radiation Oncology appointment.  Requested a return call.

## 2018-03-04 ENCOUNTER — Telehealth: Payer: Self-pay | Admitting: Oncology

## 2018-03-04 NOTE — Telephone Encounter (Signed)
Left a message for Debra Miranda regarding change in Radiation Oncology appointment from 03/16/18 to 03/23/18.  Requested a return call.

## 2018-03-07 NOTE — Telephone Encounter (Signed)
[place]y called back and verified the appointment time on 03/23/18.

## 2018-03-08 ENCOUNTER — Ambulatory Visit: Payer: Medicare PPO | Admitting: Family Medicine

## 2018-03-08 ENCOUNTER — Encounter: Payer: Self-pay | Admitting: Family Medicine

## 2018-03-08 VITALS — BP 124/78 | HR 73 | Temp 98.2°F | Ht 66.0 in | Wt 187.0 lb

## 2018-03-08 DIAGNOSIS — C541 Malignant neoplasm of endometrium: Secondary | ICD-10-CM

## 2018-03-08 DIAGNOSIS — E785 Hyperlipidemia, unspecified: Secondary | ICD-10-CM | POA: Diagnosis not present

## 2018-03-08 DIAGNOSIS — E1169 Type 2 diabetes mellitus with other specified complication: Secondary | ICD-10-CM | POA: Diagnosis not present

## 2018-03-08 DIAGNOSIS — E1165 Type 2 diabetes mellitus with hyperglycemia: Secondary | ICD-10-CM | POA: Diagnosis not present

## 2018-03-08 DIAGNOSIS — E119 Type 2 diabetes mellitus without complications: Secondary | ICD-10-CM | POA: Diagnosis not present

## 2018-03-08 LAB — BAYER DCA HB A1C WAIVED: HB A1C (BAYER DCA - WAIVED): 7.8 % — ABNORMAL HIGH (ref <7.0)

## 2018-03-08 MED ORDER — METFORMIN HCL ER 500 MG PO TB24: 1000.0000 mg | ORAL_TABLET | Freq: Every day | ORAL | 1 refills | Status: DC

## 2018-03-08 NOTE — Patient Instructions (Signed)
The chest x-ray that they got in December looked normal from the report.    Keep your follow-up with your oncologist and radiation oncologist as scheduled.  Make sure that they are sending me copies of the notes.  Your blood sugar is looking better today but is still not at goal.  I would like you to increase your metformin to 2 tablets daily and continue the Januvia daily.  Let me know if there is any issues getting the Januvia.  I have samples of the combination pills if you run out before your new supply comes in.  Have your eye doctor send me a copy of your tests.

## 2018-03-08 NOTE — Progress Notes (Signed)
Subjective: CC: DM2 PCP: [name], DO SJG:GEZMO C Debra Miranda is a 73 y.o. female presenting to clinic today for:  1. Type 2 Diabetes w/ HLD Patient reports compliance w/ Metformin 500 mg p.o. twice daily, Januvia 100mg ,  Lipitor 20mg .  She is awaiting [employer] to contact her with regards to approval for Januvia assistance.  She still has about 3 weeks of the medication left but fears that she may run out soon.  Last eye exam: Sees Dr. Deon Pilling.  She has an upcoming appointment with him. Last foot exam: 08/2017 Last A1c: 8.1 in 12/2017. Nephropathy screen indicated?: Negative 08/2017 Last flu, zoster and/or pneumovax: Flu needed  ROS: Denies LOC, polyuria (urinates frequently regardless.  She attributes this to 2 cups of coffee daily), polydipsia, unintended weight loss/gain, foot ulcerations, numbness or tingling in extremities or chest pain.    2.  Uterine cancer Patient had total hysterectomy in December and notes that she is recovering well.  She has a follow-up with Dr. Gerarda Fraction in February and also will be seeing a radiology oncology for a couple of sessions of radiation to complete her treatment for uterine cancer.  She denies any substantial soreness, vaginal bleeding, nausea/vomiting, abdominal pain or other complications from the surgery.  She has never had a colonoscopy and typically performs FOBT yearly for colon cancer screening.   ROS: Per HPI  Allergies  Allergen Reactions  . Latex Itching   Past Medical History:  Diagnosis Date  . Arthritis    right hand  . Cataract    RIGHT EYE; now removed  . Depression    many years ago, no meds  . Diabetes mellitus without complication ([place])    type 2  . Environmental allergies   . Hemorrhoids   . Orthostatic hypotension    Patient is unaware of this diagnosis  . Skin cancer 08/2017   LEFT LOWER LEG above foot, uterine cancer    Current Outpatient Medications:  .  atorvastatin (LIPITOR) 20 MG tablet, Take 1 tablet (20  mg total) by mouth daily. (Patient taking differently: Take 20 mg by mouth at bedtime. ), Disp: 90 tablet, Rfl: 3 .  Calcium Carbonate-Vit D-Min (CALCIUM 1200 PO), Take 1 tablet by mouth at bedtime. , Disp: , Rfl:  .  Cholecalciferol (VITAMIN D) 2000 UNITS CAPS, Take 2,000 Units by mouth at bedtime. , Disp: , Rfl:  .  escitalopram (LEXAPRO) 10 MG tablet, Take 1 tablet (10 mg total) by mouth daily. (Patient taking differently: Take 10 mg by mouth at bedtime. ), Disp: 90 tablet, Rfl: 3 .  metFORMIN (GLUCOPHAGE XR) 500 MG 24 hr tablet, Take 1 tablet (500 mg total) by mouth daily with breakfast., Disp: 90 tablet, Rfl: 1 .  sitaGLIPtin (JANUVIA) 100 MG tablet, Take 100 mg by mouth at bedtime. , Disp: , Rfl:  Social History   Socioeconomic History  . Marital status: Widowed    Spouse name: Not on file  . Number of children: 1  . Years of education: 31  . Highest education level: Associate degree: occupational, Hotel manager, or vocational program  Occupational History  . Occupation: Retired    Comment: Data processing manager Needs  . Financial resource strain: Not hard at all  . Food insecurity:    Worry: Never true    Inability: Never true  . Transportation needs:    Medical: No    Non-medical: No  Tobacco Use  . Smoking status: Current Every Day Smoker    Years:  45.00    Types: Cigars    Last attempt to quit: 07/29/2010    Years since quitting: 7.6  . Smokeless tobacco: Never Used  . Tobacco comment: 4 small cigars a day  Substance and Sexual Activity  . Alcohol use: Yes    Comment: 1 every 6 months  . Drug use: No  . Sexual activity: Never  Lifestyle  . Physical activity:    Days per week: 7 days    Minutes per session: 120 min  . Stress: Only a little  Relationships  . Social connections:    Talks on phone: More than three times a week    Gets together: More than three times a week    Attends religious service: More than 4 times per year    Active member of club or  organization: Yes    Attends meetings of clubs or organizations: Never    Relationship status: Married  . Intimate partner violence:    Fear of current or ex partner: No    Emotionally abused: No    Physically abused: No    Forced sexual activity: Not on file  Other Topics Concern  . Not on file  Social History Narrative  . Not on file   Family History  Problem Relation Age of Onset  . Cervical cancer Mother        treated with radiation  . Pancreatic cancer Mother   . Stroke Father   . Pulmonary fibrosis Brother   . Alzheimer's disease Brother   . Stomach cancer Brother   . Diabetes Brother   . Stroke Paternal Grandfather   . Uterine cancer Maternal Aunt   . Lung cancer Other   . Kidney cancer Maternal Uncle   . Colon cancer Maternal Uncle   . Breast cancer Cousin        cousin    Objective: Office vital signs reviewed. BP 124/78   Pulse 73   Temp 98.2 F (36.8 C) (Oral)   Ht 5\' 6"  (1.676 m)   Wt 187 lb (84.8 kg)   BMI 30.18 kg/m   Physical Examination:  General: Awake, alert, well appearing, No acute distress HEENT: sclera white, MMM Cardio: regular rate and rhythm, S1S2 heard, no murmurs appreciated Pulm: clear to auscultation bilaterally, no wheezes, rhonchi or rales; normal work of breathing on room air Extremities: warm, No edema, cyanosis or clubbing; +1 pedal pulses bilaterally  Assessment/ Plan: 73 y.o. female   1. Uncontrolled type 2 diabetes mellitus with hyperglycemia (HCC) A1c was 7.8 today.  We discussed that we should pursue tighter control of the blood sugar.  I have increased her metformin XR to 1000 mg daily.  Continue Januvia 100 mg daily.  She will have her diabetic eye exam sent to the office.  Plan to recheck in 3 months, sooner if needed.  Additionally, if she has issues receiving her Januvia on time, she can contact the office and I can provide her with a combination Janumet until she is able to receive her plain Januvia - Bayer DCA Hb  A1c Waived  2. Hyperlipidemia associated with type 2 diabetes mellitus (HCC) Continue Lipitor  3. Endometrial adenocarcinoma ([place]) Status post total hysterectomy.  She has follow-up with her oncologist soon.  We did discuss consideration for colonoscopy given history of uterine cancer.  She is to consider this and follow-up.  If she is not willing to do a colonoscopy, plan for Cologuard as an alternative.  We will continue to discuss  this at each visit. - CBC     Orders Placed This Encounter  Procedures  . Bayer DCA Hb A1c Waived  . CBC   Meds ordered this encounter  Medications  . metFORMIN (GLUCOPHAGE XR) 500 MG 24 hr tablet    Sig: Take 2 tablets (1,000 mg total) by mouth daily with breakfast.    Dispense:  180 tablet    Refill:  [place], DO [place] [phone]

## 2018-03-09 LAB — CBC
HEMOGLOBIN: 15 g/dL (ref 11.1–15.9)
Hematocrit: 43.7 % (ref 34.0–46.6)
MCH: 29.4 pg (ref 26.6–33.0)
MCHC: 34.3 g/dL (ref 31.5–35.7)
MCV: 86 fL (ref 79–97)
Platelets: 133 10*3/uL — ABNORMAL LOW (ref 150–450)
RBC: 5.11 x10E6/uL (ref 3.77–5.28)
RDW: 13.3 % (ref 11.7–15.4)
WBC: 6.4 10*3/uL (ref 3.4–10.8)

## 2018-03-16 ENCOUNTER — Ambulatory Visit: Payer: Medicare PPO | Admitting: Radiation Oncology

## 2018-03-16 ENCOUNTER — Ambulatory Visit: Payer: Medicare PPO

## 2018-03-18 NOTE — Progress Notes (Signed)
GYN Location of Tumor / Histology: Endometrial adenocarcinoma ([place])  Debra Miranda presented with symptoms of: The patient noted right sided pain about 2 months ago. She does have a h/o chrnoic back pain for which she had to see pain management for a time. She has not required narcotic pain medications for some time now. Based on her complaints a CTScan was ordered by her PCP. This revealed "constipation", a fat-containing large mouthed hernia, and "central uterine hypoattenuation at the fundus of 54mm" (read by Abigail Miyamoto, MD)  Given the CT findings a GYN ultrasound was ordered. This revealed a 6.7x3.3x3.8cm uterus with a hyperechoic area central uterus 1.5x0.8cm suspicious for a polyp.  The patient had no bleeding symptoms.  Given this information she was referred to Dr. Elonda Husky and workup ensued including [facility] D&C with findings showing a single polyp and grossly normal endometrium.  Unfortunately the polyp returned with Grade 2 Endometrioid Adenocarcinoma "partially involving a polyp".  Biopsies revealed: 02/08/18:  Diagnosis 1. Uterus, ovaries and fallopian tubes - UTERUS: -ENDOMETRIUM: ENDOMETRIOID ADENOCARCINOMA, FIGO GRADE 2, RESIDUAL TUMOR SPANNING APPROXIMATELY 0.3 CM. NO MYOMETRIAL INVASION IDENTIFIED. SEE ONCOLOGY TABLE. -MYOMETRIUM: ADENOMYOSIS. NO MALIGNANCY. -SEROSA: UNREMARKABLE. NO MALIGNANCY. - CERVIX: BENIGN SQUAMOUS AND ENDOCERVICAL MUCOSA. NO DYSPLASIA OR MALIGNANCY. - BILATERAL OVARIES: INCLUSION CYSTS. NO MALIGNANCY. - BILATERAL FALLOPIAN TUBES: UNREMARKABLE. NO MALIGNANCY.  Past/Anticipated interventions by Gyn/Onc surgery, if any: 02/08/18:  Operation:  1. Robotic-assisted laparoscopic total hysterectomy with bilateral salpingoophorectomy 2. SLN biopsy 3. Pelvic washings Pelvic lymph node sampling Surgeon: Mart Piggs, MD  Past/Anticipated interventions by medical oncology, if any: None at this time.  Weight changes, if any: No  Bowel/Bladder  complaints, if any: Has some age related frequency.  Has some constipation/diarrhea due to metformin.   Nausea/Vomiting, if any: No  Pain issues, if any:  No pain just feels bloated.  SAFETY ISSUES:  Prior radiation? No  Pacemaker/ICD? No  Possible current pregnancy? No  Is the patient on methotrexate? No  Current Complaints / other details:

## 2018-03-23 ENCOUNTER — Other Ambulatory Visit: Payer: Self-pay

## 2018-03-23 ENCOUNTER — Encounter: Payer: Self-pay | Admitting: Radiation Oncology

## 2018-03-23 ENCOUNTER — Ambulatory Visit
Admission: RE | Admit: 2018-03-23 | Discharge: 2018-03-23 | Disposition: A | Discharge status: Home / Self Care | Payer: Medicare PPO | Source: Ambulatory Visit | Attending: Radiation Oncology | Admitting: Radiation Oncology

## 2018-03-23 ENCOUNTER — Inpatient Hospital Stay: Payer: Medicare PPO | Attending: Obstetrics | Admitting: Obstetrics

## 2018-03-23 ENCOUNTER — Encounter: Payer: Self-pay | Admitting: Obstetrics

## 2018-03-23 VITALS — BP 140/78 | HR 78 | Temp 97.8°F | Resp 20 | Ht 66.0 in | Wt 188.6 lb

## 2018-03-23 VITALS — BP 119/67 | HR 78 | Temp 99.1°F | Resp 20 | Ht 66.0 in | Wt 188.0 lb

## 2018-03-23 DIAGNOSIS — F17290 Nicotine dependence, other tobacco product, uncomplicated: Secondary | ICD-10-CM | POA: Insufficient documentation

## 2018-03-23 DIAGNOSIS — Z79899 Other long term (current) drug therapy: Secondary | ICD-10-CM | POA: Insufficient documentation

## 2018-03-23 DIAGNOSIS — Z90711 Acquired absence of uterus with remaining cervical stump: Secondary | ICD-10-CM | POA: Diagnosis not present

## 2018-03-23 DIAGNOSIS — N938 Other specified abnormal uterine and vaginal bleeding: Secondary | ICD-10-CM | POA: Insufficient documentation

## 2018-03-23 DIAGNOSIS — Z923 Personal history of irradiation: Secondary | ICD-10-CM | POA: Insufficient documentation

## 2018-03-23 DIAGNOSIS — C541 Malignant neoplasm of endometrium: Secondary | ICD-10-CM | POA: Insufficient documentation

## 2018-03-23 DIAGNOSIS — Z510 Encounter for antineoplastic radiation therapy: Secondary | ICD-10-CM | POA: Insufficient documentation

## 2018-03-23 DIAGNOSIS — Z85828 Personal history of other malignant neoplasm of skin: Secondary | ICD-10-CM | POA: Insufficient documentation

## 2018-03-23 DIAGNOSIS — Z808 Family history of malignant neoplasm of other organs or systems: Secondary | ICD-10-CM | POA: Diagnosis not present

## 2018-03-23 DIAGNOSIS — E119 Type 2 diabetes mellitus without complications: Secondary | ICD-10-CM | POA: Insufficient documentation

## 2018-03-23 DIAGNOSIS — Z90722 Acquired absence of ovaries, bilateral: Secondary | ICD-10-CM

## 2018-03-23 DIAGNOSIS — Z90710 Acquired absence of both cervix and uterus: Secondary | ICD-10-CM | POA: Insufficient documentation

## 2018-03-23 NOTE — Progress Notes (Signed)
Radiation Oncology         (336) [phone] ________________________________  Initial Outpatient Consultation  Name: Debra Miranda MRN: [id number]  Date: 03/23/2018  DOB: 05-05-1945  VO:[id number], Koleen Distance, DO  Isabel Caprice, MD   REFERRING PHYSICIAN: Isabel Caprice, MD  DIAGNOSIS: The encounter diagnosis was Endometrial adenocarcinoma [facility]).   FIGO grade 2, Stage IA, Endometrial adenocarcinoma ([place])  HISTORY OF PRESENT ILLNESS::Debra Miranda is a 73 y.o. female who is presenting to the office today for evaluation of endometrioid adenocarcinoma. She presented with right side abdominal pain. Based on this complaint, a CT scan was ordered by her PCP.   Given the CT findings a GYN ultrasound was ordered. This revealed a 6.7x3.3x3.8cm uterus with a hyperechoic area central uterus 1.5x0.8cm suspicious for a polyp.   She had robotic-assisted laparoscopic total hysterectomy with bilateral salpingoophorectomy, SLN biopsy, and pelvic washing on 02/08/18. Biopsies from December 24 revealed grade 2 endometrioid adenocarcinoma. In the cervix, benign squamous and endocervical mucosa was present. Bilateral ovaries included cysts, no malignancy. Washings were negative.   she reports associated diarrhea and constipation attributed to her Metformin. She reports urinary leakage. she denies pain, numbness, vaginal bleeding and any other symptoms.    PREVIOUS RADIATION THERAPY: No  PAST MEDICAL HISTORY:  has a past medical history of Arthritis, Cataract, Depression, Diabetes mellitus without complication ([place]), Environmental allergies, Hemorrhoids, Orthostatic hypotension, and Skin cancer (08/2017).    PAST SURGICAL HISTORY: Past Surgical History:  Procedure Laterality Date  . ABDOMINAL HYSTERECTOMY     Dr. Gerarda Fraction 02-08-18   . CATARACT EXTRACTION     RIGHT EYE  . HYSTEROSCOPY W/D&C N/A 01/05/2018   Procedure: DILATATION AND CURETTAGE/HYSTEROSCOPY;  Surgeon: Florian Buff, MD;  Location: [facility];   Service: Gynecology;  Laterality: N/A;  . LUMBAR [place] SURGERY  2006  . POLYPECTOMY N/A 01/05/2018   Procedure: ENDOMETRIAL POLYPECTOMY;  Surgeon: Florian Buff, MD;  Location: [facility];  Service: Gynecology;  Laterality: N/A;  . ROBOTIC ASSISTED TOTAL HYSTERECTOMY WITH BILATERAL SALPINGO OOPHERECTOMY Bilateral 02/08/2018   Procedure: XI ROBOTIC ASSISTED TOTAL HYSTERECTOMY WITH BILATERAL SALPINGO OOPHORECTOMY;  Surgeon: Isabel Caprice, MD;  Location: [facility];  Service: Gynecology;  Laterality: Bilateral;  . SENTINEL NODE BIOPSY Bilateral 02/08/2018   Procedure: SENTINEL NODE BIOPSY AND PELVIC LYMPH NODE SAMPLING;  Surgeon: Isabel Caprice, MD;  Location: [facility];  Service: Gynecology;  Laterality: Bilateral;  . TONSILLECTOMY     as a child    FAMILY HISTORY: family history includes Alzheimer's disease in her brother; Breast cancer in her cousin; Cervical cancer in her mother; Colon cancer in her maternal uncle; Diabetes in her brother; Kidney cancer in her maternal uncle; Lung cancer in an other family member; Pancreatic cancer in her mother; Pulmonary fibrosis in her brother; Stomach cancer in her brother; Stroke in her father and paternal grandfather; Uterine cancer in her maternal aunt.  SOCIAL HISTORY:  reports that she has been smoking cigars. She has smoked for the past 45.00 years. She has never used smokeless tobacco. She reports current alcohol use. She reports that she does not use drugs.  ALLERGIES: Latex  MEDICATIONS:  Current Outpatient Medications  Medication Sig Dispense Refill  . atorvastatin (LIPITOR) 20 MG tablet Take 1 tablet (20 mg total) by mouth daily. (Patient taking differently: Take 20 mg by mouth at bedtime. ) 90 tablet 3  . Calcium Carbonate-Vit D-Min (CALCIUM 1200 PO) Take 1 tablet by mouth at bedtime.     Marland Kitchen  Cholecalciferol (VITAMIN D) 2000 UNITS CAPS Take 2,000 Units by mouth at bedtime.     Marland Kitchen escitalopram (LEXAPRO) 10 MG tablet Take 1 tablet (10 mg total) by mouth  daily. (Patient taking differently: Take 10 mg by mouth at bedtime. ) 90 tablet 3  . metFORMIN (GLUCOPHAGE XR) 500 MG 24 hr tablet Take 2 tablets (1,000 mg total) by mouth daily with breakfast. 180 tablet 1  . sitaGLIPtin (JANUVIA) 100 MG tablet Take 100 mg by mouth at bedtime.      No current facility-administered medications for this encounter.     REVIEW OF SYSTEMS:  A 10+ POINT REVIEW OF SYSTEMS WAS OBTAINED including neurology, dermatology, psychiatry, cardiac, respiratory, lymph, extremities, GI, GU, musculoskeletal, constitutional, reproductive, HEENT. All pertinent positives are noted in the HPI. All others are negative.    PHYSICAL EXAM:  height is 5\' 6"  (1.676 m) and weight is 188 lb (85.3 kg). Her oral temperature is 99.1 F (37.3 C). Her blood pressure is 119/67 and her pulse is 78. Her respiration is 20 and oxygen saturation is 96%.   General: Alert and oriented, in no acute distress HEENT: Head is normocephalic. Extraocular movements are intact. Oropharynx is clear. Neck: Neck is supple, no palpable cervical or supraclavicular lymphadenopathy. Heart: Regular in rate and rhythm with no murmurs, rubs, or gallops. Chest: Clear to auscultation bilaterally, with no rhonchi, wheezes, or rales. Abdomen: Soft, nontender, nondistended, with no rigidity or guarding. Extremities: No cyanosis or edema. Lymphatics: see Neck Exam Skin: No concerning lesions. Musculoskeletal: symmetric strength and muscle tone throughout. Neurologic: Cranial nerves II through XII are grossly intact. No obvious focalities. Speech is fluent. Coordination is intact. Psychiatric: Judgment and insight are intact. Affect is appropriate. Pelvic exam deferred in light of exam earlier today by Dr. Gerarda Fraction.    ECOG = 1  0 - Asymptomatic (Fully active, able to carry on all predisease activities without restriction)  1 - Symptomatic but completely ambulatory (Restricted in physically strenuous activity but  ambulatory and able to carry out work of a light or sedentary nature. For example, light housework, office work)  2 - Symptomatic, <50% in bed during the day (Ambulatory and capable of all self care but unable to carry out any work activities. Up and about more than 50% of waking hours)  3 - Symptomatic, >50% in bed, but not bedbound (Capable of only limited self-care, confined to bed or chair 50% or more of waking hours)  4 - Bedbound (Completely disabled. Cannot carry on any self-care. Totally confined to bed or chair)  5 - Death   Eustace Pen MM, Creech RH, Tormey DC, et al. [phone]). "Toxicity and response criteria of the [facility]". [place] Oncol. 5 (6): 649-55  LABORATORY DATA:  Lab Results  Component Value Date   WBC 6.4 03/08/2018   HGB 15.0 03/08/2018   HCT 43.7 03/08/2018   MCV 86 03/08/2018   PLT 133 (L) 03/08/2018   NEUTROABS 6.0 08/27/2017   Lab Results  Component Value Date   NA 138 02/09/2018   K 4.4 02/09/2018   CL 106 02/09/2018   CO2 22 02/09/2018   GLUCOSE 195 (H) 02/09/2018   CREATININE 0.61 02/09/2018   CALCIUM 9.0 02/09/2018      RADIOGRAPHY: No results found.    IMPRESSION: FIGO grade 2, Stage IA, Endometrial adenocarcinoma ([place]).  We discussed her pathology in detail as it relates to her age. We discussed that she qualifies as a high-intermediate risk of recurrence  due to her age (over 45) and grade of the tumor. She qualifies for treatment to reduce the chance of vaginal cuff recurrence.   Today, I talked to the patient about the findings and work-up thus far.  We discussed the natural history of her disease and general treatment, highlighting the role of radiotherapy (brachytherapy) in the management.  We discussed the available radiation techniques, and focused on the details of logistics and delivery.  We also discussed potential side effects associated with this treatment.  The patient was encouraged to ask questions that I  answered to the best of my ability.  A patient consent form was discussed and signed.  We retained a copy for our records.    The patient would like to proceed with adjuvant vaginal brachytherapy, particularly in light of her family history of malignancies.     PLAN:Pt will be scheduled for planning and her first vaginal brachytherapy tx approximately 8 weeks out from recent surgery. Anticipate 5 brachytherapy treatments directed at the vaginal cuff, using Iridium-192 as the high-dose-rate source. Pt will proceed with genetics evaluation given her strong family hx of malignancies.    ------------------------------------------------  Blair Promise, PhD, MD      This document serves as a record of services personally performed by Gery Pray, MD. It was created on his behalf by Mary-Margaret Loma Messing, a trained medical scribe. The creation of this record is based on the scribe's personal observations and the provider's statements to them. This document has been checked and approved by the attending provider.

## 2018-03-23 NOTE — Patient Instructions (Addendum)
Genetics referral will be made Activity restrictions as discussed. No mowing the yard. Limit your core activities. Return in 6 months for followup with our office. Call the office at [phone] in July to schedule follow up. Followup Dr. Sondra Come today as scheduled.

## 2018-03-23 NOTE — Progress Notes (Signed)
[place] at [facility]   Progress Note: Established Patient Follow-up Visit   Consult was originally requested by Dr. Tania Ade for Grade 2 Endometrial Cancer on D&C/Polypectomy    Chief Complaint  Patient presents with  . Endometrial adenocarcinoma [facility])    GYN Oncologic Summary 1. Stage IA, Grade 2 Endometrioid Endometrial AdenoCA, negative washings (neg myo, neg LVSI) o S/p Robo TLHyst/BSO/Sentinel LNBx,RPLND, Washings 02/08/18 o Planning Brachy _____  HPI: Ms. ARTHUR AYDELOTTE  is a very nice 73 y.o.  P0  . Interval History Since her last visit she is doing well. Notes slight bloating. No N/V. No bleeding or vaginal drainage. Asking me if she can vacuum/return to routine activity. She is scheduled to see Dr. Sondra Come today.  . Presenting History (Oncologic Course if applicable) The patient noted right sided pain about 2 months ago. She does have a h/o chrnoic back pain for which she had to see pain management for a time. She has not required  narcotic pain medications for some time now. Based on her complaints a CTScan was ordered by her PCP. This revealed "constipation", a fat-containing large mouthed hernia, and "central uterine hypoattenuation at the fundus of 48m"  (read by KAbigail Miyamoto MD)  Given the CT findings a GYN ultrasound was ordered. This revealed a 6.7x3.3x3.8cm uterus with a hyperechoic area central uterus 1.5x0.8cm suspicious for a polyp.  The patient had no bleeding symptoms.  Given this information she was referred to Dr. EElonda Huskyand workup ensued [facility] with findings showing a single polyp and grossly normal endometrium.  Unfortunately the polyp returned with Grade 2 Endometrioid Adenocarcinoma "partially involving a polyp".  Dr. PGerarda Fractiontook her for surgery 02/08/18 and performed Robotic assisted TLH/BSO with sentinel nodes and washings. Frozen section returned with no myo invasion and so further nodal  dissection was stopped.  Her final pathology revealed: 1. Uterus, ovaries and fallopian tubes - UTERUS: -ENDOMETRIUM: ENDOMETRIOID ADENOCARCINOMA, FIGO GRADE 2, RESIDUAL TUMOR SPANNING APPROXIMATELY 0.3 CM. NO MYOMETRIAL INVASION IDENTIFIED. SEE ONCOLOGY TABLE. -MYOMETRIUM: ADENOMYOSIS. NO MALIGNANCY. -SEROSA: UNREMARKABLE. NO MALIGNANCY. - CERVIX: BENIGN SQUAMOUS AND ENDOCERVICAL MUCOSA. NO DYSPLASIA OR MALIGNANCY. - BILATERAL OVARIES: INCLUSION CYSTS. NO MALIGNANCY. - BILATERAL FALLOPIAN TUBES: UNREMARKABLE. NO MALIGNANCY. 2. Lymph node, sentinel, biopsy, right obturator - ONE OF ONE LYMPH NODES NEGATIVE FOR CARCINOMA (0/1). 3. Lymph node, sentinel, biopsy, left external iliac - ONE OF ONE LYMPH NODES NEGATIVE FOR CARCINOMA (0/1). 4. Lymph node, biopsy, right pelvic - FIVE OF FIVE LYMPH NODES NEGATIVE FOR CARCINOMA (0/5). Procedure: Total hysterectomy and bilateral salpingo-oophorectomy. Right obturator and left external iliac sentinel lymph node biopsies, right pelvic lymph node biopsy. Histologic type: Endometrioid adenocarcinoma. Histologic Grade: FIGO grade II. Myometrial invasion: None. Depth of invasion: 0 mm Myometrial thickness: 12 mm Uterine Serosa Involvement: Not identified. Cervical stromal involvement: Not identified. Extent of involvement of other organs: Uninvolved. Lymphovascular invasion: Not identified. Regional Lymph Nodes: Examined: 2 Sentinel 5 Non-sentinel 7 Total Lymph nodes with metastasis: 0 Isolated tumor cells (< 0.2 mm): 0 Micrometastasis: (> 0.2 mm and < 2.0 mm): 0 Macrometastasis: (> 2.0 mm): 0 Extracapsular extension: N/A. Tumor block for ancillary studies: 1I (limited) MMR / MSI testing: Will be ordered on biopsy, due to limited tumor in resection. Pathologic Stage Classification (pTNM, AJCC 8th edition): pT1a, pN0 FIGO Stage: IA Representative Tumor Block: 1I Comment: There is only minimal residual tumor (3 mm). The greatest extent in  the biopsy was 5 mm. Pancytokeratin on the  lymph nodes is negative. Washings -- negative Preop CXR -- negative  Imported EPIC Oncologic History:   No history exists.    Measurement of disease: . TBD  Radiology: Dg Chest 2 View  Result Date: 02/03/2018 CLINICAL DATA:  Endometrial carcinoma. Pre-op respiratory exam for hysterectomy. EXAM: CHEST - 2 VIEW COMPARISON:  07/09/2004 FINDINGS: The heart size and mediastinal contours are within normal limits. Both lungs are clear. Mild thoracic spine degenerative changes are noted. IMPRESSION: No active cardiopulmonary disease. Electronically Signed   By: Earle Gell M.D.   On: 02/03/2018 08:24      Outpatient Encounter Medications as of 03/23/2018  Medication Sig  . atorvastatin (LIPITOR) 20 MG tablet Take 1 tablet (20 mg total) by mouth daily. (Patient taking differently: Take 20 mg by mouth at bedtime. )  . Calcium Carbonate-Vit D-Min (CALCIUM 1200 PO) Take 1 tablet by mouth at bedtime.   . Cholecalciferol (VITAMIN D) 2000 UNITS CAPS Take 2,000 Units by mouth at bedtime.   Marland Kitchen escitalopram (LEXAPRO) 10 MG tablet Take 1 tablet (10 mg total) by mouth daily. (Patient taking differently: Take 10 mg by mouth at bedtime. )  . metFORMIN (GLUCOPHAGE XR) 500 MG 24 hr tablet Take 2 tablets (1,000 mg total) by mouth daily with breakfast.  . sitaGLIPtin (JANUVIA) 100 MG tablet Take 100 mg by mouth at bedtime.    No facility-administered encounter medications on file as of 03/23/2018.    Allergies  Allergen Reactions  . Latex Itching    Past Medical History:  Diagnosis Date  . Arthritis    right hand  . Cataract    RIGHT EYE; now removed  . Depression    many years ago, no meds  . Diabetes mellitus without complication ([place])    type 2  . Environmental allergies   . Hemorrhoids   . Orthostatic hypotension    Patient is unaware of this diagnosis  . Skin cancer 08/2017   LEFT LOWER LEG above foot, uterine cancer   Past Surgical History:   Procedure Laterality Date  . ABDOMINAL HYSTERECTOMY     Dr. Gerarda Fraction 02-08-18   . CATARACT EXTRACTION     RIGHT EYE  . HYSTEROSCOPY W/D&C N/A 01/05/2018   Procedure: DILATATION AND CURETTAGE/HYSTEROSCOPY;  Surgeon: Florian Buff, MD;  Location: [facility];  Service: Gynecology;  Laterality: N/A;  . LUMBAR [place] SURGERY  2006  . POLYPECTOMY N/A 01/05/2018   Procedure: ENDOMETRIAL POLYPECTOMY;  Surgeon: Florian Buff, MD;  Location: [facility];  Service: Gynecology;  Laterality: N/A;  . ROBOTIC ASSISTED TOTAL HYSTERECTOMY WITH BILATERAL SALPINGO OOPHERECTOMY Bilateral 02/08/2018   Procedure: XI ROBOTIC ASSISTED TOTAL HYSTERECTOMY WITH BILATERAL SALPINGO OOPHORECTOMY;  Surgeon: Isabel Caprice, MD;  Location[facility];  Service: Gynecology;  Laterality: Bilateral;  . SENTINEL NODE BIOPSY Bilateral 02/08/2018   Procedure: SENTINEL NODE BIOPSY AND PELVIC LYMPH NODE SAMPLING;  Surgeon: Isabel Caprice, MD;  Location[facility];  Service: Gynecology;  Laterality: Bilateral;  . TONSILLECTOMY     as a child        Past Gynecological History:   GYNECOLOGIC HISTORY:  . No LMP recorded. Patient is postmenopausal. age 77 . Menarche: 73 years old . P 1 . Contraceptive OCP and IUD . HRT NOne  . Last Pap several yearss, but no h/o abnormal paps Family Hx:  Family History  Problem Relation Age of Onset  . Cervical cancer Mother        treated with radiation  . Pancreatic cancer  Mother   . Stroke Father   . Pulmonary fibrosis Brother   . Alzheimer's disease Brother   . Stomach cancer Brother   . Diabetes Brother   . Stroke Paternal Grandfather   . Uterine cancer Maternal Aunt   . Lung cancer Other   . Kidney cancer Maternal Uncle   . Colon cancer Maternal Uncle   . Breast cancer Cousin        cousin   Social Hx:  Marland Kitchen Tobacco use: cigars ~ 4/ day . Alcohol use: 2 per year . Illicit Drug use: none . Illicit IV Drug use: none    Review of Systems: Review of Systems  Constitutional: Positive for  fatigue.  All other systems reviewed and are negative. bloating. Denies bleeding/drainage per vagina.  Vitals:  Vitals:   03/23/18 1427  BP: 140/78  Pulse: 78  Resp: 20  Temp: 97.8 F (36.6 C)  SpO2: 98%   Vitals:   03/23/18 1427  Weight: 188 lb 9.6 oz (85.5 kg)  Height: _0  (1.676 m)   Body mass index is 30.44 kg/m.  Physical Exam: General :  Appears stated age. Well developed, 73 y.o., female in no apparent distress HEENT:  Normocephalic/atraumatic, symmetric, EOMI, eyelids normal Neck:   Supple, no masses.  Lymphatics:  No cervical/ submandibular/ supraclavicular/ infraclavicular/ inguinal adenopathy Respiratory:  Respirations unlabored, no use of accessory muscles CV:   Deferred Breast:  Deferred Musculoskeletal: No CVA tenderness, normal muscle strength. Abdomen:   Soft, non-tender and nondistended Notable for hernia No masses. Trocar sites are CDI Extremities:  No lymphedema, no erythema, non-tender. Skin:   Normal inspection Neuro/Psych:  No focal motor deficit, no abnormal mental status. Normal gait. Normal affect. Alert and oriented to person, place, and time  Genito Urinary: Vulva: Normal external female genitalia.  Bladder/urethra: Urethral meatus normal in size and location. No lesions or   masses, well supported bladder Speculum exam: Vagina: No lesion, no discharge, no bleeding. Vaginal cuff intact Bimanual exam: Cervix/Uterus/Adnexa: Surgically absent  Adnexal region: No masses. Rectovaginal:  Deferred today as this was a vaginal cuff check    Assessment  Endometrial Cancer   Plan   1. She has strong family history of CA;  o She agrees to referral to genetics. 2. We previously reviewed her pathology and surgical reports today and she was given a copy. 3. Her only risk factor for recurrence is the grade 2 disease.  She had no myo invasion. o We did discuss the implications of this and discussing with radiation vaginal brachii therapy to decrease  the risk of local recurrence  4. Surveillance plan will be for visits here every 6 months and intervening visits with Dr. Sondra Come every 6 months so one of [place] is seeing her every 3 months. This plan will likely alter after the 2 year mark. 5. Activity restrictions were reviewed.  Mart Piggs, MD Gynecologic Oncologist 03/23/2018, 2:48 PM    Cc: Tania Ade, MD (Referring Ob/Gyn) [name], DO  (PCP)

## 2018-03-25 ENCOUNTER — Telehealth: Payer: Self-pay | Admitting: *Deleted

## 2018-03-25 NOTE — Telephone Encounter (Signed)
Called patient to inform of New HDR [facility], spoke with patient and she is aware of these appts. 

## 2018-04-04 ENCOUNTER — Telehealth: Payer: Self-pay | Admitting: *Deleted

## 2018-04-04 NOTE — Telephone Encounter (Signed)
CALLED PATIENT TO REMIND OF NEW HDR [facility] FOR 04-05-18, SPOKE WITH PATIENT AND SHE IS AWARE OF THESE APPTS.

## 2018-04-04 NOTE — Telephone Encounter (Signed)
XXXX 

## 2018-04-05 ENCOUNTER — Ambulatory Visit
Admission: RE | Admit: 2018-04-05 | Discharge: 2018-04-05 | Disposition: A | Discharge status: Home / Self Care | Payer: Medicare PPO | Source: Ambulatory Visit | Attending: Radiation Oncology | Admitting: Radiation Oncology

## 2018-04-05 ENCOUNTER — Encounter: Payer: Self-pay | Admitting: Radiation Oncology

## 2018-04-05 ENCOUNTER — Other Ambulatory Visit: Payer: Self-pay

## 2018-04-05 VITALS — BP 141/76 | HR 80 | Temp 98.4°F | Resp 18 | Ht 66.0 in | Wt 189.4 lb

## 2018-04-05 DIAGNOSIS — C541 Malignant neoplasm of endometrium: Secondary | ICD-10-CM

## 2018-04-05 DIAGNOSIS — Z7984 Long term (current) use of oral hypoglycemic drugs: Secondary | ICD-10-CM | POA: Diagnosis not present

## 2018-04-05 DIAGNOSIS — E119 Type 2 diabetes mellitus without complications: Secondary | ICD-10-CM | POA: Diagnosis not present

## 2018-04-05 DIAGNOSIS — F17290 Nicotine dependence, other tobacco product, uncomplicated: Secondary | ICD-10-CM | POA: Diagnosis not present

## 2018-04-05 DIAGNOSIS — Z79899 Other long term (current) drug therapy: Secondary | ICD-10-CM | POA: Insufficient documentation

## 2018-04-05 DIAGNOSIS — Z4689 Encounter for fitting and adjustment of other specified devices: Secondary | ICD-10-CM | POA: Diagnosis not present

## 2018-04-05 DIAGNOSIS — Z85828 Personal history of other malignant neoplasm of skin: Secondary | ICD-10-CM | POA: Diagnosis not present

## 2018-04-05 DIAGNOSIS — Z510 Encounter for antineoplastic radiation therapy: Secondary | ICD-10-CM | POA: Diagnosis not present

## 2018-04-05 NOTE — Progress Notes (Signed)
Pt presents today for new [place] HDR with Dr. Sondra Come. Pt is unaccompanied. Pt denies c/o pain. Pt denies dysuria/hematuria. Pt denies vaginal bleeding/discharge. Pt reports having hemmoroids that bleed occasionally. Pt denies diarrhea/constipation. Pt reports occasional abdomen. Pt denies N/V.   BP (!) 141/76 (BP Location: Left Arm, Patient Position: Sitting)   Pulse 80   Temp 98.4 F (36.9 C) (Oral)   Resp 18   Ht 5\' 6"  (1.676 m)   Wt 189 lb 6 oz (85.9 kg)   SpO2 96%   BMI 30.57 kg/m   Wt Readings from Last 3 Encounters:  04/05/18 189 lb 6 oz (85.9 kg)  03/23/18 188 lb (85.3 kg)  03/23/18 188 lb 9.6 oz (85.5 kg)   Loma Sousa, RN BSN

## 2018-04-05 NOTE — Progress Notes (Signed)
  Radiation Oncology         (336) [phone] ________________________________  Name: Debra Miranda MRN: [id number]  Date: 04/05/2018  DOB: [birth year]  SIMULATION AND TREATMENT PLANNING NOTE HDR BRACHYTHERAPY  DIAGNOSIS:  endometrial cancer  NARRATIVE:  The patient was brought to the [place] suite.  Identity was confirmed.  All relevant records and images related to the planned course of therapy were reviewed.  The patient freely provided informed written consent to proceed with treatment after reviewing the details related to the planned course of therapy. The consent form was witnessed and verified by the simulation staff.  Then, the patient was set-up in a stable reproducible  supine position for radiation therapy.  CT images were obtained.  Surface markings were placed.  The CT images were loaded into the planning software.  Then the target and avoidance structures were contoured.  Treatment planning then occurred.  The radiation prescription was entered and confirmed.   I have requested : Brachytherapy Isodose Plan and Dosimetry Calculations to plan the radiation distribution.    PLAN:  The patient will receive 30 Gy in 5 fractions.  The patient will be treated with a 3 cm diameter segmented cylinder with a treatment length of 3 cm. Prescription will be 6 gray to the mucosal surface. Iridium 192 will be the high-dose-rate source.    ________________________________  Blair Promise, PhD, MD

## 2018-04-05 NOTE — Progress Notes (Signed)
  Radiation Oncology         (336) [phone] ________________________________  Name: Debra Miranda MRN: [id number]  Date: 04/05/2018  DOB: 08-08-1945  Vaginal Brachytherapy Procedure Note  CC: [name], DO Ronnie Doss M, DO    ICD-10-CM   1. Endometrial adenocarcinoma (HCC) C54.1     Diagnosis: FIGO grade 2, Stage IA, Endometrial adenocarcinoma ([place])    Narrative: She returns today for vaginal cylinder fitting. She denies any vaginal bleeding. She has been examined by Dr. Gerarda Fraction vaginal cuff healed well  ALLERGIES: is allergic to latex.  Meds: Current Outpatient Medications  Medication Sig Dispense Refill  . atorvastatin (LIPITOR) 20 MG tablet Take 1 tablet (20 mg total) by mouth daily. (Patient taking differently: Take 20 mg by mouth at bedtime. ) 90 tablet 3  . Calcium Carbonate-Vit D-Min (CALCIUM 1200 PO) Take 1 tablet by mouth at bedtime.     . Cholecalciferol (VITAMIN D) 2000 UNITS CAPS Take 2,000 Units by mouth at bedtime.     Marland Kitchen escitalopram (LEXAPRO) 10 MG tablet Take 1 tablet (10 mg total) by mouth daily. (Patient taking differently: Take 10 mg by mouth at bedtime. ) 90 tablet 3  . metFORMIN (GLUCOPHAGE XR) 500 MG 24 hr tablet Take 2 tablets (1,000 mg total) by mouth daily with breakfast. 180 tablet 1  . sitaGLIPtin (JANUVIA) 100 MG tablet Take 100 mg by mouth at bedtime.      No current facility-administered medications for this encounter.     Physical Findings: The patient is in no acute distress. Patient is alert and oriented.  height is 5\' 6"  (1.676 m) and weight is 189 lb 6 oz (85.9 kg). Her oral temperature is 98.4 F (36.9 C). Her blood pressure is 141/76 (abnormal) and her pulse is 80. Her respiration is 18 and oxygen saturation is 96%.   No palpable cervical, supraclavicular or axillary lymphoadenopathy. The heart has a regular rate and rhythm. The lungs are clear to auscultation. Abdomen soft and non-tender.  On pelvic examination the external  genitalia were unremarkable. A speculum exam was performed. Vaginal cuff intact, no mucosal lesions. On bimanual exam there were no pelvic masses appreciated.  Lab Findings: Lab Results  Component Value Date   WBC 6.4 03/08/2018   HGB 15.0 03/08/2018   HCT 43.7 03/08/2018   MCV 86 03/08/2018   PLT 133 (L) 03/08/2018    Radiographic Findings: No results found.  Impression: FIGO grade 2, Stage IA, Endometrial adenocarcinoma ([place])  Patient was fitted for a vaginal cylinder. The patient will be treated with a 3.0 cm diameter cylinder with a treatment length of 3.0 cm. This distended the vaginal vault without undue discomfort. The patient tolerated the procedure well.  The patient was successfully fitted for a vaginal cylinder. The patient is appropriate to begin vaginal brachytherapy.   Plan: The patient will proceed with CT simulation and vaginal brachytherapy today.    _______________________________   Blair Promise, PhD, MD

## 2018-04-05 NOTE — Progress Notes (Signed)
  Radiation Oncology         (336) [phone] ________________________________  Name: Debra Miranda MRN: [id number]  Date: 04/05/2018  DOB: 10-27-1945  CC: [name], DO  [name], DO  HDR BRACHYTHERAPY NOTE  DIAGNOSIS: FIGO grade 2, Stage IA, Endometrial adenocarcinoma (Debra Miranda)    Simple treatment device note: Patient had construction of her custom vaginal cylinder. She will be treated with a 3.0 cm diameter segmented cylinder. This conforms to her anatomy without undue discomfort.  Vaginal brachytherapy procedure node: The patient was brought to the [facility] suite. Identity was confirmed. All relevant records and images related to the planned course of therapy were reviewed. The patient freely provided informed written consent to proceed with treatment after reviewing the details related to the planned course of therapy. The consent form was witnessed and verified by the simulation staff. Then, the patient was set-up in a stable reproducible supine position for radiation therapy. Pelvic exam revealed the vaginal cuff to be intact . The patient's custom vaginal cylinder was placed in the proximal vagina. This was affixed to the CT/MR stabilization plate to prevent slippage. Patient tolerated the placement well.  Verification simulation note:  A fiducial marker was placed within the vaginal cylinder. An AP and lateral film was then obtained through the pelvis area. This documented accurate position of the vaginal cylinder for treatment.  HDR BRACHYTHERAPY TREATMENT  The remote afterloading device was affixed to the vaginal cylinder by catheter. Patient then proceeded to undergo her first high-dose-rate treatment directed at the proximal vagina. The patient was prescribed a dose of 6.0 gray to be delivered to the mucosal surface. Treatment length was 3.0 cm. Patient was treated with 1 channel using 7 dwell positions. Treatment time was 256.3 seconds. Iridium 192 was the high-dose-rate  source for treatment. The patient tolerated the treatment well. After completion of her therapy, a radiation survey was performed documenting return of the iridium source into the [employer] safe.   PLAN: patient will return next week for her second high-dose-rate treatment. ________________________________  Blair Promise, PhD, MD

## 2018-04-07 ENCOUNTER — Inpatient Hospital Stay (HOSPITAL_BASED_OUTPATIENT_CLINIC_OR_DEPARTMENT_OTHER): Payer: Medicare PPO | Admitting: Licensed Clinical Social Worker

## 2018-04-07 ENCOUNTER — Inpatient Hospital Stay: Payer: Medicare PPO

## 2018-04-07 ENCOUNTER — Encounter: Payer: Self-pay | Admitting: Licensed Clinical Social Worker

## 2018-04-07 DIAGNOSIS — Z800 Family history of malignant neoplasm of digestive organs: Secondary | ICD-10-CM

## 2018-04-07 DIAGNOSIS — Z8049 Family history of malignant neoplasm of other genital organs: Secondary | ICD-10-CM | POA: Diagnosis not present

## 2018-04-07 DIAGNOSIS — Z8051 Family history of malignant neoplasm of kidney: Secondary | ICD-10-CM | POA: Diagnosis not present

## 2018-04-07 DIAGNOSIS — C541 Malignant neoplasm of endometrium: Secondary | ICD-10-CM | POA: Diagnosis not present

## 2018-04-07 DIAGNOSIS — Z803 Family history of malignant neoplasm of breast: Secondary | ICD-10-CM

## 2018-04-07 NOTE — Progress Notes (Addendum)
REFERRING PROVIDER: Isabel Caprice, MD [place], [place] [address]  PRIMARY PROVIDER:  [name], DO  PRIMARY REASON FOR VISIT:  1. Endometrial adenocarcinoma ([place])   2. Family history of uterine cancer   3. Family history of pancreatic cancer   4. Family history of stomach cancer   5. Family history of kidney cancer   6. Family history of breast cancer      HISTORY OF PRESENT ILLNESS:   [name], a 73 y.o. female, was seen for a  cancer genetics consultation at the request of Dr. Gerarda Fraction due to her recent diagnosis of endometrial cancer and family history of cancer. Debra Miranda presents to clinic today to discuss the possibility of a hereditary predisposition to cancer, genetic testing, and to further clarify her future cancer risks, as well as potential cancer risks for family members.   In 2019, at the age of 29, Debra Miranda was diagnosed with endometrial cancer. Tumor testing revealed MMR IHC normal, MSI-Low. This was treated with TLH-BSO 02/08/18 and she is currently undergoing radiation.   Debra Miranda also has a history of skin cancer on her leg that was removed.   HORMONAL RISK FACTORS:  Menarche was at age 89.  First live birth at age 65.  OCP use for approximately 0 years.  Ovaries intact: no.  Hysterectomy: yes.  Menopausal status: postmenopausal.  HRT use: 0 years. Colonoscopy: yes; normal. She reports she has not had one in many years, uses Cologuard now.  Mammogram within the last year: no. Number of breast biopsies: 0.  Past Medical History:  Diagnosis Date  . Arthritis    right hand  . Cataract    RIGHT EYE; now removed  . Depression    many years ago, no meds  . Diabetes mellitus without complication ([place])    type 2  . Environmental allergies   . Family history of breast cancer   . Family history of kidney cancer   . Family history of pancreatic cancer   . Family history of stomach cancer   . Family history of uterine  cancer   . Hemorrhoids   . Orthostatic hypotension    Patient is unaware of this diagnosis  . Skin cancer 08/2017   LEFT LOWER LEG above foot, uterine cancer    Past Surgical History:  Procedure Laterality Date  . ABDOMINAL HYSTERECTOMY     Dr. Gerarda Fraction 02-08-18   . CATARACT EXTRACTION     RIGHT EYE  . HYSTEROSCOPY W/D&C N/A 01/05/2018   Procedure: DILATATION AND CURETTAGE/HYSTEROSCOPY;  Surgeon: Florian Buff, MD;  Location: [facility];  Service: Gynecology;  Laterality: N/A;  . LUMBAR [place] SURGERY  2006  . POLYPECTOMY N/A 01/05/2018   Procedure: ENDOMETRIAL POLYPECTOMY;  Surgeon: Florian Buff, MD;  Location: [facility];  Service: Gynecology;  Laterality: N/A;  . ROBOTIC ASSISTED TOTAL HYSTERECTOMY WITH BILATERAL SALPINGO OOPHERECTOMY Bilateral 02/08/2018   Procedure: XI ROBOTIC ASSISTED TOTAL HYSTERECTOMY WITH BILATERAL SALPINGO OOPHORECTOMY;  Surgeon: Isabel Caprice, MD;  Location: [facility];  Service: Gynecology;  Laterality: Bilateral;  . SENTINEL NODE BIOPSY Bilateral 02/08/2018   Procedure: SENTINEL NODE BIOPSY AND PELVIC LYMPH NODE SAMPLING;  Surgeon: Isabel Caprice, MD;  Location: [facility];  Service: Gynecology;  Laterality: Bilateral;  . TONSILLECTOMY     as a child    Social History   Socioeconomic History  . Marital status: Widowed    Spouse name: Not on file  . Number of  children: 1  . Years of education: 31  . Highest education level: Associate degree: occupational, Hotel manager, or vocational program  Occupational History  . Occupation: Retired    Comment: Data processing manager Needs  . Financial resource strain: Not hard at all  . Food insecurity:    Worry: Never true    Inability: Never true  . Transportation needs:    Medical: No    Non-medical: No  Tobacco Use  . Smoking status: Current Every Day Smoker    Years: 45.00    Types: Cigars    Last attempt to quit: 07/29/2010    Years since quitting: 7.6  . Smokeless tobacco: Never Used  . Tobacco comment: 4  small cigars a day  Substance and Sexual Activity  . Alcohol use: Yes    Comment: 1 every 6 months  . Drug use: No  . Sexual activity: Never  Lifestyle  . Physical activity:    Days per week: 7 days    Minutes per session: 120 min  . Stress: Only a little  Relationships  . Social connections:    Talks on phone: More than three times a week    Gets together: More than three times a week    Attends religious service: More than 4 times per year    Active member of club or organization: Yes    Attends meetings of clubs or organizations: Never    Relationship status: Married  Other Topics Concern  . Not on file  Social History Narrative  . Not on file     FAMILY HISTORY:  We obtained a detailed, 4-generation family history.  Significant diagnoses are listed below: Family History  Problem Relation Age of Onset  . Cervical cancer Mother 9       treated with radiation  . Pancreatic cancer Mother 43  . Stroke Father   . Pulmonary fibrosis Brother   . Alzheimer's disease Brother   . Stomach cancer Brother        dx 7s  . Diabetes Brother   . Lung cancer Maternal Grandmother   . Stroke Paternal Grandfather   . Uterine cancer Maternal Aunt        dx 7s  . Kidney cancer Maternal Uncle        dx 81s  . Colon cancer Maternal Uncle        dx 58s  . Breast cancer Cousin        maternal cousin dx 45    Ms. Pommier has one daughter, age 23. She had 3 brothers. One of her brothers had stomach cancer at 26 and died at 47. Another brother had pulmonary fibrosis and died at 40. Her other brother is living at 78 with no cancer history. No cancers for her nieces/nephews.  Ms. Maclaren mother had cervical cancer diagnosed at 17 and pancreatic cancer diagnosed at 45. She passed away at 14. Ms. Goldberger had 2 maternal uncles and 1 maternal aunt. Her aunt had uterine cancer diagnosed in her 75s, an uncle had colon cancer diagnosed in his 60s, and her other uncle had kidney cancer diagnosed in  his 31s. This uncle with kidney cancer had a daughter (patient's cousin) who had breast cancer at 13. Her maternal grandfather died in his 63s, maternal grandmother had lung cancer and she does not know how old she was.  Ms. Beretta father died at 4. He had 15 siblings, she believes 56 brothers and 4 sisters. The patient knows one of  her paternal uncles did have cancer but she is unsure of the type. She does not have a lot of information about her paternal cousins, but does believe there were cancers. Her paternal grandmother died at 20, she is unsure about how old her paternal grandfather was when he passed.  Ms. Berkheimer is unaware of previous family history of genetic testing for hereditary cancer risks. Patient's maternal ancestors are of European descent, and paternal ancestors are of European descent. There is no reported Ashkenazi Jewish ancestry. There is no known consanguinity.  GENETIC COUNSELING ASSESSMENT: SKYELER SCALESE is a 73 y.o. female with a personal and family history of cancer which is somewhat suggestive of a Hereditary Cancer Predisposition Syndrome. We, therefore, discussed and recommended the following at today's visit.   DISCUSSION: We discussed that 5-10% of all cancer cases are hereditary. We discussed Lynch syndrome. This syndrome increases the risk for colon, uterine, ovarian and stomach cancers, brain cancers, as well as others.  Families with Lynch Syndrome tend to have multiple family members with these cancers, typically diagnosed under age 71, and diagnoses in multiple generations. The genes that are known to cause Lynch Syndrome are called MLH1, MSH2, MSH6, PMS2 and EPCAM.  We discussed that there are several other genes that are associated with an increased risk for endometrial cancer. We also dicussed that there are many genes that cause many different types of cancer risks.    We reviewed the characteristics, features and inheritance patterns of hereditary cancer  syndromes. We also discussed genetic testing, including the appropriate family members to test, the process of testing, insurance coverage and turn-around-time for results. We discussed the implications of a negative, positive and/or variant of uncertain significant result. We recommended Ms. Doi pursue genetic testing for the [employer].  The Multi-Cancer Panel offered by [facility] includes sequencing and/or deletion duplication testing of the following 84 genes: AIP, ALK, APC, ATM, AXIN2,BAP1,  BARD1, BLM, BMPR1A, BRCA1, BRCA2, BRIP1, CASR, CDC73, CDH1, CDK4, CDKN1B, CDKN1C, CDKN2A (p14ARF), CDKN2A (p16INK4a), CEBPA, CHEK2, CTNNA1, DICER1, DIS3L2, EGFR (c.2369C>T, p.Thr790Met variant only), EPCAM (Deletion/duplication testing only), FH, FLCN, GATA2, GPC3, GREM1 (Promoter region deletion/duplication testing only), HOXB13 (c.251G>A, p.Gly84Glu), HRAS, KIT, MAX, MEN1, MET, MITF (c.952G>A, p.Glu318Lys variant only), MLH1, MSH2, MSH3, MSH6, MUTYH, NBN, NF1, NF2, NTHL1, PALB2, PDGFRA, PHOX2B, PMS2, POLD1, POLE, POT1, PRKAR1A, PTCH1, PTEN, RAD50, RAD51C, RAD51D, RB1, RECQL4, RET, RUNX1, SDHAF2, SDHA (sequence changes only), SDHB, SDHC, SDHD, SMAD4, SMARCA4, SMARCB1, SMARCE1, STK11, SUFU, TERC, TERT, TMEM127, TP53, TSC1, TSC2, VHL, WRN and WT1.    We discussed that if she is found to have a mutation in one of these genes, it may impact future medical management recommendations such as increased cancer screenings and consideration of risk reducing surgeries.  A positive result could also have implications for the patient's family members.  A Negative result would mean we were unable to identify a hereditary component to her cancer, but does not rule out the possibility of a hereditary basis for her endometrial cancer. There could be mutations that are undetectable by current technology, or in genes not yet tested or identified to increase cancer risk.    We discussed the potential to find a Variant  of Uncertain Significance or VUS.  These are variants that have not yet been identified as pathogenic or benign, and it is unknown if this variant is associated with increased cancer risk or if this is a normal finding.  Most VUS's are reclassified to benign or likely benign.  It should not be used to make medical management decisions. With time, we suspect the lab will determine the significance of any VUS's identified if any.   Based on Ms. Nesmith's personal and family history of cancer, she meets NCCN medical criteria for genetic testing. Despite that she meets criteria, she may still have an out of pocket cost. The lab will call her with an OOP if any.  PLAN: After considering the risks, benefits, and limitations, Ms. Monestime  provided informed consent to pursue genetic testing and the blood sample was sent to [facility] for analysis of the Multi-Cancer Panel. Results should be available within approximately 2-3 weeks' time, at which point they will be disclosed by telephone to Ms. Base, as will any additional recommendations warranted by these results. Ms. Aylesworth will receive a summary of her genetic counseling visit and a copy of her results once available. This information will also be available in Epic.   Lastly, we encouraged Ms. Gragert to remain in contact with cancer genetics annually so that we can continuously update the family history and inform her of any changes in cancer genetics and testing that may be of benefit for this family.   Ms.  Olveda questions were answered to her satisfaction today. Our contact information was provided should additional questions or concerns arise. Thank you for the referral and allowing [place] to share in the care of your patient.   Faith Rogue, MS Genetic Counselor [email or web address] Phone: [phone]  The patient was seen for a total of 35 minutes in face-to-face genetic counseling.  This patient was discussed with  Drs.Magrinat, Lindi Adie and/or Burr Medico who agrees with the above.

## 2018-04-12 ENCOUNTER — Other Ambulatory Visit: Payer: Self-pay

## 2018-04-12 ENCOUNTER — Inpatient Hospital Stay (HOSPITAL_BASED_OUTPATIENT_CLINIC_OR_DEPARTMENT_OTHER): Payer: Medicare PPO | Admitting: Gynecology

## 2018-04-12 ENCOUNTER — Encounter: Payer: Self-pay | Admitting: Gynecology

## 2018-04-12 ENCOUNTER — Ambulatory Visit
Admission: RE | Admit: 2018-04-12 | Discharge: 2018-04-12 | Disposition: A | Discharge status: Home / Self Care | Payer: Medicare PPO | Source: Ambulatory Visit | Attending: Radiation Oncology | Admitting: Radiation Oncology

## 2018-04-12 VITALS — BP 144/84 | HR 76 | Temp 98.2°F | Resp 18 | Ht 67.0 in | Wt 188.0 lb

## 2018-04-12 DIAGNOSIS — Z85828 Personal history of other malignant neoplasm of skin: Secondary | ICD-10-CM | POA: Diagnosis not present

## 2018-04-12 DIAGNOSIS — F17290 Nicotine dependence, other tobacco product, uncomplicated: Secondary | ICD-10-CM | POA: Diagnosis not present

## 2018-04-12 DIAGNOSIS — Z923 Personal history of irradiation: Secondary | ICD-10-CM | POA: Diagnosis not present

## 2018-04-12 DIAGNOSIS — C541 Malignant neoplasm of endometrium: Secondary | ICD-10-CM

## 2018-04-12 DIAGNOSIS — N938 Other specified abnormal uterine and vaginal bleeding: Secondary | ICD-10-CM | POA: Diagnosis not present

## 2018-04-12 DIAGNOSIS — Z90710 Acquired absence of both cervix and uterus: Secondary | ICD-10-CM | POA: Diagnosis not present

## 2018-04-12 DIAGNOSIS — Z79899 Other long term (current) drug therapy: Secondary | ICD-10-CM | POA: Diagnosis not present

## 2018-04-12 DIAGNOSIS — E119 Type 2 diabetes mellitus without complications: Secondary | ICD-10-CM | POA: Diagnosis not present

## 2018-04-12 DIAGNOSIS — Z510 Encounter for antineoplastic radiation therapy: Secondary | ICD-10-CM | POA: Diagnosis not present

## 2018-04-12 DIAGNOSIS — Z90722 Acquired absence of ovaries, bilateral: Secondary | ICD-10-CM | POA: Diagnosis not present

## 2018-04-12 NOTE — Patient Instructions (Signed)
Continue follow up with Dr. Sondra Come.

## 2018-04-12 NOTE — Progress Notes (Signed)
Consult Note: Gyn-Onc   Debra Miranda 73 y.o. female  Chief Complaint  Patient presents with  . Endometrial adenocarcinoma (HCC)    Assessment : Stage Ia grade 2 endometrioid endometrial adenocarcinoma.  Patient has deep myometrial invasion and LVSI currently undergoing vaginal brachii therapy.  Recent episode of bleeding of uncertain etiology but most likely from the still healing vaginal cuff.  Plan: Patient is reassured regarding her findings.  She will go ahead and complete vaginal brachii therapy.  She return to see [place] in 2 months for routine surveillance.  Interval History: Patient is seen as an add-on visit today given a recent episode of bleeding following first vaginal brachii therapy.  The bleeding lasted 1 day last week.  Subsequently she has had no subsequent bleeding.  She denies any other pelvic symptoms especially pain.  She has no other GI or GU symptoms.  She seems to have tolerated the first episode of vaginal brachy therapy well.  HPI: Patient initially presented with pain and ultrasound showed a 11 mm endometrial stripe.  She then had a D&C with a polyp returning is a grade 2 endometrioid adenocarcinoma.  She underwent robotic assisted laparoscopic hysterectomy bilateral salpingo-oophorectomy and sentinel node biopsy with washings on February 08, 2018.  Final pathology showed that the sentinel nodes were negative and the tumor was only superficially invasive.   Due to the presence of grade 2 disease patient underwent vaginal brachii therapy.  Review of systems is negative except as noted in interval history.   Vitals: Blood pressure (!) 144/84, pulse 76, temperature 98.2 F (36.8 C), temperature source Oral, resp. rate 18, height 5\' 7"  (1.702 m), weight 188 lb (85.3 kg), SpO2 97 %.  Physical Exam: General : The patient is a healthy woman in no acute distress.  HEENT: normocephalic, extraoccular movements normal; neck is supple without thyromegally  Lynphnodes:  Supraclavicular and inguinal nodes not enlarged  Abdomen: Soft, non-tender, no ascites, no organomegally, no masses, no hernias  Pelvic:  EGBUS: Normal female  Vagina: Normal, no lesions.  There are several Vicryl sutures at the vaginal apex and a slight area of separation which is not bleeding today. Urethra and Bladder: Normal, non-tender  Cervix: Surgically absent  Uterus: Surgically absent  Bi-manual examination: Non-tender; no adenxal masses or nodularity  Rectal: Deferred Lower extremities: No edema or varicosities. Normal range of motion      Allergies  Allergen Reactions  . Latex Itching    Past Medical History:  Diagnosis Date  . Arthritis    right hand  . Cataract    RIGHT EYE; now removed  . Depression    many years ago, no meds  . Diabetes mellitus without complication ([place])    type 2  . Environmental allergies   . Family history of breast cancer   . Family history of kidney cancer   . Family history of pancreatic cancer   . Family history of stomach cancer   . Family history of uterine cancer   . Hemorrhoids   . Orthostatic hypotension    Patient is unaware of this diagnosis  . Skin cancer 08/2017   LEFT LOWER LEG above foot, uterine cancer    Past Surgical History:  Procedure Laterality Date  . ABDOMINAL HYSTERECTOMY     Dr. Gerarda Fraction 02-08-18   . CATARACT EXTRACTION     RIGHT EYE  . HYSTEROSCOPY W/D&C N/A 01/05/2018   Procedure: DILATATION AND CURETTAGE/HYSTEROSCOPY;  Surgeon: Florian Buff, MD;  Location: [facility];  Service: Gynecology;  Laterality: N/A;  . LUMBAR [place] SURGERY  2006  . POLYPECTOMY N/A 01/05/2018   Procedure: ENDOMETRIAL POLYPECTOMY;  Surgeon: Florian Buff, MD;  Location: [facility];  Service: Gynecology;  Laterality: N/A;  . ROBOTIC ASSISTED TOTAL HYSTERECTOMY WITH BILATERAL SALPINGO OOPHERECTOMY Bilateral 02/08/2018   Procedure: XI ROBOTIC ASSISTED TOTAL HYSTERECTOMY WITH BILATERAL SALPINGO OOPHORECTOMY;  Surgeon: Isabel Caprice,  MD;  Location: [facility];  Service: Gynecology;  Laterality: Bilateral;  . SENTINEL NODE BIOPSY Bilateral 02/08/2018   Procedure: SENTINEL NODE BIOPSY AND PELVIC LYMPH NODE SAMPLING;  Surgeon: Isabel Caprice, MD;  Location: [facility];  Service: Gynecology;  Laterality: Bilateral;  . TONSILLECTOMY     as a child    Current Outpatient Medications  Medication Sig Dispense Refill  . atorvastatin (LIPITOR) 20 MG tablet Take 1 tablet (20 mg total) by mouth daily. (Patient taking differently: Take 20 mg by mouth at bedtime. ) 90 tablet 3  . Calcium Carbonate-Vit D-Min (CALCIUM 1200 PO) Take 1 tablet by mouth at bedtime.     . Cholecalciferol (VITAMIN D) 2000 UNITS CAPS Take 2,000 Units by mouth at bedtime.     Marland Kitchen escitalopram (LEXAPRO) 10 MG tablet Take 1 tablet (10 mg total) by mouth daily. (Patient taking differently: Take 10 mg by mouth at bedtime. ) 90 tablet 3  . metFORMIN (GLUCOPHAGE XR) 500 MG 24 hr tablet Take 2 tablets (1,000 mg total) by mouth daily with breakfast. 180 tablet 1  . sitaGLIPtin (JANUVIA) 100 MG tablet Take 100 mg by mouth at bedtime.      No current facility-administered medications for this visit.     Social History   Socioeconomic History  . Marital status: Widowed    Spouse name: Not on file  . Number of children: 1  . Years of education: 9  . Highest education level: Associate degree: occupational, Hotel manager, or vocational program  Occupational History  . Occupation: Retired    Comment: Data processing manager Needs  . Financial resource strain: Not hard at all  . Food insecurity:    Worry: Never true    Inability: Never true  . Transportation needs:    Medical: No    Non-medical: No  Tobacco Use  . Smoking status: Current Every Day Smoker    Years: 45.00    Types: Cigars    Last attempt to quit: 07/29/2010    Years since quitting: 7.7  . Smokeless tobacco: Never Used  . Tobacco comment: 4 small cigars a day  Substance and Sexual Activity  . Alcohol  use: Yes    Comment: 1 every 6 months  . Drug use: No  . Sexual activity: Never  Lifestyle  . Physical activity:    Days per week: 7 days    Minutes per session: 120 min  . Stress: Only a little  Relationships  . Social connections:    Talks on phone: More than three times a week    Gets together: More than three times a week    Attends religious service: More than 4 times per year    Active member of club or organization: Yes    Attends meetings of clubs or organizations: Never    Relationship status: Married  . Intimate partner violence:    Fear of current or ex partner: No    Emotionally abused: No    Physically abused: No    Forced sexual activity: Not on file  Other Topics Concern  . Not  on file  Social History Narrative  . Not on file    Family History  Problem Relation Age of Onset  . Cervical cancer Mother 83       treated with radiation  . Pancreatic cancer Mother 37  . Stroke Father   . Pulmonary fibrosis Brother   . Alzheimer's disease Brother   . Stomach cancer Brother        dx 34s  . Diabetes Brother   . Lung cancer Maternal Grandmother   . Stroke Paternal Grandfather   . Uterine cancer Maternal Aunt        dx 10s  . Kidney cancer Maternal Uncle        dx 54s  . Colon cancer Maternal Uncle        dx 49s  . Breast cancer Cousin        maternal cousin dx 30      Marti Sleigh, MD 04/12/2018, 2:11 PM

## 2018-04-12 NOTE — Progress Notes (Signed)
  Radiation Oncology         [phone] ________________________________  Name: Debra Miranda MRN: [id number]  Date: 04/12/2018  DOB: 10-20-1945  CC: [name], DO  [name], DO  HDR BRACHYTHERAPY NOTE  DIAGNOSIS: FIGO grade 2, Stage IA,Endometrial adenocarcinoma ([place])    Simple treatment device note: Patient had construction of her custom vaginal cylinder. She will be treated with a 3.0 cm diameter segmented cylinder. This conforms to her anatomy without undue discomfort.  Vaginal brachytherapy procedure node: The patient was brought to the [place] suite. Identity was confirmed. All relevant records and images related to the planned course of therapy were reviewed. The patient freely provided informed written consent to proceed with treatment after reviewing the details related to the planned course of therapy. The consent form was witnessed and verified by the simulation staff. Then, the patient was set-up in a stable reproducible supine position for radiation therapy. Pelvic exam revealed the vaginal cuff to be intact Vicryl sutures present. The patient's custom vaginal cylinder was placed in the proximal vagina. This was affixed to the CT/MR stabilization plate to prevent slippage. Patient tolerated the placement well.  Verification simulation note:  A fiducial marker was placed within the vaginal cylinder. An AP and lateral film was then obtained through the pelvis area. This documented accurate position of the vaginal cylinder for treatment.  HDR BRACHYTHERAPY TREATMENT  The remote afterloading device was affixed to the vaginal cylinder by catheter. Patient then proceeded to undergo her second high-dose-rate treatment directed at the proximal vagina. The patient was prescribed a dose of 6.0 gray to be delivered to the mucosal surface. Treatment length was 3.0 cm. Patient was treated with 1 channel using 7 dwell positions. Treatment time was 273.9 seconds. Iridium 192  was the high-dose-rate source for treatment. The patient tolerated the treatment well. After completion of her therapy, a radiation survey was performed documenting return of the iridium source into the GammaMed safe.   PLAN: patient will return next week for her third high-dose-rate treatment. In light of her bleeding after her first brachytherapy procedure the patient was referred to gynecologic oncology. The patient was seen today by Dr. Fermin Schwab and he felt the patient could continue with her brachytherapy as planned. ________________________________  Blair Promise, PhD, MD

## 2018-04-18 ENCOUNTER — Telehealth: Payer: Self-pay | Admitting: *Deleted

## 2018-04-18 DIAGNOSIS — L570 Actinic keratosis: Secondary | ICD-10-CM | POA: Diagnosis not present

## 2018-04-18 DIAGNOSIS — Z85828 Personal history of other malignant neoplasm of skin: Secondary | ICD-10-CM | POA: Diagnosis not present

## 2018-04-18 DIAGNOSIS — L819 Disorder of pigmentation, unspecified: Secondary | ICD-10-CM | POA: Diagnosis not present

## 2018-04-18 NOTE — Telephone Encounter (Signed)
CALLED PATIENT TO REMIND OF HDR TX. FOR 04-19-18, LVM FOR A RETURN CALL

## 2018-04-19 ENCOUNTER — Ambulatory Visit
Admission: RE | Admit: 2018-04-19 | Discharge: 2018-04-19 | Disposition: A | Discharge status: Home / Self Care | Payer: Medicare PPO | Source: Ambulatory Visit | Attending: Radiation Oncology | Admitting: Radiation Oncology

## 2018-04-19 DIAGNOSIS — Z85828 Personal history of other malignant neoplasm of skin: Secondary | ICD-10-CM | POA: Diagnosis not present

## 2018-04-19 DIAGNOSIS — Z510 Encounter for antineoplastic radiation therapy: Secondary | ICD-10-CM | POA: Insufficient documentation

## 2018-04-19 DIAGNOSIS — E119 Type 2 diabetes mellitus without complications: Secondary | ICD-10-CM | POA: Diagnosis not present

## 2018-04-19 DIAGNOSIS — C541 Malignant neoplasm of endometrium: Secondary | ICD-10-CM | POA: Insufficient documentation

## 2018-04-19 DIAGNOSIS — F17290 Nicotine dependence, other tobacco product, uncomplicated: Secondary | ICD-10-CM | POA: Diagnosis not present

## 2018-04-19 DIAGNOSIS — C55 Malignant neoplasm of uterus, part unspecified: Secondary | ICD-10-CM

## 2018-04-19 DIAGNOSIS — Z79899 Other long term (current) drug therapy: Secondary | ICD-10-CM | POA: Insufficient documentation

## 2018-04-19 NOTE — Progress Notes (Signed)
  Radiation Oncology         [phone] ________________________________  Name: Debra Miranda MRN: [id number]  Date: 04/19/2018  DOB: Dec 20, 1945  CC: [name], DO  [name], DO  HDR BRACHYTHERAPY NOTE  DIAGNOSIS: FIGO grade 2, Stage IA,Endometrial adenocarcinoma ([place])    Simple treatment device note: Patient had construction of her custom vaginal cylinder. She will be treated with a 3.0 cm diameter segmented cylinder. This conforms to her anatomy without undue discomfort.  Vaginal brachytherapy procedure node: The patient was brought to the [place] suite. Identity was confirmed. All relevant records and images related to the planned course of therapy were reviewed. The patient freely provided informed written consent to proceed with treatment after reviewing the details related to the planned course of therapy. The consent form was witnessed and verified by the simulation staff. Then, the patient was set-up in a stable reproducible supine position for radiation therapy. Pelvic exam revealed the vaginal cuff to be intact Vicryl sutures present. The patient's custom vaginal cylinder was placed in the proximal vagina. This was affixed to the CT/MR stabilization plate to prevent slippage. Patient tolerated the placement well.  Verification simulation note:  A fiducial marker was placed within the vaginal cylinder. An AP and lateral film was then obtained through the pelvis area. This documented accurate position of the vaginal cylinder for treatment.  HDR BRACHYTHERAPY TREATMENT  The remote afterloading device was affixed to the vaginal cylinder by catheter. Patient then proceeded to undergo her third high-dose-rate treatment directed at the proximal vagina. The patient was prescribed a dose of 6.0 gray to be delivered to the mucosal surface. Treatment length was 3.0 cm. Patient was treated with 1 channel using 7 dwell positions. Treatment time was 295.5 seconds. Iridium 192 was  the high-dose-rate source for treatment. The patient tolerated the treatment well. After completion of her therapy, a radiation survey was performed documenting return of the iridium source into the GammaMed safe.   PLAN: patient will return next week for her fourth high-dose-rate treatment. She reports no further vaginal bleeding ________________________________  Blair Promise, PhD, MD

## 2018-04-21 ENCOUNTER — Telehealth: Payer: Self-pay | Admitting: Licensed Clinical Social Worker

## 2018-04-21 ENCOUNTER — Ambulatory Visit: Payer: Self-pay | Admitting: Licensed Clinical Social Worker

## 2018-04-21 ENCOUNTER — Encounter: Payer: Self-pay | Admitting: Licensed Clinical Social Worker

## 2018-04-21 DIAGNOSIS — Z1379 Encounter for other screening for genetic and chromosomal anomalies: Secondary | ICD-10-CM

## 2018-04-21 NOTE — Progress Notes (Signed)
HPI:  [name] was previously seen in the [facility] on 04/07/2018 due to a personal and family  history of cancer and concerns regarding a hereditary predisposition to cancer. Please refer to our prior cancer genetics clinic note for more information regarding [name]'s medical, social and family histories, and our assessment and recommendations, at the time. [name]'s recent genetic test results were disclosed to her, as well as recommendations warranted by these results. These results and recommendations are discussed in more detail below.   In 2019, at the age of 73, [name] was diagnosed with endometrial cancer. Tumor testing revealed MMR IHC normal, MSI-Low. This was treated with TLH-BSO 02/08/18 and she is currently undergoing radiation.   [name] also has a history of skin cancer on her leg that was removed.  FAMILY HISTORY:  We obtained a detailed, 4-generation family history.  Significant diagnoses are listed below: Family History  Problem Relation Age of Onset  . Cervical cancer Mother 62       treated with radiation  . Pancreatic cancer Mother 75  . Stroke Father   . Pulmonary fibrosis Brother   . Alzheimer's disease Brother   . Stomach cancer Brother        dx 70s  . Diabetes Brother   . Lung cancer Maternal Grandmother   . Stroke Paternal Grandfather   . Uterine cancer Maternal Aunt        dx 70s  . Kidney cancer Maternal Uncle        dx 70s  . Colon cancer Maternal Uncle        dx 70s  . Breast cancer Cousin        maternal cousin dx 70   [name] has one daughter, age 42. She had 3 brothers. One of her brothers had stomach cancer at 70 and died at 77. Another brother had pulmonary fibrosis and died at 68. Her other brother is living at 73 with no cancer history. No cancers for her nieces/nephews.  [name] mother had cervical cancer diagnosed at 62 and pancreatic cancer diagnosed at 75. She passed away at 75. [name] had 2  maternal uncles and 1 maternal aunt. Her aunt had uterine cancer diagnosed in her 70s, an uncle had colon cancer diagnosed in his 70s, and her other uncle had kidney cancer diagnosed in his 70s. This uncle with kidney cancer had a daughter (patient's cousin) who had breast cancer at 70. Her maternal grandfather died in his 80s, maternal grandmother had lung cancer and she does not know how old she was.  [name] father died at 84. He had 15 siblings, she believes 11 brothers and 4 sisters. The patient knows one of her paternal uncles did have cancer but she is unsure of the type. She does not have a lot of information about her paternal cousins, but does believe there were cancers. Her paternal grandmother died at 93, she is unsure about how old her paternal grandfather was when he passed.  [name] is unaware of previous family history of genetic testing for hereditary cancer risks. Patient's maternal ancestors are of European descent, and paternal ancestors are of European descent. There is no reported Ashkenazi Jewish ancestry. There is no known consanguinity.  GENETIC TEST RESULTS: Genetic testing performed through [facility]'s Multi-Cancer Panel reported out on 04/21/2018 showed no pathogenic mutations. The Multi-Cancer Panel offered by [facility] includes sequencing and/or deletion duplication testing of the following 84 genes: AIP, ALK,   APC, ATM, AXIN2,BAP1,  BARD1, BLM, BMPR1A, BRCA1, BRCA2, BRIP1, CASR, CDC73, CDH1, CDK4, CDKN1B, CDKN1C, CDKN2A (p14ARF), CDKN2A (p16INK4a), CEBPA, CHEK2, CTNNA1, DICER1, DIS3L2, EGFR (c.2369C>T, p.Thr790Met variant only), EPCAM (Deletion/duplication testing only), FH, FLCN, GATA2, GPC3, GREM1 (Promoter region deletion/duplication testing only), HOXB13 (c.251G>A, p.Gly84Glu), HRAS, KIT, MAX, MEN1, MET, MITF (c.952G>A, p.Glu318Lys variant only), MLH1, MSH2, MSH3, MSH6, MUTYH, NBN, NF1, NF2, NTHL1, PALB2, PDGFRA, PHOX2B, PMS2, POLD1, POLE, POT1, PRKAR1A, PTCH1, PTEN,  RAD50, RAD51C, RAD51D, RB1, RECQL4, RET, RUNX1, SDHAF2, SDHA (sequence changes only), SDHB, SDHC, SDHD, SMAD4, SMARCA4, SMARCB1, SMARCE1, STK11, SUFU, TERC, TERT, TMEM127, TP53, TSC1, TSC2, VHL, WRN and WT1.  The test report will be scanned into EPIC and will be located under the Molecular Pathology section of the Results Review tab. A portion of the result report is included below for reference.     We discussed with [name] that because current genetic testing is not perfect, it is possible there may be a gene mutation in one of these genes that current testing cannot detect, but that chance is small.  We also discussed, that there could be another gene that has not yet been discovered, or that we have not yet tested, that is responsible for the cancer diagnoses in the family. It is also possible there is a hereditary cause for the cancer in the family that [name] did not inherit and therefore was not identified in her testing.  Therefore, it is important to remain in touch with cancer genetics in the future so that we can continue to offer [name] the most up to date genetic testing.   ADDITIONAL GENETIC TESTING: We discussed with [name] that her genetic testing was fairly extensive.  If there are are genes identified to increase cancer risk that can be analyzed in the future, we would be happy to discuss and coordinate this testing at that time.    CANCER SCREENING RECOMMENDATIONS: [name]'s test result is considered negative (normal).  This means that we have not identified a hereditary cause for her personal and family history of cancer at this time.   While reassuring, this does not definitively rule out a hereditary predisposition to cancer. It is still possible that there could be genetic mutations that are undetectable by current technology, or genetic mutations in genes that have not been tested or identified to increase cancer risk.  Therefore, it is recommended she continue  to follow the cancer management and screening guidelines provided by her oncology and primary healthcare provider. An individual's cancer risk is not determined by genetic test results alone.  Overall cancer risk assessment includes additional factors such as personal medical history, family history, etc.  These should be used to make a personalized plan for cancer prevention and surveillance.    RECOMMENDATIONS FOR FAMILY MEMBERS:  Relatives in this family might be at some increased risk of developing cancer, over the general population risk, simply due to the family history of cancer.  We recommended women in this family have a yearly mammogram beginning at age 40, or 10 years younger than the earliest onset of cancer, an annual clinical breast exam, and perform monthly breast self-exams. Women in this family should also have a gynecological exam as recommended by their primary provider. All family members should have a colonoscopy as directed by their doctors.  All family members should inform their physicians about the family history of cancer so their doctors can make the most appropriate screening recommendations for them.   It   is also possible there is a hereditary cause for the cancer in [name] family that she did not inherit and therefore was not identified in her.  We recommended her brother/maternal relatives, have genetic counseling and testing. [name] will let us know if we can be of any assistance in coordinating genetic counseling and/or testing for these family members.   FOLLOW-UP: Lastly, we discussed with [name] that cancer genetics is a rapidly advancing field and it is possible that new genetic tests will be appropriate for her and/or her family members in the future. We encouraged her to remain in contact with cancer genetics on an annual basis so we can update her personal and family histories and let her know of advances in cancer genetics that may benefit this family.    Our contact number was provided. [name]'s questions were answered to her satisfaction, and she knows she is welcome to call us at anytime with additional questions or concerns.  Brianna Cowan, MS Genetic Counselor [email or web address] Phone: [phone]    

## 2018-04-21 NOTE — Telephone Encounter (Signed)
Revealed negative genetic testing.  We discussed that we do not know why she has cancer or why there is cancer in the family. It could be due to a different gene that we are not testing, or something our current technology cannot pick up.  It will be important for her to keep in contact with genetics to learn if additional testing may be needed in the future.  

## 2018-04-25 ENCOUNTER — Telehealth: Payer: Self-pay | Admitting: *Deleted

## 2018-04-25 NOTE — Telephone Encounter (Signed)
CALLED PATIENT TO REMIND OF HDR [place] FOR 04-26-18 @ 10 AM, SPOKE WITH PATIENT AND SHE IS AWARE OF THIS [place].

## 2018-04-26 ENCOUNTER — Telehealth: Payer: Self-pay | Admitting: *Deleted

## 2018-04-26 ENCOUNTER — Ambulatory Visit
Admission: RE | Admit: 2018-04-26 | Discharge: 2018-04-26 | Disposition: A | Discharge status: Home / Self Care | Payer: Medicare PPO | Source: Ambulatory Visit | Attending: Radiation Oncology | Admitting: Radiation Oncology

## 2018-04-26 DIAGNOSIS — Z79899 Other long term (current) drug therapy: Secondary | ICD-10-CM | POA: Diagnosis not present

## 2018-04-26 DIAGNOSIS — F17290 Nicotine dependence, other tobacco product, uncomplicated: Secondary | ICD-10-CM | POA: Diagnosis not present

## 2018-04-26 DIAGNOSIS — Z85828 Personal history of other malignant neoplasm of skin: Secondary | ICD-10-CM | POA: Diagnosis not present

## 2018-04-26 DIAGNOSIS — E119 Type 2 diabetes mellitus without complications: Secondary | ICD-10-CM | POA: Diagnosis not present

## 2018-04-26 DIAGNOSIS — C541 Malignant neoplasm of endometrium: Secondary | ICD-10-CM | POA: Diagnosis not present

## 2018-04-26 DIAGNOSIS — Z510 Encounter for antineoplastic radiation therapy: Secondary | ICD-10-CM | POA: Diagnosis not present

## 2018-04-26 NOTE — Telephone Encounter (Signed)
Per request from [facility] I have faxed her genetics and MSI testing results

## 2018-04-26 NOTE — Progress Notes (Signed)
  Radiation Oncology         [phone] ________________________________  Name: Debra Miranda MRN: [id number]  Date: 04/26/2018  DOB: 05-27-1945  CC: [name], DO  [name], DO  HDR BRACHYTHERAPY NOTE  DIAGNOSIS: FIGO grade 2, Stage IA,Endometrial adenocarcinoma ([place])    Simple treatment device note: Patient had construction of her custom vaginal cylinder. She will be treated with a 3.0 cm diameter segmented cylinder. This conforms to her anatomy without undue discomfort.  Vaginal brachytherapy procedure node: The patient was brought to the [place] suite. Identity was confirmed. All relevant records and images related to the planned course of therapy were reviewed. The patient freely provided informed written consent to proceed with treatment after reviewing the details related to the planned course of therapy. The consent form was witnessed and verified by the simulation staff. Then, the patient was set-up in a stable reproducible supine position for radiation therapy. Pelvic exam revealed the vaginal cuff to be intact Vicryl sutures present. The patient's custom vaginal cylinder was placed in the proximal vagina. This was affixed to the CT/MR stabilization plate to prevent slippage. Patient tolerated the placement well.  Verification simulation note:  A fiducial marker was placed within the vaginal cylinder. An AP and lateral film was then obtained through the pelvis area. This documented accurate position of the vaginal cylinder for treatment.  HDR BRACHYTHERAPY TREATMENT  The remote afterloading device was affixed to the vaginal cylinder by catheter. Patient then proceeded to undergo her fourth high-dose-rate treatment directed at the proximal vagina. The patient was prescribed a dose of 6.0 gray to be delivered to the mucosal surface. Treatment length was 3.0 cm. Patient was treated with 1 channel using 7 dwell positions. Treatment time was 312.2 seconds. Iridium 192  was the high-dose-rate source for treatment. The patient tolerated the treatment well. After completion of her therapy, a radiation survey was performed documenting return of the iridium source into the GammaMed safe.   PLAN: patient will return next week for her fifth and final high-dose-rate treatment.  ________________________________  Blair Promise, PhD, MD

## 2018-05-03 ENCOUNTER — Telehealth: Payer: Self-pay | Admitting: *Deleted

## 2018-05-03 ENCOUNTER — Ambulatory Visit: Payer: Medicare PPO | Admitting: Radiation Oncology

## 2018-05-03 NOTE — Telephone Encounter (Signed)
Called patient to remind of HDR Tx. for 05-04-18 @ 2 pm, lvm for a return call

## 2018-05-04 ENCOUNTER — Other Ambulatory Visit: Payer: Self-pay

## 2018-05-04 ENCOUNTER — Ambulatory Visit
Admission: RE | Admit: 2018-05-04 | Discharge: 2018-05-04 | Disposition: A | Discharge status: Home / Self Care | Payer: Medicare PPO | Source: Ambulatory Visit | Attending: Radiation Oncology | Admitting: Radiation Oncology

## 2018-05-04 DIAGNOSIS — E119 Type 2 diabetes mellitus without complications: Secondary | ICD-10-CM | POA: Diagnosis not present

## 2018-05-04 DIAGNOSIS — Z79899 Other long term (current) drug therapy: Secondary | ICD-10-CM | POA: Diagnosis not present

## 2018-05-04 DIAGNOSIS — F17290 Nicotine dependence, other tobacco product, uncomplicated: Secondary | ICD-10-CM | POA: Diagnosis not present

## 2018-05-04 DIAGNOSIS — Z85828 Personal history of other malignant neoplasm of skin: Secondary | ICD-10-CM | POA: Diagnosis not present

## 2018-05-04 DIAGNOSIS — Z510 Encounter for antineoplastic radiation therapy: Secondary | ICD-10-CM | POA: Diagnosis not present

## 2018-05-04 DIAGNOSIS — C541 Malignant neoplasm of endometrium: Secondary | ICD-10-CM

## 2018-05-04 NOTE — Progress Notes (Signed)
  Radiation Oncology         [phone]) [phone] ________________________________  Name: Debra Miranda MRN: [id number]  Date: 05/04/2018  DOB: April 26, 1945  CC: [name], DO  [name], DO  HDR BRACHYTHERAPY NOTE  DIAGNOSIS: FIGO grade 2, Stage IA,Endometrial adenocarcinoma ([place])    Simple treatment device note: Patient had construction of her custom vaginal cylinder. She will be treated with a 3.0 cm diameter segmented cylinder. This conforms to her anatomy without undue discomfort.  Vaginal brachytherapy procedure node: The patient was brought to the [facility] suite. Identity was confirmed. All relevant records and images related to the planned course of therapy were reviewed. The patient freely provided informed written consent to proceed with treatment after reviewing the details related to the planned course of therapy. The consent form was witnessed and verified by the simulation staff. Then, the patient was set-up in a stable reproducible supine position for radiation therapy. Pelvic exam revealed the vaginal cuff to be intact Vicryl sutures present. The patient's custom vaginal cylinder was placed in the proximal vagina. This was affixed to the CT/MR stabilization plate to prevent slippage. Patient tolerated the placement well.  Verification simulation note:  A fiducial marker was placed within the vaginal cylinder. An AP and lateral film was then obtained through the pelvis area. This documented accurate position of the vaginal cylinder for treatment.  HDR BRACHYTHERAPY TREATMENT  The remote afterloading device was affixed to the vaginal cylinder by catheter. Patient then proceeded to undergo her fifth high-dose-rate treatment directed at the proximal vagina. The patient was prescribed a dose of 6.0 gray to be delivered to the mucosal surface. Treatment length was 3.0 cm. Patient was treated with 1 channel using 7 dwell positions. Treatment time was 336.6 seconds. Iridium 192  was the high-dose-rate source for treatment. The patient tolerated the treatment well. After completion of her therapy, a radiation survey was performed documenting return of the iridium source into the [employer] safe.   PLAN: She has completed HDR treatment. Return in 1 month for follow-up.  ________________________________  Blair Promise, PhD, MD  This document serves as a record of services personally performed by Gery Pray, MD. It was created on his behalf by Mary-Margaret Loma Messing, a trained medical scribe. The creation of this record is based on the scribe's personal observations and the provider's statements to them. This document has been checked and approved by the attending provider.

## 2018-05-09 ENCOUNTER — Encounter: Payer: Self-pay | Admitting: Radiation Oncology

## 2018-05-09 NOTE — Progress Notes (Signed)
  Radiation Oncology         (336) [phone] ________________________________  Name: Debra Miranda MRN: [id number]  Date: 05/09/2018  DOB: 1945-09-06  End of Treatment Note  Diagnosis:   FIGO grade 2, Stage IA,Endometrial adenocarcinoma ([place])      Indication for treatment:  Curative       Radiation treatment dates:   04/05/18, 04/12/18, 04/19/18, 04/26/18, 05/04/18  Site/dose:  Vaginal cuff, 6 Gy in 5 fractions for a total dose of 30 Gy  Beams/energy:   HDR Ir-Vaginal, Iridium HDR, patient was treated with a 3 cm diameter segmented cylinder, Rx length of 3 cm, prescription was 6 gray to the mucosal surface  Narrative: The patient tolerated radiation treatment relatively well.     Pt denied vaginal bleeding throughout treatments. Overall the pt was without complaints.   Plan: The patient has completed radiation treatment. The patient will return to radiation oncology clinic for routine followup in one month. I advised them to call or return sooner if they have any questions or concerns related to their recovery or treatment.  -----------------------------------  Blair Promise, PhD, MD  This document serves as a record of services personally performed by Gery Pray, MD. It was created on his behalf by Mary-Margaret Loma Messing, a trained medical scribe. The creation of this record is based on the scribe's personal observations and the provider's statements to them. This document has been checked and approved by the attending provider.

## 2018-05-16 ENCOUNTER — Other Ambulatory Visit: Payer: Self-pay | Admitting: Family Medicine

## 2018-06-06 ENCOUNTER — Other Ambulatory Visit: Payer: Self-pay

## 2018-06-06 ENCOUNTER — Encounter: Payer: Self-pay | Admitting: Radiation Oncology

## 2018-06-06 ENCOUNTER — Ambulatory Visit
Admission: RE | Admit: 2018-06-06 | Discharge: 2018-06-06 | Disposition: A | Discharge status: Home / Self Care | Payer: Medicare PPO | Source: Ambulatory Visit | Attending: Radiation Oncology | Admitting: Radiation Oncology

## 2018-06-06 VITALS — BP 130/80 | HR 81 | Temp 98.5°F | Resp 18

## 2018-06-06 DIAGNOSIS — C541 Malignant neoplasm of endometrium: Secondary | ICD-10-CM | POA: Diagnosis not present

## 2018-06-06 DIAGNOSIS — Z08 Encounter for follow-up examination after completed treatment for malignant neoplasm: Secondary | ICD-10-CM | POA: Diagnosis not present

## 2018-06-06 HISTORY — DX: Malignant neoplasm of endometrium: C54.1

## 2018-06-06 HISTORY — DX: Basal cell carcinoma of skin, unspecified: C44.91

## 2018-06-06 NOTE — Progress Notes (Signed)
Pt presents today for follow up with Dr.Kinard.Pt denies complaints of pain.  Pt states mild occasional burning. Pt denies hematuria, Pt denies vaginal bleeding/discharge. Pt reports some rectal bleeding and attributes this to hemorrhoids. Pt denies abdominal bloating, n/v. Dialtor teaching given with good understanding  BP 130/80 (BP Location: Right Arm, Patient Position: Sitting)   Pulse 81   Temp 98.5 F (36.9 C) (Oral)   Resp 18   SpO2 96%

## 2018-06-06 NOTE — Patient Instructions (Signed)
Coronavirus (COVID-19) Are you at risk?  Are you at risk for the Coronavirus (COVID-19)?  To be considered HIGH RISK for Coronavirus (COVID-19), you have to meet the following criteria:  . Traveled to [place], [place], [place], [place] or [place]; or in the [place] to [place], [place], [place], or [place]; and have fever, cough, and shortness of breath within the last 2 weeks of travel OR . Been in close contact with a person diagnosed with COVID-19 within the last 2 weeks and have fever, cough, and shortness of breath . IF YOU DO NOT MEET THESE CRITERIA, YOU ARE CONSIDERED LOW RISK FOR COVID-19.  What to do if you are HIGH RISK for COVID-19?  . If you are having a medical emergency, call 911. . Seek medical care right away. Before you go to a doctor's office, urgent care or emergency department, call ahead and tell them about your recent travel, contact with someone diagnosed with COVID-19, and your symptoms. You should receive instructions from your physician's office regarding next steps of care.  . When you arrive at healthcare provider, tell the healthcare staff immediately you have returned from visiting [place], [place], [place], [place] or [place]; or traveled in the [place] to [place], [place], [place], or [place]; in the last two weeks or you have been in close contact with a person diagnosed with COVID-19 in the last 2 weeks.   . Tell the health care staff about your symptoms: fever, cough and shortness of breath. . After you have been seen by a medical provider, you will be either: o Tested for (COVID-19) and discharged home on quarantine except to seek medical care if symptoms worsen, and asked to  - Stay home and avoid contact with others until you get your results (4-5 days)  - Avoid travel on public transportation if possible (such as bus, train, or airplane) or o Sent to the Emergency Department by EMS for evaluation, COVID-19 testing, and possible  admission depending on your condition and test results.  What to do if you are LOW RISK for COVID-19?  Reduce your risk of any infection by using the same precautions used for avoiding the common cold or flu:  . Wash your hands often with soap and warm water for at least 20 seconds.  If soap and water are not readily available, use an alcohol-based hand sanitizer with at least 60% alcohol.  . If coughing or sneezing, cover your mouth and nose by coughing or sneezing into the elbow areas of your shirt or coat, into a tissue or into your sleeve (not your hands). . Avoid shaking hands with others and consider head nods or verbal greetings only. . Avoid touching your eyes, nose, or mouth with unwashed hands.  . Avoid close contact with people who are sick. . Avoid places or events with large numbers of people in one location, like concerts or sporting events. . Carefully consider travel plans you have or are making. . If you are planning any travel outside or inside the [place], visit the CDC's Travelers' Health webpage for the latest health notices. . If you have some symptoms but not all symptoms, continue to monitor at home and seek medical attention if your symptoms worsen. . If you are having a medical emergency, call 911.   ADDITIONAL HEALTHCARE OPTIONS FOR PATIENTS  [facility] / e-Visit: [email or web address]         [facility]: [phone]  [facility]: [phone]                   [facility]: [phone]   

## 2018-06-06 NOTE — Progress Notes (Signed)
  Radiation Oncology         (336) [phone] ________________________________  Name: Debra Miranda MRN: [id number]  Date: 06/06/2018  DOB: 27-Apr-1945  Follow-Up Visit Note  CC: [name], DO  [name], DO    ICD-10-CM   1. Endometrial adenocarcinoma ([facility]) C54.1     Diagnosis:   FIGO grade 2, Stage IA, Endometrial adenocarcinoma ([place]).  Interval Since Last Radiation:  1 month and 5 days 2/18, 2/25, 3/3/, 3/10, 05/04/2018: Vagina, Iridium HDR, 3 cm cylinder, tx length of 3 cm / 30 Gy in 5 fractions  Narrative:  The patient returns today for routine follow-up. She reports doing well overall since radiation completion. She was given dilator education by our LPN Carla.  She reports mild occasional dysuria and some rectal bleeding, which she attributes to hemorrhoids. She denies any pain, hematuria, vaginal bleeding or discharge, abdominal bloating, and nausea or vomiting.                               Allergies:  is allergic to latex.  Meds: Current Outpatient Medications  Medication Sig Dispense Refill  . atorvastatin (LIPITOR) 20 MG tablet Take 1 tablet (20 mg total) by mouth daily. (Patient taking differently: Take 20 mg by mouth at bedtime. ) 90 tablet 3  . Calcium Carbonate-Vit D-Min (CALCIUM 1200 PO) Take 1 tablet by mouth at bedtime.     . Cholecalciferol (VITAMIN D) 2000 UNITS CAPS Take 2,000 Units by mouth at bedtime.     Marland Kitchen escitalopram (LEXAPRO) 10 MG tablet Take 1 tablet (10 mg total) by mouth daily. (Patient taking differently: Take 10 mg by mouth at bedtime. ) 90 tablet 3  . metFORMIN (GLUCOPHAGE-XR) 500 MG 24 hr tablet TAKE 1 TABLET BY MOUTH EVERY DAY WITH BREAKFAST 90 tablet 0  . sitaGLIPtin (JANUVIA) 100 MG tablet Take 100 mg by mouth at bedtime.      No current facility-administered medications for this encounter.     Physical Findings: The patient is in no acute distress. Patient is alert and oriented.  oral temperature is 98.5 F (36.9 C). Her  blood pressure is 130/80 and her pulse is 81. Her respiration is 18 and oxygen saturation is 96%. .  No significant changes. Lungs are clear to auscultation bilaterally. Heart has regular rate and rhythm. No palpable cervical, supraclavicular, or axillary adenopathy. Abdomen soft, non-tender, normal bowel sounds.  Pelvic exam deferred in light of recent treatment completion.   Lab Findings: Lab Results  Component Value Date   WBC 6.4 03/08/2018   HGB 15.0 03/08/2018   HCT 43.7 03/08/2018   MCV 86 03/08/2018   PLT 133 (L) 03/08/2018    Radiographic Findings: No results found.  Impression:  FIGO grade 2, Stage IA, Endometrial adenocarcinoma ([place])  She overall tolerated her brachytherapy treatment extremely well without any side effects. Today the patient was given a vaginal dilator and instructions on its use.   Plan:  Patient is scheduled to follow up with Dr. Fermin Schwab on 08/09/2018 and in radiation oncology on 11/07/2018.  ____________________________________ Gery Pray, MD    This document serves as a record of services personally performed by Gery Pray, MD. It was created on his behalf by Wilburn Mylar, a trained medical scribe. The creation of this record is based on the scribe's personal observations and the provider's statements to them. This document has been checked and approved by the attending provider.

## 2018-06-07 ENCOUNTER — Ambulatory Visit (INDEPENDENT_AMBULATORY_CARE_PROVIDER_SITE_OTHER): Payer: Medicare PPO | Admitting: Family Medicine

## 2018-06-07 DIAGNOSIS — C541 Malignant neoplasm of endometrium: Secondary | ICD-10-CM | POA: Diagnosis not present

## 2018-06-07 DIAGNOSIS — E785 Hyperlipidemia, unspecified: Secondary | ICD-10-CM | POA: Diagnosis not present

## 2018-06-07 DIAGNOSIS — E1169 Type 2 diabetes mellitus with other specified complication: Secondary | ICD-10-CM | POA: Diagnosis not present

## 2018-06-07 DIAGNOSIS — E119 Type 2 diabetes mellitus without complications: Secondary | ICD-10-CM | POA: Diagnosis not present

## 2018-06-07 NOTE — Progress Notes (Signed)
Telephone visit  Subjective: CC: DM2, HTN, HLD, endometrial carcinoma PCP: [name], DO QQI:WLNLG C Vanburen is a 73 y.o. female calls for telephone consult today. Patient provides verbal consent for consult held via phone.  Location of patient: home Location of provider: Working remotely from home Others present for call: none  1. Type 2 Diabetes w/ HLD At last visit we increased patient's metformin to 1000 mg daily as her A1c was not considered at goal.  She was to continue Januvia 100 mg daily.  She notes that she was unable to tolerate the increased dose of metformin secondary to diarrhea.  She tried to take this for at least a couple of weeks before resuming the 500 mg daily.  She continues to take her Lipitor daily. She has not monitored her blood sugars but will start monitoring them and call us with these results Last eye exam: needs Last foot exam: UTD Last A1c:  Lab Results  Component Value Date   HGBA1C 7.8 (H) 03/08/2018   Nephropathy screen indicated?: UTD Last flu, zoster and/or pneumovax:  Immunization History  Administered Date(s) Administered  . Influenza, High Dose Seasonal PF 11/29/2015, 11/18/2016, 12/06/2017  . Influenza,inj,Quad PF,6+ Mos 12/07/2013, 11/12/2014  . Pneumococcal Conjugate-13 03/17/2013  . Pneumococcal Polysaccharide-23 07/29/2017, 02/09/2018    ROS: denies chest pain, shortness of breath, dizziness or loss of consciousness.  Appetite is good.  Patient reports feeling good overall.  2.  Endometrial adenocarcinoma Patient had a visit with her oncologist yesterday.  She reports that she has been doing excellent from a cancer standpoint.  She feels like her energy improves every day.  In fact, she walked her dog half a mile today and did well.  No fevers.  Blood pressure has been controlled.  Urine output and bowel movements are normalizing.  She finished her last radiation treatment 1 month ago.   ROS: Per HPI  Allergies  Allergen  Reactions  . Latex Itching   Past Medical History:  Diagnosis Date  . Arthritis    right hand  . Cataract    RIGHT EYE; now removed  . Depression    many years ago, no meds  . Diabetes mellitus without complication ([place])    type 2  . Endometrial adenocarcinoma ([place]) 01/05/2018   treated with HDR  . Environmental allergies   . Family history of breast cancer   . Family history of kidney cancer   . Family history of pancreatic cancer   . Family history of stomach cancer   . Family history of uterine cancer   . Hemorrhoids   . Nodular basal cell carcinoma ([facility]) 08/2017   LEFT LOWER LEG above foot  . Orthostatic hypotension    Patient is unaware of this diagnosis    Current Outpatient Medications:  .  atorvastatin (LIPITOR) 20 MG tablet, Take 1 tablet (20 mg total) by mouth daily. (Patient taking differently: Take 20 mg by mouth at bedtime. ), Disp: 90 tablet, Rfl: 3 .  Calcium Carbonate-Vit D-Min (CALCIUM 1200 PO), Take 1 tablet by mouth at bedtime. , Disp: , Rfl:  .  Cholecalciferol (VITAMIN D) 2000 UNITS CAPS, Take 2,000 Units by mouth at bedtime. , Disp: , Rfl:  .  escitalopram (LEXAPRO) 10 MG tablet, Take 1 tablet (10 mg total) by mouth daily. (Patient taking differently: Take 10 mg by mouth at bedtime. ), Disp: 90 tablet, Rfl: 3 .  metFORMIN (GLUCOPHAGE-XR) 500 MG 24 hr tablet, TAKE 1 TABLET BY MOUTH EVERY DAY  WITH BREAKFAST, Disp: 90 tablet, Rfl: 0 .  sitaGLIPtin (JANUVIA) 100 MG tablet, Take 100 mg by mouth at bedtime. , Disp: , Rfl:   Assessment/ Plan: 73 y.o. female   1. Controlled type 2 diabetes mellitus without complication, without long-term current use of insulin (HCC) Unable to tolerate increased dose of metformin.  Continue metformin 500 mg along with the Januvia 100 mg daily.  I have recommended that she monitor her fasting blood sugar each morning and contact us with the results.  We will plan to check her A1c in the next couple of months if able.  2.  Hyperlipidemia associated with type 2 diabetes mellitus ([place]) Continue statin.  3. Endometrial adenocarcinoma ([place]) Stable and doing well status post completion of radiation treatment after hysterectomy.   Start time: 1:14pm End time: 1:21pm  Total time spent on patient care (including telephone call/ virtual visit): 15 minutes  [place], [place] [phone]

## 2018-07-04 ENCOUNTER — Telehealth: Payer: Self-pay | Admitting: *Deleted

## 2018-07-04 NOTE — Telephone Encounter (Signed)
Called and left the patient a message to call the office back. Need to change her 6/23 appt

## 2018-07-04 NOTE — Telephone Encounter (Signed)
Patient called back dand moved her appt to 6/2

## 2018-07-14 ENCOUNTER — Telehealth: Payer: Self-pay | Admitting: *Deleted

## 2018-07-14 NOTE — Telephone Encounter (Signed)
Patient call to confirm her appt in June.

## 2018-07-18 NOTE — Progress Notes (Signed)
Consult Note: Gyn-Onc  Debra Miranda 73 y.o. female  CC:  Chief Complaint  Patient presents with  . Endometrial adenocarcinoma ([place])    HPI: Consult was originally requested by Dr.Luther Elonda Husky for Grade 2 Endometrial Cancer on D&C/Polypectomy.  The patient noted right sided pain about 2 months prior to her presentation. She does have a h/o chrnoic back pain for which she had to see pain management for a time. She has not required narcotic pain medications for some time now. Based on her complaints a CTScan was ordered by her PCP. This revealed "constipation", a fat-containing large mouthed hernia, and "central uterine hypoattenuation at the fundus of 68mm"  Given the CT findings a GYN ultrasound was ordered. This revealed a 6.7x3.3x3.8cm uterus with a hyperechoic area central uterus 1.5x0.8cm suspicious for a polyp. Given this information she was referred to Dr. Elonda Husky and workup ensued including [facility] D&C with findings showing a single polyp and grossly normal endometrium. The polyp returned with Grade 2 Endometrioid Adenocarcinoma "partially involving a polyp".  Dr. Gerarda Fraction took her for surgery 02/08/18 and performed Robotic assisted TLH/BSO with sentinel nodes and washings. Frozen section returned with no myo invasion and so further nodal dissection was stopped.  Her final pathology revealed: 1. Uterus, ovaries and fallopian tube - UTERUS -ENDOMETRIUM: ENDOMETRIOID ADENOCARCINOMA, FIGO GRADE 2, RESIDUAL TUMOR SPANNING APPROXIMATELY 0.3 CM. NO MYOMETRIAL INVASION IDENTIFIED. - CERVIX: BENIGN SQUAMOUS AND ENDOCERVICAL MUCOSA. NO DYSPLASIA OR MALIGNANCY. - BILATERAL OVARIES: INCLUSION CYSTS. NO MALIGNANCY. - BILATERAL FALLOPIAN TUBES: UNREMARKABLE. NO MALIGNANCY. 2. Lymph node, sentinel, biopsy, right obturator - ONE OF ONE LYMPH NODES NEGATIVE FOR CARCINOMA (0/1). 3. Lymph node, sentinel, biopsy, left external iliac - ONE OF ONE LYMPH NODES NEGATIVE FOR CARCINOMA (0/1). 4. Lymph node,  biopsy, right pelvic - FIVE OF FIVE LYMPH NODES NEGATIVE FOR CARCINOMA (0/5). Lymphovascular invasion: Not identified.  Interval History:  Patient was staged as a 1A grade 2 endometrioid adenocarcinoma and underwent vaginal cuff brachytherapy under the care of Dr. Sondra Come. Radiation:   2/18, 2/25, 3/3/, 3/10, 05/04/2018: Vagina, Iridium HDR, 3 cm cylinder, tx length of 3 cm / 30 Gy in 5 fractions.  She last saw Dr. Randa Ngo in April.  She comes in to reestablish with GYN oncology.  She comes in today for follow-up.  Is overall doing very well.  She does have a little bit of spotting about every 5 to 6 weeks but is always with her dilator use.  She was asking how much longer she will need to use her vaginal dilators.  It is not painful.  She does complain of some burning when she goes to the bathroom.  She is not sure if she will be able to give [place] a specimen today as she just voided.  She has no issues with her bowels.  She does have a little bit of a cough which she attributes to allergies.  However, she smokes 4 cigars a day.  She does have some pain in her lower back related to her surgery as well as some pain in her right hand from arthritis.  There are no new medical problems in her family.  Review of Systems: Constitutional: Denies fever. Skin: No rash Cardiovascular: No chest pain, shortness of breath, or edema  Pulmonary: + non-productive cough Gastro Intestinal:  No nausea, vomiting, constipation, or diarrhea reported. Genitourinary: + dysuria.  + vaginal bleeding as above Musculoskeletal: + lower back pain Neurologic: No weakness Psychology: No complaints  Current Meds:  Outpatient Encounter  Medications as of 07/19/2018  Medication Sig  . atorvastatin (LIPITOR) 20 MG tablet Take 1 tablet (20 mg total) by mouth daily. (Patient taking differently: Take 20 mg by mouth at bedtime. )  . Calcium Carbonate-Vit D-Min (CALCIUM 1200 PO) Take 1 tablet by mouth at bedtime.   . Cholecalciferol  (VITAMIN D) 2000 UNITS CAPS Take 2,000 Units by mouth at bedtime.   Marland Kitchen escitalopram (LEXAPRO) 10 MG tablet Take 1 tablet (10 mg total) by mouth daily. (Patient taking differently: Take 10 mg by mouth at bedtime. )  . metFORMIN (GLUCOPHAGE-XR) 500 MG 24 hr tablet TAKE 1 TABLET BY MOUTH EVERY DAY WITH BREAKFAST  . sitaGLIPtin (JANUVIA) 100 MG tablet Take 100 mg by mouth at bedtime.    No facility-administered encounter medications on file as of 07/19/2018.     Allergy:  Allergies  Allergen Reactions  . Latex Itching    Social Hx:   Social History   Socioeconomic History  . Marital status: Widowed    Spouse name: Not on file  . Number of children: 1  . Years of education: 68  . Highest education level: Associate degree: occupational, Hotel manager, or vocational program  Occupational History  . Occupation: Retired    Comment: Data processing manager Needs  . Financial resource strain: Not hard at all  . Food insecurity:    Worry: Never true    Inability: Never true  . Transportation needs:    Medical: No    Non-medical: No  Tobacco Use  . Smoking status: Current Every Day Smoker    Years: 45.00    Types: Cigars    Last attempt to quit: 07/29/2010    Years since quitting: 7.9  . Smokeless tobacco: Never Used  . Tobacco comment: 4 small cigars a day  Substance and Sexual Activity  . Alcohol use: Yes    Comment: 1 every 6 months  . Drug use: No  . Sexual activity: Not Currently  Lifestyle  . Physical activity:    Days per week: 7 days    Minutes per session: 120 min  . Stress: Only a little  Relationships  . Social connections:    Talks on phone: More than three times a week    Gets together: More than three times a week    Attends religious service: More than 4 times per year    Active member of club or organization: Yes    Attends meetings of clubs or organizations: Never    Relationship status: Married  . Intimate partner violence:    Fear of current or ex partner:  No    Emotionally abused: No    Physically abused: No    Forced sexual activity: Not on file  Other Topics Concern  . Not on file  Social History Narrative  . Not on file    Past Surgical Hx:  Past Surgical History:  Procedure Laterality Date  . ABDOMINAL HYSTERECTOMY     Dr. Gerarda Fraction 02-08-18   . CATARACT EXTRACTION     RIGHT EYE  . HYSTEROSCOPY W/D&C N/A 01/05/2018   Procedure: DILATATION AND CURETTAGE/HYSTEROSCOPY;  Surgeon: Florian Buff, MD;  Location: [facility];  Service: Gynecology;  Laterality: N/A;  . LUMBAR [place] SURGERY  2006  . POLYPECTOMY N/A 01/05/2018   Procedure: ENDOMETRIAL POLYPECTOMY;  Surgeon: Florian Buff, MD;  Location: [facility];  Service: Gynecology;  Laterality: N/A;  . ROBOTIC ASSISTED TOTAL HYSTERECTOMY WITH BILATERAL SALPINGO OOPHERECTOMY Bilateral 02/08/2018   Procedure: XI  ROBOTIC ASSISTED TOTAL HYSTERECTOMY WITH BILATERAL SALPINGO OOPHORECTOMY;  Surgeon: Isabel Caprice, MD;  Location: [facility];  Service: Gynecology;  Laterality: Bilateral;  . SENTINEL NODE BIOPSY Bilateral 02/08/2018   Procedure: SENTINEL NODE BIOPSY AND PELVIC LYMPH NODE SAMPLING;  Surgeon: Isabel Caprice, MD;  Location: [facility];  Service: Gynecology;  Laterality: Bilateral;  . TONSILLECTOMY     as a child    Past Medical Hx:  Past Medical History:  Diagnosis Date  . Arthritis    right hand  . Cataract    RIGHT EYE; now removed  . Depression    many years ago, no meds  . Diabetes mellitus without complication ([place])    type 2  . Endometrial adenocarcinoma ([place]) 01/05/2018   treated with HDR  . Environmental allergies   . Family history of breast cancer   . Family history of kidney cancer   . Family history of pancreatic cancer   . Family history of stomach cancer   . Family history of uterine cancer   . Hemorrhoids   . Nodular basal cell carcinoma ([facility]) 08/2017   LEFT LOWER LEG above foot  . Orthostatic hypotension    Patient is unaware of this diagnosis    Oncology  Hx:   No history exists.    Family Hx:  Family History  Problem Relation Age of Onset  . Cervical cancer Mother 36       treated with radiation  . Pancreatic cancer Mother 75  . Stroke Father   . Pulmonary fibrosis Brother   . Alzheimer's disease Brother   . Stomach cancer Brother        dx 54s  . Diabetes Brother   . Lung cancer Maternal Grandmother   . Stroke Paternal Grandfather   . Uterine cancer Maternal Aunt        dx 66s  . Kidney cancer Maternal Uncle        dx 39s  . Colon cancer Maternal Uncle        dx 51s  . Breast cancer Cousin        maternal cousin dx 20    Vitals:  Blood pressure 130/64, pulse 78, temperature 98.2 F (36.8 C), temperature source Oral, resp. rate 18, height 5\' 7"  (1.702 m), weight 190 lb 12.8 oz (86.5 kg), SpO2 98 %.  Physical Exam: Well-nourished well-developed female in no acute distress.  Neck: Supple, no lymphadenopathy, no thyromegaly.  Abdomen: Well-healed robotic skin incisions.  In her umbilicus she is got a large 4 cm reducible hernia with her robotic incision just superior to that.  It is reducible and per the patient not changed.  Abdomen is nontender.  She does have a fair amount of Candida under her pannus.  Groins: No lymphadenopathy.  Extremities: No edema.  She does have a 2 cm very mobile lipoma under her right elbow  Pelvic: External genitalia notable for erythema consistent with Candida.  Speculum examination reveals an atrophic vagina with changes consistent with Candida.  The vaginal cuff is visualized.  There is no evidence of blood.  There is no other discharge.  There are no visible lesions.  Bimanual semination reveals the vaginal cuff to be freely mobile with no nodularity or masses appreciated.  Rectal confirms.  Assessment/Plan:  73 year old with a stage Ia grade 2 endometrial adenocarcinoma status post definitive surgery in December 2019.  She completed her vaginal cuff brachytherapy in 5 fractions under the  care of Dr.Kinard in March 2020.  She will return to see [place] in December 2020.  She has an appointment to see Dr. Sondra Come on September 21 at 3:00.  She has fairly pronounced Candida today.  Her sugars have been in the 190s.  We discussed the importance of glycemic control.  She was provided a prescription for Diflucan to treat the vaginal candidiasis and nystatin powder for the external candidiasis under her pannus.  Melvie Paglia A., MD 07/19/2018, 1:30 PM

## 2018-07-19 ENCOUNTER — Encounter: Payer: Self-pay | Admitting: Gynecologic Oncology

## 2018-07-19 ENCOUNTER — Inpatient Hospital Stay: Payer: Medicare PPO | Attending: Gynecology | Admitting: Gynecologic Oncology

## 2018-07-19 ENCOUNTER — Other Ambulatory Visit: Payer: Self-pay

## 2018-07-19 VITALS — BP 130/64 | HR 78 | Temp 98.2°F | Resp 18 | Ht 67.0 in | Wt 190.8 lb

## 2018-07-19 DIAGNOSIS — Z90710 Acquired absence of both cervix and uterus: Secondary | ICD-10-CM | POA: Diagnosis not present

## 2018-07-19 DIAGNOSIS — Z923 Personal history of irradiation: Secondary | ICD-10-CM

## 2018-07-19 DIAGNOSIS — B373 Candidiasis of vulva and vagina: Secondary | ICD-10-CM | POA: Diagnosis not present

## 2018-07-19 DIAGNOSIS — C541 Malignant neoplasm of endometrium: Secondary | ICD-10-CM | POA: Diagnosis not present

## 2018-07-19 DIAGNOSIS — Z90722 Acquired absence of ovaries, bilateral: Secondary | ICD-10-CM

## 2018-07-19 MED ORDER — NYSTATIN 100000 UNIT/GM EX POWD: Freq: Two times a day (BID) | CUTANEOUS | 2 refills | Status: DC

## 2018-07-19 MED ORDER — FLUCONAZOLE 100 MG PO TABS: 100.0000 mg | ORAL_TABLET | Freq: Once | ORAL | 0 refills | Status: AC

## 2018-07-19 NOTE — Patient Instructions (Addendum)
Follow-up with Dr. Sondra Come on  September 21 at 3:00 as scheduled and return to see [place] approximately  3 months afterthat that that.Call the office in October at [phone] to schedule an appointment in December 2020.  Nystatin topical powder What is this medicine? NYSTATIN (nye STAT in) is an antifungal medicine. It is used to treat certain kinds of fungal or yeast infections of the skin. This medicine may be used for other purposes; ask your health care provider or pharmacist if you have questions. COMMON BRAND NAME(S): Mycostatin, Nyamyc, Nyata, Nystop, Pedi-Dri What should I tell my health care provider before I take this medicine? They need to know if you have any of these conditions: -an unusual or allergic reaction to nystatin, other foods, dyes or preservatives -pregnant or trying to get pregnant -breast-feeding How should I use this medicine? This medicine is for external use on the skin only. Follow the directions on the prescription label. Dust the powder on the affected area (or into socks and shoes). If you are treating diaper rash, do not use tight-fitting diapers or plastic pants. Do not get the medicine in your eyes. If you do, rinse out with plenty of cool tap water. Do not breathe in the powder. Do not use your medicine more often than directed. Use your doses at regular intervals. Finish the full course prescribed by your doctor or health care professional even if you think your condition is better. Do not stop using except on the advice of your doctor or health care professional. Talk to your pediatrician regarding the use of this medicine in children. Special care may be needed. Overdosage: If you think you have taken too much of this medicine contact a poison control center or emergency room at once. NOTE: This medicine is only for you. Do not share this medicine with others. What if I miss a dose? If you miss a dose, use it as soon as you can. If it is almost time for your  next dose, use only that dose. Do not use double or extra doses. What may interact with this medicine? Interactions are not expected. Do not use any other skin products on the affected area without telling your doctor or health care professional. This list may not describe all possible interactions. Give your health care provider a list of all the medicines, herbs, non-prescription drugs, or dietary supplements you use. Also tell them if you smoke, drink alcohol, or use illegal drugs. Some items may interact with your medicine. What should I watch for while using this medicine? Tell your doctor or health care professional if your symptoms do not improve after 3 days. After bathing make sure that your skin is very dry. Fungal infections like moist conditions. Do not walk around barefoot. To help prevent reinfection, wear freshly washed cotton, not synthetic, clothing. What side effects may I notice from receiving this medicine? Side effects that you should report to your doctor or health care professional as soon as possible: -allergic reactions like skin rash, itching or hives, swelling of the face, lips, or tongue Side effects that usually do not require medical attention (report to your doctor or health care professional if they continue or are bothersome): -skin irritation This list may not describe all possible side effects. Call your doctor for medical advice about side effects. You may report side effects to FDA at 1-800-FDA-1088. Where should I keep my medicine? Keep out of the reach of children. Store at room temperature between 15 and 30  degrees C (59 and 86 degrees F). Throw away any unused medicine after the expiration date. NOTE: This sheet is a summary. It may not cover all possible information. If you have questions about this medicine, talk to your doctor, pharmacist, or health care provider.  2019 Elsevier/Gold Standard (2015-03-07 10:36:02) Fluconazole tablets What is this  medicine? FLUCONAZOLE (floo KON na zole) is an antifungal medicine. It is used to treat certain kinds of fungal or yeast infections. This medicine may be used for other purposes; ask your health care provider or pharmacist if you have questions. COMMON BRAND NAME(S): Diflucan What should I tell my health care provider before I take this medicine? They need to know if you have any of these conditions: -history of irregular heart beat -kidney disease -an unusual or allergic reaction to fluconazole, other azole antifungals, medicines, foods, dyes, or preservatives -pregnant or trying to get pregnant -breast-feeding How should I use this medicine? Take this medicine by mouth. Follow the directions on the prescription label. Do not take your medicine more often than directed. Talk to your pediatrician regarding the use of this medicine in children. Special care may be needed. This medicine has been used in children as young as 41 months of age. Overdosage: If you think you have taken too much of this medicine contact a poison control center or emergency room at once. NOTE: This medicine is only for you. Do not share this medicine with others. What if I miss a dose? If you miss a dose, take it as soon as you can. If it is almost time for your next dose, take only that dose. Do not take double or extra doses. What may interact with this medicine? Do not take this medicine with any of the following medications: -astemizole -certain medicines for irregular heart beat like dofetilide, dronedarone, quinidine -cisapride -erythromycin -lomitapide -other medicines that prolong the QT interval (cause an abnormal heart rhythm) -pimozide -terfenadine -thioridazine -tolvaptan -ziprasidone This medicine may also interact with the following medications: -antiviral medicines for HIV or AIDS -birth control pills -certain antibiotics like rifabutin, rifampin -certain medicines for blood pressure like  amlodipine, isradipine, felodipine, hydrochlorothiazide, losartan, nifedipine -certain medicines for cancer like cyclophosphamide, vinblastine, vincristine -certain medicines for cholesterol like atorvastatin, lovastatin, fluvastatin, simvastatin -certain medicines for depression, anxiety, or psychotic disturbances like amitriptyline, midazolam, nortriptyline, triazolam -certain medicines for diabetes like glipizide, glyburide, tolbutamide -certain medicines for pain like alfentanil, fentanyl, methadone -certain medicines for seizures like carbamazepine, phenytoin -certain medicines that treat or prevent blood clots like warfarin -halofantrine -medicines that lower your chance of fighting infection like cyclosporine, prednisone, tacrolimus -NSAIDS, medicines for pain and inflammation, like celecoxib, diclofenac, flurbiprofen, ibuprofen, meloxicam, naproxen -other medicines for fungal infections -sirolimus -theophylline -tofacitinib This list may not describe all possible interactions. Give your health care provider a list of all the medicines, herbs, non-prescription drugs, or dietary supplements you use. Also tell them if you smoke, drink alcohol, or use illegal drugs. Some items may interact with your medicine. What should I watch for while using this medicine? Visit your doctor or health care professional for regular checkups. If you are taking this medicine for a long time you may need blood work. Tell your doctor if your symptoms do not improve. Some fungal infections need many weeks or months of treatment to cure. Alcohol can increase possible damage to your liver. Avoid alcoholic drinks. If you have a vaginal infection, do not have sex until you have finished your treatment. You can wear a  sanitary napkin. Do not use tampons. Wear freshly washed cotton, not synthetic, panties. What side effects may I notice from receiving this medicine? Side effects that you should report to your doctor  or health care professional as soon as possible: -allergic reactions like skin rash or itching, hives, swelling of the lips, mouth, tongue, or throat -dark urine -feeling dizzy or faint -irregular heartbeat or chest pain -redness, blistering, peeling or loosening of the skin, including inside the mouth -trouble breathing -unusual bruising or bleeding -vomiting -yellowing of the eyes or skin Side effects that usually do not require medical attention (report to your doctor or health care professional if they continue or are bothersome): -changes in how food tastes -diarrhea -headache -stomach upset or nausea This list may not describe all possible side effects. Call your doctor for medical advice about side effects. You may report side effects to FDA at 1-800-FDA-1088. Where should I keep my medicine? Keep out of the reach of children. Store at room temperature below 30 degrees C (86 degrees F). Throw away any medicine after the expiration date. NOTE: This sheet is a summary. It may not cover all possible information. If you have questions about this medicine, talk to your doctor, pharmacist, or health care provider.  2019 Elsevier/Gold Standard (2012-09-10 19:37:38)

## 2018-08-09 ENCOUNTER — Ambulatory Visit: Payer: Medicare PPO | Admitting: Gynecology

## 2018-08-12 ENCOUNTER — Telehealth: Payer: Self-pay | Admitting: Family Medicine

## 2018-08-18 ENCOUNTER — Other Ambulatory Visit: Payer: Self-pay | Admitting: Family Medicine

## 2018-09-08 ENCOUNTER — Other Ambulatory Visit: Payer: Self-pay | Admitting: Family Medicine

## 2018-09-08 DIAGNOSIS — E785 Hyperlipidemia, unspecified: Secondary | ICD-10-CM

## 2018-10-31 DIAGNOSIS — L570 Actinic keratosis: Secondary | ICD-10-CM | POA: Diagnosis not present

## 2018-11-01 ENCOUNTER — Telehealth: Payer: Self-pay | Admitting: Family Medicine

## 2018-11-01 ENCOUNTER — Other Ambulatory Visit: Payer: Self-pay | Admitting: Family Medicine

## 2018-11-01 DIAGNOSIS — Z1211 Encounter for screening for malignant neoplasm of colon: Secondary | ICD-10-CM

## 2018-11-01 NOTE — Telephone Encounter (Signed)
This has been placed

## 2018-11-07 ENCOUNTER — Encounter: Payer: Self-pay | Admitting: Radiation Oncology

## 2018-11-07 ENCOUNTER — Ambulatory Visit
Admission: RE | Admit: 2018-11-07 | Discharge: 2018-11-07 | Disposition: A | Discharge status: Home / Self Care | Payer: Medicare PPO | Source: Ambulatory Visit | Attending: Radiation Oncology | Admitting: Radiation Oncology

## 2018-11-07 ENCOUNTER — Other Ambulatory Visit: Payer: Self-pay

## 2018-11-07 VITALS — BP 157/93 | HR 80 | Temp 98.2°F | Resp 18 | Ht 67.0 in | Wt 190.4 lb

## 2018-11-07 DIAGNOSIS — Z79899 Other long term (current) drug therapy: Secondary | ICD-10-CM | POA: Diagnosis not present

## 2018-11-07 DIAGNOSIS — Z8542 Personal history of malignant neoplasm of other parts of uterus: Secondary | ICD-10-CM | POA: Diagnosis not present

## 2018-11-07 DIAGNOSIS — R300 Dysuria: Secondary | ICD-10-CM | POA: Diagnosis not present

## 2018-11-07 DIAGNOSIS — B3749 Other urogenital candidiasis: Secondary | ICD-10-CM | POA: Diagnosis not present

## 2018-11-07 DIAGNOSIS — Z08 Encounter for follow-up examination after completed treatment for malignant neoplasm: Secondary | ICD-10-CM | POA: Diagnosis not present

## 2018-11-07 DIAGNOSIS — B373 Candidiasis of vulva and vagina: Secondary | ICD-10-CM | POA: Diagnosis not present

## 2018-11-07 DIAGNOSIS — Z7984 Long term (current) use of oral hypoglycemic drugs: Secondary | ICD-10-CM | POA: Diagnosis not present

## 2018-11-07 DIAGNOSIS — C541 Malignant neoplasm of endometrium: Secondary | ICD-10-CM

## 2018-11-07 MED ORDER — FLUCONAZOLE 150 MG PO TABS: 150.0000 mg | ORAL_TABLET | Freq: Every day | ORAL | 0 refills | Status: DC

## 2018-11-07 NOTE — Patient Instructions (Signed)
Coronavirus (COVID-19) Are you at risk?  Are you at risk for the Coronavirus (COVID-19)?  To be considered HIGH RISK for Coronavirus (COVID-19), you have to meet the following criteria:  . Traveled to [place], [place], [place], [place] or [place]; or in the [place] to [place], [place], [place], or [place]; and have fever, cough, and shortness of breath within the last 2 weeks of travel OR . Been in close contact with a person diagnosed with COVID-19 within the last 2 weeks and have fever, cough, and shortness of breath . IF YOU DO NOT MEET THESE CRITERIA, YOU ARE CONSIDERED LOW RISK FOR COVID-19.  What to do if you are HIGH RISK for COVID-19?  . If you are having a medical emergency, call 911. . Seek medical care right away. Before you go to a doctor's office, urgent care or emergency department, call ahead and tell them about your recent travel, contact with someone diagnosed with COVID-19, and your symptoms. You should receive instructions from your physician's office regarding next steps of care.  . When you arrive at healthcare provider, tell the healthcare staff immediately you have returned from visiting [place], [place], [place], [place] or [place]; or traveled in the [place] to [place], [place], [place], or [place]; in the last two weeks or you have been in close contact with a person diagnosed with COVID-19 in the last 2 weeks.   . Tell the health care staff about your symptoms: fever, cough and shortness of breath. . After you have been seen by a medical provider, you will be either: o Tested for (COVID-19) and discharged home on quarantine except to seek medical care if symptoms worsen, and asked to  - Stay home and avoid contact with others until you get your results (4-5 days)  - Avoid travel on public transportation if possible (such as bus, train, or airplane) or o Sent to the Emergency Department by EMS for evaluation, COVID-19 testing, and possible  admission depending on your condition and test results.  What to do if you are LOW RISK for COVID-19?  Reduce your risk of any infection by using the same precautions used for avoiding the common cold or flu:  . Wash your hands often with soap and warm water for at least 20 seconds.  If soap and water are not readily available, use an alcohol-based hand sanitizer with at least 60% alcohol.  . If coughing or sneezing, cover your mouth and nose by coughing or sneezing into the elbow areas of your shirt or coat, into a tissue or into your sleeve (not your hands). . Avoid shaking hands with others and consider head nods or verbal greetings only. . Avoid touching your eyes, nose, or mouth with unwashed hands.  . Avoid close contact with people who are sick. . Avoid places or events with large numbers of people in one location, like concerts or sporting events. . Carefully consider travel plans you have or are making. . If you are planning any travel outside or inside the [place], visit the CDC's Travelers' Health webpage for the latest health notices. . If you have some symptoms but not all symptoms, continue to monitor at home and seek medical attention if your symptoms worsen. . If you are having a medical emergency, call 911.   ADDITIONAL HEALTHCARE OPTIONS FOR PATIENTS  [facility] / e-Visit: [email or web address]         [facility]: [phone]  [facility]: [phone]                   [facility]: [phone]   

## 2018-11-07 NOTE — Progress Notes (Signed)
Radiation Oncology         [phone] ________________________________  Name: Debra Miranda MRN: [id number]  Date: 11/07/2018  DOB: 11-21-1945  Follow-Up Visit Note  CC: [name], DO  [name], DO    ICD-10-CM   1. Endometrial adenocarcinoma (HCC)  C54.1     Diagnosis:   FIGO grade 2, Stage IA, Endometrial adenocarcinoma ([place]).  Interval Since Last Radiation:  6 months  2/18, 2/25, 3/3/, 3/10, 05/04/2018: Vagina, Iridium HDR, 3 cm cylinder, tx length of 3 cm / 30 Gy in 5 fractions  Narrative:  The patient returns today for routine follow-up. She last saw Dr. Alycia Rossetti on 07/19/2018 and was found to have fairly pronounced candida. Exam at that time was otherwise stable.  On review of systems, she reports dysuria with light-colored urine and "terrible" odor, denies any cloudiness of her urine occasional scant hemorrhoidal bleeding, occasional constipation, GERD yesterday with vomiting, arthritis pain in her right hand, and allergies. She states her arthritis pain is a 4/10 with compression glove on. She denies hematuria and vaginal discharge or bleeding.  Allergies:  is allergic to latex.  Meds: Current Outpatient Medications  Medication Sig Dispense Refill  . atorvastatin (LIPITOR) 20 MG tablet TAKE 1 TABLET BY MOUTH EVERY DAY 90 tablet 0  . Calcium Carbonate-Vit D-Min (CALCIUM 1200 PO) Take 1 tablet by mouth at bedtime.     . Cholecalciferol (VITAMIN D) 2000 UNITS CAPS Take 2,000 Units by mouth at bedtime.     Marland Kitchen escitalopram (LEXAPRO) 10 MG tablet Take 1 tablet (10 mg total) by mouth daily. (Patient taking differently: Take 10 mg by mouth at bedtime. ) 90 tablet 3  . metFORMIN (GLUCOPHAGE-XR) 500 MG 24 hr tablet TAKE 1 TABLET BY MOUTH EVERY DAY WITH BREAKFAST 90 tablet 0  . nystatin (MYCOSTATIN/NYSTOP) powder Apply topically 2 (two) times daily. 60 g 2  . sitaGLIPtin (JANUVIA) 100 MG tablet Take 100 mg by mouth at bedtime.     . fluconazole (DIFLUCAN) 150 MG  tablet Take 1 tablet (150 mg total) by mouth daily. Take second tablet 72 hours later 2 tablet 0   No current facility-administered medications for this encounter.     Physical Findings: The patient is in no acute distress. Patient is alert and oriented.  height is 5\' 7"  (1.702 m) and weight is 190 lb 6.4 oz (86.4 kg). Her temporal temperature is 98.2 F (36.8 C). Her blood pressure is 157/93 (abnormal) and her pulse is 80. Her respiration is 18 and oxygen saturation is 94%. .  No significant changes. Lungs are clear to auscultation bilaterally. Heart has regular rate and rhythm. No palpable cervical, supraclavicular, or axillary adenopathy. Abdomen soft, non-tender, normal bowel sounds.  On pelvic examination the external genitalia remarkable diffuse erythema consistent with yeast infection. A speculum exam was performed.  Diffuse erythema noted in the vaginal vault. There are no mucosal lesions noted in the vaginal vault. On bimanual and rectovaginal examination there were no pelvic masses appreciated.  Vaginal cuff intact.   Lab Findings: Lab Results  Component Value Date   WBC 6.4 03/08/2018   HGB 15.0 03/08/2018   HCT 43.7 03/08/2018   MCV 86 03/08/2018   PLT 133 (L) 03/08/2018    Radiographic Findings: No results found.  Impression:  FIGO grade 2, Stage IA, Endometrial adenocarcinoma   No evidence of recurrence on clinical exam today.  Patient has developed recurrence of her yeast infection.  She will be given nystatin powder  for the external region and Diflucan which worked well with her follow-up with Dr. Alycia Rossetti.  Plan:  Patient is scheduled to follow up with Dr. Gaylord Shih on 01/31/2019 and in radiation oncology in march 2021.  ____________________________________ Gery Pray, MD    This document serves as a record of services personally performed by Gery Pray, MD. It was created on his behalf by Wilburn Mylar, a trained medical scribe. The creation of this  record is based on the scribe's personal observations and the provider's statements to them. This document has been checked and approved by the attending provider.

## 2018-11-07 NOTE — Progress Notes (Signed)
Pt presents today for f/u with Dr. Sondra Come. Pt reports arthitis pain in right hand, rated 4/10 with glove on. Pt reports dysuria, light colored urine with "terrible" odor. Pt denies hematuria. Pt states she experiences these symptoms and attributes it to "summer time heat". Pt denies vaginal bleeding/discharge. Pt reports occasional scant hemorrhoidal bleeding. Pt reports occasional constipation. Pt reports GERD yesterday with vomiting and states her allergies are "awful".   BP (!) 157/93 (BP Location: Left Arm, Patient Position: Sitting)   Pulse 80   Temp 98.2 F (36.8 C) (Temporal)   Resp 18   Ht 5\' 7"  (1.702 m)   Wt 190 lb 6.4 oz (86.4 kg)   SpO2 94%   BMI 29.82 kg/m   Wt Readings from Last 3 Encounters:  11/07/18 190 lb 6.4 oz (86.4 kg)  07/19/18 190 lb 12.8 oz (86.5 kg)  04/12/18 188 lb (85.3 kg)   Loma Sousa, RN BSN

## 2018-11-08 ENCOUNTER — Telehealth: Payer: Self-pay | Admitting: *Deleted

## 2018-11-08 NOTE — Telephone Encounter (Signed)
Called patient to inform of fu appt. with Dr. Sondra Come on 05-08-19 @ 3 pm, lvm for a return call

## 2018-11-14 ENCOUNTER — Telehealth: Payer: Self-pay | Admitting: Family Medicine

## 2018-11-14 ENCOUNTER — Other Ambulatory Visit: Payer: Self-pay

## 2018-11-15 ENCOUNTER — Ambulatory Visit (INDEPENDENT_AMBULATORY_CARE_PROVIDER_SITE_OTHER): Payer: Medicare PPO

## 2018-11-15 DIAGNOSIS — Z23 Encounter for immunization: Secondary | ICD-10-CM | POA: Diagnosis not present

## 2018-11-17 ENCOUNTER — Ambulatory Visit (INDEPENDENT_AMBULATORY_CARE_PROVIDER_SITE_OTHER): Payer: Medicare PPO | Admitting: *Deleted

## 2018-11-17 DIAGNOSIS — Z0000 Encounter for general adult medical examination without abnormal findings: Secondary | ICD-10-CM | POA: Diagnosis not present

## 2018-11-17 NOTE — Progress Notes (Signed)
MEDICARE ANNUAL WELLNESS VISIT  11/17/2018  Telephone Visit Disclaimer This Medicare AWV was conducted by telephone due to national recommendations for restrictions regarding the COVID-19 Pandemic (e.g. social distancing).  I verified, using two identifiers, that I am speaking with Debra Miranda or their authorized healthcare agent. I discussed the limitations, risks, security, and privacy concerns of performing an evaluation and management service by telephone and the potential availability of an in-person appointment in the future. The patient expressed understanding and agreed to proceed.   Subjective:  [name] is a 73 y.o. female patient of Debra Norlander, DO who had a Medicare Annual Wellness Visit today via telephone. Debra Miranda is Retired and lives with an adult companion. she has 1 child. she reports that she is socially active and does interact with friends/family regularly. she is minimally physically active and enjoys reading and bird watching. She used to love going hiking but since COVID-19 and her recent cancer and surgery she hasn't been able to go hiking but wants to start doing that again soon.  Patient Care Team: Debra Norlander, DO as PCP - General (Family Medicine)  Advanced Directives 11/17/2018 11/07/2018 07/19/2018 06/06/2018 04/12/2018 04/05/2018 03/23/2018  Does Patient Have a Medical Advance Directive? Yes Yes Yes Yes Yes Yes Yes  Type of Paramedic of [place];Living will Living will [place];Living will Living will [place];Living will [place];Living will [place];Living will  Does patient want to make changes to medical advance directive? No - Patient declined - - - No - Patient declined - -  Copy of [place] in Chart? Yes - validated most recent copy scanned in chart (See row information) - Yes - validated most recent copy scanned in  chart (See row information) - No - copy requested No - copy requested Yes - validated most recent copy scanned in chart (See row information)  Would patient like information on creating a medical advance directive? - - - - - [facility] Utilization Over the Past 12 Months: # of hospitalizations or ER visits: 0 # of surgeries: 2  Review of Systems    Patient reports that her overall health is unchanged compared to last year.  History obtained from chart review  Patient Reported Readings (BP, Pulse, CBG, Weight, etc) none  Pain Assessment Pain : No/denies pain Pain Onset: (2007)     Current Medications & Allergies (verified) Allergies as of 11/17/2018      Reactions   Latex Itching      Medication List       Accurate as of November 17, 2018  2:12 PM. If you have any questions, ask your nurse or doctor.        STOP taking these medications   fluconazole 150 MG tablet Commonly known as: DIFLUCAN     TAKE these medications   atorvastatin 20 MG tablet Commonly known as: LIPITOR TAKE 1 TABLET BY MOUTH EVERY DAY   AZO BLADDER CONTROL/GO-LESS PO Take by mouth.   CALCIUM 1200 PO Take 1 tablet by mouth at bedtime.   cetirizine 10 MG tablet Commonly known as: ZYRTEC Take 10 mg by mouth daily.   escitalopram 10 MG tablet Commonly known as: LEXAPRO Take 1 tablet (10 mg total) by mouth daily. What changed: when to take this   metFORMIN 500 MG 24 hr tablet Commonly known as: GLUCOPHAGE-XR TAKE 1 TABLET BY MOUTH  EVERY DAY WITH BREAKFAST   nystatin powder Commonly known as: MYCOSTATIN/NYSTOP Apply topically 2 (two) times daily.   One-A-Day VitaCraves Chew Chew by mouth.   sitaGLIPtin 100 MG tablet Commonly known as: JANUVIA Take 100 mg by mouth at bedtime.   Vitamin D 50 MCG (2000 UT) Caps Take 2,000 Units by mouth at bedtime.       History (reviewed): Past Medical History:  Diagnosis Date  . Arthritis    right hand  . Cataract    RIGHT EYE; now  removed  . Depression    many years ago, no meds  . Diabetes mellitus without complication ([place])    type 2  . Endometrial adenocarcinoma ([place]) 01/05/2018   treated with HDR  . Environmental allergies   . Family history of breast cancer   . Family history of kidney cancer   . Family history of pancreatic cancer   . Family history of stomach cancer   . Family history of uterine cancer   . Hemorrhoids   . Nodular basal cell carcinoma ([facility]) 08/2017   LEFT LOWER LEG above foot  . Orthostatic hypotension    Patient is unaware of this diagnosis   Past Surgical History:  Procedure Laterality Date  . ABDOMINAL HYSTERECTOMY     Dr. Gerarda Fraction 02-08-18   . CATARACT EXTRACTION     RIGHT EYE  . HYSTEROSCOPY W/D&C N/A 01/05/2018   Procedure: DILATATION AND CURETTAGE/HYSTEROSCOPY;  Surgeon: Florian Buff, MD;  Location: [facility];  Service: Gynecology;  Laterality: N/A;  . LUMBAR [place] SURGERY  2006  . POLYPECTOMY N/A 01/05/2018   Procedure: ENDOMETRIAL POLYPECTOMY;  Surgeon: Florian Buff, MD;  Location: [facility];  Service: Gynecology;  Laterality: N/A;  . ROBOTIC ASSISTED TOTAL HYSTERECTOMY WITH BILATERAL SALPINGO OOPHERECTOMY Bilateral 02/08/2018   Procedure: XI ROBOTIC ASSISTED TOTAL HYSTERECTOMY WITH BILATERAL SALPINGO OOPHORECTOMY;  Surgeon: Isabel Caprice, MD;  Location: [facility];  Service: Gynecology;  Laterality: Bilateral;  . SENTINEL NODE BIOPSY Bilateral 02/08/2018   Procedure: SENTINEL NODE BIOPSY AND PELVIC LYMPH NODE SAMPLING;  Surgeon: Isabel Caprice, MD;  Location: [facility];  Service: Gynecology;  Laterality: Bilateral;  . TONSILLECTOMY     as a child   Family History  Problem Relation Age of Onset  . Cervical cancer Mother 62       treated with radiation  . Pancreatic cancer Mother 64  . Stroke Father   . Pulmonary fibrosis Brother   . Alzheimer's disease Brother   . Stomach cancer Brother        dx 55s  . Diabetes Brother   . Lung cancer Maternal Grandmother   . Stroke  Paternal Grandfather   . Uterine cancer Maternal Aunt        dx 44s  . Kidney cancer Maternal Uncle        dx 10s  . Colon cancer Maternal Uncle        dx 44s  . Breast cancer Cousin        maternal cousin dx 48   Social History   Socioeconomic History  . Marital status: Soil scientist    Spouse name: Debra Miranda  . Number of children: 1  . Years of education: 72  . Highest education level: Associate degree: occupational, Hotel manager, or vocational program  Occupational History  . Occupation: Retired    Comment: Data processing manager Needs  . Financial resource strain: Not hard at all  . Food insecurity    Worry: Never true  Inability: Never true  . Transportation needs    Medical: No    Non-medical: No  Tobacco Use  . Smoking status: Current Every Day Smoker    Years: 45.00    Types: Cigars  . Smokeless tobacco: Never Used  . Tobacco comment: 8 small cigars a day  Substance and Sexual Activity  . Alcohol use: Not Currently  . Drug use: No  . Sexual activity: Not Currently  Lifestyle  . Physical activity    Days per week: 0 days    Minutes per session: 0 min  . Stress: Only a little  Relationships  . Social connections    Talks on phone: More than three times a week    Gets together: More than three times a week    Attends religious service: More than 4 times per year    Active member of club or organization: Yes    Attends meetings of clubs or organizations: Never    Relationship status: Married  Other Topics Concern  . Not on file  Social History Narrative  . Not on file    Activities of Daily Living In your present state of health, do you have any difficulty performing the following activities: 11/17/2018 02/08/2018  Hearing? N N  Vision? N N  Comment needs to schedule her Diabetic Eye Exam -  Difficulty concentrating or making decisions? N N  Walking or climbing stairs? Y N  Comment sometimes when her back is hurting-she doesn't have to use a cane  or walker -  Dressing or bathing? N Y  Doing errands, shopping? N N  Preparing Food and eating ? N -  Using the Toilet? N -  In the past six months, have you accidently leaked urine? Y -  Comment wears a pad all the time-gets up multiple times at night to go to the bathroom -  Do you have problems with loss of bowel control? N -  Managing your Medications? N -  Managing your Finances? N -  Housekeeping or managing your Housekeeping? N -  Some recent data might be hidden    Patient Education/ Literacy How often do you need to have someone help you when you read instructions, pamphlets, or other written materials from your doctor or pharmacy?: 1 - Never What is the last grade level you completed in school?: Associates Degree  Exercise Current Exercise Habits: The patient does not participate in regular exercise at present, Exercise limited by: orthopedic condition(s)  Diet Patient reports consuming 2 meals a day and 1 snack(s) a day Patient reports that her primary diet is: Regular Patient reports that she does have regular access to food.   Depression Screen PHQ 2/9 Scores 11/17/2018 03/08/2018 12/06/2017 08/30/2017 08/02/2017 07/29/2017 03/10/2017  PHQ - 2 Score 0 0 0 0 0 0 0  PHQ- 9 Score - 0 - - - - -     Fall Risk Fall Risk  11/17/2018 03/08/2018 12/06/2017 08/30/2017 07/29/2017  Falls in the past year? 0 0 No No Yes  Number falls in past yr: 0 - - - 1  Injury with Fall? 0 - - - No  Risk for fall due to : - - - - History of fall(s)  Risk for fall due to: Comment - - - - works out in [employer] and yard a lot and has tripped over logs  Follow up Falls prevention discussed - - - Falls prevention discussed  Comment Get rid of all throw rugs in the  house, adequate lighting in the walkways and grab bars in the bathroom - - - -     Objective:  Debra Miranda seemed alert and oriented and she participated appropriately during our telephone visit.  Blood Pressure Weight BMI  BP  Readings from Last 3 Encounters:  11/07/18 (!) 157/93  07/19/18 130/64  06/06/18 130/80   Wt Readings from Last 3 Encounters:  11/07/18 190 lb 6.4 oz (86.4 kg)  07/19/18 190 lb 12.8 oz (86.5 kg)  04/12/18 188 lb (85.3 kg)   BMI Readings from Last 1 Encounters:  11/07/18 29.82 kg/m    *Unable to obtain current vital signs, weight, and BMI due to telephone visit type  Hearing/Vision  . Darcella did not seem to have difficulty with hearing/understanding during the telephone conversation . Reports that she has not had a formal eye exam by an eye care professional within the past year . Reports that she has not had a formal hearing evaluation within the past year *Unable to fully assess hearing and vision during telephone visit type  Cognitive Function: 6CIT Screen 11/17/2018  What Year? 0 points  What month? 0 points  What time? 0 points  Count back from 20 0 points  Months in reverse 0 points  Repeat phrase 0 points  Total Score 0   (Normal:0-7, Significant for Dysfunction: >8)  Normal Cognitive Function Screening: Yes   Immunization & Health Maintenance Record Immunization History  Administered Date(s) Administered  . Fluad Quad(high Dose 65+) 11/15/2018  . Influenza, High Dose Seasonal PF 11/29/2015, 11/18/2016, 12/06/2017  . Influenza,inj,Quad PF,6+ Mos 12/07/2013, 11/12/2014  . Pneumococcal Conjugate-13 03/17/2013  . Pneumococcal Polysaccharide-23 07/29/2017, 02/09/2018    Health Maintenance  Topic Date Due  . Samul Dada  04/03/1966  . COLONOSCOPY  04/03/1997  . OPHTHALMOLOGY EXAM  08/06/2017  . DEXA SCAN  07/28/2018  . MAMMOGRAM  07/29/2018  . FOOT EXAM  08/31/2018  . URINE MICROALBUMIN  08/31/2018  . HEMOGLOBIN A1C  09/06/2018  . COLON CANCER SCREENING ANNUAL FOBT  10/26/2018  . INFLUENZA VACCINE  Completed  . Hepatitis C Screening  Completed  . PNA vac Low Risk Adult  Completed       Assessment  This is a routine wellness examination for [employer].  Health Maintenance: Due or Overdue Health Maintenance Due  Topic Date Due  . TETANUS/TDAP  04/03/1966  . COLONOSCOPY  04/03/1997  . OPHTHALMOLOGY EXAM  08/06/2017  . DEXA SCAN  07/28/2018  . MAMMOGRAM  07/29/2018  . FOOT EXAM  08/31/2018  . URINE MICROALBUMIN  08/31/2018  . HEMOGLOBIN A1C  09/06/2018  . COLON CANCER SCREENING ANNUAL FOBT  10/26/2018    Hortencia Conradi Ardinger does not need a referral for Community Assistance: Care Management:   no Social Work:    no Prescription Assistance:  no Nutrition/Diabetes Education:  no   Plan:  Personalized Goals Goals Addressed            This Visit's Progress   . DIET - INCREASE WATER INTAKE       Try to drink 6-8 glasses of water daily.      Personalized Health Maintenance & Screening Recommendations  TDAP, Shingles vaccine, Mammogram, DEXA, Colonoscopy and Diabetic Eye Exam  Lung Cancer Screening Recommended: no (Low Dose CT Chest recommended if Age 79-80 years, 30 pack-year currently smoking OR have quit w/in past 15 years) Hepatitis C Screening recommended: no HIV Screening recommended: no  Advanced Directives: Written information was not prepared per  patient's request.  Referrals & Orders No orders of the defined types were placed in this encounter.   Follow-up Plan . Follow-up with Debra Norlander, DO as planned . Schedule your Mammogram,DEXA and Colonoscopy as discussed . Schedule your Diabetic Eye Exam as discussed . Consider TDAP and Shingles vaccine at your next visit with your PCP   I have personally reviewed and noted the following in the patient's chart:   . Medical and social history . Use of alcohol, tobacco or illicit drugs  . Current medications and supplements . Functional ability and status . Nutritional status . Physical activity . Advanced directives . List of other physicians . Hospitalizations, surgeries, and ER visits in previous 12 months . Vitals . Screenings to include  cognitive, depression, and falls . Referrals and appointments  In addition, I have reviewed and discussed with Debra Miranda certain preventive protocols, quality metrics, and best practice recommendations. A written personalized care plan for preventive services as well as general preventive health recommendations is available and can be mailed to the patient at her request.      Milas Hock, LPN  [id number]

## 2018-11-17 NOTE — Patient Instructions (Signed)
Preventive Care 71 Years and Older, Female Preventive care refers to lifestyle choices and visits with your health care provider that can promote health and wellness. This includes:  A yearly physical exam. This is also called an annual well check.  Regular dental and eye exams.  Immunizations.  Screening for certain conditions.  Healthy lifestyle choices, such as diet and exercise. What can I expect for my preventive care visit? Physical exam Your health care provider will check:  Height and weight. These may be used to calculate body mass index (BMI), which is a measurement that tells if you are at a healthy weight.  Heart rate and blood pressure.  Your skin for abnormal spots. Counseling Your health care provider may ask you questions about:  Alcohol, tobacco, and drug use.  Emotional well-being.  Home and relationship well-being.  Sexual activity.  Eating habits.  History of falls.  Memory and ability to understand (cognition).  Work and work Statistician.  Pregnancy and menstrual history. What immunizations do I need?  Influenza (flu) vaccine  This is recommended every year. Tetanus, diphtheria, and pertussis (Tdap) vaccine  You may need a Td booster every 10 years. Varicella (chickenpox) vaccine  You may need this vaccine if you have not already been vaccinated. Zoster (shingles) vaccine  You may need this after age 71. Pneumococcal conjugate (PCV13) vaccine  One dose is recommended after age 71. Pneumococcal polysaccharide (PPSV23) vaccine  One dose is recommended after age 72. Measles, mumps, and rubella (MMR) vaccine  You may need at least one dose of MMR if you were born in 1957 or later. You may also need a second dose. Meningococcal conjugate (MenACWY) vaccine  You may need this if you have certain conditions. Hepatitis A vaccine  You may need this if you have certain conditions or if you travel or work in places where you may be exposed  to hepatitis A. Hepatitis B vaccine  You may need this if you have certain conditions or if you travel or work in places where you may be exposed to hepatitis B. Haemophilus influenzae type b (Hib) vaccine  You may need this if you have certain conditions. You may receive vaccines as individual doses or as more than one vaccine together in one shot (combination vaccines). Talk with your health care provider about the risks and benefits of combination vaccines. What tests do I need? Blood tests  Lipid and cholesterol levels. These may be checked every 5 years, or more frequently depending on your overall health.  Hepatitis C test.  Hepatitis B test. Screening  Lung cancer screening. You may have this screening every year starting at age 71 if you have a 30-pack-year history of smoking and currently smoke or have quit within the past 15 years.  Colorectal cancer screening. All adults should have this screening starting at age 71 and continuing until age 15. Your health care provider may recommend screening at age 23 if you are at increased risk. You will have tests every 1-10 years, depending on your results and the type of screening test.  Diabetes screening. This is done by checking your blood sugar (glucose) after you have not eaten for a while (fasting). You may have this done every 1-3 years.  Mammogram. This may be done every 1-2 years. Talk with your health care provider about how often you should have regular mammograms.  BRCA-related cancer screening. This may be done if you have a family history of breast, ovarian, tubal, or peritoneal cancers.  Other tests  Sexually transmitted disease (STD) testing.  Bone density scan. This is done to screen for osteoporosis. You may have this done starting at age 71. Follow these instructions at home: Eating and drinking  Eat a diet that includes fresh fruits and vegetables, whole grains, lean protein, and low-fat dairy products. Limit  your intake of foods with high amounts of sugar, saturated fats, and salt.  Take vitamin and mineral supplements as recommended by your health care provider.  Do not drink alcohol if your health care provider tells you not to drink.  If you drink alcohol: ? Limit how much you have to 0-1 drink a day. ? Be aware of how much alcohol is in your drink. In the [place] one drink equals one 12 oz bottle of beer (355 mL), one 5 oz glass of wine (148 mL), or one 1 oz glass of hard liquor (44 mL). Lifestyle  Take daily care of your teeth and gums.  Stay active. Exercise for at least 30 minutes on 5 or more days each week.  Do not use any products that contain nicotine or tobacco, such as cigarettes, e-cigarettes, and chewing tobacco. If you need help quitting, ask your health care provider.  If you are sexually active, practice safe sex. Use a condom or other form of protection in order to prevent STIs (sexually transmitted infections).  Talk with your health care provider about taking a low-dose aspirin or statin. What's next?  Go to your health care provider once a year for a well check visit.  Ask your health care provider how often you should have your eyes and teeth checked.  Stay up to date on all vaccines. This information is not intended to replace advice given to you by your health care provider. Make sure you discuss any questions you have with your health care provider. Document Released: 03/01/2015 Document Revised: 01/27/2018 Document Reviewed: 01/27/2018 Elsevier Patient Education  2020 [employer].

## 2018-11-20 ENCOUNTER — Other Ambulatory Visit: Payer: Self-pay | Admitting: Family Medicine

## 2018-12-02 ENCOUNTER — Encounter: Payer: Self-pay | Admitting: Family Medicine

## 2018-12-04 ENCOUNTER — Other Ambulatory Visit: Payer: Self-pay | Admitting: Family Medicine

## 2018-12-09 ENCOUNTER — Other Ambulatory Visit: Payer: Self-pay | Admitting: Family Medicine

## 2018-12-09 DIAGNOSIS — E785 Hyperlipidemia, unspecified: Secondary | ICD-10-CM

## 2018-12-10 ENCOUNTER — Other Ambulatory Visit: Payer: Self-pay | Admitting: Family Medicine

## 2018-12-12 NOTE — Telephone Encounter (Signed)
Ov 12/27/18

## 2018-12-27 ENCOUNTER — Ambulatory Visit (INDEPENDENT_AMBULATORY_CARE_PROVIDER_SITE_OTHER): Payer: Medicare PPO | Admitting: Family Medicine

## 2018-12-27 ENCOUNTER — Encounter: Payer: Self-pay | Admitting: Family Medicine

## 2018-12-27 ENCOUNTER — Other Ambulatory Visit: Payer: Self-pay

## 2018-12-27 VITALS — BP 130/75 | HR 85 | Temp 97.4°F | Ht 67.0 in | Wt 188.4 lb

## 2018-12-27 DIAGNOSIS — E1165 Type 2 diabetes mellitus with hyperglycemia: Secondary | ICD-10-CM

## 2018-12-27 DIAGNOSIS — C541 Malignant neoplasm of endometrium: Secondary | ICD-10-CM

## 2018-12-27 DIAGNOSIS — Z1211 Encounter for screening for malignant neoplasm of colon: Secondary | ICD-10-CM | POA: Diagnosis not present

## 2018-12-27 DIAGNOSIS — L84 Corns and callosities: Secondary | ICD-10-CM | POA: Diagnosis not present

## 2018-12-27 DIAGNOSIS — E785 Hyperlipidemia, unspecified: Secondary | ICD-10-CM

## 2018-12-27 DIAGNOSIS — E1169 Type 2 diabetes mellitus with other specified complication: Secondary | ICD-10-CM

## 2018-12-27 DIAGNOSIS — E119 Type 2 diabetes mellitus without complications: Secondary | ICD-10-CM | POA: Diagnosis not present

## 2018-12-27 LAB — BAYER DCA HB A1C WAIVED: HB A1C (BAYER DCA - WAIVED): >14 % — ABNORMAL HIGH (ref <7.0)

## 2018-12-27 MED ORDER — ESCITALOPRAM OXALATE 10 MG PO TABS: 10.0000 mg | ORAL_TABLET | Freq: Every day | ORAL | 3 refills | Status: DC

## 2018-12-27 MED ORDER — JARDIANCE 10 MG PO TABS: 10.0000 mg | ORAL_TABLET | Freq: Every day | ORAL | 0 refills | Status: DC

## 2018-12-27 MED ORDER — ATORVASTATIN CALCIUM 20 MG PO TABS: 20.0000 mg | ORAL_TABLET | Freq: Every day | ORAL | 3 refills | Status: DC

## 2018-12-27 NOTE — Patient Instructions (Addendum)
Call me tomorrow about the metformin.  If the XR is back then ok to go back on that.  If NOT, I will replace it with an alternative.  Bring your fecal card back to the office as soon as possible  Get your eye exam done and have results sent to me

## 2018-12-27 NOTE — Progress Notes (Signed)
Subjective: CC: DM2 PCP: Debra Norlander, DO JEH:UDJSH C Mitter is a 72 y.o. female presenting to clinic today for:  1. Type 2 Diabetes w/ HLD Patient reports compliance w Januvia 171m,  Lipitor 247m  She gets the Januvia through the manufacturer patient assistance program.  Unfortunately, she discontinued the Metformin due to a recall on the extended release.  She was unable to tolerate the immediate release due to severe diarrhea but felt that she was doing pretty good on the extended release.  She has not followed a diet nor stayed very physically active due to COVID-19.    Last eye exam: Sees Dr. BoDeon Pilling Has not yet scheduled an appointment. Last foot exam: Due Last A1c:  Lab Results  Component Value Date   HGBA1C 7.8 (H) 03/08/2018   Nephropathy screen indicated?: Due, Negative 08/2017 Last flu, zoster and/or pneumovax: Flu needed  ROS: Denies any visual disturbance, excessive thirst, unplanned weight loss, nausea, vomiting or abdominal pain.  2.  Uterine cancer Stage Ia, grade 2 endometrial adenocarcinoma s/p hysterectomy 01/2018 and vaginal brachytherapy 04/2018.  She sw Dr GeAlycia Rossettitreated for candida) in June and Dr KiSondra Comen September.  She was given a good checkup.  She has a follow-up with her specialist again next month.  ROS: Per HPI  Allergies  Allergen Reactions  . Latex Itching   Past Medical History:  Diagnosis Date  . Arthritis    right hand  . Cataract    RIGHT EYE; now removed  . Depression    many years ago, no meds  . Diabetes mellitus without complication ([facility] [place]   type 2  . [place]ometrial adenocarcinoma ([facility]   treated with HDR  . Environmental allergies   . Family history of breast cancer   . Family history of kidney cancer   . Family history of pancreatic cancer   . Family history of stomach cancer   . Family history of uterine cancer   . Hemorrhoids   . Nodular basal cell carcinoma (BCC) 08/2017   LEFT LOWER LEG above foot   . Orthostatic hypotension    Patient is unaware of this diagnosis    Current Outpatient Medications:  .  atorvastatin (LIPITOR) 20 MG tablet, TAKE 1 TABLET BY MOUTH EVERY DAY, Disp: 90 tablet, Rfl: 0 .  Calcium Carbonate-Vit D-Min (CALCIUM 1200 PO), Take 1 tablet by mouth at bedtime. , Disp: , Rfl:  .  cetirizine (ZYRTEC) 10 MG tablet, Take 10 mg by mouth daily., Disp: , Rfl:  .  Cholecalciferol (VITAMIN D) 2000 UNITS CAPS, Take 2,000 Units by mouth at bedtime. , Disp: , Rfl:  .  escitalopram (LEXAPRO) 10 MG tablet, TAKE 1 TABLET BY MOUTH EVERY DAY, Disp: 90 tablet, Rfl: 0 .  metFORMIN (GLUCOPHAGE-XR) 500 MG 24 hr tablet, TAKE 1 TABLET BY MOUTH EVERY DAY WITH BREAKFAST, Disp: 30 tablet, Rfl: 0 .  Multiple Vitamins-Minerals (ONE-A-DAY VITACRAVES) CHEW, Chew by mouth., Disp: , Rfl:  .  nystatin (MYCOSTATIN/NYSTOP) powder, Apply topically 2 (two) times daily., Disp: 60 g, Rfl: 2 .  Pumpkin Seed-Soy Germ (AZO BLADDER CONTROL/GO-LESS PO), Take by mouth., Disp: , Rfl:  .  sitaGLIPtin (JANUVIA) 100 MG tablet, Take 100 mg by mouth at bedtime. , Disp: , Rfl:  Social History   Socioeconomic History  . Marital status: DoSoil scientist  Spouse name: ViChristy Sartorius. Number of children: 1  . Years of education: 1444. Highest education level: Associate degree: occupational, technical,  or vocational program  Occupational History  . Occupation: Retired    Comment: Data processing manager Needs  . Financial resource strain: Not hard at all  . Food insecurity    Worry: Never true    Inability: Never true  . Transportation needs    Medical: No    Non-medical: No  Tobacco Use  . Smoking status: Current Every Day Smoker    Years: 45.00    Types: Cigars  . Smokeless tobacco: Never Used  . Tobacco comment: 8 small cigars a day  Substance and Sexual Activity  . Alcohol use: Not Currently  . Drug use: No  . Sexual activity: Not Currently  Lifestyle  . Physical activity    Days per week: 0 days     Minutes per session: 0 min  . Stress: Only a little  Relationships  . Social connections    Talks on phone: More than three times a week    Gets together: More than three times a week    Attends religious service: More than 4 times per year    Active member of club or organization: Yes    Attends meetings of clubs or organizations: Never    Relationship status: Married  . Intimate partner violence    Fear of current or ex partner: No    Emotionally abused: No    Physically abused: No    Forced sexual activity: No  Other Topics Concern  . Not on file  Social History Narrative  . Not on file   Family History  Problem Relation Age of Onset  . Cervical cancer Mother 14       treated with radiation  . Pancreatic cancer Mother 22  . Stroke Father   . Pulmonary fibrosis Brother   . Alzheimer's disease Brother   . Stomach cancer Brother        dx 88s  . Diabetes Brother   . Lung cancer Maternal Grandmother   . Stroke Paternal Grandfather   . Uterine cancer Maternal Aunt        dx 61s  . Kidney cancer Maternal Uncle        dx 36s  . Colon cancer Maternal Uncle        dx 71s  . Breast cancer Cousin        maternal cousin dx 41    Objective: Office vital signs reviewed. BP 130/75   Pulse 85   Temp (!) 97.4 F (36.3 C) (Temporal)   Ht _0  (1.702 m)   Wt 188 lb 6.4 oz (85.5 kg)   SpO2 96%   BMI 29.51 kg/m   Physical Examination:  General: Awake, alert, well appearing, No acute distress HEENT: sclera white, MMM Cardio: regular rate and rhythm, S1S2 heard, no murmurs appreciated Pulm: clear to auscultation bilaterally, no wheezes, rhonchi or rales; normal work of breathing on room air Extremities: warm, No edema, cyanosis or clubbing; +1 pedal pulses bilaterally Neuro: see DM foot Diabetic Foot Exam - Simple   Simple Foot Form Diabetic Foot exam was performed with the following findings: Yes 12/27/2018  4:40 PM  Visual Inspection See comments: Yes Sensation  Testing Intact to touch and monofilament testing bilaterally: Yes Pulse Check Posterior Tibialis and Dorsalis pulse intact bilaterally: Yes Comments Preulcerative calluses noted along the lateral foot at the fifth MTP joint and along the first MTP joint of the left foot.  Right foot with similar but not as severe.     Assessment/  Plan: 73 y.o. female   1. Uncontrolled type 2 diabetes mellitus with hyperglycemia ([place]) Blood sugar has worsened quite a bit since her last visit.  Her A1c was greater than 14 today.  This is up from 7.8 at her last visit.  She is on Januvia at max dose but unfortunately there was a recall on her metformin extended release and she does not tolerate the immediate release.  I have gone ahead and sent in Jardiance 10 mg plans to increase to 25 mg in about a month.  If she is able to pick up the Metformin extended release then I would encourage her to do that as well.  May need to consider prescribing Januvia as Janumet and see if we can get this through the manufacturer. - Microalbumin / creatinine urine ratio - Bayer DCA Hb A1c Waived  2. Hyperlipidemia associated with type 2 diabetes mellitus (HCC) Continue statin. - CMP14+EGFR - atorvastatin (LIPITOR) 20 MG tablet; Take 1 tablet (20 mg total) by mouth daily.  Dispense: 90 tablet; Refill: 3  3. Endometrial adenocarcinoma (HCC) Stable.  Continue follow-up with specialist as directed - CMP14+EGFR - CBC  4. Colon cancer screening - Fecal occult blood, imunochemical; Future  5. Callus of foot Diabetic foot exam with preulcerative calluses bilaterally left greater than right.  I have prescribed her diabetic shoe.  Encouraged her to see podiatry but she was reluctant to do this today.   Orders Placed This Encounter  Procedures  . Fecal occult blood, imunochemical    Standing Status:   Future    Standing Expiration Date:   02/26/2019  . Microalbumin / creatinine urine ratio  . Bayer DCA Hb A1c Waived  .  CMP14+EGFR  . CBC   Meds ordered this encounter  Medications  . atorvastatin (LIPITOR) 20 MG tablet    Sig: Take 1 tablet (20 mg total) by mouth daily.    Dispense:  90 tablet    Refill:  3  . escitalopram (LEXAPRO) 10 MG tablet    Sig: Take 1 tablet (10 mg total) by mouth daily.    Dispense:  90 tablet    Refill:  3  . empagliflozin (JARDIANCE) 10 MG TABS tablet    Sig: Take 10 mg by mouth daily before breakfast. x1 month.  Then pick up the 25 mg tablets.    Dispense:  30 tablet    Refill:  [place], DO [place] [phone]

## 2018-12-28 ENCOUNTER — Telehealth: Payer: Self-pay | Admitting: Family Medicine

## 2018-12-28 LAB — CMP14+EGFR
ALT: 17 IU/L (ref 0–32)
AST: 15 IU/L (ref 0–40)
Albumin/Globulin Ratio: 2 (ref 1.2–2.2)
Albumin: 4.3 g/dL (ref 3.7–4.7)
Alkaline Phosphatase: 117 IU/L (ref 39–117)
BUN/Creatinine Ratio: 13 (ref 12–28)
BUN: 10 mg/dL (ref 8–27)
Bilirubin Total: 0.4 mg/dL (ref 0.0–1.2)
CO2: 21 mmol/L (ref 20–29)
Calcium: 9.5 mg/dL (ref 8.7–10.3)
Chloride: 99 mmol/L (ref 96–106)
Creatinine, Ser: 0.77 mg/dL (ref 0.57–1.00)
GFR calc Af Amer: 90 mL/min/{1.73_m2} (ref >59)
GFR calc non Af Amer: 78 mL/min/{1.73_m2} (ref >59)
Globulin, Total: 2.2 g/dL (ref 1.5–4.5)
Glucose: 575 mg/dL (ref 65–99)
Potassium: 4.6 mmol/L (ref 3.5–5.2)
Sodium: 136 mmol/L (ref 134–144)
Total Protein: 6.5 g/dL (ref 6.0–8.5)

## 2018-12-28 LAB — CBC
Hematocrit: 46.4 % (ref 34.0–46.6)
Hemoglobin: 15.5 g/dL (ref 11.1–15.9)
MCH: 29.4 pg (ref 26.6–33.0)
MCHC: 33.4 g/dL (ref 31.5–35.7)
MCV: 88 fL (ref 79–97)
Platelets: 129 10*3/uL — ABNORMAL LOW (ref 150–450)
RBC: 5.28 x10E6/uL (ref 3.77–5.28)
RDW: 13 % (ref 11.7–15.4)
WBC: 6.9 10*3/uL (ref 3.4–10.8)

## 2018-12-28 LAB — MICROALBUMIN / CREATININE URINE RATIO
Creatinine, Urine: 35.7 mg/dL
Microalb/Creat Ratio: 21 mg/g creat (ref 0–29)
Microalbumin, Urine: 7.4 ug/mL

## 2018-12-28 NOTE — Telephone Encounter (Signed)
Pt aware.

## 2019-01-02 ENCOUNTER — Other Ambulatory Visit: Payer: Medicare PPO

## 2019-01-02 DIAGNOSIS — Z1211 Encounter for screening for malignant neoplasm of colon: Secondary | ICD-10-CM

## 2019-01-04 LAB — FECAL OCCULT BLOOD, IMMUNOCHEMICAL: Fecal Occult Bld: NEGATIVE

## 2019-01-21 ENCOUNTER — Other Ambulatory Visit: Payer: Self-pay | Admitting: Family Medicine

## 2019-01-29 NOTE — Progress Notes (Signed)
Gynecologic Oncology Return Clinic Visit  01/31/19   Reason for Visit: surveillance visit in the setting of a history of stage IA gr2 EMCA  Treatment History: Oncology History  Endometrial adenocarcinoma ([place])  01/05/2018 Surgery   HSC D&C with findings showing a single polyp and grossly normal endometrium. The polyp returned with Grade 2 Endometrioid Adenocarcinoma "partially involving a polyp".   01/05/2018 Initial Diagnosis   Endometrial adenocarcinoma ([place])   02/03/2018 Genetic Testing   Patient has genetic testing with [facility]. Results revealed patient has the following mutation(s): none   02/08/2018 Surgery   Robotic assisted TLH/BSO with sentinel nodes and washings. Frozen section returned with no MI   02/08/2018 Pathologic Stage   IA grade 2, endometrioid, no MI, negative LNs, no LVSI   04/05/2018 - 05/04/2018 Radiation Therapy   Vaginal brachytherapy, Iridium HDR, 3 cm cylinder, tx length of 3 cm / 30 Gy in 5 fractions.     Interval History: Saw Dr. Sondra Come radiation oncology last in late September.  She was NED at that time.  Given recurrent yeast infection symptoms, she was prescribed nystatin powder for external use and Diflucan.  Patient presents today overall doing well.  She notes continued use of vaginal dilators 2-3 times a week which she thinks has helped some.  She denies any vaginal, pelvic or abdominal pain.  She started taking over-the-counter Azo which has helped significantly with her frequency and urgency symptoms.  She has a history of constipation, which she notes has improved over the last month.  She endorses a good appetite without nausea, vomiting, or early satiety.  She denies any vaginal bleeding or discharge.  She continues to have some issues related to her arthritis, notably pain in her right hand and lower back.  She denies any other changes to her medical or surgical history.  She denies any new medical problems in her attempts.  Past  Medical/Surgical History: Past Medical History:  Diagnosis Date  . Arthritis    right hand  . Cataract    RIGHT EYE; now removed  . Depression    many years ago, no meds  . Diabetes mellitus without complication ([place])    type 2  . Endometrial adenocarcinoma ([place]) 01/05/2018   treated with HDR  . Environmental allergies   . Family history of breast cancer   . Family history of kidney cancer   . Family history of pancreatic cancer   . Family history of stomach cancer   . Family history of uterine cancer   . Hemorrhoids   . Nodular basal cell carcinoma ([facility]) 08/2017   LEFT LOWER LEG above foot  . Orthostatic hypotension    Patient is unaware of this diagnosis    Past Surgical History:  Procedure Laterality Date  . ABDOMINAL HYSTERECTOMY     Dr. Gerarda Fraction 02-08-18   . CATARACT EXTRACTION     RIGHT EYE  . HYSTEROSCOPY W/D&C N/A 01/05/2018   Procedure: DILATATION AND CURETTAGE/HYSTEROSCOPY;  Surgeon: Florian Buff, MD;  Location: [facility];  Service: Gynecology;  Laterality: N/A;  . LUMBAR [place] SURGERY  2006  . POLYPECTOMY N/A 01/05/2018   Procedure: ENDOMETRIAL POLYPECTOMY;  Surgeon: Florian Buff, MD;  Location: [facility];  Service: Gynecology;  Laterality: N/A;  . ROBOTIC ASSISTED TOTAL HYSTERECTOMY WITH BILATERAL SALPINGO OOPHERECTOMY Bilateral 02/08/2018   Procedure: XI ROBOTIC ASSISTED TOTAL HYSTERECTOMY WITH BILATERAL SALPINGO OOPHORECTOMY;  Surgeon: Isabel Caprice, MD;  Location: [facility];  Service: Gynecology;  Laterality: Bilateral;  . SENTINEL  NODE BIOPSY Bilateral 02/08/2018   Procedure: SENTINEL NODE BIOPSY AND PELVIC LYMPH NODE SAMPLING;  Surgeon: Isabel Caprice, MD;  Location: [facility];  Service: Gynecology;  Laterality: Bilateral;  . TONSILLECTOMY     as a child    Family History  Problem Relation Age of Onset  . Cervical cancer Mother 78       treated with radiation  . Pancreatic cancer Mother 62  . Stroke Father   . Pulmonary fibrosis Brother   . Alzheimer's  disease Brother   . Stomach cancer Brother        dx 105s  . Diabetes Brother   . Lung cancer Maternal Grandmother   . Stroke Paternal Grandfather   . Uterine cancer Maternal Aunt        dx 39s  . Kidney cancer Maternal Uncle        dx 100s  . Colon cancer Maternal Uncle        dx 15s  . Breast cancer Cousin        maternal cousin dx 59    Social History   Socioeconomic History  . Marital status: Soil scientist    Spouse name: Debra Miranda  . Number of children: 1  . Years of education: 62  . Highest education level: Associate degree: occupational, Hotel manager, or vocational program  Occupational History  . Occupation: Retired    Comment: [employer]  Tobacco Use  . Smoking status: Current Every Day Smoker    Years: 45.00    Types: Cigars  . Smokeless tobacco: Never Used  . Tobacco comment: 8 small cigars a day  Substance and Sexual Activity  . Alcohol use: Not Currently  . Drug use: No  . Sexual activity: Not Currently  Other Topics Concern  . Not on file  Social History Narrative  . Not on file   Social Determinants of Health   Financial Resource Strain:   . Difficulty of Paying Living Expenses: Not on file  Food Insecurity:   . Worried About Charity fundraiser in the Last Year: Not on file  . Ran Out of Food in the Last Year: Not on file  Transportation Needs:   . Lack of Transportation (Medical): Not on file  . Lack of Transportation (Non-Medical): Not on file  Physical Activity: Inactive  . Days of Exercise per Week: 0 days  . Minutes of Exercise per Session: 0 min  Stress:   . Feeling of Stress : Not on file  Social Connections:   . Frequency of Communication with Friends and Family: Not on file  . Frequency of Social Gatherings with Friends and Family: Not on file  . Attends Religious Services: Not on file  . Active Member of Clubs or Organizations: Not on file  . Attends Archivist Meetings: Not on file  . Marital Status: Not on file     Current Medications:  Current Outpatient Medications:  .  atorvastatin (LIPITOR) 20 MG tablet, Take 1 tablet (20 mg total) by mouth daily., Disp: 90 tablet, Rfl: 3 .  Bacillus Coagulans-Inulin (PROBIOTIC) 1-250 BILLION-MG CAPS, Take by mouth., Disp: , Rfl:  .  Calcium Carbonate-Vit D-Min (CALCIUM 1200 PO), Take 1 tablet by mouth at bedtime. , Disp: , Rfl:  .  cetirizine (ZYRTEC) 10 MG tablet, Take 10 mg by mouth daily., Disp: , Rfl:  .  Cholecalciferol (VITAMIN D) 2000 UNITS CAPS, Take 2,000 Units by mouth at bedtime. , Disp: , Rfl:  .  empagliflozin (  JARDIANCE) 10 MG TABS tablet, Take 10 mg by mouth daily before breakfast. x1 month.  Then pick up the 25 mg tablets., Disp: 30 tablet, Rfl: 0 .  escitalopram (LEXAPRO) 10 MG tablet, Take 1 tablet (10 mg total) by mouth daily., Disp: 90 tablet, Rfl: 3 .  Ferrous Sulfate (IRON) 90 (18 Fe) MG TABS, Take by mouth., Disp: , Rfl:  .  Multiple Vitamins-Minerals (ONE-A-DAY VITACRAVES) CHEW, Chew by mouth., Disp: , Rfl:  .  nystatin (MYCOSTATIN/NYSTOP) powder, Apply topically 2 (two) times daily., Disp: 60 g, Rfl: 2 .  Pumpkin Seed-Soy Germ (AZO BLADDER CONTROL/GO-LESS PO), Take by mouth., Disp: , Rfl:  .  sitaGLIPtin (JANUVIA) 100 MG tablet, Take 100 mg by mouth at bedtime. , Disp: , Rfl:   Review of Symptoms: Pertinent positives as per HPI. Denies appetite changes, fevers, chills, fatigue, unexplained weight changes. Denies hearing loss, neck lumps or masses, mouth sores, ringing in ears or voice changes. Denies cough or wheezing.  Denies shortness of breath. Denies chest pain or palpitations. Denies leg swelling. Denies abdominal distention, pain, blood in stools, constipation, diarrhea, nausea, vomiting, or early satiety. Denies pain with intercourse, dysuria, frequency, hematuria or incontinence. Denies hot flashes, pelvic pain, vaginal bleeding or vaginal discharge.   Denies muscle pain/cramps. Denies itching, rash, or wounds. Denies  dizziness, headaches, numbness or seizures. Denies swollen lymph nodes or glands, denies easy bruising or bleeding. Denies anxiety, depression, confusion, or decreased concentration.  Physical Exam: BP 109/83 (BP Location: Right Arm, Patient Position: Sitting)   Pulse 78   Temp 97.8 F (36.6 C) (Temporal)   Resp 18   Ht 5\' 7"  (1.702 m)   Wt 187 lb (84.8 kg)   SpO2 98%   BMI 29.29 kg/m  General: Alert, oriented, no acute distress. HEENT: Atraumatic, sclera anicteric. Chest: Clear to auscultation bilaterally.  No wheezes, rhonchi, or rales. Cardiovascular: Regular rate and rhythm, no murmurs. Abdomen: soft, nontender.  Normoactive bowel sounds.  No masses or hepatosplenomegaly appreciated.  Well-healed robotic incisions.  Soft and reducible 3-4 umbilical hernia. Extremities: Grossly normal range of motion.  Warm, well perfused.  No edema bilaterally. Skin: No rashes or lesions noted. Lymphatics: No cervical, supraclavicular, or inguinal adenopathy. GU: Normal appearing external genitalia without erythema, excoriation, or lesions.  Speculum exam reveals moderately atrophic vaginal mucosa, no vaginal lesions, cuff intact.  Bimanual exam reveals cuff intact without nodularity or tenderness.  Rectovaginal exam confirms these findings.  Laboratory & Radiologic Studies: None new  Assessment & Plan: Debra Miranda is a 73 y.o. woman with stage Ia grade 2 endometrial adenocarcinoma.  The patient is NED on exam today.  Per SGO surveillance recommendations, we will continue surveillance visits every 3 months until 1 year out from completion of treatment.  Although the patient had low risk disease, we are seeing her more frequently given high-intermediate risk factors that prompted adjuvant treatment.  After her radiation oncology appointment in March, I think we can transition to 92-month surveillance visits.  We will ask her to call back in April or May to get an appointment scheduled in  September.  Reviewed signs and symptoms of recurrence that should prompt a phone call sooner than her next appointment.  Encouraged continued follow-up with her primary care provider, especially in the setting of her worsened diabetes (last hemoglobin A1c was more than 14) she was last seen in mid November.  Jeral Pinch, MD  Division of Gynecologic Oncology  Department of Obstetrics and Gynecology  [facility]

## 2019-01-31 ENCOUNTER — Inpatient Hospital Stay: Payer: Medicare PPO | Attending: Gynecologic Oncology | Admitting: Gynecologic Oncology

## 2019-01-31 ENCOUNTER — Other Ambulatory Visit: Payer: Self-pay

## 2019-01-31 ENCOUNTER — Encounter: Payer: Self-pay | Admitting: Gynecologic Oncology

## 2019-01-31 VITALS — BP 109/83 | HR 78 | Temp 97.8°F | Resp 18 | Ht 67.0 in | Wt 187.0 lb

## 2019-01-31 DIAGNOSIS — C541 Malignant neoplasm of endometrium: Secondary | ICD-10-CM

## 2019-01-31 DIAGNOSIS — Z923 Personal history of irradiation: Secondary | ICD-10-CM | POA: Diagnosis not present

## 2019-01-31 DIAGNOSIS — Z90722 Acquired absence of ovaries, bilateral: Secondary | ICD-10-CM | POA: Insufficient documentation

## 2019-01-31 DIAGNOSIS — Z7984 Long term (current) use of oral hypoglycemic drugs: Secondary | ICD-10-CM | POA: Diagnosis not present

## 2019-01-31 DIAGNOSIS — Z79899 Other long term (current) drug therapy: Secondary | ICD-10-CM | POA: Insufficient documentation

## 2019-01-31 DIAGNOSIS — E1136 Type 2 diabetes mellitus with diabetic cataract: Secondary | ICD-10-CM | POA: Diagnosis not present

## 2019-01-31 NOTE — Patient Instructions (Signed)
It was a pleasure meeting you today!  Everything on your exam was very reassuring.  We will plan to transition to every 28-month visits after your next visit in March.  Please call our clinic in April or May to get that visit scheduled for September 2021.  If you develop any vaginal bleeding, discharge, pelvic pain, or other concerning symptoms before your visit, please call our clinic to schedule an appointment sooner at [phone].

## 2019-02-02 ENCOUNTER — Other Ambulatory Visit: Payer: Self-pay | Admitting: Family Medicine

## 2019-02-02 DIAGNOSIS — E1165 Type 2 diabetes mellitus with hyperglycemia: Secondary | ICD-10-CM

## 2019-02-28 ENCOUNTER — Other Ambulatory Visit: Payer: Self-pay | Admitting: Family Medicine

## 2019-02-28 MED ORDER — METFORMIN HCL ER 500 MG PO TB24: 500.0000 mg | ORAL_TABLET | Freq: Every day | ORAL | 5 refills | Status: DC

## 2019-02-28 NOTE — Telephone Encounter (Signed)
Metformin was discontinued at her last visit because the extended release had been recalled.  I have sent the extended release back into the pharmacy in hopes that this is now available.  Also she was started on Jardiance 10 mg daily.  Is she taking the Jardiance?

## 2019-02-28 NOTE — Telephone Encounter (Signed)
What is the name of the medication? Metformin hcl 500 mg  Have you contacted your pharmacy to request a refill? yes  Which pharmacy would you [facility] this [facility] to? [facility]   Patient notified that their request is being sent to the clinical staff for review and that they should receive a call once it is complete. If they do not receive a call within 24 hours they can check with their pharmacy or our office.

## 2019-02-28 NOTE — Telephone Encounter (Signed)
Please advise not on med list 

## 2019-03-01 ENCOUNTER — Other Ambulatory Visit: Payer: Self-pay | Admitting: Family Medicine

## 2019-03-01 MED ORDER — METFORMIN HCL 500 MG PO TABS: 500.0000 mg | ORAL_TABLET | Freq: Two times a day (BID) | ORAL | 3 refills | Status: DC

## 2019-03-01 NOTE — Telephone Encounter (Signed)
Patient states that she took Jardiance up until she ran out yesterday. She cannot afford any more and doesn't want to take it. She is requesting that she be put back on regular Metformin twice daily instead of the extended release. Please advise

## 2019-03-01 NOTE — Telephone Encounter (Addendum)
Patient aware and states that she is willing to retry the metformin. Patient aware to call us if the diarrhea starts back again.

## 2019-03-01 NOTE — Telephone Encounter (Signed)
I be glad to send in regular Metformin for this patient.  However, she noted that she stopped the regular Metformin previously due to severe diarrhea.

## 2019-03-31 ENCOUNTER — Other Ambulatory Visit: Payer: Self-pay

## 2019-03-31 ENCOUNTER — Encounter: Payer: Self-pay | Admitting: Family Medicine

## 2019-03-31 ENCOUNTER — Ambulatory Visit (INDEPENDENT_AMBULATORY_CARE_PROVIDER_SITE_OTHER): Payer: Medicare PPO | Admitting: Family Medicine

## 2019-03-31 VITALS — BP 131/75 | HR 81 | Temp 97.7°F | Ht 67.0 in | Wt 187.0 lb

## 2019-03-31 DIAGNOSIS — E1169 Type 2 diabetes mellitus with other specified complication: Secondary | ICD-10-CM | POA: Diagnosis not present

## 2019-03-31 DIAGNOSIS — M25511 Pain in right shoulder: Secondary | ICD-10-CM

## 2019-03-31 DIAGNOSIS — M25512 Pain in left shoulder: Secondary | ICD-10-CM | POA: Diagnosis not present

## 2019-03-31 DIAGNOSIS — E1165 Type 2 diabetes mellitus with hyperglycemia: Secondary | ICD-10-CM

## 2019-03-31 DIAGNOSIS — M8949 Other hypertrophic osteoarthropathy, multiple sites: Secondary | ICD-10-CM | POA: Diagnosis not present

## 2019-03-31 DIAGNOSIS — G8929 Other chronic pain: Secondary | ICD-10-CM | POA: Diagnosis not present

## 2019-03-31 DIAGNOSIS — E785 Hyperlipidemia, unspecified: Secondary | ICD-10-CM

## 2019-03-31 DIAGNOSIS — M159 Polyosteoarthritis, unspecified: Secondary | ICD-10-CM

## 2019-03-31 LAB — BAYER DCA HB A1C WAIVED: HB A1C (BAYER DCA - WAIVED): 11 % — ABNORMAL HIGH (ref <7.0)

## 2019-03-31 MED ORDER — JARDIANCE 25 MG PO TABS: 25.0000 mg | ORAL_TABLET | Freq: Every day | ORAL | 2 refills | Status: DC

## 2019-03-31 MED ORDER — DICLOFENAC SODIUM 1 % EX GEL: 2.0000 g | Freq: Four times a day (QID) | CUTANEOUS | 3 refills | Status: DC

## 2019-03-31 NOTE — Progress Notes (Signed)
Subjective: CC: DM2 PCP: [name], DO [id number] Debra Miranda is a 74 y.o. female presenting to clinic today for:  1. Type 2 Diabetes w/ HLD Patient reports compliance w Januvia 100mg , Metformin IR 500 mg daily (cannot tolerate twice daily dosing nor the XR version), Lipitor 20mg .  Jardiance was added last visit.  She thinks she is taking the 10 mg of this.  Denies any vaginal concerns or dysuria.  She is had some pelvic issues in the past related to cancer but this has been unchanged with the addition of Jardiance.  Last eye exam: Sees Dr. Deon Pilling.  Has appointment later this week Last foot exam: UTD Last A1c:  Lab Results  Component Value Date   HGBA1C >14.0 (H) 12/27/2018   Nephropathy screen indicated?: UTD Last flu, zoster and/or pneumovax: UTD Immunization History  Administered Date(s) Administered  . Fluad Quad(high Dose 65+) 11/15/2018  . Influenza, High Dose Seasonal PF 11/29/2015, 11/18/2016, 12/06/2017  . Influenza,inj,Quad PF,6+ Mos 12/07/2013, 11/12/2014  . Pneumococcal Conjugate-13 03/17/2013  . Pneumococcal Polysaccharide-23 07/29/2017, 02/09/2018   ROS: Denies any visual disturbance, excessive thirst, unplanned weight loss, nausea, vomiting or abdominal pain.  2.  Osteoarthritis Patient reports bilateral shoulder pain and right hand pain, particularly with certain movements.  Does not report any swelling, discoloration.  She is not currently taking anything for it but has used Aspercreme intermittently in the past.    Allergies  Allergen Reactions  . Latex Itching   Past Medical History:  Diagnosis Date  . Arthritis    right hand  . Cataract    RIGHT EYE; now removed  . Depression    many years ago, no meds  . Diabetes mellitus without complication ([place])    type 2  . Endometrial adenocarcinoma ([place]) 01/05/2018   treated with HDR  . Environmental allergies   . Family history of breast cancer   . Family history of kidney cancer   . Family  history of pancreatic cancer   . Family history of stomach cancer   . Family history of uterine cancer   . Hemorrhoids   . Nodular basal cell carcinoma (BCC) 08/2017   LEFT LOWER LEG above foot  . Orthostatic hypotension    Patient is unaware of this diagnosis    Current Outpatient Medications:  .  atorvastatin (LIPITOR) 20 MG tablet, Take 1 tablet (20 mg total) by mouth daily., Disp: 90 tablet, Rfl: 3 .  Bacillus Coagulans-Inulin (PROBIOTIC) 1-250 BILLION-MG CAPS, Take by mouth., Disp: , Rfl:  .  Calcium Carbonate-Vit D-Min (CALCIUM 1200 PO), Take 1 tablet by mouth at bedtime. , Disp: , Rfl:  .  cetirizine (ZYRTEC) 10 MG tablet, Take 10 mg by mouth daily., Disp: , Rfl:  .  Cholecalciferol (VITAMIN D) 2000 UNITS CAPS, Take 2,000 Units by mouth at bedtime. , Disp: , Rfl:  .  escitalopram (LEXAPRO) 10 MG tablet, Take 1 tablet (10 mg total) by mouth daily., Disp: 90 tablet, Rfl: 3 .  Ferrous Sulfate (IRON) 90 (18 Fe) MG TABS, Take by mouth., Disp: , Rfl:  .  JARDIANCE 10 MG TABS tablet, TAKE 10 MG BY MOUTH DAILY BEFORE BREAKFAST FOR 1 MONTH THEN PICK UP THE 25 MG TABLETS., Disp: 30 tablet, Rfl: 1 .  metFORMIN (GLUCOPHAGE) 500 MG tablet, Take 1 tablet (500 mg total) by mouth 2 (two) times daily with a meal. (patient prefers IR over XR. Please cancel previous rx), Disp: 180 tablet, Rfl: 3 .  Multiple Vitamins-Minerals (ONE-A-DAY VITACRAVES)  CHEW, Chew by mouth., Disp: , Rfl:  .  nystatin (MYCOSTATIN/NYSTOP) powder, Apply topically 2 (two) times daily., Disp: 60 g, Rfl: 2 .  Pumpkin Seed-Soy Germ (AZO BLADDER CONTROL/GO-LESS PO), Take by mouth., Disp: , Rfl:  .  sitaGLIPtin (JANUVIA) 100 MG tablet, Take 100 mg by mouth at bedtime. , Disp: , Rfl:  Social History   Socioeconomic History  . Marital status: Soil scientist    Spouse name: [name]  . Number of children: 1  . Years of education: 29  . Highest education level: Associate degree: occupational, Hotel manager, or vocational program    Occupational History  . Occupation: Retired    Comment: [employer]  Tobacco Use  . Smoking status: Current Every Day Smoker    Years: 45.00    Types: Cigars  . Smokeless tobacco: Never Used  . Tobacco comment: 8 small cigars a day  Substance and Sexual Activity  . Alcohol use: Not Currently  . Drug use: No  . Sexual activity: Not Currently  Other Topics Concern  . Not on file  Social History Narrative  . Not on file   Social Determinants of Health   Financial Resource Strain:   . Difficulty of Paying Living Expenses: Not on file  Food Insecurity:   . Worried About Charity fundraiser in the Last Year: Not on file  . Ran Out of Food in the Last Year: Not on file  Transportation Needs:   . Lack of Transportation (Medical): Not on file  . Lack of Transportation (Non-Medical): Not on file  Physical Activity: Inactive  . Days of Exercise per Week: 0 days  . Minutes of Exercise per Session: 0 min  Stress:   . Feeling of Stress : Not on file  Social Connections:   . Frequency of Communication with Friends and Family: Not on file  . Frequency of Social Gatherings with Friends and Family: Not on file  . Attends Religious Services: Not on file  . Active Member of Clubs or Organizations: Not on file  . Attends Archivist Meetings: Not on file  . Marital Status: Not on file  Intimate Partner Violence: Unknown  . Fear of Current or Ex-Partner: Not on file  . Emotionally Abused: Not on file  . Physically Abused: Not on file  . Sexually Abused: No   Family History  Problem Relation Age of Onset  . Cervical cancer Mother 23       treated with radiation  . Pancreatic cancer Mother 103  . Stroke Father   . Pulmonary fibrosis Brother   . Alzheimer's disease Brother   . Stomach cancer Brother        dx 62s  . Diabetes Brother   . Lung cancer Maternal Grandmother   . Stroke Paternal Grandfather   . Uterine cancer Maternal Aunt        dx 78s  . Kidney cancer  Maternal Uncle        dx 22s  . Colon cancer Maternal Uncle        dx 77s  . Breast cancer Cousin        maternal cousin dx 92    Objective: Office vital signs reviewed. BP 131/75   Pulse 81   Temp 97.7 F (36.5 Debra) (Temporal)   Ht 5\' 7"  (1.702 m)   Wt 187 lb (84.8 kg)   SpO2 96%   BMI 29.29 kg/m   Physical Examination:  General: Awake, alert, well appearing, No acute  distress HEENT: sclera white, MMM Cardio: regular rate and rhythm, S1S2 heard, no murmurs appreciated Pulm: clear to auscultation bilaterally, no wheezes, rhonchi or rales; normal work of breathing on room air Extremities: warm, No edema, cyanosis or clubbing; +1 pedal pulses bilaterally MSK: Pain with abduction of shoulders bilaterally.  She has mild arthritic changes without deviation of the hands.  Assessment/ Plan: 74 y.o. female   1. Uncontrolled type 2 diabetes mellitus with hyperglycemia (HCC) Continues to be uncontrolled but is improving.  A1c has come down from 14-11 today.  I have increased her dose of Jardiance.  We will follow-up in 3 months, sooner if needed. - Bayer DCA Hb A1c Waived - empagliflozin (JARDIANCE) 25 MG TABS tablet; Take 25 mg by mouth daily before breakfast.  Dispense: 30 tablet; Refill: 2  2. Hyperlipidemia associated with type 2 diabetes mellitus ([place]) Continue statin  3. Chronic pain of both shoulders Voltaren gel prescribed.  Use Tylenol 3 times daily. - diclofenac Sodium (VOLTAREN) 1 % GEL; Apply 2-4 g topically 4 (four) times daily. If not covered by ins, please help her find the OTC version  Dispense: 200 g; Refill: 3  4. Primary osteoarthritis involving multiple joints - diclofenac Sodium (VOLTAREN) 1 % GEL; Apply 2-4 g topically 4 (four) times daily. If not covered by ins, please help her find the OTC version  Dispense: 200 g; Refill: 3   No orders of the defined types were placed in this encounter.  No orders of the defined types were placed in this  encounter.   [name], DO [place] [phone]

## 2019-03-31 NOTE — Patient Instructions (Signed)
Use Tylenol arthritis every 8 hours for your shoulder and hand pain.  I have sent in Voltaren gel for you to apply to the joints that hurt up to 4 times daily if needed.  If this is not covered by her insurance, you can get the over-the-counter version.  I have increased your Jardiance to 25 mg daily because your blood sugar is still too high.  Your goal A1c is less than 8.5.

## 2019-04-03 ENCOUNTER — Other Ambulatory Visit (INDEPENDENT_AMBULATORY_CARE_PROVIDER_SITE_OTHER): Payer: Medicare PPO

## 2019-04-03 ENCOUNTER — Other Ambulatory Visit: Payer: Self-pay

## 2019-04-03 DIAGNOSIS — Z780 Asymptomatic menopausal state: Secondary | ICD-10-CM | POA: Diagnosis not present

## 2019-04-03 DIAGNOSIS — M85852 Other specified disorders of bone density and structure, left thigh: Secondary | ICD-10-CM

## 2019-04-03 DIAGNOSIS — N951 Menopausal and female climacteric states: Secondary | ICD-10-CM | POA: Diagnosis not present

## 2019-04-07 ENCOUNTER — Encounter: Payer: Self-pay | Admitting: *Deleted

## 2019-04-16 ENCOUNTER — Ambulatory Visit: Payer: Medicare PPO | Attending: Internal Medicine

## 2019-04-16 DIAGNOSIS — Z23 Encounter for immunization: Secondary | ICD-10-CM | POA: Insufficient documentation

## 2019-04-16 NOTE — Progress Notes (Signed)
   Covid-19 Vaccination Clinic  Name:  Debra Miranda    MRN: [id number] DOB: Oct 27, 1945  04/16/2019  [name] was observed post Covid-19 immunization for 15 minutes without incidence. She was provided with Vaccine Information Sheet and instruction to access the V-Safe system.   [name] was instructed to call 911 with any severe reactions post vaccine: Marland Kitchen Difficulty breathing  . Swelling of your face and throat  . A fast heartbeat  . A bad rash all over your body  . Dizziness and weakness    Immunizations Administered    Name Date Dose VIS Date Route   Pfizer COVID-19 Vaccine 04/16/2019  1:38 PM 0.3 mL 01/27/2019 Intramuscular   Manufacturer: [place]   Lot: [id number]   [place]: [id number]

## 2019-04-17 ENCOUNTER — Other Ambulatory Visit: Payer: Self-pay | Admitting: Family Medicine

## 2019-04-17 ENCOUNTER — Telehealth: Payer: Self-pay | Admitting: Family Medicine

## 2019-04-17 DIAGNOSIS — C541 Malignant neoplasm of endometrium: Secondary | ICD-10-CM

## 2019-04-17 DIAGNOSIS — N3946 Mixed incontinence: Secondary | ICD-10-CM

## 2019-04-17 NOTE — Telephone Encounter (Signed)
Patient states that she has been having frequent urination & having accidents for months.  Patient states that she told Dr Darnell Level about it months ago when it started but now it is worse.  Would like to know if there is a medication she can try or if she can be sent to a urologist? Please advise and send to pools.

## 2019-04-17 NOTE — Telephone Encounter (Signed)
Sure.  I can place a referral to urology for examination. Symptoms may be related to history of pelvic cancer.  There are certainly medications out there but urologic evaluation would likely be better for this patient given her history.  I will place referral if she will let me know if she prefers [place] or [place]

## 2019-04-17 NOTE — Telephone Encounter (Signed)
Patient aware and verbalizes understanding.  States that she would rather go to [facility].  Aware referral will be placed.

## 2019-05-08 ENCOUNTER — Encounter: Payer: Self-pay | Admitting: Radiation Oncology

## 2019-05-08 ENCOUNTER — Other Ambulatory Visit: Payer: Self-pay

## 2019-05-08 ENCOUNTER — Ambulatory Visit
Admission: RE | Admit: 2019-05-08 | Discharge: 2019-05-08 | Disposition: A | Discharge status: Home / Self Care | Payer: Medicare PPO | Source: Ambulatory Visit | Attending: Radiation Oncology | Admitting: Radiation Oncology

## 2019-05-08 VITALS — BP 144/63 | HR 84 | Temp 98.2°F | Resp 20 | Wt 186.2 lb

## 2019-05-08 DIAGNOSIS — Z08 Encounter for follow-up examination after completed treatment for malignant neoplasm: Secondary | ICD-10-CM | POA: Diagnosis not present

## 2019-05-08 DIAGNOSIS — Z8542 Personal history of malignant neoplasm of other parts of uterus: Secondary | ICD-10-CM | POA: Diagnosis not present

## 2019-05-08 DIAGNOSIS — C541 Malignant neoplasm of endometrium: Secondary | ICD-10-CM

## 2019-05-08 DIAGNOSIS — Z79899 Other long term (current) drug therapy: Secondary | ICD-10-CM | POA: Diagnosis not present

## 2019-05-08 DIAGNOSIS — Z7984 Long term (current) use of oral hypoglycemic drugs: Secondary | ICD-10-CM | POA: Insufficient documentation

## 2019-05-08 DIAGNOSIS — Z923 Personal history of irradiation: Secondary | ICD-10-CM | POA: Diagnosis not present

## 2019-05-08 NOTE — Patient Instructions (Signed)
Coronavirus (COVID-19) Are you at risk?  Are you at risk for the Coronavirus (COVID-19)?  To be considered HIGH RISK for Coronavirus (COVID-19), you have to meet the following criteria:  . Traveled to [place], [place], [place], [place] or [place]; or in the [place] to [place], [place], [place], or [place]; and have fever, cough, and shortness of breath within the last 2 weeks of travel OR . Been in close contact with a person diagnosed with COVID-19 within the last 2 weeks and have fever, cough, and shortness of breath . IF YOU DO NOT MEET THESE CRITERIA, YOU ARE CONSIDERED LOW RISK FOR COVID-19.  What to do if you are HIGH RISK for COVID-19?  . If you are having a medical emergency, call 911. . Seek medical care right away. Before you go to a doctor's office, urgent care or emergency department, call ahead and tell them about your recent travel, contact with someone diagnosed with COVID-19, and your symptoms. You should receive instructions from your physician's office regarding next steps of care.  . When you arrive at healthcare provider, tell the healthcare staff immediately you have returned from visiting [place], [place], [place], [place] or [place]; or traveled in the [place] to [place], [place], [place], or [place]; in the last two weeks or you have been in close contact with a person diagnosed with COVID-19 in the last 2 weeks.   . Tell the health care staff about your symptoms: fever, cough and shortness of breath. . After you have been seen by a medical provider, you will be either: o Tested for (COVID-19) and discharged home on quarantine except to seek medical care if symptoms worsen, and asked to  - Stay home and avoid contact with others until you get your results (4-5 days)  - Avoid travel on public transportation if possible (such as bus, train, or airplane) or o Sent to the Emergency Department by EMS for evaluation, COVID-19 testing, and possible  admission depending on your condition and test results.  What to do if you are LOW RISK for COVID-19?  Reduce your risk of any infection by using the same precautions used for avoiding the common cold or flu:  . Wash your hands often with soap and warm water for at least 20 seconds.  If soap and water are not readily available, use an alcohol-based hand sanitizer with at least 60% alcohol.  . If coughing or sneezing, cover your mouth and nose by coughing or sneezing into the elbow areas of your shirt or coat, into a tissue or into your sleeve (not your hands). . Avoid shaking hands with others and consider head nods or verbal greetings only. . Avoid touching your eyes, nose, or mouth with unwashed hands.  . Avoid close contact with people who are sick. . Avoid places or events with large numbers of people in one location, like concerts or sporting events. . Carefully consider travel plans you have or are making. . If you are planning any travel outside or inside the [place], visit the CDC's Travelers' Health webpage for the latest health notices. . If you have some symptoms but not all symptoms, continue to monitor at home and seek medical attention if your symptoms worsen. . If you are having a medical emergency, call 911.   ADDITIONAL HEALTHCARE OPTIONS FOR PATIENTS  [facility] / e-Visit: [email or web address]         [facility]: [phone]  [facility]: [phone]                   [facility]: [phone]   

## 2019-05-08 NOTE — Progress Notes (Signed)
Radiation Oncology         [phone] ________________________________  Name: Debra Miranda MRN: [id number]  Date: 05/08/2019  DOB: April 26, 1945  Follow-Up Visit Note  CC: [name], DO  [name], DO    ICD-10-CM   1. Endometrial adenocarcinoma (HCC)  C54.1     Diagnosis: FIGO grade 2, Stage IA, Endometrial adenocarcinoma (HCC)  Interval Since Last Radiation: One year and four days.  04/05/2018, 04/12/2018, 04/19/2018, 04/26/2018, 05/04/2018: Vagina, Iridium HDR, 3 cm cylinder, tx length of 3 cm / 30 Gy in 5 fractions  Narrative: The patient returns today for routine follow-up. She followed up with Dr. Berline Lopes on 01/31/2019. During that time, she was doing well and was using her vaginal dilators as instructed.  On review of systems, she reports not using her vaginal dilator since her last exam.  The discussed the importance of resuming. . She denies pelvic pain abdominal bloating,  vaginal bleeding.  She denies any hematuria or rectal bleeding other than that associated with hemorrhoids.  Allergies:  is allergic to latex.  Meds: Current Outpatient Medications  Medication Sig Dispense Refill  . atorvastatin (LIPITOR) 20 MG tablet Take 1 tablet (20 mg total) by mouth daily. 90 tablet 3  . Bacillus Coagulans-Inulin (PROBIOTIC) 1-250 BILLION-MG CAPS Take by mouth.    . Calcium Carbonate-Vit D-Min (CALCIUM 1200 PO) Take 1 tablet by mouth at bedtime.     . cetirizine (ZYRTEC) 10 MG tablet Take 10 mg by mouth daily.    . Cholecalciferol (VITAMIN D) 2000 UNITS CAPS Take 2,000 Units by mouth at bedtime.     . diclofenac Sodium (VOLTAREN) 1 % GEL Apply 2-4 g topically 4 (four) times daily. If not covered by ins, please help her find the OTC version 200 g 3  . empagliflozin (JARDIANCE) 25 MG TABS tablet Take 25 mg by mouth daily before breakfast. 30 tablet 2  . escitalopram (LEXAPRO) 10 MG tablet Take 1 tablet (10 mg total) by mouth daily. 90 tablet 3  . Ferrous  Sulfate (IRON) 90 (18 Fe) MG TABS Take by mouth.    . metFORMIN (GLUCOPHAGE) 500 MG tablet Take 1 tablet (500 mg total) by mouth 2 (two) times daily with a meal. (patient prefers IR over XR. Please cancel previous rx) (Patient taking differently: Take 500 mg by mouth daily with breakfast. (patient prefers IR over XR. Please cancel previous rx)) 180 tablet 3  . Multiple Vitamins-Minerals (ONE-A-DAY VITACRAVES) CHEW Chew by mouth.    . nystatin (MYCOSTATIN/NYSTOP) powder Apply topically 2 (two) times daily. 60 g 2  . Pumpkin Seed-Soy Germ (AZO BLADDER CONTROL/GO-LESS PO) Take by mouth.    . sitaGLIPtin (JANUVIA) 100 MG tablet Take 100 mg by mouth at bedtime.      No current facility-administered medications for this encounter.    Physical Findings: The patient is in no acute distress. Patient is alert and oriented.  weight is 186 lb 3.2 oz (84.5 kg). Her temperature is 98.2 F (36.8 C). Her blood pressure is 144/63 (abnormal) and her pulse is 84. Her respiration is 20 and oxygen saturation is 97%. .  No significant changes. Lungs are clear to auscultation bilaterally. Heart has regular rate and rhythm. No palpable cervical, supraclavicular, or axillary adenopathy. Abdomen soft, non-tender, normal bowel sounds.  On pelvic examination the external genitalia were unremarkable. A speculum exam was performed. There are no mucosal lesions noted in the vaginal vault. On bimanual  examination there were no pelvic masses appreciated.  Vaginal cuff intact.    Lab Findings: Lab Results  Component Value Date   WBC 6.9 12/27/2018   HGB 15.5 12/27/2018   HCT 46.4 12/27/2018   MCV 88 12/27/2018   PLT 129 (L) 12/27/2018    Radiographic Findings: No results found.  Impression:  FIGO grade 2, Stage IA, Endometrial adenocarcinoma   No evidence of recurrence on clinical exam today.   Plan: Routine follow-up with radiation oncology in six months.  Follow-up with gynecologic oncology in 3 months, Dr.  Berline Lopes.   ____________________________________   Blair Promise, PhD, MD  This document serves as a record of services personally performed by Gery Pray, MD. It was created on his behalf by Clerance Lav, a trained medical scribe. The creation of this record is based on the scribe's personal observations and the provider's statements to them. This document has been checked and approved by the attending provider.

## 2019-05-08 NOTE — Progress Notes (Signed)
Debra Miranda presents today for f/u with Dr. Sondra Come. Pt denies c/o pain. Pt reports that itching resolved with "diaper rash cream and baby powder and then it went away". Pt denies dysuria/hematuria. Pt denies vaginal bleeding. Pt reports that at times, it feels like "syrup". Pt reports urinary frequency and has an upcoming appt with urologist. Pt reports bleeding from hemmoroids occasionally and reports bowels alternate between diarrhea and constipation. Pt reports occasional abdominal bloating. Pt denies N/V.  BP (!) 144/63 (BP Location: Left Arm, Patient Position: Sitting, Cuff Size: Large)   Pulse 84   Temp 98.2 F (36.8 C)   Resp 20   Wt 186 lb 3.2 oz (84.5 kg)   SpO2 97%   BMI 29.16 kg/m   Wt Readings from Last 3 Encounters:  05/08/19 186 lb 3.2 oz (84.5 kg)  03/31/19 187 lb (84.8 kg)  01/31/19 187 lb (84.8 kg)   Loma Sousa, RN BSN

## 2019-05-16 ENCOUNTER — Ambulatory Visit: Payer: Medicare PPO | Attending: Internal Medicine

## 2019-05-16 DIAGNOSIS — Z23 Encounter for immunization: Secondary | ICD-10-CM

## 2019-05-16 NOTE — Progress Notes (Signed)
   Covid-19 Vaccination Clinic  Name:  Debra Miranda    MRN: [id number] DOB: Feb 13, 1946  05/16/2019  [name] was observed post Covid-19 immunization for 15 minutes without incident. She was provided with Vaccine Information Sheet and instruction to access the V-Safe system.   [name] was instructed to call 911 with any severe reactions post vaccine: Marland Kitchen Difficulty breathing  . Swelling of face and throat  . A fast heartbeat  . A bad rash all over body  . Dizziness and weakness   Immunizations Administered    Name Date Dose VIS Date Route   Pfizer COVID-19 Vaccine 05/16/2019  3:57 PM 0.3 mL 01/27/2019 Intramuscular   Manufacturer: [place]   Lot: [id number]   [place]: [id number]

## 2019-05-30 DIAGNOSIS — R350 Frequency of micturition: Secondary | ICD-10-CM | POA: Diagnosis not present

## 2019-05-30 DIAGNOSIS — N3946 Mixed incontinence: Secondary | ICD-10-CM | POA: Diagnosis not present

## 2019-05-30 DIAGNOSIS — R8279 Other abnormal findings on microbiological examination of urine: Secondary | ICD-10-CM | POA: Diagnosis not present

## 2019-06-01 ENCOUNTER — Emergency Department (HOSPITAL_COMMUNITY): Payer: Medicare PPO

## 2019-06-01 ENCOUNTER — Emergency Department (HOSPITAL_COMMUNITY): Payer: Medicare PPO | Admitting: Anesthesiology

## 2019-06-01 ENCOUNTER — Encounter (HOSPITAL_COMMUNITY): Payer: Self-pay

## 2019-06-01 ENCOUNTER — Encounter (HOSPITAL_COMMUNITY)
Admission: EM | Disposition: A | Discharge status: Home / Self Care | Payer: Self-pay | Source: Home / Self Care | Attending: General Surgery

## 2019-06-01 ENCOUNTER — Other Ambulatory Visit: Payer: Self-pay

## 2019-06-01 ENCOUNTER — Inpatient Hospital Stay (HOSPITAL_COMMUNITY)
Admission: EM | Admit: 2019-06-01 | Discharge: 2019-06-06 | DRG: 329 | Disposition: A | Discharge status: Home / Self Care | Payer: Medicare PPO | Attending: General Surgery | Admitting: General Surgery

## 2019-06-01 DIAGNOSIS — Z8049 Family history of malignant neoplasm of other genital organs: Secondary | ICD-10-CM | POA: Diagnosis not present

## 2019-06-01 DIAGNOSIS — E119 Type 2 diabetes mellitus without complications: Secondary | ICD-10-CM | POA: Diagnosis not present

## 2019-06-01 DIAGNOSIS — Z79899 Other long term (current) drug therapy: Secondary | ICD-10-CM

## 2019-06-01 DIAGNOSIS — Z03818 Encounter for observation for suspected exposure to other biological agents ruled out: Secondary | ICD-10-CM | POA: Diagnosis not present

## 2019-06-01 DIAGNOSIS — R1084 Generalized abdominal pain: Secondary | ICD-10-CM | POA: Diagnosis not present

## 2019-06-01 DIAGNOSIS — R9431 Abnormal electrocardiogram [ECG] [EKG]: Secondary | ICD-10-CM | POA: Diagnosis not present

## 2019-06-01 DIAGNOSIS — Z85828 Personal history of other malignant neoplasm of skin: Secondary | ICD-10-CM | POA: Diagnosis not present

## 2019-06-01 DIAGNOSIS — K469 Unspecified abdominal hernia without obstruction or gangrene: Secondary | ICD-10-CM | POA: Diagnosis not present

## 2019-06-01 DIAGNOSIS — Z7984 Long term (current) use of oral hypoglycemic drugs: Secondary | ICD-10-CM | POA: Diagnosis not present

## 2019-06-01 DIAGNOSIS — Z4682 Encounter for fitting and adjustment of non-vascular catheter: Secondary | ICD-10-CM | POA: Diagnosis not present

## 2019-06-01 DIAGNOSIS — Z90710 Acquired absence of both cervix and uterus: Secondary | ICD-10-CM | POA: Diagnosis not present

## 2019-06-01 DIAGNOSIS — Z803 Family history of malignant neoplasm of breast: Secondary | ICD-10-CM | POA: Diagnosis not present

## 2019-06-01 DIAGNOSIS — Z20822 Contact with and (suspected) exposure to covid-19: Secondary | ICD-10-CM | POA: Diagnosis not present

## 2019-06-01 DIAGNOSIS — F17290 Nicotine dependence, other tobacco product, uncomplicated: Secondary | ICD-10-CM | POA: Diagnosis present

## 2019-06-01 DIAGNOSIS — R0902 Hypoxemia: Secondary | ICD-10-CM | POA: Diagnosis not present

## 2019-06-01 DIAGNOSIS — Z923 Personal history of irradiation: Secondary | ICD-10-CM | POA: Diagnosis not present

## 2019-06-01 DIAGNOSIS — N390 Urinary tract infection, site not specified: Secondary | ICD-10-CM | POA: Diagnosis present

## 2019-06-01 DIAGNOSIS — K55011 Focal (segmental) acute (reversible) ischemia of small intestine: Secondary | ICD-10-CM | POA: Diagnosis not present

## 2019-06-01 DIAGNOSIS — B373 Candidiasis of vulva and vagina: Secondary | ICD-10-CM | POA: Diagnosis present

## 2019-06-01 DIAGNOSIS — R109 Unspecified abdominal pain: Secondary | ICD-10-CM | POA: Diagnosis not present

## 2019-06-01 DIAGNOSIS — K567 Ileus, unspecified: Secondary | ICD-10-CM | POA: Diagnosis not present

## 2019-06-01 DIAGNOSIS — Z800 Family history of malignant neoplasm of digestive organs: Secondary | ICD-10-CM

## 2019-06-01 DIAGNOSIS — I10 Essential (primary) hypertension: Secondary | ICD-10-CM | POA: Diagnosis not present

## 2019-06-01 DIAGNOSIS — Z820 Family history of epilepsy and other diseases of the nervous system: Secondary | ICD-10-CM | POA: Diagnosis not present

## 2019-06-01 DIAGNOSIS — K56609 Unspecified intestinal obstruction, unspecified as to partial versus complete obstruction: Secondary | ICD-10-CM | POA: Diagnosis not present

## 2019-06-01 DIAGNOSIS — Z8051 Family history of malignant neoplasm of kidney: Secondary | ICD-10-CM

## 2019-06-01 DIAGNOSIS — E1165 Type 2 diabetes mellitus with hyperglycemia: Secondary | ICD-10-CM | POA: Diagnosis not present

## 2019-06-01 DIAGNOSIS — E860 Dehydration: Secondary | ICD-10-CM | POA: Diagnosis not present

## 2019-06-01 DIAGNOSIS — K420 Umbilical hernia with obstruction, without gangrene: Secondary | ICD-10-CM | POA: Diagnosis not present

## 2019-06-01 DIAGNOSIS — Z823 Family history of stroke: Secondary | ICD-10-CM | POA: Diagnosis not present

## 2019-06-01 DIAGNOSIS — Z833 Family history of diabetes mellitus: Secondary | ICD-10-CM | POA: Diagnosis not present

## 2019-06-01 DIAGNOSIS — Z8542 Personal history of malignant neoplasm of other parts of uterus: Secondary | ICD-10-CM

## 2019-06-01 DIAGNOSIS — B9689 Other specified bacterial agents as the cause of diseases classified elsewhere: Secondary | ICD-10-CM | POA: Diagnosis present

## 2019-06-01 DIAGNOSIS — K55029 Acute infarction of small intestine, extent unspecified: Secondary | ICD-10-CM | POA: Diagnosis present

## 2019-06-01 DIAGNOSIS — K429 Umbilical hernia without obstruction or gangrene: Secondary | ICD-10-CM | POA: Diagnosis not present

## 2019-06-01 DIAGNOSIS — Z09 Encounter for follow-up examination after completed treatment for conditions other than malignant neoplasm: Secondary | ICD-10-CM

## 2019-06-01 DIAGNOSIS — Z801 Family history of malignant neoplasm of trachea, bronchus and lung: Secondary | ICD-10-CM | POA: Diagnosis not present

## 2019-06-01 DIAGNOSIS — Z91040 Latex allergy status: Secondary | ICD-10-CM

## 2019-06-01 DIAGNOSIS — Z01818 Encounter for other preprocedural examination: Secondary | ICD-10-CM

## 2019-06-01 DIAGNOSIS — R52 Pain, unspecified: Secondary | ICD-10-CM | POA: Diagnosis not present

## 2019-06-01 HISTORY — PX: UMBILICAL HERNIA REPAIR: SHX196

## 2019-06-01 HISTORY — PX: BOWEL RESECTION: SHX1257

## 2019-06-01 HISTORY — PX: OMENTECTOMY: SHX5985

## 2019-06-01 LAB — CBC WITH DIFFERENTIAL/PLATELET
Abs Immature Granulocytes: 0.04 10*3/uL (ref 0.00–0.07)
Basophils Absolute: 0 10*3/uL (ref 0.0–0.1)
Basophils Relative: 0 %
Eosinophils Absolute: 0 10*3/uL (ref 0.0–0.5)
Eosinophils Relative: 0 %
HCT: 46.8 % — ABNORMAL HIGH (ref 36.0–46.0)
Hemoglobin: 15.6 g/dL — ABNORMAL HIGH (ref 12.0–15.0)
Immature Granulocytes: 0 %
Lymphocytes Relative: 10 %
Lymphs Abs: 1.1 10*3/uL (ref 0.7–4.0)
MCH: 29.6 pg (ref 26.0–34.0)
MCHC: 33.3 g/dL (ref 30.0–36.0)
MCV: 88.8 fL (ref 80.0–100.0)
Monocytes Absolute: 0.5 10*3/uL (ref 0.1–1.0)
Monocytes Relative: 5 %
Neutro Abs: 9.7 10*3/uL — ABNORMAL HIGH (ref 1.7–7.7)
Neutrophils Relative %: 85 %
Platelets: 137 10*3/uL — ABNORMAL LOW (ref 150–400)
RBC: 5.27 MIL/uL — ABNORMAL HIGH (ref 3.87–5.11)
RDW: 13.2 % (ref 11.5–15.5)
WBC: 11.4 10*3/uL — ABNORMAL HIGH (ref 4.0–10.5)
nRBC: 0 % (ref 0.0–0.2)

## 2019-06-01 LAB — URINALYSIS, COMPLETE (UACMP) WITH MICROSCOPIC
Bilirubin Urine: NEGATIVE
Glucose, UA: >=500 mg/dL — AB
Ketones, ur: 20 mg/dL — AB
Nitrite: NEGATIVE
Protein, ur: 30 mg/dL — AB
Specific Gravity, Urine: 1.042 — ABNORMAL HIGH (ref 1.005–1.030)
pH: 5 (ref 5.0–8.0)

## 2019-06-01 LAB — COMPREHENSIVE METABOLIC PANEL
ALT: 18 U/L (ref 0–44)
AST: 15 U/L (ref 15–41)
Albumin: 4.1 g/dL (ref 3.5–5.0)
Alkaline Phosphatase: 108 U/L (ref 38–126)
Anion gap: 13 (ref 5–15)
BUN: 22 mg/dL (ref 8–23)
CO2: 23 mmol/L (ref 22–32)
Calcium: 9.3 mg/dL (ref 8.9–10.3)
Chloride: 98 mmol/L (ref 98–111)
Creatinine, Ser: 0.55 mg/dL (ref 0.44–1.00)
GFR calc Af Amer: >60 mL/min (ref >60)
GFR calc non Af Amer: >60 mL/min (ref >60)
Glucose, Bld: 333 mg/dL — ABNORMAL HIGH (ref 70–99)
Potassium: 3.8 mmol/L (ref 3.5–5.1)
Sodium: 134 mmol/L — ABNORMAL LOW (ref 135–145)
Total Bilirubin: 1.3 mg/dL — ABNORMAL HIGH (ref 0.3–1.2)
Total Protein: 7.2 g/dL (ref 6.5–8.1)

## 2019-06-01 LAB — RESPIRATORY PANEL BY RT PCR (FLU A&B, COVID)
Influenza A by PCR: NEGATIVE
Influenza B by PCR: NEGATIVE
SARS Coronavirus 2 by RT PCR: NEGATIVE

## 2019-06-01 LAB — LIPASE, BLOOD: Lipase: 18 U/L (ref 11–51)

## 2019-06-01 LAB — LACTIC ACID, PLASMA: Lactic Acid, Venous: 0.9 mmol/L (ref 0.5–1.9)

## 2019-06-01 LAB — GLUCOSE, CAPILLARY
Glucose-Capillary: 192 mg/dL — ABNORMAL HIGH (ref 70–99)
Glucose-Capillary: 200 mg/dL — ABNORMAL HIGH (ref 70–99)

## 2019-06-01 SURGERY — REPAIR, HERNIA, UMBILICAL, ADULT
Anesthesia: General | Site: Abdomen

## 2019-06-01 MED ORDER — CEFAZOLIN SODIUM-DEXTROSE 2-4 GM/100ML-% IV SOLN
2.0000 g | INTRAVENOUS | Status: AC
  Administered 2019-06-01: 2 g via INTRAVENOUS

## 2019-06-01 MED ORDER — CHLORHEXIDINE GLUCONATE CLOTH 2 % EX PADS
6.0000 | MEDICATED_PAD | Freq: Every day | CUTANEOUS | Status: DC
  Administered 2019-06-02 – 2019-06-06 (×5): 6 via TOPICAL

## 2019-06-01 MED ORDER — LACTATED RINGERS IV SOLN: INTRAVENOUS | Status: DC | PRN

## 2019-06-01 MED ORDER — ROCURONIUM BROMIDE 10 MG/ML (PF) SYRINGE
PREFILLED_SYRINGE | INTRAVENOUS | Status: AC
  Filled 2019-06-01: qty 10

## 2019-06-01 MED ORDER — ONDANSETRON HCL 4 MG/2ML IJ SOLN
INTRAMUSCULAR | Status: DC | PRN
  Administered 2019-06-01: 4 mg via INTRAVENOUS

## 2019-06-01 MED ORDER — HYDROMORPHONE HCL 1 MG/ML IJ SOLN
INTRAMUSCULAR | Status: AC
  Filled 2019-06-01: qty 0.5

## 2019-06-01 MED ORDER — MIDAZOLAM HCL 2 MG/2ML IJ SOLN
INTRAMUSCULAR | Status: AC
  Filled 2019-06-01: qty 2

## 2019-06-01 MED ORDER — FENTANYL CITRATE (PF) 250 MCG/5ML IJ SOLN
INTRAMUSCULAR | Status: AC
  Filled 2019-06-01: qty 10

## 2019-06-01 MED ORDER — LIDOCAINE 2% (20 MG/ML) 5 ML SYRINGE
INTRAMUSCULAR | Status: AC
  Filled 2019-06-01: qty 5

## 2019-06-01 MED ORDER — ARTIFICIAL TEARS OPHTHALMIC OINT
TOPICAL_OINTMENT | OPHTHALMIC | Status: DC | PRN
  Administered 2019-06-01: 1 via OPHTHALMIC

## 2019-06-01 MED ORDER — KETOROLAC TROMETHAMINE 15 MG/ML IJ SOLN
15.0000 mg | Freq: Four times a day (QID) | INTRAMUSCULAR | Status: DC | PRN
  Administered 2019-06-02 – 2019-06-04 (×5): 15 mg via INTRAVENOUS
  Filled 2019-06-01 (×5): qty 1

## 2019-06-01 MED ORDER — PROPOFOL 10 MG/ML IV BOLUS
INTRAVENOUS | Status: DC | PRN
  Administered 2019-06-01: 100 mg via INTRAVENOUS

## 2019-06-01 MED ORDER — FENTANYL CITRATE (PF) 250 MCG/5ML IJ SOLN
INTRAMUSCULAR | Status: DC | PRN
  Administered 2019-06-01 (×5): 50 ug via INTRAVENOUS

## 2019-06-01 MED ORDER — EPHEDRINE 5 MG/ML INJ
INTRAVENOUS | Status: AC
  Filled 2019-06-01: qty 10

## 2019-06-01 MED ORDER — PROPOFOL 10 MG/ML IV BOLUS
INTRAVENOUS | Status: AC
  Filled 2019-06-01: qty 20

## 2019-06-01 MED ORDER — LIDOCAINE HCL (CARDIAC) PF 100 MG/5ML IV SOSY
PREFILLED_SYRINGE | INTRAVENOUS | Status: DC | PRN
  Administered 2019-06-01: 100 mg via INTRATRACHEAL

## 2019-06-01 MED ORDER — SUCCINYLCHOLINE CHLORIDE 200 MG/10ML IV SOSY
PREFILLED_SYRINGE | INTRAVENOUS | Status: DC | PRN
  Administered 2019-06-01: 120 mg via INTRAVENOUS

## 2019-06-01 MED ORDER — KETOROLAC TROMETHAMINE 15 MG/ML IJ SOLN
15.0000 mg | Freq: Four times a day (QID) | INTRAMUSCULAR | Status: AC
  Administered 2019-06-01: 15 mg via INTRAVENOUS
  Filled 2019-06-01: qty 1

## 2019-06-01 MED ORDER — PHENYLEPHRINE 40 MCG/ML (10ML) SYRINGE FOR IV PUSH (FOR BLOOD PRESSURE SUPPORT)
PREFILLED_SYRINGE | INTRAVENOUS | Status: AC
  Filled 2019-06-01: qty 10

## 2019-06-01 MED ORDER — INSULIN ASPART 100 UNIT/ML ~~LOC~~ SOLN
10.0000 [IU] | Freq: Once | SUBCUTANEOUS | Status: AC
  Administered 2019-06-01: 10 [IU] via SUBCUTANEOUS
  Filled 2019-06-01: qty 1

## 2019-06-01 MED ORDER — BUPIVACAINE LIPOSOME 1.3 % IJ SUSP
INTRAMUSCULAR | Status: AC
  Filled 2019-06-01: qty 20

## 2019-06-01 MED ORDER — HYDROMORPHONE HCL 1 MG/ML IJ SOLN
1.0000 mg | Freq: Once | INTRAMUSCULAR | Status: AC
  Administered 2019-06-01: 1 mg via INTRAVENOUS
  Filled 2019-06-01: qty 1

## 2019-06-01 MED ORDER — ROCURONIUM BROMIDE 10 MG/ML (PF) SYRINGE
PREFILLED_SYRINGE | INTRAVENOUS | Status: DC | PRN
  Administered 2019-06-01: 50 mg via INTRAVENOUS

## 2019-06-01 MED ORDER — LACTATED RINGERS IV SOLN: INTRAVENOUS | Status: DC

## 2019-06-01 MED ORDER — EPHEDRINE SULFATE-NACL 50-0.9 MG/10ML-% IV SOSY
PREFILLED_SYRINGE | INTRAVENOUS | Status: DC | PRN
  Administered 2019-06-01: 10 mg via INTRAVENOUS

## 2019-06-01 MED ORDER — LACTATED RINGERS IV BOLUS
1000.0000 mL | Freq: Once | INTRAVENOUS | Status: AC
  Administered 2019-06-01: 1000 mL via INTRAVENOUS

## 2019-06-01 MED ORDER — IOHEXOL 300 MG/ML  SOLN
100.0000 mL | Freq: Once | INTRAMUSCULAR | Status: AC | PRN
  Administered 2019-06-01: 100 mL via INTRAVENOUS

## 2019-06-01 MED ORDER — BUPIVACAINE LIPOSOME 1.3 % IJ SUSP
INTRAMUSCULAR | Status: DC | PRN
  Administered 2019-06-01: 20 mL

## 2019-06-01 MED ORDER — SUCCINYLCHOLINE CHLORIDE 200 MG/10ML IV SOSY
PREFILLED_SYRINGE | INTRAVENOUS | Status: AC
  Filled 2019-06-01: qty 10

## 2019-06-01 MED ORDER — MIDAZOLAM HCL 5 MG/5ML IJ SOLN
INTRAMUSCULAR | Status: DC | PRN
  Administered 2019-06-01: 2 mg via INTRAVENOUS

## 2019-06-01 MED ORDER — ONDANSETRON HCL 4 MG/2ML IJ SOLN
4.0000 mg | Freq: Once | INTRAMUSCULAR | Status: AC
  Administered 2019-06-01: 4 mg via INTRAVENOUS
  Filled 2019-06-01: qty 2

## 2019-06-01 MED ORDER — ONDANSETRON HCL 4 MG/2ML IJ SOLN
INTRAMUSCULAR | Status: AC
  Filled 2019-06-01: qty 2

## 2019-06-01 MED ORDER — SODIUM CHLORIDE 0.9 % IR SOLN
Status: DC | PRN
  Administered 2019-06-01: 1000 mL

## 2019-06-01 MED ORDER — DEXTROSE IN LACTATED RINGERS 5 % IV SOLN: INTRAVENOUS | Status: DC

## 2019-06-01 MED ORDER — SUGAMMADEX SODIUM 200 MG/2ML IV SOLN
INTRAVENOUS | Status: DC | PRN
  Administered 2019-06-01: 200 mg via INTRAVENOUS

## 2019-06-01 MED ORDER — CEFAZOLIN SODIUM-DEXTROSE 2-4 GM/100ML-% IV SOLN
INTRAVENOUS | Status: AC
  Filled 2019-06-01: qty 100

## 2019-06-01 MED ORDER — CHLORHEXIDINE GLUCONATE CLOTH 2 % EX PADS: 6.0000 | MEDICATED_PAD | Freq: Once | CUTANEOUS | Status: DC

## 2019-06-01 SURGICAL SUPPLY — 43 items
ADH SKN CLS APL DERMABOND .7 (GAUZE/BANDAGES/DRESSINGS) ×2
APL PRP STRL LF DISP 70% ISPRP (MISCELLANEOUS) ×2
BLADE SURG 15 STRL LF DISP TIS (BLADE) ×2 IMPLANT
BLADE SURG 15 STRL SS (BLADE) ×4
CHLORAPREP W/TINT 26 (MISCELLANEOUS) ×4 IMPLANT
CLOTH BEACON ORANGE TIMEOUT ST (SAFETY) ×4 IMPLANT
COVER LIGHT HANDLE STERIS (MISCELLANEOUS) ×8 IMPLANT
COVER WAND RF STERILE (DRAPES) ×4 IMPLANT
DERMABOND ADVANCED (GAUZE/BANDAGES/DRESSINGS) ×2
DERMABOND ADVANCED .7 DNX12 (GAUZE/BANDAGES/DRESSINGS) ×2 IMPLANT
DRSG OPSITE POSTOP 4X8 (GAUZE/BANDAGES/DRESSINGS) ×2 IMPLANT
ELECT REM PT RETURN 9FT ADLT (ELECTROSURGICAL) ×4
ELECTRODE REM PT RTRN 9FT ADLT (ELECTROSURGICAL) ×2 IMPLANT
GLOVE BIOGEL PI IND STRL 6.5 (GLOVE) ×2 IMPLANT
GLOVE BIOGEL PI IND STRL 7.0 (GLOVE) ×2 IMPLANT
GLOVE BIOGEL PI INDICATOR 6.5 (GLOVE) ×4
GLOVE BIOGEL PI INDICATOR 7.0 (GLOVE) ×6
GLOVE SURG SS PI 6.5 STRL IVOR (GLOVE) ×4 IMPLANT
GLOVE SURG SS PI 7.0 STRL IVOR (GLOVE) ×2 IMPLANT
GOWN STRL REUS W/TWL LRG LVL3 (GOWN DISPOSABLE) ×14 IMPLANT
INST SET MINOR GENERAL (KITS) ×4 IMPLANT
KIT TURNOVER KIT A (KITS) ×4 IMPLANT
LIGASURE IMPACT 36 18CM CVD LR (INSTRUMENTS) ×2 IMPLANT
MANIFOLD NEPTUNE II (INSTRUMENTS) ×4 IMPLANT
NDL HYPO 18GX1.5 BLUNT FILL (NEEDLE) ×2 IMPLANT
NEEDLE HYPO 18GX1.5 BLUNT FILL (NEEDLE) ×4 IMPLANT
NEEDLE HYPO 22GX1.5 SAFETY (NEEDLE) ×4 IMPLANT
NS IRRIG 1000ML POUR BTL (IV SOLUTION) ×4 IMPLANT
PACK MINOR (CUSTOM PROCEDURE TRAY) ×4 IMPLANT
PAD ARMBOARD 7.5X6 YLW CONV (MISCELLANEOUS) ×4 IMPLANT
RELOAD PROXIMATE 75MM BLUE (ENDOMECHANICALS) ×12 IMPLANT
RELOAD STAPLE 75 3.8 BLU REG (ENDOMECHANICALS) IMPLANT
SET BASIN LINEN APH (SET/KITS/TRAYS/PACK) ×4 IMPLANT
SPONGE LAP 18X18 X RAY DECT (DISPOSABLE) ×4 IMPLANT
STAPLER GUN LINEAR PROX 60 (STAPLE) ×2 IMPLANT
STAPLER PROXIMATE 75MM BLUE (STAPLE) ×2 IMPLANT
SUT ETHIBOND NAB MO 7 #0 18IN (SUTURE) ×2 IMPLANT
SUT MNCRL AB 4-0 PS2 18 (SUTURE) ×4 IMPLANT
SUT PDS AB CT VIOLET #0 27IN (SUTURE) ×2 IMPLANT
SUT SILK 3 0 SH CR/8 (SUTURE) ×4 IMPLANT
SUT VIC AB 3-0 SH 27 (SUTURE) ×4
SUT VIC AB 3-0 SH 27X BRD (SUTURE) ×2 IMPLANT
SYR 20ML LL LF (SYRINGE) ×8 IMPLANT

## 2019-06-01 NOTE — ED Provider Notes (Signed)
Emergency Department Provider Note   I have reviewed the triage vital signs and the nursing notes.   HISTORY  Chief Complaint Abdominal Pain   HPI Debra Miranda is a 74 y.o. female with PMH of endometrial carcinoma s/p radiation and DM presents to the emergency department for evaluation of central abdominal discomfort with enlarging umbilical hernia.  Symptoms began 2 days ago and have progressively worsened.  She describes her hernia as always being present but soft.  She states that over the past 2 days it has become very firm and uncomfortable.  She continues to have bowel movements and pass flatus.  She has had some loose stools in addition to 1-2 episodes of vomiting.  With continued pain this morning she presents for evaluation. No known fever or chills. No CP or SOB.   Past Medical History:  Diagnosis Date  . Arthritis    right hand  . Cataract    RIGHT EYE; now removed  . Depression    many years ago, no meds  . Diabetes mellitus without complication ([place])    type 2  . Endometrial adenocarcinoma ([place]) 01/05/2018   treated with HDR  . Environmental allergies   . Family history of breast cancer   . Family history of kidney cancer   . Family history of pancreatic cancer   . Family history of stomach cancer   . Family history of uterine cancer   . Hemorrhoids   . Nodular basal cell carcinoma (BCC) 08/2017   LEFT LOWER LEG above foot  . Orthostatic hypotension    Patient is unaware of this diagnosis    Patient Active Problem List   Diagnosis Date Noted  . Genetic testing 04/21/2018  . Family history of uterine cancer   . Family history of pancreatic cancer   . Family history of stomach cancer   . Family history of kidney cancer   . Family history of breast cancer   . Uterine cancer ([place]) 02/08/2018  . Endometrial adenocarcinoma ([place]) 01/21/2018  . Smokes cigars 12/06/2017  . Nodular basal cell carcinoma (BCC) 09/15/2017  . Foot callus 08/30/2017  .  Nonhealing skin ulcer, limited to breakdown of skin ([place]) 08/30/2017  . Claw toe, acquired, right 08/30/2017  . Osteopenia of neck of left femur 08/17/2016  . Irritable bowel syndrome 12/03/2015  . Incontinence of feces with fecal urgency 12/03/2015  . Controlled type 2 diabetes mellitus without complication, without Carlyn Mullenbach-term current use of insulin ([place]) 09/09/2015  . Hyperlipidemia associated with type 2 diabetes mellitus ([place]) 09/09/2015  . Depression 09/09/2015    Past Surgical History:  Procedure Laterality Date  . ABDOMINAL HYSTERECTOMY     Dr. Gerarda Fraction 02-08-18   . CATARACT EXTRACTION     RIGHT EYE  . HYSTEROSCOPY WITH D & C N/A 01/05/2018   Procedure: DILATATION AND CURETTAGE/HYSTEROSCOPY;  Surgeon: Florian Buff, MD;  Location: [facility];  Service: Gynecology;  Laterality: N/A;  . LUMBAR [place] SURGERY  2006  . POLYPECTOMY N/A 01/05/2018   Procedure: ENDOMETRIAL POLYPECTOMY;  Surgeon: Florian Buff, MD;  Location: [facility];  Service: Gynecology;  Laterality: N/A;  . ROBOTIC ASSISTED TOTAL HYSTERECTOMY WITH BILATERAL SALPINGO OOPHERECTOMY Bilateral 02/08/2018   Procedure: XI ROBOTIC ASSISTED TOTAL HYSTERECTOMY WITH BILATERAL SALPINGO OOPHORECTOMY;  Surgeon: Isabel Caprice, MD;  Location: [facility];  Service: Gynecology;  Laterality: Bilateral;  . SENTINEL NODE BIOPSY Bilateral 02/08/2018   Procedure: SENTINEL NODE BIOPSY AND PELVIC LYMPH NODE SAMPLING;  Surgeon: Isabel Caprice,  MD;  Location: [facility];  Service: Gynecology;  Laterality: Bilateral;  . TONSILLECTOMY     as a child    Allergies Latex  Family History  Problem Relation Age of Onset  . Cervical cancer Mother 60       treated with radiation  . Pancreatic cancer Mother 57  . Stroke Father   . Pulmonary fibrosis Brother   . Alzheimer's disease Brother   . Stomach cancer Brother        dx 35s  . Diabetes Brother   . Lung cancer Maternal Grandmother   . Stroke Paternal Grandfather   . Uterine cancer Maternal Aunt          dx 92s  . Kidney cancer Maternal Uncle        dx 55s  . Colon cancer Maternal Uncle        dx 6s  . Breast cancer Cousin        maternal cousin dx 52    Social History Social History   Tobacco Use  . Smoking status: Current Every Day Smoker    Years: 45.00    Types: Cigars  . Smokeless tobacco: Never Used  . Tobacco comment: 8 small cigars a day  Substance Use Topics  . Alcohol use: Not Currently  . Drug use: No    Review of Systems  Constitutional: No fever/chills Eyes: No visual changes. ENT: No sore throat. Cardiovascular: Denies chest pain. Respiratory: Denies shortness of breath. Gastrointestinal: Positive abdominal pain. Positive nausea and vomiting.  No diarrhea.  No constipation. Genitourinary: Negative for dysuria. Musculoskeletal: Negative for back pain. Skin: Negative for rash. Neurological: Negative for headaches, focal weakness or numbness.  10-point ROS otherwise negative.  ____________________________________________   PHYSICAL EXAM:  VITAL SIGNS: ED Triage Vitals  Enc Vitals Group     BP 06/01/19 0830 (!) 147/63     Pulse Rate 06/01/19 0830 65     Resp 06/01/19 0830 18     Temp 06/01/19 0830 98.3 F (36.8 C)     Temp Source 06/01/19 0830 Oral     SpO2 06/01/19 0830 94 %     Weight 06/01/19 0826 180 lb (81.6 kg)     Height 06/01/19 0826 5\' 7"  (1.702 m)   Constitutional: Alert and oriented. Well appearing and in no acute distress. Eyes: Conjunctivae are normal.  Head: Atraumatic. Nose: No congestion/rhinnorhea. Mouth/Throat: Mucous membranes are moist.  Neck: No stridor.   Cardiovascular: Normal rate, regular rhythm. Good peripheral circulation. Grossly normal heart sounds.   Respiratory: Normal respiratory effort.  No retractions. Lungs CTAB. Gastrointestinal: Soft with firm umbilical hernia palpated. No peritonitis. No distention.  Musculoskeletal: No gross deformities of extremities. Neurologic:  Normal speech and language.   Skin:  Skin is warm, dry and intact. No rash noted.  ____________________________________________   LABS (all labs ordered are listed, but only abnormal results are displayed)  Labs Reviewed  COMPREHENSIVE METABOLIC PANEL - Abnormal; Notable for the following components:      Result Value   Sodium 134 (*)    Glucose, Bld 333 (*)    Total Bilirubin 1.3 (*)    All other components within normal limits  CBC WITH DIFFERENTIAL/PLATELET - Abnormal; Notable for the following components:   WBC 11.4 (*)    RBC 5.27 (*)    Hemoglobin 15.6 (*)    HCT 46.8 (*)    Platelets 137 (*)    Neutro Abs 9.7 (*)    All other components within  normal limits  RESPIRATORY PANEL BY RT PCR (FLU A&B, COVID)  LIPASE, BLOOD  LACTIC ACID, PLASMA   ____________________________________________  EKG   EKG Interpretation  Date/Time:  Thursday June 01 2019 13:58:04 EDT Ventricular Rate:  76 PR Interval:    QRS Duration: 108 QT Interval:  429 QTC Calculation: 483 R Axis:   -28 Text Interpretation: Sinus rhythm Low voltage, precordial leads Left ventricular hypertrophy No STEMI Confirmed by Nanda Quinton [phone]) on 06/01/2019 2:58:58 PM       ____________________________________________  RADIOLOGY  CT ABDOMEN PELVIS W CONTRAST  Result Date: 06/01/2019 CLINICAL DATA:  Abdominal pain with nausea.  Umbilical region hernia EXAM: CT ABDOMEN AND PELVIS WITH CONTRAST TECHNIQUE: Multidetector CT imaging of the abdomen and pelvis was performed using the standard protocol following bolus administration of intravenous contrast. CONTRAST:  148mL OMNIPAQUE IOHEXOL 300 MG/ML  SOLN COMPARISON:  September 01, 2017 FINDINGS: Lower chest: There is atelectatic change in the posterior and lateral right base regions. There is a 3 mm nodular opacity abutting the pleura in the posterior segment of the left lower lobe. There is a small foramen of Bochdalek hernia on the left posteriorly containing only fat. Hepatobiliary: There is  hepatic steatosis. No focal liver lesions are evident. Gallbladder wall is not appreciably thickened. There is no biliary duct dilatation. Pancreas: No pancreatic mass or inflammatory focus. Spleen: No splenic lesions are evident. Adrenals/Urinary Tract: Adrenals bilaterally appear normal. There is a cyst arising from the mid left kidney measuring 2.5 x 2.5 cm. There is no appreciable hydronephrosis on either side. No intrarenal calculus evident; note that contrast is seen in the collecting systems bilaterally which could mask small nonobstructing intrarenal calculi. There is no demonstrable ureteral calculus on either side. Urinary bladder is midline with wall thickness within normal limits. Stomach/Bowel: There is a sizable inguinal hernia. Small bowel extends into this hernia. There is a transition zone at the level of the umbilical hernia consistent with bowel obstruction at the level of the umbilical hernia. There appears to be a degree twisting of the small bowel within this hernia. There is no free air or portal venous air. The terminal ileum appears normal. Vascular/Lymphatic: No abdominal aortic aneurysm. There is aortic and iliac artery atherosclerosis. Major venous structures appear patent. There is no evident adenopathy in the abdomen or pelvis. Reproductive: Uterus absent.  No pelvic mass. Other: Appendix appears normal. No evident abscess or ascites in the abdomen or pelvis. The umbilical hernia that contains bowel with obstruction is mentioned above. This hernia also contains fat. The neck of this hernia measures 1.6 cm from superior to inferior dimension and 2.0 cm from right to left dimension. Musculoskeletal: Postoperative changes noted in the lower lumbar spine. There is degenerative type change in the lumbar spine and hip joints. No blastic or lytic bone lesions. No intramuscular lesions are evident. Note that there is moderately severe spinal stenosis at L3-4 due to bony hypertrophy and diffuse  disc protrusion. Moderate stenosis is also noted at L4-5 due to similar factors. IMPRESSION: 1. There is an umbilical hernia which contains fat and a loop of small bowel. There is apparent twisting of bowel within this hernia with focal transition zone indicative of focal small-bowel obstruction at the level of this umbilical hernia. There is no intramural air or fluid surrounding this twisted bowel within the hernia. No free air. 2.  Appendix appears normal.  No abscess in the abdomen or pelvis. 3. Fairly severe spinal stenosis at L3-4 and to  a slightly lesser extent at L4-5 due to disc protrusion and bony hypertrophy. 4.  Aortic Atherosclerosis (ICD10-I70.0). 5. 3 mm nodular opacity abutting the pleura in the posterior left base. No follow-up needed if patient is low-risk. Non-contrast chest CT can be considered in 12 months if patient is high-risk. This recommendation follows the consensus statement: Guidelines for Management of Incidental Pulmonary Nodules Detected on CT Images: From the Fleischner Society 2017; Radiology 2017; 284:228-243. 6.  Uterus absent. Electronically Signed   By: Lowella Grip III M.D.   On: 06/01/2019 13:13   DG Chest Port 1 View  Result Date: 06/01/2019 CLINICAL DATA:  Preoperative assessment.  Tobacco use. EXAM: PORTABLE CHEST 1 VIEW COMPARISON:  February 02, 2018 FINDINGS: Lungs are clear. Heart size and pulmonary vascularity are normal. No adenopathy. No pneumothorax. No bone lesions. IMPRESSION: Lungs clear.  Cardiac silhouette within normal limits. Electronically Signed   By: Lowella Grip III M.D.   On: 06/01/2019 14:11   DG Abd Portable 1 View  Result Date: 06/01/2019 CLINICAL DATA:  Bedside nasogastric tube placement. Current history of small-bowel obstruction as identified on CT earlier today, due to small bowel within an umbilical hernia. EXAM: PORTABLE ABDOMEN - 1 VIEW COMPARISON:  CT abdomen and pelvis earlier same day. FINDINGS: Nasogastric tube looped in  the stomach with its tip overlying the expected location of the fundus. Dilated gas-filled loops of small bowel centrally in the abdomen indicating small bowel obstruction as identified on the CT earlier today. Contrast material within a mildly distended LEFT renal collecting system and proximal and mid LEFT ureter which can be followed to the level of the upper pelvis where there is an apparent filling defect; a calculus was not present in the distal LEFT ureter on the earlier CT. Contrast is present within a normal appearing RIGHT renal collecting system and RIGHT ureter. Note is made of a large diverticulum arising from the superior bladder as noted on CT. IMPRESSION: 1. Nasogastric tube looped in the stomach with its tip overlying the expected location of the fundus. 2. Small bowel obstruction as demonstrated on CT earlier today. 3. Possible filling defect within the distal LEFT ureter, with mild LEFT hydroureteronephrosis. As there was no distal LEFT ureteral calculus on the CT earlier, a ureteral mass is not excluded. Does the patient have hematuria? One might consider urology consultation and retrograde urography once the patient's acute small-bowel obstruction has resolved. Electronically Signed   By: Evangeline Dakin M.D.   On: 06/01/2019 15:09    ____________________________________________   PROCEDURES  Procedure(s) performed:   Procedures  CRITICAL CARE Performed by: Margette Fast Total critical care time: 35 minutes Critical care time was exclusive of separately billable procedures and treating other patients. Critical care was necessary to treat or prevent imminent or life-threatening deterioration. Critical care was time spent personally by me on the following activities: development of treatment plan with patient and/or surrogate as well as nursing, discussions with consultants, evaluation of patient's response to treatment, examination of patient, obtaining history from patient or  surrogate, ordering and performing treatments and interventions, ordering and review of laboratory studies, ordering and review of radiographic studies, pulse oximetry and re-evaluation of patient's condition.  Nanda Quinton, MD Emergency Medicine  ____________________________________________   INITIAL IMPRESSION / ASSESSMENT AND PLAN / ED COURSE  Pertinent labs & imaging results that were available during my care of the patient were reviewed by me and considered in my medical decision making (see chart for details).  Patient presents to the emergency department for evaluation of firm umbilical hernia with increased pain.  She continues to pass flatus and have bowel movements but has had 2 episodes of vomiting.  Plan for screening lab work including lactate.  Will give Dilaudid and attempt reduction at the bedside.   Unable to reduce the area at bedside with gentle, steady pressure.  Sent the patient for CT scan which confirmed my suspected diagnosis of incarcerated umbilical hernia.  Discussed the case with Dr. Constance Haw who evaluated the patient at bedside and has placed order for NG tube and will go to the OR later today for repair.   Will send COVID PCR which is in process. Post-NG tube placement plain film ordered.  Discussed patient's case with General Surgery, Dr. Constance Haw to request admission. Patient and family (if present) updated with plan. Care transferred to Surgery service.  I reviewed all nursing notes, vitals, pertinent old records, EKGs, labs, imaging (as available).  ____________________________________________  FINAL CLINICAL IMPRESSION(S) / ED DIAGNOSES  Final diagnoses:  Incarcerated umbilical hernia     MEDICATIONS GIVEN DURING THIS VISIT:  Medications  lactated ringers infusion (has no administration in time range)  insulin aspart (novoLOG) injection 10 Units (has no administration in time range)  HYDROmorphone (DILAUDID) injection 1 mg (1 mg Intravenous Given  06/01/19 0842)  ondansetron (ZOFRAN) injection 4 mg (4 mg Intravenous Given 06/01/19 0842)  iohexol (OMNIPAQUE) 300 MG/ML solution 100 mL (100 mLs Intravenous Contrast Given 06/01/19 1053)  HYDROmorphone (DILAUDID) injection 1 mg (1 mg Intravenous Given 06/01/19 1144)  lactated ringers bolus 1,000 mL (1,000 mLs Intravenous New Bag/Given 06/01/19 1410)    Note:  This document was prepared using Dragon voice recognition software and may include unintentional dictation errors.  Nanda Quinton, MD, [facility] Emergency Medicine    Deisi Salonga, [facility], MD 06/01/19 1539

## 2019-06-01 NOTE — Anesthesia Postprocedure Evaluation (Signed)
Anesthesia Post Note  Patient: Debra Miranda  Procedure(s) Performed: HERNIA REPAIR UMBILICAL ADULT (N/A ) SMALL BOWEL RESECTION (N/A Abdomen) OMENTECTOMY (N/A )  Patient location during evaluation: PACU Anesthesia Type: General Level of consciousness: awake and alert, oriented and sedated Pain management: pain level controlled Vital Signs Assessment: post-procedure vital signs reviewed and stable Respiratory status: spontaneous breathing and respiratory function stable Cardiovascular status: blood pressure returned to baseline Postop Assessment: no apparent nausea or vomiting Anesthetic complications: no     Last Vitals:  Vitals:   06/01/19 1916 06/01/19 1930  BP:  (!) 141/75  Pulse:  (!) 102  Resp:  16  Temp:    SpO2: 96% 99%    Last Pain:  Vitals:   06/01/19 1631  TempSrc: Oral  PainSc: 9                  Emmylou Bieker C Pleasant Bensinger

## 2019-06-01 NOTE — ED Triage Notes (Signed)
Ems reports pt called out for abd pain that started yesterday.  Reports has had umbilical hernia for the past 10 years but says is usually soft and nontender.  Reports started having pain after bending over and picking up a box.  Reports now area is tender, hard to touch, and has had n/v/d.  CBG 334.

## 2019-06-01 NOTE — Progress Notes (Signed)
[facility]  Patient with ischemic bowel and resection. NG in place NPO. Foley in place for now. Urine was cloudy and UA/ culture sent on placement of foley.  Vaginal candidiasis suspected due to redness and irritation of the perineum but could be from radiation also. Diflucan 200 mg once IV ordered.   Patient had asked me to notify her cousin, Ronald Pippins after surgery. Called Ronald Pippins who is going to notify [name], domestic partner.  They plan to see patient tomorrow.  Curlene Labrum, MD [facility] [address] [place], [place] [address] [phone] (office)

## 2019-06-01 NOTE — ED Notes (Signed)
Number 16 fr ng tube placed, auscultated for placement, double checked by second RN, positive stomach contents out.  Pt tolerated well.

## 2019-06-01 NOTE — Anesthesia Preprocedure Evaluation (Addendum)
Anesthesia Evaluation  Patient identified by MRN, date of birth, ID band Patient awake    Reviewed: Allergy & Precautions, NPO status , Patient's Chart, lab work & pertinent test results  History of Anesthesia Complications (+) POST - OP SPINAL HEADACHE  Airway Mallampati: III  TM Distance: >3 FB Neck ROM: Full  Mouth opening: Limited Mouth Opening  Dental no notable dental hx. (+) Edentulous Upper, Dental Advisory Given, Missing, Poor Dentition, Chipped   Pulmonary neg pulmonary ROS, Current Smoker and Patient abstained from smoking.,    Pulmonary exam normal breath sounds clear to auscultation       Cardiovascular Exercise Tolerance: Good negative cardio ROS Normal cardiovascular exam Rhythm:Regular Rate:Normal  01-Jun-2019 13:58:04 [facility] ROUTINE RECORD Sinus rhythm Low voltage, precordial leads Left ventricular hypertrophy   Neuro/Psych PSYCHIATRIC DISORDERS Depression negative neurological ROS     GI/Hepatic Neg liver ROS, Incarcerated umbilical hernia,   Endo/Other  negative endocrine ROSdiabetes, Poorly Controlled, Type 2, Oral Hypoglycemic Agents  Renal/GU negative Renal ROS  negative genitourinary   Musculoskeletal  (+) Arthritis , Osteoarthritis,    Abdominal   Peds  Hematology negative hematology ROS (+)   Anesthesia Other Findings Endometrial cancer  Reproductive/Obstetrics                          Anesthesia Physical  Anesthesia Plan  ASA: III and emergent  Anesthesia Plan: General   Post-op Pain Management:    Induction: Intravenous, Rapid sequence and Cricoid pressure planned  PONV Risk Score and Plan: 2 and 3 and Dexamethasone, Ondansetron and Midazolam  Airway Management Planned: Oral ETT  Additional Equipment:   Intra-op Plan:   Post-operative Plan: Extubation in OR and Possible Post-op intubation/ventilation  Informed Consent: I  have reviewed the patients History and Physical, chart, labs and discussed the procedure including the risks, benefits and alternatives for the proposed anesthesia with the patient or authorized representative who has indicated his/her understanding and acceptance.     Dental advisory given  Plan Discussed with: Surgeon  Anesthesia Plan Comments:       Anesthesia Quick Evaluation

## 2019-06-01 NOTE — H&P (Addendum)
[facility] History and Physical  Reason for Referral: Umbilical hernia  Referring Physician:  Dr. Laverta Baltimore   Chief Complaint    Abdominal Pain      Debra Miranda is a 74 y.o. female.  HPI:  Ms .Sharaf is a very sweet 74 yo with a longstanding history of an umbilical hernia that normally is soft but she reports has been hard and stuck out since Monday. She says she has been having nausea and vomiting since that time and reports some diarrhea but this has stopped since Tuesday. She came to the ED with worsening pain and the hernia being hard.  She otherwise has a history of DM, endometrial cancer s/p hysterectomy and radiation.  She says she has not been eating.    Her pain is constant in the umbilical area and is sharp in nature.  She denies any prior heart attack or chest pain recently.    Past Medical History:  Diagnosis Date  . Arthritis    right hand  . Cataract    RIGHT EYE; now removed  . Depression    many years ago, no meds  . Diabetes mellitus without complication ([place])    type 2  . Endometrial adenocarcinoma ([place]) 01/05/2018   treated with HDR  . Environmental allergies   . Family history of breast cancer   . Family history of kidney cancer   . Family history of pancreatic cancer   . Family history of stomach cancer   . Family history of uterine cancer   . Hemorrhoids   . Nodular basal cell carcinoma ([facility]) 08/2017   LEFT LOWER LEG above foot  . Orthostatic hypotension    Patient is unaware of this diagnosis    Past Surgical History:  Procedure Laterality Date  . ABDOMINAL HYSTERECTOMY     Dr. Gerarda Fraction 02-08-18   . CATARACT EXTRACTION     RIGHT EYE  . HYSTEROSCOPY WITH D & C N/A 01/05/2018   Procedure: DILATATION AND CURETTAGE/HYSTEROSCOPY;  Surgeon: Florian Buff, MD;  Location: [facility];  Service: Gynecology;  Laterality: N/A;  . LUMBAR [place] SURGERY  2006  . POLYPECTOMY N/A 01/05/2018   Procedure: ENDOMETRIAL POLYPECTOMY;  Surgeon: Florian Buff, MD;  Location: [facility];  Service: Gynecology;  Laterality: N/A;  . ROBOTIC ASSISTED TOTAL HYSTERECTOMY WITH BILATERAL SALPINGO OOPHERECTOMY Bilateral 02/08/2018   Procedure: XI ROBOTIC ASSISTED TOTAL HYSTERECTOMY WITH BILATERAL SALPINGO OOPHORECTOMY;  Surgeon: Isabel Caprice, MD;  Location: [facility];  Service: Gynecology;  Laterality: Bilateral;  . SENTINEL NODE BIOPSY Bilateral 02/08/2018   Procedure: SENTINEL NODE BIOPSY AND PELVIC LYMPH NODE SAMPLING;  Surgeon: Isabel Caprice, MD;  Location: [facility];  Service: Gynecology;  Laterality: Bilateral;  . TONSILLECTOMY     as a child    Family History  Problem Relation Age of Onset  . Cervical cancer Mother 39       treated with radiation  . Pancreatic cancer Mother 73  . Stroke Father   . Pulmonary fibrosis Brother   . Alzheimer's disease Brother   . Stomach cancer Brother        dx 55s  . Diabetes Brother   . Lung cancer Maternal Grandmother   . Stroke Paternal Grandfather   . Uterine cancer Maternal Aunt        dx 79s  . Kidney cancer Maternal Uncle        dx 67s  . Colon cancer Maternal Uncle  dx 60s  . Breast cancer Cousin        maternal cousin dx 66    Social History   Tobacco Use  . Smoking status: Current Every Day Smoker    Years: 45.00    Types: Cigars  . Smokeless tobacco: Never Used  . Tobacco comment: 8 small cigars a day  Substance Use Topics  . Alcohol use: Not Currently  . Drug use: No    Medications: I have reviewed the patient's current medications. Current Facility-Administered Medications  Medication Dose Route Frequency Provider Last Rate Last Admin  . [START ON 06/02/2019] ceFAZolin (ANCEF) IVPB 2g/100 mL premix  2 g Intravenous On Call to OR Virl Cagey, MD      . Chlorhexidine Gluconate Cloth 2 % PADS 6 each  6 each Topical Once Virl Cagey, MD      . dextrose 5 % in lactated ringers infusion   Intravenous Continuous Battula, Rajamani C, MD      . lactated ringers  infusion   Intravenous Continuous Virl Cagey, MD       Current Outpatient Medications  Medication Sig Dispense Refill Last Dose  . atorvastatin (LIPITOR) 20 MG tablet Take 1 tablet (20 mg total) by mouth daily. 90 tablet 3 Past Week at Unknown time  . Bacillus Coagulans-Inulin (PROBIOTIC) 1-250 BILLION-MG CAPS Take by mouth.   Past Week at Unknown time  . Calcium Carbonate-Vit D-Min (CALCIUM 1200 PO) Take 1 tablet by mouth at bedtime.    Past Week at Unknown time  . cetirizine (ZYRTEC) 10 MG tablet Take 10 mg by mouth daily.   Past Week at Unknown time  . Cholecalciferol (VITAMIN D) 2000 UNITS CAPS Take 2,000 Units by mouth at bedtime.    Past Week at Unknown time  . diclofenac Sodium (VOLTAREN) 1 % GEL Apply 2-4 g topically 4 (four) times daily. If not covered by ins, please help her find the OTC version 200 g 3 Past Week at Unknown time  . empagliflozin (JARDIANCE) 25 MG TABS tablet Take 25 mg by mouth daily before breakfast. 30 tablet 2 Past Week at Unknown time  . escitalopram (LEXAPRO) 10 MG tablet Take 1 tablet (10 mg total) by mouth daily. 90 tablet 3 Past Week at Unknown time  . Ferrous Sulfate (IRON) 90 (18 Fe) MG TABS Take by mouth.   Past Week at Unknown time  . Multiple Vitamins-Minerals (ONE-A-DAY VITACRAVES) CHEW Chew by mouth.   Past Week at Unknown time  . Pumpkin Seed-Soy Germ (AZO BLADDER CONTROL/GO-LESS PO) Take by mouth.   Past Week at Unknown time  . sitaGLIPtin (JANUVIA) 100 MG tablet Take 100 mg by mouth at bedtime.    Past Week at Unknown time  . metFORMIN (GLUCOPHAGE) 500 MG tablet Take 1 tablet (500 mg total) by mouth 2 (two) times daily with a meal. (patient prefers IR over XR. Please cancel previous rx) (Patient not taking: Reported on 06/01/2019) 180 tablet 3 Not Taking at Unknown time  . nystatin (MYCOSTATIN/NYSTOP) powder Apply topically 2 (two) times daily. 60 g 2    Allergies  Allergen Reactions  . Latex Itching     ROS:  A comprehensive review of  systems was negative except for: Gastrointestinal: positive for abdominal pain, diarrhea, nausea and vomiting  Blood pressure (!) 141/76, pulse 65, temperature 98.5 F (36.9 C), temperature source Oral, resp. rate 17, height 5\' 7"  (1.702 m), weight 81.6 kg, SpO2 96 %. Physical Exam Vitals reviewed.  Constitutional:  Appearance: She is well-developed.  HENT:     Head: Normocephalic and atraumatic.  Cardiovascular:     Rate and Rhythm: Normal rate and regular rhythm.  Pulmonary:     Effort: Pulmonary effort is normal.  Abdominal:     General: There is no distension.     Palpations: Abdomen is soft.     Tenderness: There is abdominal tenderness.     Hernia: A hernia is present. Hernia is present in the umbilical area.     Comments: Incarcerated umbilical hernia, redness of the skin  Musculoskeletal:     Comments: Moves all extremities   Skin:    General: Skin is warm and dry.  Neurological:     General: No focal deficit present.     Mental Status: She is alert and oriented to person, place, and time.  Psychiatric:        Mood and Affect: Mood normal.        Behavior: Behavior normal.     Results: Results for orders placed or performed during the hospital encounter of 06/01/19 (from the past 48 hour(s))  Comprehensive metabolic panel     Status: Abnormal   Collection Time: 06/01/19  8:47 AM  Result Value Ref Range   Sodium 134 (L) 135 - 145 mmol/L   Potassium 3.8 3.5 - 5.1 mmol/L   Chloride 98 98 - 111 mmol/L   CO2 23 22 - 32 mmol/L   Glucose, Bld 333 (H) 70 - 99 mg/dL    Comment: Glucose reference range applies only to samples taken after fasting for at least 8 hours.   BUN 22 8 - 23 mg/dL   Creatinine, Ser 0.55 0.44 - 1.00 mg/dL   Calcium 9.3 8.9 - 10.3 mg/dL   Total Protein 7.2 6.5 - 8.1 g/dL   Albumin 4.1 3.5 - 5.0 g/dL   AST 15 15 - 41 U/L   ALT 18 0 - 44 U/L   Alkaline Phosphatase 108 38 - 126 U/L   Total Bilirubin 1.3 (H) 0.3 - 1.2 mg/dL   GFR calc non Af  Amer >60 >60 mL/min   GFR calc Af Amer >60 >60 mL/min   Anion gap 13 5 - 15    Comment: Performed at [facility], [address] [place], [place] [address]  Lipase, blood     Status: None   Collection Time: 06/01/19  8:47 AM  Result Value Ref Range   Lipase 18 11 - 51 U/L    Comment: Performed at [facility], [address] [place], [place] [address]  CBC with Differential     Status: Abnormal   Collection Time: 06/01/19  8:47 AM  Result Value Ref Range   WBC 11.4 (H) 4.0 - 10.5 K/uL   RBC 5.27 (H) 3.87 - 5.11 MIL/uL   Hemoglobin 15.6 (H) 12.0 - 15.0 g/dL   HCT 46.8 (H) 36.0 - 46.0 %   MCV 88.8 80.0 - 100.0 fL   MCH 29.6 26.0 - 34.0 pg   MCHC 33.3 30.0 - 36.0 g/dL   RDW 13.2 11.5 - 15.5 %   Platelets 137 (L) 150 - 400 K/uL   nRBC 0.0 0.0 - 0.2 %   Neutrophils Relative % 85 %   Neutro Abs 9.7 (H) 1.7 - 7.7 K/uL   Lymphocytes Relative 10 %   Lymphs Abs 1.1 0.7 - 4.0 K/uL   Monocytes Relative 5 %   Monocytes Absolute 0.5 0.1 - 1.0 K/uL   Eosinophils Relative 0 %  Eosinophils Absolute 0.0 0.0 - 0.5 K/uL   Basophils Relative 0 %   Basophils Absolute 0.0 0.0 - 0.1 K/uL   Immature Granulocytes 0 %   Abs Immature Granulocytes 0.04 0.00 - 0.07 K/uL    Comment: Performed at [facility], [address] [place], [place] [address]  Lactic acid, plasma     Status: None   Collection Time: 06/01/19  9:22 AM  Result Value Ref Range   Lactic Acid, Venous 0.9 0.5 - 1.9 mmol/L    Comment: Performed at [facility], [address] [place], [place] [address]   Personally reviewed- incarcerated umbilical hernia with small bowel  CT ABDOMEN PELVIS W CONTRAST  Result Date: 06/01/2019 CLINICAL DATA:  Abdominal pain with nausea.  Umbilical region hernia EXAM: CT ABDOMEN AND PELVIS WITH CONTRAST TECHNIQUE: Multidetector CT imaging of the abdomen and pelvis was performed using the standard protocol following bolus administration of intravenous contrast. CONTRAST:  174mL OMNIPAQUE IOHEXOL 300 MG/ML   SOLN COMPARISON:  September 01, 2017 FINDINGS: Lower chest: There is atelectatic change in the posterior and lateral right base regions. There is a 3 mm nodular opacity abutting the pleura in the posterior segment of the left lower lobe. There is a small foramen of Bochdalek hernia on the left posteriorly containing only fat. Hepatobiliary: There is hepatic steatosis. No focal liver lesions are evident. Gallbladder wall is not appreciably thickened. There is no biliary duct dilatation. Pancreas: No pancreatic mass or inflammatory focus. Spleen: No splenic lesions are evident. Adrenals/Urinary Tract: Adrenals bilaterally appear normal. There is a cyst arising from the mid left kidney measuring 2.5 x 2.5 cm. There is no appreciable hydronephrosis on either side. No intrarenal calculus evident; note that contrast is seen in the collecting systems bilaterally which could mask small nonobstructing intrarenal calculi. There is no demonstrable ureteral calculus on either side. Urinary bladder is midline with wall thickness within normal limits. Stomach/Bowel: There is a sizable inguinal hernia. Small bowel extends into this hernia. There is a transition zone at the level of the umbilical hernia consistent with bowel obstruction at the level of the umbilical hernia. There appears to be a degree twisting of the small bowel within this hernia. There is no free air or portal venous air. The terminal ileum appears normal. Vascular/Lymphatic: No abdominal aortic aneurysm. There is aortic and iliac artery atherosclerosis. Major venous structures appear patent. There is no evident adenopathy in the abdomen or pelvis. Reproductive: Uterus absent.  No pelvic mass. Other: Appendix appears normal. No evident abscess or ascites in the abdomen or pelvis. The umbilical hernia that contains bowel with obstruction is mentioned above. This hernia also contains fat. The neck of this hernia measures 1.6 cm from superior to inferior dimension and  2.0 cm from right to left dimension. Musculoskeletal: Postoperative changes noted in the lower lumbar spine. There is degenerative type change in the lumbar spine and hip joints. No blastic or lytic bone lesions. No intramuscular lesions are evident. Note that there is moderately severe spinal stenosis at L3-4 due to bony hypertrophy and diffuse disc protrusion. Moderate stenosis is also noted at L4-5 due to similar factors. IMPRESSION: 1. There is an umbilical hernia which contains fat and a loop of small bowel. There is apparent twisting of bowel within this hernia with focal transition zone indicative of focal small-bowel obstruction at the level of this umbilical hernia. There is no intramural air or fluid surrounding this twisted bowel within the hernia. No free air. 2.  Appendix appears normal.  No abscess in the abdomen or pelvis. 3. Fairly severe spinal stenosis at L3-4 and to a slightly lesser extent at L4-5 due to disc protrusion and bony hypertrophy. 4.  Aortic Atherosclerosis (ICD10-I70.0). 5. 3 mm nodular opacity abutting the pleura in the posterior left base. No follow-up needed if patient is low-risk. Non-contrast chest CT can be considered in 12 months if patient is high-risk. This recommendation follows the consensus statement: Guidelines for Management of Incidental Pulmonary Nodules Detected on CT Images: From the Fleischner Society 2017; Radiology 2017; 284:228-243. 6.  Uterus absent. Electronically Signed   By: Lowella Grip III M.D.   On: 06/01/2019 13:13    Assessment & Plan:  AUTIANA BAULT is a 74 y.o. female with an incarcerated umbilical hernia with signs of obstruction with nausea and vomiting. The bowel potentially could be getting strangulated but labs are reassuring. She is dehydrated given the lack of intake.  -EKG and CXR preop -COVID testing -Discussed risk of surgery including bleeding, infection, resection of bowel, injury to other organs, possible mesh use, possible  recurrence, need for admission and NG tube for the obstruction  -She has some family in the area, but her children are further away. She wants me to contact her cousin after surgery.   All questions were answered to the satisfaction of the patient.  Virl Cagey 06/01/2019, 1:38 PM

## 2019-06-01 NOTE — ED Notes (Signed)
Pt in bed, pt reports 8/10 abd pain, pt requests pain med

## 2019-06-01 NOTE — Anesthesia Procedure Notes (Addendum)
Procedure Name: Intubation Date/Time: 06/01/2019 7:40 PM Performed by: Denese Killings, MD Pre-anesthesia Checklist: Patient identified, Emergency Drugs available, Suction available and Patient being monitored Patient Re-evaluated:Patient Re-evaluated prior to induction Oxygen Delivery Method: Circle system utilized Preoxygenation: Pre-oxygenation with 100% oxygen Induction Type: IV induction and Rapid sequence Laryngoscope Size: Mac and 3 Grade View: Grade I Tube type: Oral Tube size: 7.0 mm Number of attempts: 1 Airway Equipment and Method: Stylet Placement Confirmation: ETT inserted through vocal cords under direct vision,  positive ETCO2 and breath sounds checked- equal and bilateral Secured at: 22 cm Tube secured with: Tape Dental Injury: Teeth and Oropharynx as per pre-operative assessment

## 2019-06-01 NOTE — Op Note (Signed)
[facility] Operative Note  06/01/19  Preoperative Diagnosis: Incarcerated umbilical hernia    Postoperative Diagnosis: Strangulated umbilical hernia with ischemic small bowel    Procedure(s) Performed: Partial small bowel resection with primary side to side anastomosis, partial omentectomy, primary umbilical hernia repair    Surgeon: Lanell Matar. Constance Haw, MD   Assistants: No qualified resident was available    Anesthesia: General endotracheal   Anesthesiologist: Denese Killings, MD    Specimens: Small bowel and omentum    Estimated Blood Loss: Minimal   Blood Replacement: None    Complications: None    Wound Class:Contaminated    Operative Indications:  Ms. Ben is a 74 yo who comes in with a 4 day+ history of abdominal pain and an incarcerated umbilical hernia with reported nausea, vomiting and no further BMs. She had a CT that demonstrated an incarcerated hernia with a loop of small intestine. The ED attempted reduction but this failed.  Given this incarcerated hernia I told the patient she need a surgery and possible bowel resection. We discussed the risk of bleeding, infection, repair of hernia, possible use of mesh if able, possible small bowel resection, and possible injury to other organs. I opted to proceed.   Findings: Ischemic small bowel and omentum    Procedure: The patient was taken to the operating room and placed supine. General endotracheal anesthesia was induced. Intravenous antibiotics were administered per protocol.  A nasogastric tube was previously positioned to decompress the stomach given her obstruction. A foley catheter was placed and the patient's perineum appeared friable as if there was a yeast infection, and her urine ewas cloudy. This was sent for UA and culture. The abdomen was prepared and draped in the usual sterile fashion.   An incision was made around the umbilicus and carried down through to the fascia with  electrocautery. Given the size and extent of the hernia and the fear of ischemic bowel, the hernia sac was opened with great care using scissors.  The omentum appeared ischemic with some discoloration and a small loop of small bowel was obviously ischemic without signs of perforation.  The hernia defect was very small, and the fascia defect was extended inferiorly about 2cm to allow for further evisceration of the small bowel and omentum.  The omentum that was compromised and appeared ischemic was taken with a Ligasure down to healthy appearing omentum.  The small bowel was monitored but areas of ischemia did not resolve.  The bowel was ran a few inches proximally and distally and no other signs of ischemia were noted. Towels were draped off.  The proximal and distal points of resection were identified and taken with a 75 mm linear cutting stapler.  The mesentery was taken with a Ligasure. A side to side anastamosis was performed in the standard fashion in the antimesenteric border using a linear cutting 75 mm stapler, and closing the common enterotomy with a TA 60 load. Sutures were placed in the crotch the side to side anastomosis using 3-0 Silk suture, and the TA staple line was oversewn using Lembert 3-0 Silk interrupted sutures.  The mesentery was closed with a 3-0 Vicryl running suture. The bowel was in proper anatomic alignment without twisting and the portion that was resected measured approximately 10cm.  The wound and bowel was irrigated outside the abdomen as no contamination had occurred in the intraperitoneal space.   The bowel was tucked back into the abdomen and the remaining omentum was placed over the anastomosis.  The fascia defect was closed using a running 0 PDS suture given the contamination and ischemic bowel.  This closed without tension.   The remaining hernia sac was resected and the sac adherent to the umbilical skin was cauterized to help prevent seroma formation. The umbilicus was  re-approximated to the midline with a 3-0 Vicryl interrupted sutures to try to recreate the umbilicus. The deep space was closed with 3-0 Vicryl interrupted suture to prevent seroma formation. The skin was closed with staples. A sterile honeycomb dressing was placed.   Final inspection revealed acceptable hemostasis. All counts were correct at the end of the case. The patient was awakened from anesthesia and extubated without complication.  The patient went to the PACU in stable condition.   Curlene Labrum, MD [facility] [address] [place], [place] [address] [phone] (office)

## 2019-06-01 NOTE — Progress Notes (Signed)
[facility]  Jacinto Reap, cousin to notify, [phone].   Curlene Labrum, MD [facility] [address] [place], [place] [address] [phone] (office)

## 2019-06-01 NOTE — Transfer of Care (Signed)
Immediate Anesthesia Transfer of Care Note  Patient: Debra Miranda  Procedure(s) Performed: HERNIA REPAIR UMBILICAL ADULT (N/A ) SMALL BOWEL RESECTION (N/A Abdomen) OMENTECTOMY (N/A )  Patient Location: PACU  Anesthesia Type:General  Level of Consciousness: awake, alert , oriented and sedated  Airway & Oxygen Therapy: Patient Spontanous Breathing and Patient connected to nasal cannula oxygen  Post-op Assessment: Report given to RN and Post -op Vital signs reviewed and stable  Post vital signs: Reviewed and stable  Last Vitals:  Vitals Value Taken Time  BP 141/75 06/01/19 1930  Temp 36.8 C 06/01/19 1913  Pulse 91 06/01/19 1937  Resp 13 06/01/19 1937  SpO2 93 % 06/01/19 1937  Vitals shown include unvalidated device data.  Last Pain:  Vitals:   06/01/19 1631  TempSrc: Oral  PainSc: 9       Patients Stated Pain Goal: 2 ([id number])  Complications: No apparent anesthesia complications

## 2019-06-01 NOTE — ED Notes (Signed)
Patient ambulatory to restroom  ?

## 2019-06-02 LAB — CBC WITH DIFFERENTIAL/PLATELET
Abs Immature Granulocytes: 0.03 10*3/uL (ref 0.00–0.07)
Basophils Absolute: 0 10*3/uL (ref 0.0–0.1)
Basophils Relative: 0 %
Eosinophils Absolute: 0 10*3/uL (ref 0.0–0.5)
Eosinophils Relative: 0 %
HCT: 45.5 % (ref 36.0–46.0)
Hemoglobin: 14.8 g/dL (ref 12.0–15.0)
Immature Granulocytes: 0 %
Lymphocytes Relative: 9 %
Lymphs Abs: 0.7 10*3/uL (ref 0.7–4.0)
MCH: 29.5 pg (ref 26.0–34.0)
MCHC: 32.5 g/dL (ref 30.0–36.0)
MCV: 90.6 fL (ref 80.0–100.0)
Monocytes Absolute: 0.7 10*3/uL (ref 0.1–1.0)
Monocytes Relative: 9 %
Neutro Abs: 6.3 10*3/uL (ref 1.7–7.7)
Neutrophils Relative %: 82 %
Platelets: 107 10*3/uL — ABNORMAL LOW (ref 150–400)
RBC: 5.02 MIL/uL (ref 3.87–5.11)
RDW: 13.2 % (ref 11.5–15.5)
WBC: 7.8 10*3/uL (ref 4.0–10.5)
nRBC: 0 % (ref 0.0–0.2)

## 2019-06-02 LAB — BASIC METABOLIC PANEL
Anion gap: 11 (ref 5–15)
BUN: 15 mg/dL (ref 8–23)
CO2: 25 mmol/L (ref 22–32)
Calcium: 8.4 mg/dL — ABNORMAL LOW (ref 8.9–10.3)
Chloride: 102 mmol/L (ref 98–111)
Creatinine, Ser: 0.43 mg/dL — ABNORMAL LOW (ref 0.44–1.00)
GFR calc Af Amer: >60 mL/min (ref >60)
GFR calc non Af Amer: >60 mL/min (ref >60)
Glucose, Bld: 105 mg/dL — ABNORMAL HIGH (ref 70–99)
Potassium: 3.2 mmol/L — ABNORMAL LOW (ref 3.5–5.1)
Sodium: 138 mmol/L (ref 135–145)

## 2019-06-02 LAB — GLUCOSE, CAPILLARY
Glucose-Capillary: 230 mg/dL — ABNORMAL HIGH (ref 70–99)
Glucose-Capillary: 264 mg/dL — ABNORMAL HIGH (ref 70–99)
Glucose-Capillary: 201 mg/dL — ABNORMAL HIGH (ref 70–99)
Glucose-Capillary: 99 mg/dL (ref 70–99)
Glucose-Capillary: 148 mg/dL — ABNORMAL HIGH (ref 70–99)

## 2019-06-02 LAB — HEMOGLOBIN A1C
Hgb A1c MFr Bld: 10.6 % — ABNORMAL HIGH (ref 4.8–5.6)
Mean Plasma Glucose: 257.52 mg/dL

## 2019-06-02 LAB — PHOSPHORUS: Phosphorus: 3.1 mg/dL (ref 2.5–4.6)

## 2019-06-02 LAB — MAGNESIUM: Magnesium: 1.8 mg/dL (ref 1.7–2.4)

## 2019-06-02 MED ORDER — METOPROLOL TARTRATE 5 MG/5ML IV SOLN: 5.0000 mg | Freq: Four times a day (QID) | INTRAVENOUS | Status: DC | PRN

## 2019-06-02 MED ORDER — ACETAMINOPHEN 650 MG RE SUPP: 650.0000 mg | Freq: Four times a day (QID) | RECTAL | Status: DC | PRN

## 2019-06-02 MED ORDER — INSULIN ASPART 100 UNIT/ML ~~LOC~~ SOLN
0.0000 [IU] | SUBCUTANEOUS | Status: DC
  Administered 2019-06-02: 2 [IU] via SUBCUTANEOUS
  Administered 2019-06-02: 5 [IU] via SUBCUTANEOUS
  Administered 2019-06-02: 8 [IU] via SUBCUTANEOUS
  Administered 2019-06-02: 3 [IU] via SUBCUTANEOUS
  Administered 2019-06-04 – 2019-06-05 (×2): 2 [IU] via SUBCUTANEOUS
  Administered 2019-06-05: 3 [IU] via SUBCUTANEOUS

## 2019-06-02 MED ORDER — DIPHENHYDRAMINE HCL 50 MG/ML IJ SOLN: 12.5000 mg | Freq: Four times a day (QID) | INTRAMUSCULAR | Status: DC | PRN

## 2019-06-02 MED ORDER — HEPARIN SODIUM (PORCINE) 5000 UNIT/ML IJ SOLN
5000.0000 [IU] | Freq: Three times a day (TID) | INTRAMUSCULAR | Status: DC
  Administered 2019-06-02 – 2019-06-03 (×4): 5000 [IU] via SUBCUTANEOUS
  Filled 2019-06-02 (×4): qty 1

## 2019-06-02 MED ORDER — ONDANSETRON 4 MG PO TBDP
4.0000 mg | ORAL_TABLET | Freq: Four times a day (QID) | ORAL | Status: DC | PRN
  Administered 2019-06-06: 4 mg via ORAL
  Filled 2019-06-02: qty 1

## 2019-06-02 MED ORDER — ACETAMINOPHEN 325 MG PO TABS
650.0000 mg | ORAL_TABLET | Freq: Four times a day (QID) | ORAL | Status: DC | PRN
  Filled 2019-06-02: qty 2

## 2019-06-02 MED ORDER — MAGNESIUM SULFATE 2 GM/50ML IV SOLN
2.0000 g | Freq: Once | INTRAVENOUS | Status: AC
  Administered 2019-06-02: 16:00:00 2 g via INTRAVENOUS
  Filled 2019-06-02: qty 50

## 2019-06-02 MED ORDER — POTASSIUM CHLORIDE 10 MEQ/100ML IV SOLN
10.0000 meq | INTRAVENOUS | Status: AC
  Administered 2019-06-02 (×2): 10 meq via INTRAVENOUS
  Filled 2019-06-02 (×3): qty 100

## 2019-06-02 MED ORDER — FLUCONAZOLE IN SODIUM CHLORIDE 200-0.9 MG/100ML-% IV SOLN
200.0000 mg | Freq: Once | INTRAVENOUS | Status: AC
  Administered 2019-06-02: 06:00:00 200 mg via INTRAVENOUS
  Filled 2019-06-02: qty 100

## 2019-06-02 MED ORDER — MORPHINE SULFATE (PF) 2 MG/ML IV SOLN
2.0000 mg | INTRAVENOUS | Status: DC | PRN
  Administered 2019-06-02 – 2019-06-04 (×4): 2 mg via INTRAVENOUS
  Filled 2019-06-02 (×4): qty 1

## 2019-06-02 MED ORDER — PANTOPRAZOLE SODIUM 40 MG IV SOLR
40.0000 mg | Freq: Every day | INTRAVENOUS | Status: DC
  Administered 2019-06-02 – 2019-06-05 (×4): 40 mg via INTRAVENOUS
  Filled 2019-06-02 (×4): qty 40

## 2019-06-02 MED ORDER — ONDANSETRON HCL 4 MG/2ML IJ SOLN: 4.0000 mg | Freq: Four times a day (QID) | INTRAMUSCULAR | Status: DC | PRN

## 2019-06-02 MED ORDER — SODIUM CHLORIDE 0.9 % IV SOLN
2.0000 g | Freq: Two times a day (BID) | INTRAVENOUS | Status: AC
  Administered 2019-06-02 – 2019-06-03 (×3): 2 g via INTRAVENOUS
  Filled 2019-06-02 (×4): qty 2

## 2019-06-02 MED ORDER — DIPHENHYDRAMINE HCL 12.5 MG/5ML PO ELIX: 12.5000 mg | ORAL_SOLUTION | Freq: Four times a day (QID) | ORAL | Status: DC | PRN

## 2019-06-02 MED ORDER — INSULIN GLARGINE 100 UNIT/ML ~~LOC~~ SOLN
16.0000 [IU] | Freq: Every day | SUBCUTANEOUS | Status: DC
  Administered 2019-06-02 – 2019-06-04 (×3): 16 [IU] via SUBCUTANEOUS
  Filled 2019-06-02 (×4): qty 0.16

## 2019-06-02 NOTE — Addendum Note (Signed)
Addendum  created 06/02/19 0954 by Vista Deck, CRNA   Charge Capture section accepted

## 2019-06-02 NOTE — Progress Notes (Signed)
Ambulated half way down hall with walker and sat up in chair for about 20 minutes.  NG tube secured and connected to LIS. Dressing to abd intact with some old bloody drainage.  ABD binder in place.

## 2019-06-02 NOTE — Addendum Note (Signed)
Addendum  created 06/02/19 1018 by Vista Deck, CRNA   Charge Capture section accepted

## 2019-06-02 NOTE — Progress Notes (Signed)
RT note- Patient found on NRB at 3 L/min. sp02 87%, mask was not on very well, placed on 4l/min [place]. Continue to monitor.

## 2019-06-02 NOTE — Care Management Important Message (Signed)
Important Message  Patient Details  Name: Debra Miranda MRN: [id number] Date of Birth: 1945-10-25   Medicare Important Message Given:  Yes     [name] 06/02/2019, 12:34 PM

## 2019-06-02 NOTE — Progress Notes (Signed)
[facility] Progress Note  1 Day Post-Op  Subjective: No major complaints. Wants some ice. Wants to move area. No flatus or Bms.   Objective: Vital signs in last 24 hours: Temp:  [96.7 F (35.9 C)-100.3 F (37.9 C)] 98.8 F (37.1 C) (04/16 1339) Pulse Rate:  [67-102] 94 (04/16 1353) Resp:  [14-19] 18 (04/16 1339) BP: (117-158)/(61-88) 143/69 (04/16 1339) SpO2:  [89 %-100 %] 92 % (04/16 1353) Last BM Date: 05/31/19  Intake/Output from previous day: 04/15 0701 - 04/16 0700 In: 3075 [I.V.:3075] Out: 425 [Urine:200; Blood:25] Intake/Output this shift: Total I/O In: 70 [I.V.:70] Out: 450 [Urine:450]  General appearance: alert, cooperative and no distress Resp: normal work of breathing GI: soft, nontender, nondistended, midline with dressing, minor staining, abd binder in place Extremities: extremities normal, atraumatic, no cyanosis or edema  Lab Results:  Recent Labs    06/01/19 0847 06/02/19 0542  WBC 11.4* 7.8  HGB 15.6* 14.8  HCT 46.8* 45.5  PLT 137* 107*   BMET Recent Labs    06/01/19 0847 06/02/19 1241  NA 134* 138  K 3.8 3.2*  CL 98 102  CO2 23 25  GLUCOSE 333* 105*  BUN 22 15  CREATININE 0.55 0.43*  CALCIUM 9.3 8.4*    Studies/Results: CT ABDOMEN PELVIS W CONTRAST  Result Date: 06/01/2019 CLINICAL DATA:  Abdominal pain with nausea.  Umbilical region hernia EXAM: CT ABDOMEN AND PELVIS WITH CONTRAST TECHNIQUE: Multidetector CT imaging of the abdomen and pelvis was performed using the standard protocol following bolus administration of intravenous contrast. CONTRAST:  170mL OMNIPAQUE IOHEXOL 300 MG/ML  SOLN COMPARISON:  September 01, 2017 FINDINGS: Lower chest: There is atelectatic change in the posterior and lateral right base regions. There is a 3 mm nodular opacity abutting the pleura in the posterior segment of the left lower lobe. There is a small foramen of Bochdalek hernia on the left posteriorly containing only fat. Hepatobiliary:  There is hepatic steatosis. No focal liver lesions are evident. Gallbladder wall is not appreciably thickened. There is no biliary duct dilatation. Pancreas: No pancreatic mass or inflammatory focus. Spleen: No splenic lesions are evident. Adrenals/Urinary Tract: Adrenals bilaterally appear normal. There is a cyst arising from the mid left kidney measuring 2.5 x 2.5 cm. There is no appreciable hydronephrosis on either side. No intrarenal calculus evident; note that contrast is seen in the collecting systems bilaterally which could mask small nonobstructing intrarenal calculi. There is no demonstrable ureteral calculus on either side. Urinary bladder is midline with wall thickness within normal limits. Stomach/Bowel: There is a sizable inguinal hernia. Small bowel extends into this hernia. There is a transition zone at the level of the umbilical hernia consistent with bowel obstruction at the level of the umbilical hernia. There appears to be a degree twisting of the small bowel within this hernia. There is no free air or portal venous air. The terminal ileum appears normal. Vascular/Lymphatic: No abdominal aortic aneurysm. There is aortic and iliac artery atherosclerosis. Major venous structures appear patent. There is no evident adenopathy in the abdomen or pelvis. Reproductive: Uterus absent.  No pelvic mass. Other: Appendix appears normal. No evident abscess or ascites in the abdomen or pelvis. The umbilical hernia that contains bowel with obstruction is mentioned above. This hernia also contains fat. The neck of this hernia measures 1.6 cm from superior to inferior dimension and 2.0 cm from right to left dimension. Musculoskeletal: Postoperative changes noted in the lower lumbar spine. There is degenerative type change in  the lumbar spine and hip joints. No blastic or lytic bone lesions. No intramuscular lesions are evident. Note that there is moderately severe spinal stenosis at L3-4 due to bony hypertrophy and  diffuse disc protrusion. Moderate stenosis is also noted at L4-5 due to similar factors. IMPRESSION: 1. There is an umbilical hernia which contains fat and a loop of small bowel. There is apparent twisting of bowel within this hernia with focal transition zone indicative of focal small-bowel obstruction at the level of this umbilical hernia. There is no intramural air or fluid surrounding this twisted bowel within the hernia. No free air. 2.  Appendix appears normal.  No abscess in the abdomen or pelvis. 3. Fairly severe spinal stenosis at L3-4 and to a slightly lesser extent at L4-5 due to disc protrusion and bony hypertrophy. 4.  Aortic Atherosclerosis (ICD10-I70.0). 5. 3 mm nodular opacity abutting the pleura in the posterior left base. No follow-up needed if patient is low-risk. Non-contrast chest CT can be considered in 12 months if patient is high-risk. This recommendation follows the consensus statement: Guidelines for Management of Incidental Pulmonary Nodules Detected on CT Images: From the Fleischner Society 2017; Radiology 2017; 284:228-243. 6.  Uterus absent. Electronically Signed   By: Lowella Grip III M.D.   On: 06/01/2019 13:13   DG Chest Port 1 View  Result Date: 06/01/2019 CLINICAL DATA:  Preoperative assessment.  Tobacco use. EXAM: PORTABLE CHEST 1 VIEW COMPARISON:  February 02, 2018 FINDINGS: Lungs are clear. Heart size and pulmonary vascularity are normal. No adenopathy. No pneumothorax. No bone lesions. IMPRESSION: Lungs clear.  Cardiac silhouette within normal limits. Electronically Signed   By: Lowella Grip III M.D.   On: 06/01/2019 14:11   DG Abd Portable 1 View  Result Date: 06/01/2019 CLINICAL DATA:  Bedside nasogastric tube placement. Current history of small-bowel obstruction as identified on CT earlier today, due to small bowel within an umbilical hernia. EXAM: PORTABLE ABDOMEN - 1 VIEW COMPARISON:  CT abdomen and pelvis earlier same day. FINDINGS: Nasogastric tube  looped in the stomach with its tip overlying the expected location of the fundus. Dilated gas-filled loops of small bowel centrally in the abdomen indicating small bowel obstruction as identified on the CT earlier today. Contrast material within a mildly distended LEFT renal collecting system and proximal and mid LEFT ureter which can be followed to the level of the upper pelvis where there is an apparent filling defect; a calculus was not present in the distal LEFT ureter on the earlier CT. Contrast is present within a normal appearing RIGHT renal collecting system and RIGHT ureter. Note is made of a large diverticulum arising from the superior bladder as noted on CT. IMPRESSION: 1. Nasogastric tube looped in the stomach with its tip overlying the expected location of the fundus. 2. Small bowel obstruction as demonstrated on CT earlier today. 3. Possible filling defect within the distal LEFT ureter, with mild LEFT hydroureteronephrosis. As there was no distal LEFT ureteral calculus on the CT earlier, a ureteral mass is not excluded. Does the patient have hematuria? One might consider urology consultation and retrograde urography once the patient's acute small-bowel obstruction has resolved. Electronically Signed   By: Evangeline Dakin M.D.   On: 06/01/2019 15:09    Anti-infectives: Anti-infectives (From admission, onward)   Start     Dose/Rate Route Frequency Ordered Stop   06/02/19 0600  ceFAZolin (ANCEF) IVPB 2g/100 mL premix     2 g 200 mL/hr over 30 Minutes Intravenous On  call to O.R. 06/01/19 1613 06/01/19 1756   06/02/19 0515  cefoTEtan (CEFOTAN) 2 g in sodium chloride 0.9 % 100 mL IVPB     2 g 200 mL/hr over 30 Minutes Intravenous Every 12 hours 06/02/19 0506 06/03/19 2159   06/02/19 0515  fluconazole (DIFLUCAN) IVPB 200 mg    Note to Pharmacy: Vaginal candidiasis but NPO with NG tube in place   200 mg 100 mL/hr over 60 Minutes Intravenous  Once 06/02/19 0506 06/02/19 [id number]       Assessment/Plan: Debra Miranda is a 74 yo POD 1 s/p small bowel resection, umbilical hernia repair, omentectomy for strangulated umbilical hernia. Doing well.  PRN for pain IS, OOB HD ok, monitor NPO, NG, can have sips and ice awaiting bowel function  LR @ 75, K and Mg replaced  Labs in AM No signs of bleeding, no leukocytosis Cefotetan post op for bowel ischemia, UA dirty, culture pending Yeast treated with 1 dose of diflucan Foley for now, remove tomorrow SCDs, heparin sq  Ambulate   Updated cousin, Ronald Pippins, about plans.    LOS: 1 day    Virl Cagey 06/02/2019

## 2019-06-02 NOTE — Progress Notes (Addendum)
Inpatient Diabetes Program Recommendations  AACE/ADA: New Consensus Statement on Inpatient Glycemic Control (2015)  Target Ranges:  Prepandial:   less than 140 mg/dL      Peak postprandial:   less than 180 mg/dL (1-2 hours)      Critically ill patients:  140 - 180 mg/dL   Lab Results  Component Value Date   GLUCAP 201 (H) 06/02/2019   HGBA1C 10.6 (H) 06/02/2019    Review of Glycemic Control Results for Debra Miranda, Debra Miranda (MRN [id number]) as of 06/02/2019 10:31  Ref. Range 06/01/2019 16:14 06/01/2019 17:15 06/01/2019 19:19 06/02/2019 05:13 06/02/2019 07:59  Glucose-Capillary Latest Ref Range: 70 - 99 mg/dL 230 (H) 192 (H) 200 (H) 264 (H) 201 (H)   Diabetes history: DM2 Outpatient Diabetes medications: Jardiance 25 mg qd + Januvia 100 mg + Metformin 500 mg bid Current orders for Inpatient glycemic control: Novolog moderate correction q 4 hrs.  Inpatient Diabetes Program Recommendations:   While patient off of oral medications: -Lantus 16 units daily (0.2 units/kg x 81.6 kg)  Thank you, Bethena Roys E. Annalyssa Thune, RN, MSN, CDE  Diabetes Coordinator Inpatient Glycemic Control Team Team Pager [phone] (8am-5pm) 06/02/2019 10:34 AM

## 2019-06-03 LAB — CBC WITH DIFFERENTIAL/PLATELET
Abs Immature Granulocytes: 0.02 10*3/uL (ref 0.00–0.07)
Basophils Absolute: 0 10*3/uL (ref 0.0–0.1)
Basophils Relative: 1 %
Eosinophils Absolute: 0 10*3/uL (ref 0.0–0.5)
Eosinophils Relative: 1 %
HCT: 40.5 % (ref 36.0–46.0)
Hemoglobin: 13.1 g/dL (ref 12.0–15.0)
Immature Granulocytes: 0 %
Lymphocytes Relative: 17 %
Lymphs Abs: 1.1 10*3/uL (ref 0.7–4.0)
MCH: 29.7 pg (ref 26.0–34.0)
MCHC: 32.3 g/dL (ref 30.0–36.0)
MCV: 91.8 fL (ref 80.0–100.0)
Monocytes Absolute: 0.6 10*3/uL (ref 0.1–1.0)
Monocytes Relative: 10 %
Neutro Abs: 4.8 10*3/uL (ref 1.7–7.7)
Neutrophils Relative %: 71 %
Platelets: 95 10*3/uL — ABNORMAL LOW (ref 150–400)
RBC: 4.41 MIL/uL (ref 3.87–5.11)
RDW: 13 % (ref 11.5–15.5)
WBC: 6.6 10*3/uL (ref 4.0–10.5)
nRBC: 0 % (ref 0.0–0.2)

## 2019-06-03 LAB — GLUCOSE, CAPILLARY
Glucose-Capillary: 112 mg/dL — ABNORMAL HIGH (ref 70–99)
Glucose-Capillary: 113 mg/dL — ABNORMAL HIGH (ref 70–99)
Glucose-Capillary: 114 mg/dL — ABNORMAL HIGH (ref 70–99)
Glucose-Capillary: 72 mg/dL (ref 70–99)
Glucose-Capillary: 77 mg/dL (ref 70–99)
Glucose-Capillary: 98 mg/dL (ref 70–99)

## 2019-06-03 LAB — URINE CULTURE: Culture: >=100000 — AB

## 2019-06-03 LAB — BASIC METABOLIC PANEL
Anion gap: 11 (ref 5–15)
BUN: 15 mg/dL (ref 8–23)
CO2: 26 mmol/L (ref 22–32)
Calcium: 8.5 mg/dL — ABNORMAL LOW (ref 8.9–10.3)
Chloride: 102 mmol/L (ref 98–111)
Creatinine, Ser: 0.48 mg/dL (ref 0.44–1.00)
GFR calc Af Amer: >60 mL/min (ref >60)
GFR calc non Af Amer: >60 mL/min (ref >60)
Glucose, Bld: 118 mg/dL — ABNORMAL HIGH (ref 70–99)
Potassium: 3.4 mmol/L — ABNORMAL LOW (ref 3.5–5.1)
Sodium: 139 mmol/L (ref 135–145)

## 2019-06-03 LAB — PHOSPHORUS: Phosphorus: 3.1 mg/dL (ref 2.5–4.6)

## 2019-06-03 LAB — MAGNESIUM: Magnesium: 2 mg/dL (ref 1.7–2.4)

## 2019-06-03 MED ORDER — SODIUM CHLORIDE 0.9 % IV SOLN
1.0000 g | INTRAVENOUS | Status: DC
  Administered 2019-06-03 – 2019-06-04 (×2): 1 g via INTRAVENOUS
  Filled 2019-06-03 (×2): qty 10

## 2019-06-03 MED ORDER — ENOXAPARIN SODIUM 40 MG/0.4ML ~~LOC~~ SOLN
40.0000 mg | SUBCUTANEOUS | Status: DC
  Administered 2019-06-03 – 2019-06-06 (×4): 40 mg via SUBCUTANEOUS
  Filled 2019-06-03 (×4): qty 0.4

## 2019-06-03 MED ORDER — ORAL CARE MOUTH RINSE
15.0000 mL | Freq: Two times a day (BID) | OROMUCOSAL | Status: DC
  Administered 2019-06-03 – 2019-06-06 (×6): 15 mL via OROMUCOSAL

## 2019-06-03 MED ORDER — POTASSIUM CHLORIDE 10 MEQ/100ML IV SOLN
10.0000 meq | INTRAVENOUS | Status: AC
  Administered 2019-06-03 (×3): 10 meq via INTRAVENOUS
  Filled 2019-06-03 (×2): qty 100

## 2019-06-03 MED ORDER — CHLORHEXIDINE GLUCONATE 0.12 % MT SOLN
15.0000 mL | Freq: Two times a day (BID) | OROMUCOSAL | Status: DC
  Administered 2019-06-03 – 2019-06-06 (×7): 15 mL via OROMUCOSAL
  Filled 2019-06-03 (×7): qty 15

## 2019-06-03 NOTE — Progress Notes (Signed)
[facility] Progress Note  2 Days Post-Op  Subjective: Reporting flatus. Feeling ok. Wanting to eat. Ambulated yesterday. Reported frequency of urine prior to admission.   Objective: Vital signs in last 24 hours: Temp:  [98 F (36.7 C)-98.9 F (37.2 C)] 98.7 F (37.1 C) (04/17 0535) Pulse Rate:  [75-94] 75 (04/17 0535) Resp:  [16-18] 16 (04/17 0535) BP: (126-157)/(52-87) 126/52 (04/17 0535) SpO2:  [90 %-94 %] 90 % (04/17 0824) Last BM Date: 05/31/19  Intake/Output from previous day: 04/16 0701 - 04/17 0700 In: 1029.5 [P.O.:240; I.V.:407.4; IV Piggyback:382.2] Out: [id number] [Urine:1050; Emesis/NG output:600] Intake/Output this shift: No intake/output data recorded.  General appearance: alert, cooperative and no distress Resp: normal work of breathing GI: soft, nondistended, appropriately tender, honeycomb c/d/i without erythema or drainage  Lab Results:  Recent Labs    06/02/19 0542 06/03/19 0654  WBC 7.8 6.6  HGB 14.8 13.1  HCT 45.5 40.5  PLT 107* 95*   BMET Recent Labs    06/02/19 1241 06/03/19 0654  NA 138 139  K 3.2* 3.4*  CL 102 102  CO2 25 26  GLUCOSE 105* 118*  BUN 15 15  CREATININE 0.43* 0.48  CALCIUM 8.4* 8.5*    Studies/Results: CT ABDOMEN PELVIS W CONTRAST  Result Date: 06/01/2019 CLINICAL DATA:  Abdominal pain with nausea.  Umbilical region hernia EXAM: CT ABDOMEN AND PELVIS WITH CONTRAST TECHNIQUE: Multidetector CT imaging of the abdomen and pelvis was performed using the standard protocol following bolus administration of intravenous contrast. CONTRAST:  135mL OMNIPAQUE IOHEXOL 300 MG/ML  SOLN COMPARISON:  September 01, 2017 FINDINGS: Lower chest: There is atelectatic change in the posterior and lateral right base regions. There is a 3 mm nodular opacity abutting the pleura in the posterior segment of the left lower lobe. There is a small foramen of Bochdalek hernia on the left posteriorly containing only fat. Hepatobiliary: There is  hepatic steatosis. No focal liver lesions are evident. Gallbladder wall is not appreciably thickened. There is no biliary duct dilatation. Pancreas: No pancreatic mass or inflammatory focus. Spleen: No splenic lesions are evident. Adrenals/Urinary Tract: Adrenals bilaterally appear normal. There is a cyst arising from the mid left kidney measuring 2.5 x 2.5 cm. There is no appreciable hydronephrosis on either side. No intrarenal calculus evident; note that contrast is seen in the collecting systems bilaterally which could mask small nonobstructing intrarenal calculi. There is no demonstrable ureteral calculus on either side. Urinary bladder is midline with wall thickness within normal limits. Stomach/Bowel: There is a sizable inguinal hernia. Small bowel extends into this hernia. There is a transition zone at the level of the umbilical hernia consistent with bowel obstruction at the level of the umbilical hernia. There appears to be a degree twisting of the small bowel within this hernia. There is no free air or portal venous air. The terminal ileum appears normal. Vascular/Lymphatic: No abdominal aortic aneurysm. There is aortic and iliac artery atherosclerosis. Major venous structures appear patent. There is no evident adenopathy in the abdomen or pelvis. Reproductive: Uterus absent.  No pelvic mass. Other: Appendix appears normal. No evident abscess or ascites in the abdomen or pelvis. The umbilical hernia that contains bowel with obstruction is mentioned above. This hernia also contains fat. The neck of this hernia measures 1.6 cm from superior to inferior dimension and 2.0 cm from right to left dimension. Musculoskeletal: Postoperative changes noted in the lower lumbar spine. There is degenerative type change in the lumbar spine and hip joints. No blastic  or lytic bone lesions. No intramuscular lesions are evident. Note that there is moderately severe spinal stenosis at L3-4 due to bony hypertrophy and diffuse  disc protrusion. Moderate stenosis is also noted at L4-5 due to similar factors. IMPRESSION: 1. There is an umbilical hernia which contains fat and a loop of small bowel. There is apparent twisting of bowel within this hernia with focal transition zone indicative of focal small-bowel obstruction at the level of this umbilical hernia. There is no intramural air or fluid surrounding this twisted bowel within the hernia. No free air. 2.  Appendix appears normal.  No abscess in the abdomen or pelvis. 3. Fairly severe spinal stenosis at L3-4 and to a slightly lesser extent at L4-5 due to disc protrusion and bony hypertrophy. 4.  Aortic Atherosclerosis (ICD10-I70.0). 5. 3 mm nodular opacity abutting the pleura in the posterior left base. No follow-up needed if patient is low-risk. Non-contrast chest CT can be considered in 12 months if patient is high-risk. This recommendation follows the consensus statement: Guidelines for Management of Incidental Pulmonary Nodules Detected on CT Images: From the Fleischner Society 2017; Radiology 2017; 284:228-243. 6.  Uterus absent. Electronically Signed   By: Lowella Grip III M.D.   On: 06/01/2019 13:13   DG Chest Port 1 View  Result Date: 06/01/2019 CLINICAL DATA:  Preoperative assessment.  Tobacco use. EXAM: PORTABLE CHEST 1 VIEW COMPARISON:  February 02, 2018 FINDINGS: Lungs are clear. Heart size and pulmonary vascularity are normal. No adenopathy. No pneumothorax. No bone lesions. IMPRESSION: Lungs clear.  Cardiac silhouette within normal limits. Electronically Signed   By: Lowella Grip III M.D.   On: 06/01/2019 14:11   DG Abd Portable 1 View  Result Date: 06/01/2019 CLINICAL DATA:  Bedside nasogastric tube placement. Current history of small-bowel obstruction as identified on CT earlier today, due to small bowel within an umbilical hernia. EXAM: PORTABLE ABDOMEN - 1 VIEW COMPARISON:  CT abdomen and pelvis earlier same day. FINDINGS: Nasogastric tube looped in  the stomach with its tip overlying the expected location of the fundus. Dilated gas-filled loops of small bowel centrally in the abdomen indicating small bowel obstruction as identified on the CT earlier today. Contrast material within a mildly distended LEFT renal collecting system and proximal and mid LEFT ureter which can be followed to the level of the upper pelvis where there is an apparent filling defect; a calculus was not present in the distal LEFT ureter on the earlier CT. Contrast is present within a normal appearing RIGHT renal collecting system and RIGHT ureter. Note is made of a large diverticulum arising from the superior bladder as noted on CT. IMPRESSION: 1. Nasogastric tube looped in the stomach with its tip overlying the expected location of the fundus. 2. Small bowel obstruction as demonstrated on CT earlier today. 3. Possible filling defect within the distal LEFT ureter, with mild LEFT hydroureteronephrosis. As there was no distal LEFT ureteral calculus on the CT earlier, a ureteral mass is not excluded. Does the patient have hematuria? One might consider urology consultation and retrograde urography once the patient's acute small-bowel obstruction has resolved. Electronically Signed   By: Evangeline Dakin M.D.   On: 06/01/2019 15:09    Anti-infectives: Anti-infectives (From admission, onward)   Start     Dose/Rate Route Frequency Ordered Stop   06/03/19 1030  cefTRIAXone (ROCEPHIN) 1 g in sodium chloride 0.9 % 100 mL IVPB     1 g 200 mL/hr over 30 Minutes Intravenous Every 24 hours  06/03/19 1021     06/02/19 0600  ceFAZolin (ANCEF) IVPB 2g/100 mL premix     2 g 200 mL/hr over 30 Minutes Intravenous On call to O.R. 06/01/19 1613 06/01/19 1756   06/02/19 0515  cefoTEtan (CEFOTAN) 2 g in sodium chloride 0.9 % 100 mL IVPB     2 g 200 mL/hr over 30 Minutes Intravenous Every 12 hours 06/02/19 0506 06/03/19 0906   06/02/19 0515  fluconazole (DIFLUCAN) IVPB 200 mg    Note to Pharmacy:  Vaginal candidiasis but NPO with NG tube in place   200 mg 100 mL/hr over 60 Minutes Intravenous  Once 06/02/19 0506 06/02/19 0657      Assessment/Plan: Debra Miranda is a 74 yo POD 2 s/p small bowel resection, umbilical hernia repair, omentectomy for strangulated umbilical hernia. Having flatus.   PRN for pain IS, OOB HD ok, monitor NPO, NG, can have sips and ice awaiting bowel function  LR @ 75, K replaced  Labs in AM No signs of bleeding, no leukocytosis Ceftriaxone for likely UTI with frequency, UA dirty, culture pending Yeast treated with 1 dose of diflucan Foley out, can use purewick if needed  SCDs, heparin sq switched to lovenox prophylaxis  Ambulate    LOS: 2 days    Virl Cagey 06/03/2019

## 2019-06-04 LAB — GLUCOSE, CAPILLARY
Glucose-Capillary: 102 mg/dL — ABNORMAL HIGH (ref 70–99)
Glucose-Capillary: 104 mg/dL — ABNORMAL HIGH (ref 70–99)
Glucose-Capillary: 133 mg/dL — ABNORMAL HIGH (ref 70–99)
Glucose-Capillary: 62 mg/dL — ABNORMAL LOW (ref 70–99)
Glucose-Capillary: 91 mg/dL (ref 70–99)
Glucose-Capillary: 98 mg/dL (ref 70–99)

## 2019-06-04 LAB — BASIC METABOLIC PANEL
Anion gap: 10 (ref 5–15)
BUN: 12 mg/dL (ref 8–23)
CO2: 25 mmol/L (ref 22–32)
Calcium: 8.3 mg/dL — ABNORMAL LOW (ref 8.9–10.3)
Chloride: 103 mmol/L (ref 98–111)
Creatinine, Ser: 0.42 mg/dL — ABNORMAL LOW (ref 0.44–1.00)
GFR calc Af Amer: >60 mL/min (ref >60)
GFR calc non Af Amer: >60 mL/min (ref >60)
Glucose, Bld: 99 mg/dL (ref 70–99)
Potassium: 3.1 mmol/L — ABNORMAL LOW (ref 3.5–5.1)
Sodium: 138 mmol/L (ref 135–145)

## 2019-06-04 LAB — MAGNESIUM: Magnesium: 1.9 mg/dL (ref 1.7–2.4)

## 2019-06-04 LAB — PHOSPHORUS: Phosphorus: 3.8 mg/dL (ref 2.5–4.6)

## 2019-06-04 MED ORDER — POTASSIUM CHLORIDE 10 MEQ/100ML IV SOLN
10.0000 meq | INTRAVENOUS | Status: AC
  Administered 2019-06-04 (×4): 10 meq via INTRAVENOUS
  Filled 2019-06-04 (×3): qty 100

## 2019-06-04 MED ORDER — INSULIN GLARGINE 100 UNIT/ML ~~LOC~~ SOLN
12.0000 [IU] | Freq: Every day | SUBCUTANEOUS | Status: DC
  Administered 2019-06-05 – 2019-06-06 (×2): 12 [IU] via SUBCUTANEOUS
  Filled 2019-06-04 (×3): qty 0.12

## 2019-06-04 MED ORDER — DEXTROSE 50 % IV SOLN
1.0000 | Freq: Once | INTRAVENOUS | Status: AC
  Administered 2019-06-04: 50 mL via INTRAVENOUS
  Filled 2019-06-04: qty 50

## 2019-06-04 MED ORDER — KCL IN DEXTROSE-NACL 40-5-0.45 MEQ/L-%-% IV SOLN: INTRAVENOUS | Status: DC

## 2019-06-04 MED ORDER — DEXTROSE-NACL 5-0.45 % IV SOLN: INTRAVENOUS | Status: DC

## 2019-06-04 MED ORDER — SODIUM CHLORIDE 0.9 % IV SOLN
1.0000 g | Freq: Once | INTRAVENOUS | Status: AC
  Administered 2019-06-04: 1 g via INTRAVENOUS
  Filled 2019-06-04: qty 10

## 2019-06-04 MED ORDER — SODIUM CHLORIDE 0.9 % IV SOLN
2.0000 g | INTRAVENOUS | Status: DC
  Administered 2019-06-05 – 2019-06-06 (×2): 2 g via INTRAVENOUS
  Filled 2019-06-04 (×2): qty 20

## 2019-06-04 NOTE — Progress Notes (Signed)
[facility] Progress Note  3 Days Post-Op  Subjective: Having flatus. Walking. No nausea NG output not high. Had some low BS last night, not really symptomatic. Started on D5IVF.   Objective: Vital signs in last 24 hours: Temp:  [97.7 F (36.5 C)-97.9 F (36.6 C)] 97.7 F (36.5 C) (04/18 0545) Pulse Rate:  [56-79] 56 (04/18 0545) Resp:  [20] 20 (04/18 0545) BP: (123-153)/(54-81) 123/54 (04/18 0545) SpO2:  [94 %-96 %] 96 % (04/18 0545) Last BM Date: 05/31/19  Intake/Output from previous day: 04/17 0701 - 04/18 0700 In: 1981.5 [I.V.:1615.3; IV Piggyback:366.2] Out: 450 [Urine:350; Emesis/NG output:100] Intake/Output this shift: No intake/output data recorded.  General appearance: alert, cooperative and no distress Resp: normal work of breathing GI: soft, nondistended, appropriately tender, abd binder in place, staples c/d/i with some minor bloody drainage, no erythema, some bruising  Lab Results:  Recent Labs    06/02/19 0542 06/03/19 0654  WBC 7.8 6.6  HGB 14.8 13.1  HCT 45.5 40.5  PLT 107* 95*   BMET Recent Labs    06/03/19 0654 06/04/19 0616  NA 139 138  K 3.4* 3.1*  CL 102 103  CO2 26 25  GLUCOSE 118* 99  BUN 15 12  CREATININE 0.48 0.42*  CALCIUM 8.5* 8.3*    Anti-infectives: Anti-infectives (From admission, onward)   Start     Dose/Rate Route Frequency Ordered Stop   06/03/19 1030  cefTRIAXone (ROCEPHIN) 1 g in sodium chloride 0.9 % 100 mL IVPB     1 g 200 mL/hr over 30 Minutes Intravenous Every 24 hours 06/03/19 1021     06/02/19 0600  ceFAZolin (ANCEF) IVPB 2g/100 mL premix     2 g 200 mL/hr over 30 Minutes Intravenous On call to O.R. 06/01/19 1613 06/01/19 1756   06/02/19 0515  cefoTEtan (CEFOTAN) 2 g in sodium chloride 0.9 % 100 mL IVPB     2 g 200 mL/hr over 30 Minutes Intravenous Every 12 hours 06/02/19 0506 06/03/19 0906   06/02/19 0515  fluconazole (DIFLUCAN) IVPB 200 mg    Note to Pharmacy: Vaginal candidiasis but NPO  with NG tube in place   200 mg 100 mL/hr over 60 Minutes Intravenous  Once 06/02/19 0506 06/02/19 0657      Assessment/Plan: Debra Miranda is a 74 yo POD 3 s/p small bowel resection, umbilical hernia repair, omentectomy for strangulated umbilical hernia. Having flatus.   PRN for pain IS, OOB HD ok NPO, NG, to gravity, if has BM can remove and give clears  D51/2 NS + 40Kcl and K replaced Labs in AM Ceftriaxone for UTI, growing lactobacillus and seem to have frequency symptoms so seems real, discussed with pharmacy and they recommend to continue and to do 2g which has been increased now, will plan for 7 day given odd bactermia and switch to keflex  Yeast treated with 1 dose of diflucan Foley out, can use purewick if needed  SCDs, lovenox prophylaxis  Ambulate  Can shower, dry dressing as needed to wound  KUB in AM to assess    LOS: 3 days    Debra Miranda 06/04/2019

## 2019-06-05 ENCOUNTER — Inpatient Hospital Stay (HOSPITAL_COMMUNITY): Payer: Medicare PPO

## 2019-06-05 LAB — PHOSPHORUS: Phosphorus: 3.4 mg/dL (ref 2.5–4.6)

## 2019-06-05 LAB — BASIC METABOLIC PANEL
Anion gap: 8 (ref 5–15)
BUN: 7 mg/dL — ABNORMAL LOW (ref 8–23)
CO2: 23 mmol/L (ref 22–32)
Calcium: 8.3 mg/dL — ABNORMAL LOW (ref 8.9–10.3)
Chloride: 107 mmol/L (ref 98–111)
Creatinine, Ser: 0.35 mg/dL — ABNORMAL LOW (ref 0.44–1.00)
GFR calc Af Amer: >60 mL/min (ref >60)
GFR calc non Af Amer: >60 mL/min (ref >60)
Glucose, Bld: 139 mg/dL — ABNORMAL HIGH (ref 70–99)
Potassium: 4 mmol/L (ref 3.5–5.1)
Sodium: 138 mmol/L (ref 135–145)

## 2019-06-05 LAB — GLUCOSE, CAPILLARY
Glucose-Capillary: 119 mg/dL — ABNORMAL HIGH (ref 70–99)
Glucose-Capillary: 126 mg/dL — ABNORMAL HIGH (ref 70–99)
Glucose-Capillary: 145 mg/dL — ABNORMAL HIGH (ref 70–99)
Glucose-Capillary: 168 mg/dL — ABNORMAL HIGH (ref 70–99)
Glucose-Capillary: 169 mg/dL — ABNORMAL HIGH (ref 70–99)
Glucose-Capillary: 173 mg/dL — ABNORMAL HIGH (ref 70–99)
Glucose-Capillary: 176 mg/dL — ABNORMAL HIGH (ref 70–99)

## 2019-06-05 LAB — SURGICAL PATHOLOGY

## 2019-06-05 LAB — MAGNESIUM: Magnesium: 1.8 mg/dL (ref 1.7–2.4)

## 2019-06-05 MED ORDER — MORPHINE SULFATE (PF) 2 MG/ML IV SOLN: 2.0000 mg | INTRAVENOUS | Status: DC | PRN

## 2019-06-05 MED ORDER — MENTHOL 3 MG MT LOZG
1.0000 | LOZENGE | OROMUCOSAL | Status: DC | PRN
  Administered 2019-06-05: 3 mg via ORAL
  Filled 2019-06-05: qty 9

## 2019-06-05 MED ORDER — OXYCODONE HCL 5 MG PO TABS
5.0000 mg | ORAL_TABLET | ORAL | Status: DC | PRN
  Filled 2019-06-05: qty 1

## 2019-06-05 MED ORDER — INSULIN ASPART 100 UNIT/ML ~~LOC~~ SOLN
0.0000 [IU] | Freq: Three times a day (TID) | SUBCUTANEOUS | Status: DC
  Administered 2019-06-05: 2 [IU] via SUBCUTANEOUS
  Administered 2019-06-05: 12:00:00 1 [IU] via SUBCUTANEOUS
  Administered 2019-06-06: 12:00:00 3 [IU] via SUBCUTANEOUS
  Administered 2019-06-06: 2 [IU] via SUBCUTANEOUS

## 2019-06-05 NOTE — Progress Notes (Signed)
[facility] Progress Note  4 Days Post-Op  Subjective: Having BMs and tolerating clears. Feeling good.   Objective: Vital signs in last 24 hours: Temp:  [98 F (36.7 C)-98.5 F (36.9 C)] 98.1 F (36.7 C) (04/19 0847) Pulse Rate:  [51-59] 59 (04/19 0847) Resp:  [16-19] 19 (04/19 0847) BP: (144-153)/(62-67) 153/66 (04/19 0847) SpO2:  [94 %-98 %] 98 % (04/19 0847) Last BM Date: 06/05/19  Intake/Output from previous day: 04/18 0701 - 04/19 0700 In: 534.3 [I.V.:186; IV Piggyback:348.3] Out: 1100 [Urine:900; Emesis/NG output:200] Intake/Output this shift: Total I/O In: -  Out: 700 [Urine:700]  General appearance: alert, cooperative and no distress Resp: normal work of breathing GI: soft, nondistended, appropriately tender, bruising at staples, no erythema or drainage, binder in place  Lab Results:  Recent Labs    06/03/19 0654  WBC 6.6  HGB 13.1  HCT 40.5  PLT 95*   BMET Recent Labs    06/04/19 0616 06/05/19 0514  NA 138 138  K 3.1* 4.0  CL 103 107  CO2 25 23  GLUCOSE 99 139*  BUN 12 7*  CREATININE 0.42* 0.35*  CALCIUM 8.3* 8.3*   PT/INR No results for input(s): LABPROT, INR in the last 72 hours.  Personally reviewed- normal bowel gas pattern  Studies/Results: DG Abd 1 View  Result Date: 06/05/2019 CLINICAL DATA:  74 year old female postop day 3 status post partial small bowel resection with primary anastomosis, umbilical hernia repair. EXAM: ABDOMEN - 1 VIEW COMPARISON:  Portable abdomen 06/01/2019 and earlier. FINDINGS: 2 portable AP supine views at 0435 hours. Enteric tube side hole remains visible in the left upper quadrant, probably at the gastric fundus. Improving bowel gas pattern, nonobstructed now. There are mid to left abdominal skin staples. No acute osseous abnormality identified. Previous lumbar fusion. No pneumoperitoneum is evident on these supine views. IMPRESSION: 1. Enteric tube side hole remains within the stomach. 2.  Improved, non-obstructed bowel gas pattern. Electronically Signed   By: Genevie Ann M.D.   On: 06/05/2019 06:33    Anti-infectives: Anti-infectives (From admission, onward)   Start     Dose/Rate Route Frequency Ordered Stop   06/05/19 1030  cefTRIAXone (ROCEPHIN) 2 g in sodium chloride 0.9 % 100 mL IVPB     2 g 200 mL/hr over 30 Minutes Intravenous Every 24 hours 06/04/19 1221     06/04/19 1230  cefTRIAXone (ROCEPHIN) 1 g in sodium chloride 0.9 % 100 mL IVPB     1 g 200 mL/hr over 30 Minutes Intravenous  Once 06/04/19 1223 06/04/19 1317   06/03/19 1030  cefTRIAXone (ROCEPHIN) 1 g in sodium chloride 0.9 % 100 mL IVPB  Status:  Discontinued     1 g 200 mL/hr over 30 Minutes Intravenous Every 24 hours 06/03/19 1021 06/04/19 1221   06/02/19 0600  ceFAZolin (ANCEF) IVPB 2g/100 mL premix     2 g 200 mL/hr over 30 Minutes Intravenous On call to O.R. 06/01/19 1613 06/01/19 1756   06/02/19 0515  cefoTEtan (CEFOTAN) 2 g in sodium chloride 0.9 % 100 mL IVPB     2 g 200 mL/hr over 30 Minutes Intravenous Every 12 hours 06/02/19 0506 06/03/19 0906   06/02/19 0515  fluconazole (DIFLUCAN) IVPB 200 mg    Note to Pharmacy: Vaginal candidiasis but NPO with NG tube in place   200 mg 100 mL/hr over 60 Minutes Intravenous  Once 06/02/19 0506 06/02/19 0657      Assessment/Plan: Debra Miranda is a 74 yo POD4  s/p small bowel resection, umbilical hernia repair, omentectomy for strangulated umbilical hernia.  PRN for pain added roxicodone  IS, OOB HD ok Clears and adv to fulls and then to soft as tolerated Lantus at 12 given prior low BS, changing the insulin SSI to sensitive per diabetes recs  Ceftriaxone for UTI, growing lactobacillus, has received 2/7 days for treatment  Yeast treated with 1 dose of diflucan SCDs, lovenox prophylaxis Ambulate Can shower, dry dressing as needed to wound    LOS: 4 days    Virl Cagey 06/05/2019

## 2019-06-05 NOTE — Progress Notes (Signed)
Inpatient Diabetes Program Recommendations  AACE/ADA: New Consensus Statement on Inpatient Glycemic Control (2015)  Target Ranges:  Prepandial:   less than 140 mg/dL      Peak postprandial:   less than 180 mg/dL (1-2 hours)      Critically ill patients:  140 - 180 mg/dL   Lab Results  Component Value Date   GLUCAP 176 (H) 06/05/2019   HGBA1C 10.6 (H) 06/02/2019    Review of Glycemic Control  Diabetes history: DM2 Outpatient Diabetes medications: Jardiance 25 mg qd + Januvia 100 mg + Metformin 500 mg bid Current orders for Inpatient glycemic control: Lantus 12 units + Novolog moderate correction q 4 hrs.  Inpatient Diabetes Program Recommendations:   -Decrease Novolog correction to sensitive  Thank you, Nani Gasser. Otisha Spickler, RN, MSN, CDE  Diabetes Coordinator Inpatient Glycemic Control Team Team Pager [phone] (8am-5pm) 06/05/2019 8:55 AM

## 2019-06-05 NOTE — Care Management Important Message (Signed)
Important Message  Patient Details  Name: Debra Miranda MRN: [id number] Date of Birth: April 03, 1945   Medicare Important Message Given:  Yes     [name] 06/05/2019, 12:09 PM

## 2019-06-06 ENCOUNTER — Telehealth: Payer: Self-pay | Admitting: Family Medicine

## 2019-06-06 LAB — GLUCOSE, CAPILLARY
Glucose-Capillary: 165 mg/dL — ABNORMAL HIGH (ref 70–99)
Glucose-Capillary: 217 mg/dL — ABNORMAL HIGH (ref 70–99)

## 2019-06-06 MED ORDER — CEPHALEXIN 250 MG PO CAPS: 250.0000 mg | ORAL_CAPSULE | Freq: Four times a day (QID) | ORAL | 0 refills | Status: AC

## 2019-06-06 MED ORDER — PANTOPRAZOLE SODIUM 40 MG PO TBEC: 40.0000 mg | DELAYED_RELEASE_TABLET | Freq: Every day | ORAL | Status: DC

## 2019-06-06 NOTE — Discharge Instructions (Signed)
Discharge Open Abdominal Surgery Instructions:  Common Complaints: Pain at the incision site is common. This will improve with time. Take your pain medications as described below. Some nausea is common and poor appetite. The main goal is to stay hydrated the first few days after surgery.   Diet/ Activity: Diet as tolerated. You have started and tolerated a diet in the hospital, and should continue to increase what you are able to eat.   You may not have a large appetite, but it is important to stay hydrated. Drink 64 ounces of water a day. Your appetite will return with time.  Keep a dry dressing in place over your staples daily or as needed. Some minor pink/ blood tinged drainage is expected. This will stop in a few days after surgery.  Shower per your regular routine daily.  Do not take hot showers. Take warm showers that are less than 10 minutes.  Pat the incision dry. Wear an abdominal binder daily with activity. You do not have to wear this while sleeping or sitting.  Rest and listen to your body, but do not remain in bed all day.  Walk everyday for at least 15-20 minutes. Deep cough and move around every 1-2 hours in the first few days after surgery.  Do not lift > 10 lbs, perform excessive bending, pushing, pulling, squatting for 6-8 weeks after surgery.  The activity restrictions and the abdominal binder are to prevent hernia formation at your incision while you are healing.  Do not place lotions or balms on your incision unless instructed to specifically by Dr. Constance Haw.  Start back your diabetes medications as you are eating.   Pain Expectations and Narcotics: -After surgery you will have pain associated with your incisions and this is normal. The pain is muscular and nerve pain, and will get better with time. -You are encouraged and expected to take non narcotic medications like tylenol and ibuprofen (when able) to treat pain as multiple modalities can aid with pain  treatment. -Narcotics are only used when pain is severe or there is breakthrough pain. You requested me to not prescribe any for now.  -You are not expected to have a pain score of 0 after surgery, as we cannot prevent pain. A pain score of 3-4 that allows you to be functional, move, walk, and tolerate some activity is the goal. The pain will continue to improve over the days after surgery and is dependent on your surgery. -Due to [place] law, we are only able to give a certain amount of pain medication to treat post operative pain, and we only give additional narcotics on a patient by patient basis.  -For most laparoscopic surgery, studies have shown that the majority of patients only need 10-15 narcotic pills, and for open surgeries most patients only need 15-20.   -Having appropriate expectations of pain and knowledge of pain management with non narcotics is important as we do not want anyone to become addicted to narcotic pain medication.  -Using ice packs in the first 48 hours and heating pads after 48 hours, wearing an abdominal binder (when recommended), and using over the counter medications are all ways to help with pain management.   -Simple acts like meditation and mindfulness practices after surgery can also help with pain control and research has proven the benefit of these practices.  Medication: Take tylenol and ibuprofen as needed for pain control, alternating every 4-6 hours.  Example:  Tylenol 1000mg  @ 6am, 12noon, 6pm, 41midnight (Do not  exceed 4000mg  of tylenol a day). Ibuprofen 800mg  @ 9am, 3pm, 9pm, 3am (Do not exceed 3600mg  of ibuprofen a day).  Call if you need anything stronger for pain. You requested to not get any narcotic at this time.  Take Colace for constipation related to narcotic pain medication. If you do not have a bowel movement in 2 days, take Miralax over the counter.  Drink plenty of water to also prevent constipation.   Contact Information: If you have questions  or concerns, please call our office, [phone], Monday- Thursday 8AM-5PM and Friday 8AM-12Noon.  If it is after hours or on the weekend, please call [facility] Main Number, [phone], and ask to speak to the surgeon on call for Dr. Constance Haw at [facility].    Open Small Bowel Resection, Care After This sheet gives you information about how to care for yourself after your procedure. Your health care provider may also give you more specific instructions. If you have problems or questions, contact your health care provider. What can I expect after the procedure? After the procedure, it is common to have:  Pain in your abdomen, especially along your incision. You will be given pain medicines to control this.  Tiredness. This is a normal part of the recovery process. Your energy level will return to normal over the next several weeks.  Constipation. You may be given a stool softener to help prevent this. Follow these instructions at home: Medicines  Take over-the-counter and prescription medicines only as told by your health care provider.  Ask your health care provider if the medicine prescribed to you requires you to avoid driving or using heavy machinery.  If you were prescribed an antibiotic medicine, use it as told by your health care provider. Do not stop using the antibiotic even if you start to feel better. Activity  Return to your normal activities as told by your health care provider. Ask your health care provider what activities are safe for you.  Do not lift anything that is heavier than 10 lb (4.5 kg), or the limit that you are told, until your health care provider says that it is safe.  Rest as told by your health care provider.  Avoid sitting for a long time without moving. Get up to take short walks every 1-2 hours. This is important to improve blood flow and breathing. Ask for help if you feel weak or unsteady.  Avoid activities that take a lot of effort for as long as  told by your health care provider. Incision care   Follow instructions from your health care provider about how to take care of your incision. Make sure you: ? Wash your hands with soap and water before and after you change your bandage (dressing). If soap and water are not available, use hand sanitizer. ? Change your dressing as told by your health care provider. ? Leave stitches (sutures), skin glue, or adhesive strips in place. These skin closures may need to stay in place for 2 weeks or longer. If adhesive strip edges start to loosen and curl up, you may trim the loose edges. Do not remove adhesive strips completely unless your health care provider tells you to do that.  Check your incision area every day for signs of infection. Check for: ? Redness, swelling, or pain. ? Fluid or blood. ? Warmth. ? Pus or a bad smell. Do not take baths, swim, or use a hot tub until your health care provider approves. You may shower.  Managing  constipation You may need to take these actions to prevent or treat constipation:  Drink enough fluid to keep your urine pale yellow.  Take over-the-counter or prescription medicines.  Eat foods that are high in fiber, such as beans, whole grains, and fresh fruits and vegetables.  Limit foods that are high in fat and processed sugars, such as fried or sweet foods.  General instructions  Continue to practice deep breathing and coughing. If it hurts to cough, try holding a pillow against your abdomen as you cough.  If you were given a breathing device (incentive spirometer), continue to use it as told by your health care provider.  Wear compression stockings as told by your health care provider. These stockings help to prevent blood clots and reduce swelling in your legs.  Do not use any products that contain nicotine or tobacco, such as cigarettes, e-cigarettes, and chewing tobacco. These can delay incision healing after surgery. If you need help quitting,  ask your health care provider.  Keep all follow-up visits as told by your health care provider. This is important. Contact a health care provider if:  You have pain that is not relieved with medicine.  You do not feel like eating.  You feel nauseous or you vomit.  You have constipation that is not relieved with prescribed stool softeners.  You have a fever.  You have any of these signs of infection: ? Redness, swelling, or pain around your incision. ? Fluid or blood coming from your incision. ? Warmth coming from your incision. ? Pus or a bad smell coming from your incision.  Your incision breaks open.  You have bleeding from the rectum.  You have swelling of your abdomen. Get help right away if:  Your pain gets worse, even after you take pain medicine.  Your legs or arms become painful, red, or swollen.  You have chest pain.  You have trouble breathing.  You cannot have a bowel movement or pass gas.  You have persistent nausea or vomiting.  You have increased abdominal pain. These symptoms may represent a serious problem that is an emergency. Do not wait to see if the symptoms will go away. Get medical help right away. Call your local emergency services (911 in the [place] Do not drive yourself to the hospital. Summary  After the procedure, it is common to have pain in your abdomen and incision areas, tiredness, and constipation.  Take over-the-counter and prescription medicines only as told by your health care provider.  Return to your normal activities as told by your health care provider. Ask your health care provider what activities are safe for you.  Follow instructions from your health care provider about how to take care of your incision area.  Contact a health care provider if you have pain that is not relieved with medicine or if you have any signs of infection. This information is not intended to replace advice given to you by your health care provider.  Make sure you discuss any questions you have with your health care provider. Document Revised: 07/07/2018 Document Reviewed: 07/07/2018 Elsevier Patient Education  [place].

## 2019-06-06 NOTE — Discharge Summary (Signed)
Physician Discharge Summary  Patient ID: Debra Miranda MRN: [id number] DOB/AGE: August 01, 1945 74 y.o.  Admit date: 06/01/2019 Discharge date: 06/06/2019  Admission Diagnoses: Incarcerated umbilical hernia   Discharge Diagnoses:  Principal Problem:   Strangulated umbilical hernia Active Problems:   Status post umbilical hernia repair, follow-up exam   Incarcerated umbilical hernia   Ischemic necrosis of small bowel [facility])   Discharged Condition: good  Hospital Course: [name] is a very sweet 74 yo who had a chronic umbilical hernia and came in with 3-4 day history of pain and swelling and a hardened hernia. She had been having pain and nausea/vomiting. Her hernia was not able to be reduced by the ED, and given the timing and obstructive symptoms, she was taken to the OR for exploration and repair. She did have ischemic bowel, and this was resected and primary repaired. She did well post operatively. She did have some issues with blood sugar control and this was adjusted with SSI and lantus while she was NPO.  She had a A1c done post op that was 10.6.  She had some degree of ileus after surgery but started to have bowel function and the NG tube was removed. She was ambulating and tolerating a diet and having Bms prior to discharge.  She had been eating sufficiently to start back on her home regimen, and was told to follow up with her PCP for diabetes as her diabetes is poorly controlled and will effect healing.   During her admission a foley was placed and she had cloudy urine and signs of a yeast infection. She was treated for the yeast with 200 mg diflucan, and the UTI demonstrated lactobacillus > 100000 colonies. She was tread with cephalosporin for this and sent out to complete her course. She had been saying she had frequency issues.   Consults: None  Significant Diagnostic Studies: CT- incarcerated hernia with small bowel   Treatments: IV hydration, antibiotics: Rocephin- UTI and  surgery: Small bowel resection and primary anastomosis, umbilical hernia repair   Discharge Exam: Blood pressure 136/62, pulse 64, temperature 97.9 F (36.6 C), temperature source Oral, resp. rate 18, height 5\' 7"  (1.702 m), weight 81.6 kg, SpO2 94 %. General appearance: alert, cooperative and no distress Resp: normal work of breathing GI: soft, nondistended, appropriately tender, staples c.d.i with some scabbing of the  thinned skin of the belly button that is now an inny, no erythema or drainage, some bruising Extremities: extremities normal, atraumatic, no cyanosis or edema  Disposition: Discharge disposition: 01-Home or Self Care       Discharge Instructions    Call MD for:  difficulty breathing, headache or visual disturbances   Complete by: As directed    Call MD for:  extreme fatigue   Complete by: As directed    Call MD for:  persistant dizziness or light-headedness   Complete by: As directed    Call MD for:  persistant nausea and vomiting   Complete by: As directed    Call MD for:  redness, tenderness, or signs of infection (pain, swelling, redness, odor or green/yellow discharge around incision site)   Complete by: As directed    Call MD for:  severe uncontrolled pain   Complete by: As directed    Call MD for:  temperature >100.4   Complete by: As directed    Increase activity slowly   Complete by: As directed      Allergies as of 06/06/2019      Reactions  Latex Itching      Medication List    TAKE these medications   atorvastatin 20 MG tablet Commonly known as: LIPITOR Take 1 tablet (20 mg total) by mouth daily.   AZO BLADDER CONTROL/GO-LESS PO Take by mouth.   CALCIUM 1200 PO Take 1 tablet by mouth at bedtime.   cephALEXin 250 MG capsule Commonly known as: KEFLEX Take 1 capsule (250 mg total) by mouth 4 (four) times daily for 7 days.   cetirizine 10 MG tablet Commonly known as: ZYRTEC Take 10 mg by mouth daily.   diclofenac Sodium 1 %  Gel Commonly known as: Voltaren Apply 2-4 g topically 4 (four) times daily. If not covered by ins, please help her find the OTC version   escitalopram 10 MG tablet Commonly known as: LEXAPRO Take 1 tablet (10 mg total) by mouth daily.   Iron 90 (18 Fe) MG Tabs Take by mouth.   Jardiance 25 MG Tabs tablet Generic drug: empagliflozin Take 25 mg by mouth daily before breakfast.   metFORMIN 500 MG tablet Commonly known as: GLUCOPHAGE Take 1 tablet (500 mg total) by mouth 2 (two) times daily with a meal. (patient prefers IR over XR. Please cancel previous rx)   nystatin powder Commonly known as: MYCOSTATIN/NYSTOP Apply topically 2 (two) times daily.   One-A-Day VitaCraves Chew Chew by mouth.   Probiotic 1-250 BILLION-MG Caps Take by mouth.   sitaGLIPtin 100 MG tablet Commonly known as: JANUVIA Take 100 mg by mouth at bedtime.   Vitamin D 50 MCG (2000 UT) Caps Take 2,000 Units by mouth at bedtime.      Follow-up Information    Virl Cagey, MD Follow up on 06/13/2019.   Specialty: General Surgery Why: staple removal; the office will call with a time  Contact information: [address] Linna Hoff [facility] [address] [place], [place], DO Follow up in 1 week(s).   Specialty: Family Medicine Why: next 1-2 weeks, get an appt to follow up; diabetes check  Contact information: [place] [address] [phone]           Signed: Virl Cagey 06/06/2019, 1:44 PM

## 2019-06-06 NOTE — Telephone Encounter (Addendum)
    Transitional Care Management  Contact Attempt Attempt Date:06/06/2019 Attempted By: Brynda Peon CMA  1st unsuccessful TCM contact attempt.   I reached out to Michel Bickers on her preferred telephone number to discuss Transitional Care Management, medication reconciliation, and to schedule a TCM hospital follow-up with her PCP at [facility].  Discharge Date: 06/06/2019 Location: [facility] Discharge Dx: umbilical hernia repair   Recommendations for Outpatient Follow-up:   waiting for note    Plan I left a HIPPA compliant message for her to return my call.  Will attempt to contact again within the 2 business day post discharge window if she does not return my call.

## 2019-06-07 NOTE — Telephone Encounter (Signed)
    Transitional Care Management  Contact Attempt Attempt Date:06/07/2019 Attempted By: Zannie Cove, LPN    2nd unsuccessful TCM contact attempt.   I reached out to Debra Miranda on her preferred telephone number to discuss Transitional Care Management, medication reconciliation, and to schedule a TCM hospital follow-up with her PCP at [facility].  Discharge Date: 06/06/19 Location: [facility]  Discharge Dx: umbilical hernia repair  Recommendations for Outpatient Follow-up:   (insert from discharge summary) Follow-up Information    Virl Cagey, MD Follow up on 06/13/2019.   Specialty: General Surgery Why: staple removal; the office will call with a time  Contact information: [address] Linna Hoff [facility] [address] [place], [place], DO Follow up in 1 week(s).   Specialty: Family Medicine Why: next 1-2 weeks, get an appt to follow up; diabetes check  Contact information: [place] [address] [phone]     Plan I left a HIPPA compliant message for her to return my call.  Will attempt to contact again within the 2 business day post discharge window if she does not return my call.

## 2019-06-07 NOTE — Telephone Encounter (Signed)
TRANSITIONAL CARE MANAGEMENT TELEPHONE OUTREACH NOTE   Contact Date: 06/07/2019 Contacted By: Zannie Cove, LPN     DISCHARGE INFORMATION Date of Discharge:06/06/19 Discharge Facility: [facility] Discharge Diagnosis:Umbilical Hernia repair   Outpatient Follow Up Recommendations (copied from discharge summary) Virl Cagey, MD Follow up on 06/13/2019.   Specialty: General Surgery Why: staple removal; the office will call with a time  Contact information: [address] Linna Hoff [facility] [address] [place], [place], DO Follow up in 1 week(s).   Specialty: Family Medicine Why: next 1-2 weeks, get an appt to follow up; diabetes check  Contact information: [place] [address] [phone]     Debra Miranda is a female primary care patient of [name], DO. An outgoing telephone call was made today and I spoke with Patient.  [name] condition(s) and treatment(s) were discussed. An opportunity to ask questions was provided and all were answered or forwarded as appropriate.    ACTIVITIES OF DAILY LIVING  Debra Miranda lives with an adult companion and she can perform ADLs independently. her primary caregiver is herself. she is able to depend on her primary caregiver(s) for consistent help. Transportation to appointments, to pick up medications, and to run errands is not a problem.  (Consider referral to [place] if transportation or a consistent caregiver is a problem)   Fall Risk Fall Risk  03/31/2019 12/27/2018  Falls in the past year? 0 0  Number falls in past yr: - -  Injury with Fall? - -  Risk for fall due to : - -  Risk for fall due to: Comment - -  Follow up - -  Comment - -    medium [place] Modifications/Assistive Devices Wheelchair: No Cane: Yes Ramp: No Bedside Toilet: No Hospital Bed:  No Other:    [place] she is not receiving home health n/a services.      MEDICATION RECONCILIATION  Debra Miranda has been able to pick-up all prescribed discharge medications from the pharmacy.   A post discharge medication reconciliation was performed and the complete medication list was reviewed with the patient/caregiver and is current as of 06/07/2019. Changes highlighted below.  Discontinued Medications NONE  Current Medication List Allergies as of 06/06/2019      Reactions   Latex Itching      Medication List       Accurate as of June 06, 2019 11:59 PM. If you have any questions, ask your nurse or doctor.        atorvastatin 20 MG tablet Commonly known as: LIPITOR Take 1 tablet (20 mg total) by mouth daily.   AZO BLADDER CONTROL/GO-LESS PO Take by mouth.   CALCIUM 1200 PO Take 1 tablet by mouth at bedtime.   cephALEXin 250 MG capsule Commonly known as: KEFLEX Take 1 capsule (250 mg total) by mouth 4 (four) times daily for 7 days.   cetirizine 10 MG tablet Commonly known as: ZYRTEC Take 10 mg by mouth daily.   diclofenac Sodium 1 % Gel Commonly known as: Voltaren Apply 2-4 g topically 4 (four) times daily. If not covered by ins, please help her find the OTC version   escitalopram 10 MG tablet Commonly known as: LEXAPRO Take 1 tablet (10 mg total) by mouth daily.   Iron 90 (18 Fe) MG Tabs Take by mouth.   Jardiance 25 MG Tabs tablet  Generic drug: empagliflozin Take 25 mg by mouth daily before breakfast.   metFORMIN 500 MG tablet Commonly known as: GLUCOPHAGE Take 1 tablet (500 mg total) by mouth 2 (two) times daily with a meal. (patient prefers IR over XR. Please cancel previous rx)   nystatin powder Commonly known as: MYCOSTATIN/NYSTOP Apply topically 2 (two) times daily.   One-A-Day VitaCraves Chew Chew by mouth.   Probiotic 1-250 BILLION-MG Caps Take by mouth.   sitaGLIPtin 100 MG tablet Commonly known as: JANUVIA Take 100 mg by mouth at bedtime.   Vitamin D 50 MCG (2000 UT) Caps Take 2,000 Units by  mouth at bedtime.        PATIENT EDUCATION & FOLLOW-UP PLAN  An appointment for Transitional Care Management is scheduled with [name], DO on 06/15/19 at 200pm .  Take all medications as prescribed  Contact our office by calling [phone] if you have any questions or concerns

## 2019-06-13 ENCOUNTER — Encounter: Payer: Self-pay | Admitting: General Surgery

## 2019-06-13 ENCOUNTER — Other Ambulatory Visit: Payer: Self-pay

## 2019-06-13 ENCOUNTER — Ambulatory Visit (INDEPENDENT_AMBULATORY_CARE_PROVIDER_SITE_OTHER): Payer: Self-pay | Admitting: General Surgery

## 2019-06-13 VITALS — BP 140/79 | HR 78 | Temp 98.0°F | Resp 12 | Ht 67.5 in | Wt 180.0 lb

## 2019-06-13 DIAGNOSIS — K420 Umbilical hernia with obstruction, without gangrene: Secondary | ICD-10-CM

## 2019-06-13 NOTE — Patient Instructions (Signed)
No lifting >10 lbs for the next 6-8 week after surgery. Diet as tolerated.

## 2019-06-14 NOTE — Progress Notes (Signed)
[facility] Note   HPI:  74 y.o. Female presents to clinic for post-op follow-up evaluation after umbilical hernia repair and small bowel resection for strangulated small bowel. She is doing very well.   Review of Systems:  No fever or chills No drainage All other review of systems: otherwise negative   Pathology: FINAL MICROSCOPIC DIAGNOSIS:   A. SMALL BOWEL AND OMENTUM, RESECTION:  - Hernia sac, clinically incarcerated. Small bowel. Omentum. No  malignancy identified.   Vital Signs:  BP 140/79   Pulse 78   Temp 98 F (36.7 C) (Oral)   Resp 12   Ht 5' 7.5" (1.715 m)   Wt 180 lb (81.6 kg)   SpO2 96%   BMI 27.78 kg/m    Physical Exam:  Physical Exam Vitals reviewed.  HENT:     Head: Normocephalic.  Cardiovascular:     Rate and Rhythm: Normal rate.  Pulmonary:     Effort: Pulmonary effort is normal.  Abdominal:     General: There is no distension.     Palpations: Abdomen is soft.     Tenderness: There is no abdominal tenderness.     Comments: Midline healing, staple line with redness at the staple insertion site but not extending, more reaction, bruising of the umbilical skin, staples removed and steri strips placed  Musculoskeletal:        General: No swelling.  Neurological:     Mental Status: She is alert.      Assessment:  74 y.o. yo Female s/p umbilical hernia repair and small bowel resection for strangulated bowel. She is doing well and having Bms and eating well. No issues reported.   Plan:  No lifting >10 lbs for the next 6-8 week after surgery. Diet as tolerated. PRN follow up  All of the above recommendations were discussed with the patient, and all of patient's questions were answered to her expressed satisfaction.  Curlene Labrum, MD [facility] [address] [place], [place] [address] [phone] (office)

## 2019-06-15 ENCOUNTER — Encounter: Payer: Self-pay | Admitting: Family Medicine

## 2019-06-15 ENCOUNTER — Telehealth: Payer: Self-pay | Admitting: Pharmacist

## 2019-06-15 ENCOUNTER — Encounter: Payer: Self-pay | Admitting: Pharmacist

## 2019-06-15 ENCOUNTER — Other Ambulatory Visit: Payer: Self-pay

## 2019-06-15 ENCOUNTER — Ambulatory Visit: Payer: Medicare PPO | Admitting: Family Medicine

## 2019-06-15 VITALS — BP 138/77 | HR 78 | Temp 97.2°F | Ht 67.5 in | Wt 177.2 lb

## 2019-06-15 DIAGNOSIS — K460 Unspecified abdominal hernia with obstruction, without gangrene: Secondary | ICD-10-CM | POA: Diagnosis not present

## 2019-06-15 DIAGNOSIS — Z09 Encounter for follow-up examination after completed treatment for conditions other than malignant neoplasm: Secondary | ICD-10-CM

## 2019-06-15 DIAGNOSIS — E1165 Type 2 diabetes mellitus with hyperglycemia: Secondary | ICD-10-CM | POA: Diagnosis not present

## 2019-06-15 MED ORDER — ONETOUCH DELICA LANCETS 30G MISC: 3 refills | Status: DC

## 2019-06-15 MED ORDER — ONETOUCH VERIO VI STRP: ORAL_STRIP | 12 refills | Status: DC

## 2019-06-15 MED ORDER — BD PEN NEEDLE NANO 2ND GEN 32G X 4 MM MISC: 3 refills | Status: DC

## 2019-06-15 NOTE — Progress Notes (Signed)
Diabetes Plan 06/15/19   Last A1c 11%  D/c metformin Continue Januvia 100mg  daily Continue Jardiance 25mg  daily Start Basaglar 10 units sq nightly  One touch verio Reflect meter given and set up for patient Insulin teaching and demo provided  Healthy meal planning and guides given  Return to see pharmacy next week to review dietary/lifestyle changes, check BG meter readings and apply for patient assistance

## 2019-06-15 NOTE — Progress Notes (Signed)
Subjective: CC: hospital discharge follow up PCP: [name], DO Debra Miranda is a 74 y.o. female presenting to clinic today for:  1. Hospital discharge follow up Patient was treated for incarcerated umbilical hernia.  She is status post surgical repair.  She continues to eat a soft bland diet.  She saw her surgeon a couple of days ago and had her staples removed.  Overall she is been doing well.  She is somewhat tired since the surgery.  She has minimal pain and tenderness along the incision sites.  No erythema or drainage.  She discontinue use of Metformin due to GI issues.  She continues with Januvia and Jardiance but does note that Vania Rea was quite expensive.  It cost her $40 per month.  She gets patient assistance for the Januvia.  She does not report any urinary symptoms or vaginal symptoms.  She was treated with insulin during the hospitalization.  Her A1c was noted to be over 10.  Her current blood glucose monitor is over 38 years old.  She notes some difficulty pricking her fingers and wants to know if she may be a candidate for the [place] or Dexcom.  Her husband will be going to [place] soon.  ROS: Per HPI  Allergies  Allergen Reactions  . Latex Itching   Past Medical History:  Diagnosis Date  . Arthritis    right hand  . Cataract    RIGHT EYE; now removed  . Depression    many years ago, no meds  . Diabetes mellitus without complication ([place])    type 2  . Endometrial adenocarcinoma ([place]) 01/05/2018   treated with HDR  . Environmental allergies   . Family history of breast cancer   . Family history of kidney cancer   . Family history of pancreatic cancer   . Family history of stomach cancer   . Family history of uterine cancer   . Hemorrhoids   . Nodular basal cell carcinoma ([facility]) 08/2017   LEFT LOWER LEG above foot  . Orthostatic hypotension    Patient is unaware of this diagnosis    Current Outpatient Medications:  .  atorvastatin  (LIPITOR) 20 MG tablet, Take 1 tablet (20 mg total) by mouth daily., Disp: 90 tablet, Rfl: 3 .  Bacillus Coagulans-Inulin (PROBIOTIC) 1-250 BILLION-MG CAPS, Take by mouth., Disp: , Rfl:  .  Calcium Carbonate-Vit D-Min (CALCIUM 1200 PO), Take 1 tablet by mouth at bedtime. , Disp: , Rfl:  .  cetirizine (ZYRTEC) 10 MG tablet, Take 10 mg by mouth daily., Disp: , Rfl:  .  Cholecalciferol (VITAMIN D) 2000 UNITS CAPS, Take 2,000 Units by mouth at bedtime. , Disp: , Rfl:  .  diclofenac Sodium (VOLTAREN) 1 % GEL, Apply 2-4 g topically 4 (four) times daily. If not covered by ins, please help her find the OTC version, Disp: 200 g, Rfl: 3 .  empagliflozin (JARDIANCE) 25 MG TABS tablet, Take 25 mg by mouth daily before breakfast., Disp: 30 tablet, Rfl: 2 .  escitalopram (LEXAPRO) 10 MG tablet, Take 1 tablet (10 mg total) by mouth daily., Disp: 90 tablet, Rfl: 3 .  Ferrous Sulfate (IRON) 90 (18 Fe) MG TABS, Take by mouth., Disp: , Rfl:  .  Multiple Vitamins-Minerals (ONE-A-DAY VITACRAVES) CHEW, Chew by mouth., Disp: , Rfl:  .  nystatin (MYCOSTATIN/NYSTOP) powder, Apply topically 2 (two) times daily., Disp: 60 g, Rfl: 2 .  sitaGLIPtin (JANUVIA) 100 MG tablet, Take 100 mg by mouth at bedtime. ,  Disp: , Rfl:  .  metFORMIN (GLUCOPHAGE) 500 MG tablet, Take 1 tablet (500 mg total) by mouth 2 (two) times daily with a meal. (patient prefers IR over XR. Please cancel previous rx) (Patient not taking: Reported on 06/15/2019), Disp: 180 tablet, Rfl: 3 .  Pumpkin Seed-Soy Germ (AZO BLADDER CONTROL/GO-LESS PO), Take by mouth., Disp: , Rfl:  Social History   Socioeconomic History  . Marital status: Soil scientist    Spouse name: [name]  . Number of children: 1  . Years of education: 70  . Highest education level: Associate degree: occupational, Hotel manager, or vocational program  Occupational History  . Occupation: Retired    Comment: [employer]  Tobacco Use  . Smoking status: Current Every Day Smoker    Years:  45.00    Types: Cigars  . Smokeless tobacco: Never Used  . Tobacco comment: 8 small cigars a day  Substance and Sexual Activity  . Alcohol use: Not Currently  . Drug use: No  . Sexual activity: Not Currently  Other Topics Concern  . Not on file  Social History Narrative  . Not on file   Social Determinants of Health   Financial Resource Strain:   . Difficulty of Paying Living Expenses:   Food Insecurity:   . Worried About Charity fundraiser in the Last Year:   . Arboriculturist in the Last Year:   Transportation Needs:   . Film/video editor (Medical):   Marland Kitchen Lack of Transportation (Non-Medical):   Physical Activity: Inactive  . Days of Exercise per Week: 0 days  . Minutes of Exercise per Session: 0 min  Stress:   . Feeling of Stress :   Social Connections:   . Frequency of Communication with Friends and Family:   . Frequency of Social Gatherings with Friends and Family:   . Attends Religious Services:   . Active Member of Clubs or Organizations:   . Attends Archivist Meetings:   Marland Kitchen Marital Status:   Intimate Partner Violence: Unknown  . Fear of Current or Ex-Partner: Not on file  . Emotionally Abused: Not on file  . Physically Abused: Not on file  . Sexually Abused: No   Family History  Problem Relation Age of Onset  . Cervical cancer Mother 29       treated with radiation  . Pancreatic cancer Mother 58  . Stroke Father   . Pulmonary fibrosis Brother   . Alzheimer's disease Brother   . Stomach cancer Brother        dx 65s  . Diabetes Brother   . Lung cancer Maternal Grandmother   . Stroke Paternal Grandfather   . Uterine cancer Maternal Aunt        dx 30s  . Kidney cancer Maternal Uncle        dx 52s  . Colon cancer Maternal Uncle        dx 62s  . Breast cancer Cousin        maternal cousin dx 61    Objective: Office vital signs reviewed. BP 138/77   Pulse 78   Temp (!) 97.2 F (36.2 C) (Temporal)   Ht 5' 7.5" (1.715 m)   Wt 177 lb  3.2 oz (80.4 kg)   SpO2 95%   BMI 27.34 kg/m   Physical Examination:  General: Awake, alert, chronically ill appearing female, No acute distress HEENT: Normal, sclera white, MMM Cardio: regular rate and rhythm, S1S2 heard, no murmurs appreciated Pulm:  clear to auscultation bilaterally, no wheezes, rhonchi or rales; normal work of breathing on room air GI: soft, non-tender, non-distended, midline incision with Steri-Strips in place.  No active drainage.  No evidence of infection.  Assessment/ Plan: 74 y.o. female   Today's visit is for Transitional Care Management.  The patient was discharged from [facility] on 06/06/2019 with a primary diagnosis of incarcerated hernia.   Contact with the patient and/or caregiver, by a clinical staff member, was made on 06/06/2019 and was documented as a telephone encounter within the EMR.  Through chart review and discussion with the patient I have determined that management of their condition is of moderate complexity.   1. Hospital discharge follow-up I reviewed her hospital discharge follow-up and recommendations.  She appears clinically to be much improved there was no evidence of secondary bacterial infection at her incision sites today.  She has not yet regained her strength but I think this will gradually get better.  Additionally, we discussed that the blood sugar is likely impacting some of her energy.  She was given samples of Jardiance and we are going to see if we can get her set up with patient assistance for this medicine.  I have discontinued Metformin since it was causing her quite a bit of GI distress.  Additionally, she met with our clinical pharmacist to discuss and be shown how to use insulin.  She was started on Basaglar 10 units nightly.  We will plan to increase by 1 unit every 2 days for fasting blood sugar above 150.  She was also given a new meter.  She was able to demonstrate correct injection here in office.  She will continue  Januvia 100 mg daily.  She will see me back in 3 months, sooner if needed  2. Uncontrolled type 2 diabetes mellitus with hyperglycemia ([place])  3. Incarcerated hernia   No orders of the defined types were placed in this encounter.  No orders of the defined types were placed in this encounter.    [name], DO [place] [phone]

## 2019-06-15 NOTE — Telephone Encounter (Signed)
RXs sent in for: One touch verio test strips (device given) One touch delica lancets (device given) BD ultra fine 32G 58mm pen needles #100 for use with Basaglar insulin

## 2019-06-20 ENCOUNTER — Other Ambulatory Visit: Payer: Self-pay

## 2019-06-20 ENCOUNTER — Ambulatory Visit: Payer: Medicare PPO | Admitting: Pharmacist

## 2019-06-20 NOTE — Telephone Encounter (Signed)
Error

## 2019-06-21 ENCOUNTER — Telehealth: Payer: Self-pay

## 2019-06-21 NOTE — Telephone Encounter (Signed)
Patient left message stating her poop is brownish color today however it has been blackish in color.   Called patient to find out more information but she did not answer.

## 2019-06-22 NOTE — Telephone Encounter (Signed)
Spoke with patient- she states she goes back and forth with having brown - blackish colored stool.

## 2019-06-22 NOTE — Telephone Encounter (Signed)
Spoke with patient -she states she is feeling better. Stools are brownish color-regular bowel movements. She states she does have hemorrhoids-if they become bothersome she will try preparation H which usually helps a lot per patient.

## 2019-06-30 ENCOUNTER — Telehealth: Payer: Self-pay | Admitting: Family Medicine

## 2019-06-30 NOTE — Telephone Encounter (Signed)
Incoming call from patient:  -Problems with urine-painful right before just for a few seconds -Patient denies fever, chills, hematuria (she has had urological issues in the past and is followed by Urology) -She reports it started when the insulin was initiated -In addition to insulin-->she remains on Jardiance and Januvia for T2DM, diet has improved - I do not believe that this is related to the insulin, however patient can hold PM insulin dose tonight and see how her BGs do.  Resume insulin if BG starts to increase above 180 -Jardiance is more likely to cause this issue, however patient states she has been fine on this medication since FEB 2021--may have to decrease vs. D/c this medication if symptoms persist   BGs: -FBG 130 -Encouraged patient to increase fluid intake -She is currently drinking crystal light, water, 1 diet coke/per day  Instructed patient to call PCP office on Monday. Instructed patient to go to urgent care over the weekend if she develops fever, chills, blood in the urine---or pain does not improve  Regina Eck, PharmD, BCPS Clinical Pharmacist, [place]  II Phone [phone]

## 2019-07-03 ENCOUNTER — Telehealth: Payer: Self-pay | Admitting: Family Medicine

## 2019-07-03 ENCOUNTER — Ambulatory Visit: Payer: Medicare PPO | Admitting: Family Medicine

## 2019-07-03 NOTE — Telephone Encounter (Signed)
  BG restarted insulin --FBGs 150-180, continue current management Denies any adverse events Feeling back to her baseline LIS/extra help application filled out with patient  BI to send Jardiance RX to her home (90-day supply) [facility] pending basaglar shipment

## 2019-07-20 DIAGNOSIS — R350 Frequency of micturition: Secondary | ICD-10-CM | POA: Diagnosis not present

## 2019-07-20 DIAGNOSIS — N3946 Mixed incontinence: Secondary | ICD-10-CM | POA: Diagnosis not present

## 2019-07-21 DIAGNOSIS — N319 Neuromuscular dysfunction of bladder, unspecified: Secondary | ICD-10-CM | POA: Diagnosis not present

## 2019-07-21 DIAGNOSIS — R3914 Feeling of incomplete bladder emptying: Secondary | ICD-10-CM | POA: Diagnosis not present

## 2019-07-21 DIAGNOSIS — R350 Frequency of micturition: Secondary | ICD-10-CM | POA: Diagnosis not present

## 2019-07-25 DIAGNOSIS — R339 Retention of urine, unspecified: Secondary | ICD-10-CM | POA: Diagnosis not present

## 2019-07-28 ENCOUNTER — Telehealth: Payer: Self-pay | Admitting: Family Medicine

## 2019-08-02 ENCOUNTER — Telehealth: Payer: Self-pay | Admitting: Family Medicine

## 2019-08-02 NOTE — Telephone Encounter (Signed)
appt scheduled with patient to review DM/LIS

## 2019-08-02 NOTE — Telephone Encounter (Signed)
appt scheduled tom w/ PharmD 6/16 at 11am

## 2019-08-03 ENCOUNTER — Ambulatory Visit: Payer: Medicare PPO | Admitting: Pharmacist

## 2019-08-03 ENCOUNTER — Other Ambulatory Visit: Payer: Self-pay

## 2019-08-03 VITALS — BP 121/74 | HR 84

## 2019-08-03 DIAGNOSIS — R8271 Bacteriuria: Secondary | ICD-10-CM | POA: Diagnosis not present

## 2019-08-03 DIAGNOSIS — R350 Frequency of micturition: Secondary | ICD-10-CM | POA: Diagnosis not present

## 2019-08-03 DIAGNOSIS — N319 Neuromuscular dysfunction of bladder, unspecified: Secondary | ICD-10-CM | POA: Diagnosis not present

## 2019-08-03 DIAGNOSIS — N3000 Acute cystitis without hematuria: Secondary | ICD-10-CM | POA: Diagnosis not present

## 2019-08-03 DIAGNOSIS — E1165 Type 2 diabetes mellitus with hyperglycemia: Secondary | ICD-10-CM | POA: Diagnosis not present

## 2019-08-03 DIAGNOSIS — N3946 Mixed incontinence: Secondary | ICD-10-CM | POA: Diagnosis not present

## 2019-08-03 MED ORDER — EMPAGLIFLOZIN 25 MG PO TABS: 25.0000 mg | ORAL_TABLET | Freq: Every day | ORAL | 2 refills | Status: DC

## 2019-08-03 NOTE — Progress Notes (Signed)
    08/03/2019 Name: Debra Miranda MRN: [id number] DOB: 06-04-1945   S:  78 yoF presents for diabetes evaluation, education, and management Patient was referred and last seen by Primary Care Provider on 06/15/19.  Insurance coverage/medication affordability: HUMANA  Patient reports adherence with medications. . Current diabetes medications include: Basaglar, Celesta Gentile, jardiance . Current hypertension medications include: n/a Goal 130/80 . Current hyperlipidemia medications include: atorvastatin  Patient denies hypoglycemic events.   Patient reported dietary habits: Eats 2-3 meals/day Discussed meal planning options and Plate method for healthy eating Avoid sugary drinks and desserts Incorporate balanced protein, non starchy veggies, 1 serving of carbohydrate Increase water intake . Increase physical activity as able.   Patient-reported exercise habits: n/a  Patient denies nocturia (nighttime urination).  Patient denies neuropathy (nerve pain).  Patient denies visual changes.  Patient reports self foot exams.    O:  Lab Results  Component Value Date   HGBA1C 10.6 (H) 06/02/2019    There were no vitals filed for this visit.     Lipid Panel     Component Value Date/Time   CHOL 124 08/27/2017 1314   TRIG 126 08/27/2017 1314   TRIG 108 05/09/2014 1403   HDL 53 08/27/2017 1314   HDL 51 05/09/2014 1403   CHOLHDL 2.3 08/27/2017 1314   LDLCALC 46 08/27/2017 1314   FBGs 5/27 249 130 149 196 247 166 136 138 167  156 223 182 131 182 166 193  A/P:  Diabetes T2DM currently UNCONTROLLED. Patient is able to verbalize appropriate hypoglycemia management plan. Patient is adherent with medication. Control is suboptimal due to diet.   Continue Januvia 100mg  daily Continue Jardiance 25mg  daily--samples [phone] Exp 6/23  #14  Increase Basaglar 14 units sq nightly  -Discovered patient has LIS/extrahelp 75%  -Extensively discussed pathophysiology of  diabetes, recommended lifestyle interventions, dietary effects on blood sugar control  -Counseled on s/sx of and management of hypoglycemia  -Next A1C anticipated 08/2019     Written patient instructions provided.  Total time in face to face counseling 30  minutes.   Follow up PCP Clinic Visit 09/06/19.   Regina Eck, PharmD, BCPS Clinical Pharmacist, [place]  II Phone [phone]

## 2019-08-08 ENCOUNTER — Telehealth: Payer: Self-pay | Admitting: Family Medicine

## 2019-08-14 ENCOUNTER — Encounter: Payer: Self-pay | Admitting: Pharmacist

## 2019-08-24 DIAGNOSIS — E119 Type 2 diabetes mellitus without complications: Secondary | ICD-10-CM | POA: Diagnosis not present

## 2019-08-24 DIAGNOSIS — H524 Presbyopia: Secondary | ICD-10-CM | POA: Diagnosis not present

## 2019-08-24 LAB — HM DIABETES EYE EXAM

## 2019-09-04 DIAGNOSIS — H43813 Vitreous degeneration, bilateral: Secondary | ICD-10-CM | POA: Diagnosis not present

## 2019-09-05 DIAGNOSIS — R350 Frequency of micturition: Secondary | ICD-10-CM | POA: Diagnosis not present

## 2019-09-05 DIAGNOSIS — N3946 Mixed incontinence: Secondary | ICD-10-CM | POA: Diagnosis not present

## 2019-09-05 DIAGNOSIS — N319 Neuromuscular dysfunction of bladder, unspecified: Secondary | ICD-10-CM | POA: Diagnosis not present

## 2019-09-07 ENCOUNTER — Telehealth: Payer: Self-pay | Admitting: Family Medicine

## 2019-09-07 NOTE — Telephone Encounter (Signed)
Pt called requesting to speak with Almyra Free. Says she spoke with someone about her insurance covering her One Touch Insulin Strips. Says her insurance wants her to switch to Accu Check because of price difference. Pt says she doesn't have the right machine to use Accu Check strips. Wants advice from [place].

## 2019-09-08 MED ORDER — ACCU-CHEK GUIDE W/DEVICE KIT: PACK | 0 refills | Status: DC

## 2019-09-08 MED ORDER — ACCU-CHEK GUIDE VI STRP: ORAL_STRIP | 12 refills | Status: DC

## 2019-09-08 NOTE — Telephone Encounter (Signed)
AccuCheck Guide meter called in per patient request.  She states the insurance prefers AccuCheck Encouraged patient to check price before picking up Encouraged patient to call if needs arise

## 2019-09-15 ENCOUNTER — Ambulatory Visit: Payer: Medicare PPO | Admitting: Family Medicine

## 2019-09-15 ENCOUNTER — Encounter: Payer: Self-pay | Admitting: Family Medicine

## 2019-09-15 ENCOUNTER — Other Ambulatory Visit: Payer: Self-pay

## 2019-09-15 VITALS — BP 118/72 | HR 90 | Temp 97.2°F | Ht 67.5 in | Wt 163.2 lb

## 2019-09-15 DIAGNOSIS — Z794 Long term (current) use of insulin: Secondary | ICD-10-CM

## 2019-09-15 DIAGNOSIS — E785 Hyperlipidemia, unspecified: Secondary | ICD-10-CM | POA: Diagnosis not present

## 2019-09-15 DIAGNOSIS — E1169 Type 2 diabetes mellitus with other specified complication: Secondary | ICD-10-CM

## 2019-09-15 DIAGNOSIS — M8949 Other hypertrophic osteoarthropathy, multiple sites: Secondary | ICD-10-CM

## 2019-09-15 DIAGNOSIS — E1165 Type 2 diabetes mellitus with hyperglycemia: Secondary | ICD-10-CM | POA: Diagnosis not present

## 2019-09-15 DIAGNOSIS — Z9889 Other specified postprocedural states: Secondary | ICD-10-CM

## 2019-09-15 DIAGNOSIS — M159 Polyosteoarthritis, unspecified: Secondary | ICD-10-CM

## 2019-09-15 LAB — BAYER DCA HB A1C WAIVED: HB A1C (BAYER DCA - WAIVED): 6.9 % (ref <7.0)

## 2019-09-15 MED ORDER — NAPROXEN 500 MG PO TABS: 500.0000 mg | ORAL_TABLET | Freq: Two times a day (BID) | ORAL | 5 refills | Status: DC | PRN

## 2019-09-15 NOTE — Patient Instructions (Addendum)
Sugar is EXCELLENT! Keep up the good work. No changes.  Stick with current regimen.  Schedule Mammogram and colonoscopy  Arthritis medication sent.  TAKE WITH FOOD.  TAKE SEPARATELY from the Lexapro.

## 2019-09-15 NOTE — Progress Notes (Signed)
Subjective: CC: DM2 PCP: [name], DO BWL:SLHTD C Maj is a 74 y.o. female presenting to clinic today for:  1. Type 2 Diabetes w/ HLD Patient reports compliance w Januvia 128m, Metformin IR 500 mg daily (cannot tolerate twice daily dosing nor the XR version), Lipitor 238m Jardiance 2569mnd Basaglar 14 units subcu daily.  Blood sugars have been ranging anywhere from low one hundreds to mid one hundreds on average.  She had an outlier over 200 once.  Denies any hypoglycemic episodes.  Tolerating medication regimen without difficulty  Last eye exam: Sees Dr. BowDeon PillingHas appointment later this week Last foot exam: UTD Last A1c:  Lab Results  Component Value Date   HGBA1C 10.6 (H) 06/02/2019   Nephropathy screen indicated?: UTD Last flu, zoster and/or pneumovax: UTD Immunization History  Administered Date(s) Administered  . Fluad Quad(high Dose 65+) 11/15/2018  . Influenza, High Dose Seasonal PF 11/29/2015, 11/18/2016, 12/06/2017  . Influenza,inj,Quad PF,6+ Mos 12/07/2013, 11/12/2014  . PFIZER SARS-COV-2 Vaccination 04/16/2019, 05/16/2019  . Pneumococcal Conjugate-13 03/17/2013  . Pneumococcal Polysaccharide-23 07/29/2017, 02/09/2018    2.  Osteoarthritis Patient reports bilateral shoulder pain and right hand pain, particularly with certain movements.  She also has a right-sided hip pain.  Symptoms are somewhat relieved by topical Voltaren but she "has to be than it" for it to be effective.  She is asking for an oral pill.  Does not want anything addictive.    Allergies  Allergen Reactions  . Latex Itching   Past Medical History:  Diagnosis Date  . Arthritis    right hand  . Cataract    RIGHT EYE; now removed  . Depression    many years ago, no meds  . Diabetes mellitus without complication ([facility]  type 2  . Endometrial adenocarcinoma ([facility] [place]1/20/2019   treated with HDR  . Environmental allergies   . Family history of breast cancer   . Family history  of kidney cancer   . Family history of pancreatic cancer   . Family history of stomach cancer   . Family history of uterine cancer   . Hemorrhoids   . Nodular basal cell carcinoma (BCC) 08/2017   LEFT LOWER LEG above foot  . Orthostatic hypotension    Patient is unaware of this diagnosis    Current Outpatient Medications:  .  atorvastatin (LIPITOR) 20 MG tablet, Take 1 tablet (20 mg total) by mouth daily., Disp: 90 tablet, Rfl: 3 .  Bacillus Coagulans-Inulin (PROBIOTIC) 1-250 BILLION-MG CAPS, Take by mouth., Disp: , Rfl:  .  Blood Glucose Monitoring Suppl (ACCU-CHEK GUIDE) w/Device KIT, Use to test blood sugar up to 4 times daily. DX: E11.9, Disp: 1 kit, Rfl: 0 .  Calcium Carbonate-Vit D-Min (CALCIUM 1200 PO), Take 1 tablet by mouth at bedtime. , Disp: , Rfl:  .  cetirizine (ZYRTEC) 10 MG tablet, Take 10 mg by mouth daily., Disp: , Rfl:  .  Cholecalciferol (VITAMIN D) 2000 UNITS CAPS, Take 2,000 Units by mouth at bedtime. , Disp: , Rfl:  .  diclofenac Sodium (VOLTAREN) 1 % GEL, Apply 2-4 g topically 4 (four) times daily. If not covered by ins, please help her find the OTC version, Disp: 200 g, Rfl: 3 .  empagliflozin (JARDIANCE) 25 MG TABS tablet, Take 1 tablet (25 mg total) by mouth daily before breakfast., Disp: 30 tablet, Rfl: 2 .  escitalopram (LEXAPRO) 10 MG tablet, Take 1 tablet (10 mg total) by mouth daily., Disp: 90 tablet,  Rfl: 3 .  Ferrous Sulfate (IRON) 90 (18 Fe) MG TABS, Take by mouth., Disp: , Rfl:  .  glucose blood (ACCU-CHEK GUIDE) test strip, Use to test blood sugar up to 4 times daily. DX: E11.9, Disp: 100 each, Rfl: 12 .  Insulin Glargine (BASAGLAR KWIKPEN) 100 UNIT/ML, Inject 12 Units into the skin at bedtime. Sample #1pen LOT [id number], EXP 8/21 , Disp: , Rfl:  .  Insulin Pen Needle (BD PEN NEEDLE NANO 2ND GEN) 32G X 4 MM MISC, Use to inject insulin daily as directed. DX: E11.9, Disp: 100 each, Rfl: 3 .  Multiple Vitamins-Minerals (ONE-A-DAY VITACRAVES) CHEW, Chew by  mouth., Disp: , Rfl:  .  nystatin (MYCOSTATIN/NYSTOP) powder, Apply topically 2 (two) times daily., Disp: 60 g, Rfl: 2 .  OneTouch Delica Lancets 89Q MISC, Use to test blood sugar daily as directed.  DX: E11.9, Disp: 100 each, Rfl: 3 .  Pumpkin Seed-Soy Germ (AZO BLADDER CONTROL/GO-LESS PO), Take by mouth., Disp: , Rfl:  .  sitaGLIPtin (JANUVIA) 100 MG tablet, Take 100 mg by mouth at bedtime. , Disp: , Rfl:  Social History   Socioeconomic History  . Marital status: Soil scientist    Spouse name: [name]  . Number of children: 1  . Years of education: 25  . Highest education level: Associate degree: occupational, Hotel manager, or vocational program  Occupational History  . Occupation: Retired    Comment: [employer]  Tobacco Use  . Smoking status: Current Every Day Smoker    Years: 45.00    Types: Cigars  . Smokeless tobacco: Never Used  . Tobacco comment: 8 small cigars a day  Vaping Use  . Vaping Use: Former  . Start date: 02/16/2010  . Quit date: 02/17/2011  . Devices: unsure  Substance and Sexual Activity  . Alcohol use: Not Currently  . Drug use: No  . Sexual activity: Not Currently  Other Topics Concern  . Not on file  Social History Narrative  . Not on file   Social Determinants of Health   Financial Resource Strain:   . Difficulty of Paying Living Expenses:   Food Insecurity:   . Worried About Charity fundraiser in the Last Year:   . Arboriculturist in the Last Year:   Transportation Needs:   . Film/video editor (Medical):   Marland Kitchen Lack of Transportation (Non-Medical):   Physical Activity: Inactive  . Days of Exercise per Week: 0 days  . Minutes of Exercise per Session: 0 min  Stress:   . Feeling of Stress :   Social Connections:   . Frequency of Communication with Friends and Family:   . Frequency of Social Gatherings with Friends and Family:   . Attends Religious Services:   . Active Member of Clubs or Organizations:   . Attends Archivist  Meetings:   Marland Kitchen Marital Status:   Intimate Partner Violence: Unknown  . Fear of Current or Ex-Partner: Not on file  . Emotionally Abused: Not on file  . Physically Abused: Not on file  . Sexually Abused: No   Family History  Problem Relation Age of Onset  . Cervical cancer Mother 62       treated with radiation  . Pancreatic cancer Mother 69  . Stroke Father   . Pulmonary fibrosis Brother   . Alzheimer's disease Brother   . Stomach cancer Brother        dx 83s  . Diabetes Brother   . Lung cancer  Maternal Grandmother   . Stroke Paternal Grandfather   . Uterine cancer Maternal Aunt        dx 49s  . Kidney cancer Maternal Uncle        dx 24s  . Colon cancer Maternal Uncle        dx 71s  . Breast cancer Cousin        maternal cousin dx 74    Objective: Office vital signs reviewed. BP 118/72   Pulse 90   Temp (!) 97.2 F (36.2 C)   Ht 5' 7.5" (1.715 m)   Wt 163 lb 3.2 oz (74 kg)   SpO2 98%   BMI 25.18 kg/m   Physical Examination:  General: Awake, alert, well appearing, No acute distress HEENT: sclera white, MMM Cardio: regular rate and rhythm, S1S2 heard, no murmurs appreciated Pulm: clear to auscultation bilaterally, no wheezes, rhonchi or rales; normal work of breathing on room air Extremities: warm, No edema, cyanosis or clubbing; +1 pedal pulses bilaterally MSK: Ambulating independently.  No gross deformity, soft tissue swelling or erythema noted of the joints.  Assessment/ Plan: 74 y.o. female   1. Controlled type 2 diabetes mellitus with other specified complication, with long-term current use of insulin (HCC) A1c down to controlled range at 6.9 today.  Continue current regimen.  May follow-up in 4 months - Bayer DCA Hb A1c Waived  2. Hyperlipidemia associated with type 2 diabetes mellitus ([place]) Continue statin  3. Primary osteoarthritis involving multiple joints Trial of Naprosyn 500 mg twice daily.  Take away from SSRI as she has increased GI bleed risk  with these medications  4. 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   Orders Placed This Encounter  Procedures  . Bayer DCA Hb A1c Waived   Meds ordered this encounter  Medications  . naproxen (NAPROSYN) 500 MG tablet    Sig: Take 1 tablet (500 mg total) by mouth 2 (two) times daily as needed (joint pain).    Dispense:  60 tablet    Refill:  [place], [place] [phone]

## 2019-10-25 DIAGNOSIS — R339 Retention of urine, unspecified: Secondary | ICD-10-CM | POA: Diagnosis not present

## 2019-11-13 ENCOUNTER — Ambulatory Visit
Admission: RE | Admit: 2019-11-13 | Discharge: 2019-11-13 | Disposition: A | Discharge status: Home / Self Care | Payer: Medicare PPO | Source: Ambulatory Visit | Attending: Radiation Oncology | Admitting: Radiation Oncology

## 2019-11-13 ENCOUNTER — Encounter: Payer: Self-pay | Admitting: Radiation Oncology

## 2019-11-13 ENCOUNTER — Other Ambulatory Visit: Payer: Self-pay

## 2019-11-13 VITALS — BP 132/74 | HR 79 | Temp 97.8°F | Resp 20 | Ht 67.5 in | Wt 165.8 lb

## 2019-11-13 DIAGNOSIS — Z8542 Personal history of malignant neoplasm of other parts of uterus: Secondary | ICD-10-CM | POA: Diagnosis not present

## 2019-11-13 DIAGNOSIS — C541 Malignant neoplasm of endometrium: Secondary | ICD-10-CM

## 2019-11-13 DIAGNOSIS — R918 Other nonspecific abnormal finding of lung field: Secondary | ICD-10-CM | POA: Insufficient documentation

## 2019-11-13 DIAGNOSIS — K429 Umbilical hernia without obstruction or gangrene: Secondary | ICD-10-CM | POA: Diagnosis not present

## 2019-11-13 DIAGNOSIS — Z923 Personal history of irradiation: Secondary | ICD-10-CM | POA: Diagnosis not present

## 2019-11-13 DIAGNOSIS — Z79899 Other long term (current) drug therapy: Secondary | ICD-10-CM | POA: Insufficient documentation

## 2019-11-13 DIAGNOSIS — Z08 Encounter for follow-up examination after completed treatment for malignant neoplasm: Secondary | ICD-10-CM | POA: Diagnosis not present

## 2019-11-13 NOTE — Progress Notes (Signed)
Patient here for a f/u visit with Dr. Sondra Come. She reports having a strangulated hernia in April, 2021. Patient denies pain, vaginal bleeding or problems with her bladder or bowels.  Patients' husband died in 2022-08-08 of this year.  BP 132/74 (BP Location: Right Arm, Patient Position: Sitting, Cuff Size: Normal)   Pulse 79   Temp 97.8 F (36.6 C)   Resp 20   Ht 5' 7.5" (1.715 m)   Wt 165 lb 12.8 oz (75.2 kg)   SpO2 96%   BMI 25.58 kg/m   Wt Readings from Last 3 Encounters:  11/13/19 165 lb 12.8 oz (75.2 kg)  09/15/19 163 lb 3.2 oz (74 kg)  06/15/19 177 lb 3.2 oz (80.4 kg)

## 2019-11-13 NOTE — Progress Notes (Signed)
Radiation Oncology         [phone] ________________________________  Name: Debra Miranda MRN: [id number]  Date: 11/13/2019  DOB: 22-Apr-1945  Follow-Up Visit Note  CC: [name], DO  [name], DO    ICD-10-CM   1. Endometrial adenocarcinoma (HCC)  C54.1     Diagnosis: FIGO grade 2, Stage IA, Endometrial adenocarcinoma (HCC)  Interval Since Last Radiation: One year, six months, one week, and two days  04/05/2018, 04/12/2018, 04/19/2018, 04/26/2018, 05/04/2018: Vagina, Iridium HDR, 3 cm cylinder, tx length of 3 cm / 30 Gy in 5 fractions  Narrative: The patient returns today for routine follow-up. Since her last visit, she was seen in the ED on 06/01/2019 for evaluation of central abdominal discomfort with enlarging umbilical hernia. CT of abdomen and pelvis was performed at that time and showed an umbilical hernia that contained fat and a loop of small bowel. There was apparent twisting of bowel within the hernia with a focal transition zone that was indicative of focal small bowel obstruction at the level of the umbilical hernia. There was no intramural air or fluid surrounding the twisted bowel within the hernia, nor was there any free air. Additionally, there was a 3 mm nodular opacity abutting the pleura in the posterior left base. Chest x-ray was unremarkable.   While in the ED, the patient underwent a bedside nasogastric tube placement. She then underwent a partial small bowel resection with primary side to side anastomosis, partial omentectomy, and primary umbilical hernia repair under the care of Dr. Constance Haw. Pathology from the procedure did not reveal any malignancy. The patient was admitted to the hospital, stabilized, and discharged home on 06/06/2019.  On review of systems, she reports no complaints. She denies pelvic pain, vaginal bleeding, and changes in bowel/bladder patterns.  She has been using her vaginal dilator.  Allergies:  is allergic to  latex.  Meds: Current Outpatient Medications  Medication Sig Dispense Refill  . atorvastatin (LIPITOR) 20 MG tablet Take 1 tablet (20 mg total) by mouth daily. 90 tablet 3  . Bacillus Coagulans-Inulin (PROBIOTIC) 1-250 BILLION-MG CAPS Take by mouth.    . Blood Glucose Monitoring Suppl (ACCU-CHEK GUIDE) w/Device KIT Use to test blood sugar up to 4 times daily. DX: E11.9 1 kit 0  . Calcium Carbonate-Vit D-Min (CALCIUM 1200 PO) Take 1 tablet by mouth at bedtime.     . cetirizine (ZYRTEC) 10 MG tablet Take 10 mg by mouth daily.    . Cholecalciferol (VITAMIN D) 2000 UNITS CAPS Take 2,000 Units by mouth at bedtime.     . diclofenac Sodium (VOLTAREN) 1 % GEL Apply 2-4 g topically 4 (four) times daily. If not covered by ins, please help her find the OTC version 200 g 3  . empagliflozin (JARDIANCE) 25 MG TABS tablet Take 1 tablet (25 mg total) by mouth daily before breakfast. 30 tablet 2  . escitalopram (LEXAPRO) 10 MG tablet Take 1 tablet (10 mg total) by mouth daily. 90 tablet 3  . Ferrous Sulfate (IRON) 90 (18 Fe) MG TABS Take by mouth.    Marland Kitchen glucose blood (ACCU-CHEK GUIDE) test strip Use to test blood sugar up to 4 times daily. DX: E11.9 100 each 12  . Insulin Glargine (BASAGLAR KWIKPEN) 100 UNIT/ML Inject 12 Units into the skin at bedtime. Sample #1pen LOT [id number] Turner, EXP 8/21     . Insulin Pen Needle (BD PEN NEEDLE NANO 2ND GEN) 32G X 4 MM MISC Use to inject insulin daily  as directed. DX: E11.9 100 each 3  . Multiple Vitamins-Minerals (ONE-A-DAY VITACRAVES) CHEW Chew by mouth.    . naproxen (NAPROSYN) 500 MG tablet Take 1 tablet (500 mg total) by mouth 2 (two) times daily as needed (joint pain). 60 tablet 5  . nystatin (MYCOSTATIN/NYSTOP) powder Apply topically 2 (two) times daily. 60 g 2  . OneTouch Delica Lancets 68G MISC Use to test blood sugar daily as directed.  DX: E11.9 100 each 3  . Pumpkin Seed-Soy Germ (AZO BLADDER CONTROL/GO-LESS PO) Take by mouth.    . sitaGLIPtin (JANUVIA) 100 MG  tablet Take 100 mg by mouth at bedtime.      No current facility-administered medications for this encounter.    Physical Findings: The patient is in no acute distress. Patient is alert and oriented.  height is 5' 7.5" (1.715 m) and weight is 165 lb 12.8 oz (75.2 kg). Her temperature is 97.8 F (36.6 C). Her blood pressure is 132/74 and her pulse is 79. Her respiration is 20 and oxygen saturation is 96%. .  No significant changes. Lungs are clear to auscultation bilaterally. Heart has regular rate and rhythm. No palpable cervical, supraclavicular, or axillary adenopathy. Abdomen soft, non-tender, normal bowel sounds.  On pelvic examination the external genitalia were unremarkable. A speculum exam was performed. There are no mucosal lesions noted in the vaginal vault. On bimanual  examination there were no pelvic masses appreciated. Vaginal cuff intact.  Some radiation changes noted at the vaginal cuff. periumbilical scar noted from her recent emergency surgery.  Healed well without signs of drainage or infection   Lab Findings: Lab Results  Component Value Date   WBC 6.6 06/03/2019   HGB 13.1 06/03/2019   HCT 40.5 06/03/2019   MCV 91.8 06/03/2019   PLT 95 (L) 06/03/2019    Radiographic Findings: No results found.  Impression:  FIGO grade 2, Stage IA, Endometrial adenocarcinoma   No evidence of recurrence on clinical exam today.   Plan: The patient should follow-up with Dr. Berline Lopes in three months. She will follow-up with radiation oncology in six months.  Total time spent in this encounter was 25 minutes which included reviewing the patient's most recent ED visit, abdominal/pelvic CT scan, chest x-ray, hospitalization, surgery, pathology report, physical examination, and documentation.   ____________________________________   Blair Promise, PhD, MD  This document serves as a record of services personally performed by Gery Pray, MD. It was created on his behalf by Clerance Lav, a trained medical scribe. The creation of this record is based on the scribe's personal observations and the provider's statements to them. This document has been checked and approved by the attending provider.

## 2019-11-22 ENCOUNTER — Ambulatory Visit (INDEPENDENT_AMBULATORY_CARE_PROVIDER_SITE_OTHER): Payer: Medicare PPO

## 2019-11-22 ENCOUNTER — Other Ambulatory Visit: Payer: Self-pay

## 2019-11-22 DIAGNOSIS — Z23 Encounter for immunization: Secondary | ICD-10-CM | POA: Diagnosis not present

## 2019-12-08 ENCOUNTER — Telehealth: Payer: Self-pay

## 2019-12-08 DIAGNOSIS — E1165 Type 2 diabetes mellitus with hyperglycemia: Secondary | ICD-10-CM

## 2019-12-08 MED ORDER — BASAGLAR KWIKPEN 100 UNIT/ML ~~LOC~~ SOPN: 20.0000 [IU] | PEN_INJECTOR | Freq: Every day | SUBCUTANEOUS | 3 refills | Status: DC

## 2019-12-08 MED ORDER — EMPAGLIFLOZIN 25 MG PO TABS: 25.0000 mg | ORAL_TABLET | Freq: Every day | ORAL | 2 refills | Status: DC

## 2019-12-08 NOTE — Telephone Encounter (Signed)
RXs sent to [facility] (basaglar, jardiance) to see what copays will be for patient with the addition of 75% L[place]/extra help

## 2019-12-11 ENCOUNTER — Telehealth: Payer: Self-pay

## 2019-12-11 NOTE — Telephone Encounter (Signed)
accuchek guide meter not working Encouraged patient to drop off meter for me to check  [place]) patient assistance

## 2019-12-11 NOTE — Telephone Encounter (Signed)
jardiance (SGLT2) is $47/month under LIS extra help Will attempt to go through [place] to obtain farxiga (SGLT2) free through patient assistance.   VM LEFT WITH PATIENT TO RETURN CALL TO PHARMD

## 2019-12-12 ENCOUNTER — Telehealth: Payer: Self-pay

## 2019-12-18 ENCOUNTER — Encounter: Payer: Self-pay | Admitting: *Deleted

## 2020-01-01 ENCOUNTER — Ambulatory Visit (INDEPENDENT_AMBULATORY_CARE_PROVIDER_SITE_OTHER): Payer: Medicare PPO | Admitting: Pharmacist

## 2020-01-01 ENCOUNTER — Other Ambulatory Visit: Payer: Self-pay

## 2020-01-01 DIAGNOSIS — E119 Type 2 diabetes mellitus without complications: Secondary | ICD-10-CM | POA: Diagnosis not present

## 2020-01-01 NOTE — Progress Notes (Signed)
    01/01/2020 Name: Debra Miranda MRN: [id number] DOB: 1945/09/22   S:  35 yoF Presents for diabetes evaluation, education, and management Patient was referred and last seen by Primary Care Provider on 08/03/19.  Insurance coverage/medication affordability: [employer] MEDICARE  Patient reports adherence with medications. . Current diabetes medications include: JARDIANCE-->FARXIGA, BASAGLAR, JANUVIA . Current hypertension medications include: -- Goal 130/80 . Current hyperlipidemia medications include: ATORVASTATIN   Patient denies hypoglycemic events.   Patient reported dietary habits: Eats 2-3 meals/day BOILED EGG SANDWICH lunch  CEREAL THIS MORNING BG TODAY WAS 110 Discussed meal planning options and Plate method for healthy eating . Avoid sugary drinks and desserts . Incorporate balanced protein, non starchy veggies, 1 serving of carbohydrate with each meal . Increase water intake . Increase physical activity as able  Patient-reported exercise habits: N/A  O:  Lab Results  Component Value Date   HGBA1C 6.9 09/15/2019    Lipid Panel     Component Value Date/Time   CHOL 124 08/27/2017 1314   TRIG 126 08/27/2017 1314   TRIG 108 05/09/2014 1403   HDL 53 08/27/2017 1314   HDL 51 05/09/2014 1403   CHOLHDL 2.3 08/27/2017 1314   LDLCALC 46 08/27/2017 1314    Home fasting blood sugars: 100S  2 hour post-meal/random blood sugars: N/A. 110 in office   A/P:  Diabetes t2dm currently CONTROLLED. Patient is adherent with medication.  -Continue medications as prescribed  -Assisted patient with accuchek guide glucometer set up; now she is able to use  -Enrolled patient for next year patient assistance (basaglar, farxiga)  -Extensively discussed pathophysiology of diabetes, recommended lifestyle interventions, dietary effects on blood sugar control  -Counseled on s/sx of and management of hypoglycemia  -Next A1C anticipated 01/16/20.  Written patient instructions  provided.  Total time in face to face counseling 30 minutes.   Follow up [facility] Visit ON 01/16/20.     Regina Eck, PharmD, BCPS Clinical Pharmacist, [place]  II Phone [phone]   .

## 2020-01-04 ENCOUNTER — Telehealth: Payer: Self-pay | Admitting: Family Medicine

## 2020-01-04 ENCOUNTER — Telehealth: Payer: Self-pay

## 2020-01-04 NOTE — Telephone Encounter (Signed)
Pt wanted to ask questions about a medication that was replaced

## 2020-01-04 NOTE — Telephone Encounter (Signed)
Returned patient call  [employer] Patient assistance for [place] filled out

## 2020-01-04 NOTE — Telephone Encounter (Signed)
Call returned to patient with no answer Left VM

## 2020-01-09 ENCOUNTER — Telehealth: Payer: Self-pay | Admitting: Pharmacist

## 2020-01-09 NOTE — Telephone Encounter (Signed)
Patient assistance applications for [id number]: [employer] Celesta Gentile) was hand mailed per protocol (will deliver to pt home) [employer] (basaglar) was faxed and successfully delivered (will deliver to patient home)

## 2020-01-16 ENCOUNTER — Ambulatory Visit: Payer: Medicare PPO | Admitting: Family Medicine

## 2020-01-16 ENCOUNTER — Other Ambulatory Visit: Payer: Self-pay

## 2020-01-16 VITALS — BP 131/76 | HR 87 | Temp 97.4°F | Ht 67.5 in | Wt 167.0 lb

## 2020-01-16 DIAGNOSIS — E785 Hyperlipidemia, unspecified: Secondary | ICD-10-CM

## 2020-01-16 DIAGNOSIS — G8929 Other chronic pain: Secondary | ICD-10-CM | POA: Diagnosis not present

## 2020-01-16 DIAGNOSIS — S0083XA Contusion of other part of head, initial encounter: Secondary | ICD-10-CM

## 2020-01-16 DIAGNOSIS — M25511 Pain in right shoulder: Secondary | ICD-10-CM | POA: Diagnosis not present

## 2020-01-16 DIAGNOSIS — E1169 Type 2 diabetes mellitus with other specified complication: Secondary | ICD-10-CM | POA: Diagnosis not present

## 2020-01-16 DIAGNOSIS — Z1211 Encounter for screening for malignant neoplasm of colon: Secondary | ICD-10-CM | POA: Diagnosis not present

## 2020-01-16 DIAGNOSIS — E1165 Type 2 diabetes mellitus with hyperglycemia: Secondary | ICD-10-CM | POA: Diagnosis not present

## 2020-01-16 LAB — BAYER DCA HB A1C WAIVED: HB A1C (BAYER DCA - WAIVED): 6.8 % (ref <7.0)

## 2020-01-16 MED ORDER — NAPROXEN 500 MG PO TABS: 500.0000 mg | ORAL_TABLET | Freq: Two times a day (BID) | ORAL | 5 refills | Status: DC | PRN

## 2020-01-16 NOTE — Patient Instructions (Signed)
You had labs performed today.  You will be contacted with the results of the labs once they are available, usually in the next 3 business days for routine lab work.  If you have an active my chart account, they will be released to your MyChart.  If you prefer to have these labs released to you via telephone, please let us know.  If you had a pap smear or biopsy performed, expect to be contacted in about 7-10 days.  Referral to ortho about the shoulder placed.  Might offer shoulder injection.

## 2020-01-16 NOTE — Progress Notes (Signed)
Subjective: CC: Follow-up type 2 diabetes, hypertension and hyperlipidemia PCP: [name], DO TDD:UKGUR Debra Miranda is a 74 y.o. female presenting to clinic today for:  1. Type 2 Diabetes with hypertension, hyperlipidemia:  Patient reports compliance with her Cruzita Lederer, Celesta Gentile, Lipitor.  She is currently injecting 12-14 units daily. Last eye exam: UTD Last foot exam: needs Last A1c:  Lab Results  Component Value Date   HGBA1C 6.9 09/15/2019   Nephropathy screen indicated?: needs Last flu, zoster and/or pneumovax:  Immunization History  Administered Date(s) Administered  . Fluad Quad(high Dose 65+) 11/15/2018, 11/22/2019  . Influenza, High Dose Seasonal PF 11/29/2015, 11/18/2016, 12/06/2017  . Influenza,inj,Quad PF,6+ Mos 12/07/2013, 11/12/2014  . PFIZER SARS-COV-2 Vaccination 04/16/2019, 05/16/2019  . Pneumococcal Conjugate-13 03/17/2013  . Pneumococcal Polysaccharide-23 07/29/2017, 02/09/2018    2.  Fall Patient reports a fall where she went around a corner too quickly and ended up hitting her right side of the face.  She reports ecchymosis seems to be improving but did not seek care.  Denies any ongoing balance problems, visual disturbance, facial pain  3.  Chronic right shoulder pain Patient with ongoing chronic right shoulder pain.  She takes oral NSAIDs but this does not seem to help significantly.  She has pain with abduction of the shoulder.  ROS: Per HPI  Allergies  Allergen Reactions  . Latex Itching   Past Medical History:  Diagnosis Date  . Arthritis    right hand  . Cataract    RIGHT EYE; now removed  . Depression    many years ago, no meds  . Diabetes mellitus without complication ([place])    type 2  . Endometrial adenocarcinoma ([place]) 01/05/2018   treated with HDR  . Environmental allergies   . Family history of breast cancer   . Family history of kidney cancer   . Family history of pancreatic cancer   . Family history of stomach  cancer   . Family history of uterine cancer   . Hemorrhoids   . Nodular basal cell carcinoma ([facility]) 08/2017   LEFT LOWER LEG above foot  . Orthostatic hypotension    Patient is unaware of this diagnosis    Current Outpatient Medications:  .  atorvastatin (LIPITOR) 20 MG tablet, Take 1 tablet (20 mg total) by mouth daily., Disp: 90 tablet, Rfl: 3 .  Blood Glucose Monitoring Suppl (ACCU-CHEK GUIDE) w/Device KIT, Use to test blood sugar up to 4 times daily. DX: E11.9, Disp: 1 kit, Rfl: 0 .  Calcium Carbonate-Vit D-Min (CALCIUM 1200 PO), Take 1 tablet by mouth at bedtime. , Disp: , Rfl:  .  cetirizine (ZYRTEC) 10 MG tablet, Take 10 mg by mouth daily., Disp: , Rfl:  .  Cholecalciferol (VITAMIN D) 2000 UNITS CAPS, Take 2,000 Units by mouth at bedtime. , Disp: , Rfl:  .  dapagliflozin propanediol (FARXIGA) 10 MG TABS tablet, Take 10 mg by mouth daily., Disp: , Rfl:  .  diclofenac Sodium (VOLTAREN) 1 % GEL, Apply 2-4 g topically 4 (four) times daily. If not covered by ins, please help her find the OTC version, Disp: 200 g, Rfl: 3 .  escitalopram (LEXAPRO) 10 MG tablet, Take 1 tablet (10 mg total) by mouth daily., Disp: 90 tablet, Rfl: 3 .  Ferrous Sulfate (IRON) 90 (18 Fe) MG TABS, Take by mouth., Disp: , Rfl:  .  glucose blood (ACCU-CHEK GUIDE) test strip, Use to test blood sugar up to 4 times daily. DX: E11.9, Disp: 100 each,  Rfl: 12 .  Insulin Glargine (BASAGLAR KWIKPEN) 100 UNIT/ML, Inject 20 Units into the skin at bedtime. Sample #1pen LOT [id number], EXP 8/21, Disp: 15 mL, Rfl: 3 .  Insulin Pen Needle (BD PEN NEEDLE NANO 2ND GEN) 32G X 4 MM MISC, Use to inject insulin daily as directed. DX: E11.9, Disp: 100 each, Rfl: 3 .  Multiple Vitamins-Minerals (ONE-A-DAY VITACRAVES) CHEW, Chew by mouth., Disp: , Rfl:  .  naproxen (NAPROSYN) 500 MG tablet, Take 1 tablet (500 mg total) by mouth 2 (two) times daily as needed (joint pain)., Disp: 60 tablet, Rfl: 5 .  nystatin (MYCOSTATIN/NYSTOP) powder, Apply  topically 2 (two) times daily., Disp: 60 g, Rfl: 2 .  OneTouch Delica Lancets 70W MISC, Use to test blood sugar daily as directed.  DX: E11.9, Disp: 100 each, Rfl: 3 .  Pumpkin Seed-Soy Germ (AZO BLADDER CONTROL/GO-LESS PO), Take by mouth., Disp: , Rfl:  .  sitaGLIPtin (JANUVIA) 100 MG tablet, Take 100 mg by mouth at bedtime. , Disp: , Rfl:  Social History   Socioeconomic History  . Marital status: Soil scientist    Spouse name: [name]  . Number of children: 1  . Years of education: 59  . Highest education level: Associate degree: occupational, Hotel manager, or vocational program  Occupational History  . Occupation: Retired    Comment: [employer]  Tobacco Use  . Smoking status: Current Every Day Smoker    Years: 45.00    Types: Cigars  . Smokeless tobacco: Never Used  . Tobacco comment: 8 small cigars a day  Vaping Use  . Vaping Use: Former  . Start date: 02/16/2010  . Quit date: 02/17/2011  . Devices: unsure  Substance and Sexual Activity  . Alcohol use: Not Currently  . Drug use: No  . Sexual activity: Not Currently  Other Topics Concern  . Not on file  Social History Narrative  . Not on file   Social Determinants of Health   Financial Resource Strain:   . Difficulty of Paying Living Expenses: Not on file  Food Insecurity:   . Worried About Charity fundraiser in the Last Year: Not on file  . Ran Out of Food in the Last Year: Not on file  Transportation Needs:   . Lack of Transportation (Medical): Not on file  . Lack of Transportation (Non-Medical): Not on file  Physical Activity:   . Days of Exercise per Week: Not on file  . Minutes of Exercise per Session: Not on file  Stress:   . Feeling of Stress : Not on file  Social Connections:   . Frequency of Communication with Friends and Family: Not on file  . Frequency of Social Gatherings with Friends and Family: Not on file  . Attends Religious Services: Not on file  . Active Member of Clubs or Organizations: Not  on file  . Attends Archivist Meetings: Not on file  . Marital Status: Not on file  Intimate Partner Violence:   . Fear of Current or Ex-Partner: Not on file  . Emotionally Abused: Not on file  . Physically Abused: Not on file  . Sexually Abused: Not on file   Family History  Problem Relation Age of Onset  . Cervical cancer Mother 45       treated with radiation  . Pancreatic cancer Mother 35  . Stroke Father   . Pulmonary fibrosis Brother   . Alzheimer's disease Brother   . Stomach cancer Brother  dx 41s  . Diabetes Brother   . Lung cancer Maternal Grandmother   . Stroke Paternal Grandfather   . Uterine cancer Maternal Aunt        dx 27s  . Kidney cancer Maternal Uncle        dx 66s  . Colon cancer Maternal Uncle        dx 56s  . Breast cancer Cousin        maternal cousin dx 25    Objective: Office vital signs reviewed. BP 131/76   Pulse 87   Temp (!) 97.4 F (36.3 Debra) (Temporal)   Ht 5' 7.5" (1.715 m)   Wt 167 lb (75.8 kg)   SpO2 94%   BMI 25.77 kg/m   Physical Examination:  General: Awake, alert, no acute distress HEENT: Large ecchymosis noted along the right side of the face.  No tenderness palpation over the temple, zygomatic process or orbit.  She has a small scleral bleed that appears to be resolving. Cardio: regular rate and rhythm, S1S2 heard, no murmurs appreciated Pulm: clear to auscultation bilaterally, no wheezes, rhonchi or rales; normal work of breathing on room air Extremities: warm, well perfused, No edema, cyanosis or clubbing; +2 pulses bilaterally MSK: Gait independent but takes a minute to balance herself from position changes.  Painful arc sign noted on the right Skin: dry; intact; no rashes or lesions Neuro: No focal neurologic deficits  Diabetic Foot Exam - Simple   Simple Foot Form Diabetic Foot exam was performed with the following findings: Yes 01/16/2020  3:59 PM  Visual Inspection No deformities, no ulcerations, no  other skin breakdown bilaterally: Yes Sensation Testing Intact to touch and monofilament testing bilaterally: Yes Pulse Check Posterior Tibialis and Dorsalis pulse intact bilaterally: Yes Comments     Assessment/ Plan: 74 y.o. female   Controlled type 2 diabetes mellitus with other specified complication, without long-term current use of insulin (HCC) - Plan: Bayer DCA Hb A1c Waived, CMP14+EGFR, Microalbumin / creatinine urine ratio, CANCELED: Microalbumin / creatinine urine ratio  Hyperlipidemia associated with type 2 diabetes mellitus (HCC)  Chronic right shoulder pain - Plan: Ambulatory referral to Orthopedic Surgery  Colon cancer screening - Plan: Cologuard  Sugar remains controlled.  A1c at 6.8 today.  Continue current regimen. Continue statin Referral to orthopedics placed.  She would like to see the orthopedist that comes to our office  No red flags on today's exam.  I did advise her that in the future if she falls and sustained significant injury she needs to be evaluated emergency department as she does have risk of developing an intracranial bleed.  We discussed red flag signs and symptoms warranting further evaluation.  She was good understanding of follow-up  Colon cancer screening ordered.  Plan for Cologuard.  Advised to return as soon as possible  Orders Placed This Encounter  Procedures  . Bayer DCA Hb A1c Waived  . Microalbumin / creatinine urine ratio  . CMP14+EGFR   No orders of the defined types were placed in this encounter.    [name], DO [place] [phone]

## 2020-01-17 ENCOUNTER — Other Ambulatory Visit: Payer: Medicare PPO

## 2020-01-17 DIAGNOSIS — E1169 Type 2 diabetes mellitus with other specified complication: Secondary | ICD-10-CM | POA: Diagnosis not present

## 2020-01-17 LAB — CMP14+EGFR
ALT: 17 IU/L (ref 0–32)
AST: 15 IU/L (ref 0–40)
Albumin/Globulin Ratio: 1.7 (ref 1.2–2.2)
Albumin: 4.4 g/dL (ref 3.7–4.7)
Alkaline Phosphatase: 124 IU/L — ABNORMAL HIGH (ref 44–121)
BUN/Creatinine Ratio: 18 (ref 12–28)
BUN: 11 mg/dL (ref 8–27)
Bilirubin Total: 0.3 mg/dL (ref 0.0–1.2)
CO2: 23 mmol/L (ref 20–29)
Calcium: 9 mg/dL (ref 8.7–10.3)
Chloride: 104 mmol/L (ref 96–106)
Creatinine, Ser: 0.61 mg/dL (ref 0.57–1.00)
GFR calc Af Amer: 105 mL/min/{1.73_m2} (ref >59)
GFR calc non Af Amer: 91 mL/min/{1.73_m2} (ref >59)
Globulin, Total: 2.6 g/dL (ref 1.5–4.5)
Glucose: 173 mg/dL — ABNORMAL HIGH (ref 65–99)
Potassium: 4.1 mmol/L (ref 3.5–5.2)
Sodium: 139 mmol/L (ref 134–144)
Total Protein: 7 g/dL (ref 6.0–8.5)

## 2020-01-17 NOTE — Addendum Note (Signed)
Addended by: Liliane Bade on: 01/17/2020 10:05 AM   Modules accepted: Orders

## 2020-01-18 LAB — MICROALBUMIN / CREATININE URINE RATIO
Creatinine, Urine: 54.3 mg/dL
Microalb/Creat Ratio: 38 mg/g creat — ABNORMAL HIGH (ref 0–29)
Microalbumin, Urine: 20.8 ug/mL

## 2020-01-19 ENCOUNTER — Telehealth: Payer: Self-pay | Admitting: Family Medicine

## 2020-01-22 ENCOUNTER — Ambulatory Visit: Payer: Self-pay | Admitting: Pharmacist

## 2020-01-24 DIAGNOSIS — R339 Retention of urine, unspecified: Secondary | ICD-10-CM | POA: Diagnosis not present

## 2020-02-02 ENCOUNTER — Other Ambulatory Visit: Payer: Self-pay | Admitting: Family Medicine

## 2020-02-02 DIAGNOSIS — E785 Hyperlipidemia, unspecified: Secondary | ICD-10-CM

## 2020-02-06 ENCOUNTER — Telehealth: Payer: Self-pay

## 2020-02-06 DIAGNOSIS — Z1211 Encounter for screening for malignant neoplasm of colon: Secondary | ICD-10-CM | POA: Diagnosis not present

## 2020-02-06 DIAGNOSIS — Z1212 Encounter for screening for malignant neoplasm of rectum: Secondary | ICD-10-CM | POA: Diagnosis not present

## 2020-02-06 NOTE — Telephone Encounter (Signed)
Call returned to patient appt scheduled for tom @2pm 

## 2020-02-07 ENCOUNTER — Ambulatory Visit: Payer: Self-pay

## 2020-02-20 LAB — EXTERNAL GENERIC LAB PROCEDURE: COLOGUARD: NEGATIVE

## 2020-02-20 LAB — COLOGUARD: COLOGUARD: NEGATIVE

## 2020-02-21 ENCOUNTER — Other Ambulatory Visit: Payer: Self-pay | Admitting: Family Medicine

## 2020-02-23 ENCOUNTER — Other Ambulatory Visit: Payer: Self-pay | Admitting: Family Medicine

## 2020-02-26 LAB — COLOGUARD: Cologuard: NEGATIVE

## 2020-02-26 NOTE — Progress Notes (Signed)
Returning nurse call about labs.

## 2020-02-29 ENCOUNTER — Telehealth: Payer: Self-pay | Admitting: Family Medicine

## 2020-04-16 DIAGNOSIS — H5211 Myopia, right eye: Secondary | ICD-10-CM | POA: Diagnosis not present

## 2020-04-16 DIAGNOSIS — E119 Type 2 diabetes mellitus without complications: Secondary | ICD-10-CM | POA: Diagnosis not present

## 2020-04-16 DIAGNOSIS — H5202 Hypermetropia, left eye: Secondary | ICD-10-CM | POA: Diagnosis not present

## 2020-04-16 DIAGNOSIS — D485 Neoplasm of uncertain behavior of skin: Secondary | ICD-10-CM | POA: Diagnosis not present

## 2020-04-16 LAB — HM DIABETES EYE EXAM

## 2020-04-24 DIAGNOSIS — R339 Retention of urine, unspecified: Secondary | ICD-10-CM | POA: Diagnosis not present

## 2020-05-03 ENCOUNTER — Other Ambulatory Visit: Payer: Self-pay | Admitting: Ophthalmology

## 2020-05-03 DIAGNOSIS — D485 Neoplasm of uncertain behavior of skin: Secondary | ICD-10-CM | POA: Diagnosis not present

## 2020-05-03 DIAGNOSIS — C44329 Squamous cell carcinoma of skin of other parts of face: Secondary | ICD-10-CM | POA: Diagnosis not present

## 2020-05-13 ENCOUNTER — Ambulatory Visit
Admission: RE | Admit: 2020-05-13 | Discharge: 2020-05-13 | Disposition: A | Discharge status: Home / Self Care | Payer: Medicare PPO | Source: Ambulatory Visit | Attending: Radiation Oncology | Admitting: Radiation Oncology

## 2020-05-13 ENCOUNTER — Encounter: Payer: Self-pay | Admitting: Radiation Oncology

## 2020-05-13 ENCOUNTER — Other Ambulatory Visit: Payer: Self-pay

## 2020-05-13 VITALS — BP 141/87 | HR 81 | Temp 97.9°F | Resp 20 | Ht 68.0 in | Wt 173.0 lb

## 2020-05-13 DIAGNOSIS — Z923 Personal history of irradiation: Secondary | ICD-10-CM | POA: Diagnosis not present

## 2020-05-13 DIAGNOSIS — Z8542 Personal history of malignant neoplasm of other parts of uterus: Secondary | ICD-10-CM | POA: Diagnosis not present

## 2020-05-13 DIAGNOSIS — Z79899 Other long term (current) drug therapy: Secondary | ICD-10-CM | POA: Diagnosis not present

## 2020-05-13 DIAGNOSIS — Z08 Encounter for follow-up examination after completed treatment for malignant neoplasm: Secondary | ICD-10-CM | POA: Diagnosis not present

## 2020-05-13 DIAGNOSIS — C541 Malignant neoplasm of endometrium: Secondary | ICD-10-CM

## 2020-05-13 NOTE — Progress Notes (Signed)
Radiation Oncology         [phone] ________________________________  Name: Debra Miranda MRN: [id number]  Date: 05/13/2020  DOB: 12/01/1945  Follow-Up Visit Note  CC: [name], DO  [name], DO    ICD-10-CM   1. Endometrial adenocarcinoma (HCC)  C54.1     Diagnosis: FIGO grade 2, Stage IA, Endometrial adenocarcinoma (HCC)  Interval Since Last Radiation: Two years, one week, and three days  04/05/2018, 04/12/2018, 04/19/2018, 04/26/2018, 05/04/2018: Vagina, Iridium HDR, 3 cm cylinder, tx length of 3 cm / 30 Gy in 5 fractions.  Narrative: The patient returns today for routine follow-up.  Interval history is significant for the patient undergoing emergency surgery for incarcerated bowel.  This surgery was successful and the patient denies any pain along the abdominal region at this time.  Patient did miss her follow-up appointment with gynecologic oncology.  On review of systems, she reports no pelvic pain or vaginal bleeding.  She denies any vaginal discharge or urinary symptoms.  She does have chronic urinary incontinence.  She denies any rectal bleeding or problems with diarrhea.. She is  using her vaginal dilator approximately once a month.  She reports occasional vaginal itching  Allergies:  is allergic to latex.  Meds: Current Outpatient Medications  Medication Sig Dispense Refill  . atorvastatin (LIPITOR) 20 MG tablet TAKE 1 TABLET BY MOUTH EVERY DAY 90 tablet 3  . Calcium Carbonate-Vit D-Min (CALCIUM 1200 PO) Take 1 tablet by mouth at bedtime.     . cetirizine (ZYRTEC) 10 MG tablet Take 10 mg by mouth daily.    . Cholecalciferol (VITAMIN D) 2000 UNITS CAPS Take 2,000 Units by mouth at bedtime.     . dapagliflozin propanediol (FARXIGA) 10 MG TABS tablet Take 10 mg by mouth daily.    . diclofenac Sodium (VOLTAREN) 1 % GEL Apply 2-4 g topically 4 (four) times daily. If not covered by ins, please help her find the OTC version 200 g 3  . escitalopram  (LEXAPRO) 10 MG tablet TAKE 1 TABLET BY MOUTH EVERY DAY 90 tablet 3  . Ferrous Sulfate (IRON) 90 (18 Fe) MG TABS Take by mouth.    . Insulin Glargine (BASAGLAR KWIKPEN) 100 UNIT/ML Inject 20 Units into the skin at bedtime. Sample #1pen LOT [id number], EXP 8/21 15 mL 3  . Insulin Pen Needle (BD PEN NEEDLE NANO 2ND GEN) 32G X 4 MM MISC Use to inject insulin daily as directed. DX: E11.9 100 each 3  . JANUVIA 100 MG tablet TAKE 1 TABLET BY MOUTH EVERY EVENING 90 tablet 2  . naproxen (NAPROSYN) 500 MG tablet Take 1 tablet (500 mg total) by mouth 2 (two) times daily as needed (joint pain). 60 tablet 5  . Blood Glucose Monitoring Suppl (ACCU-CHEK GUIDE) w/Device KIT Use to test blood sugar up to 4 times daily. DX: E11.9 (Patient not taking: Reported on 05/13/2020) 1 kit 0  . glucose blood (ACCU-CHEK GUIDE) test strip Use to test blood sugar up to 4 times daily. DX: E11.9 (Patient not taking: Reported on 05/13/2020) 100 each 12  . Multiple Vitamins-Minerals (ONE-A-DAY VITACRAVES) CHEW Chew by mouth. (Patient not taking: Reported on 05/13/2020)    . nystatin (MYCOSTATIN/NYSTOP) powder Apply topically 2 (two) times daily. (Patient not taking: Reported on 05/13/2020) 60 g 2  . OneTouch Delica Lancets 00F MISC Use to test blood sugar daily as directed.  DX: E11.9 (Patient not taking: Reported on 05/13/2020) 100 each 3  . Pumpkin Seed-Soy Germ (AZO  BLADDER CONTROL/GO-LESS PO) Take by mouth. (Patient not taking: Reported on 05/13/2020)     No current facility-administered medications for this encounter.    Physical Findings: The patient is in no acute distress. Patient is alert and oriented.  height is _0  (1.727 m) and weight is 173 lb (78.5 kg). Her temperature is 97.9 F (36.6 C). Her blood pressure is 141/87 (abnormal) and her pulse is 81. Her respiration is 20 and oxygen saturation is 97%.   Lungs are clear to auscultation bilaterally. Heart has regular rate and rhythm. No palpable cervical, supraclavicular,  or axillary adenopathy. Abdomen soft, non-tender, normal bowel sounds.  On pelvic examination the external genitalia were unremarkable. A speculum exam was performed. There are no mucosal lesions noted in the vaginal vault. On bimanual  examination there were no pelvic masses appreciated. Vaginal cuff intact.  Some radiation changes noted at the vaginal cuff.    Lab Findings: Lab Results  Component Value Date   WBC 6.6 06/03/2019   HGB 13.1 06/03/2019   HCT 40.5 06/03/2019   MCV 91.8 06/03/2019   PLT 95 (L) 06/03/2019    Radiographic Findings: No results found.  Impression:  FIGO grade 2, Stage IA, Endometrial adenocarcinoma   No evidence of recurrence on clinical exam today.   Plan: The patient will follow-up with Dr. Berline Lopes in 6 months since she is now 2 years out from her vaginal brachytherapy. she will follow-up with radiation oncology in 1 year. .  Total time spent in this encounter was 20 minutes which included reviewing the patient's most recent interval history, physical examination, and documentation.   ____________________________________   Blair Promise, PhD, MD  This document serves as a record of services personally performed by Gery Pray, MD. It was created on his behalf by Clerance Lav, a trained medical scribe. The creation of this record is based on the scribe's personal observations and the provider's statements to them. This document has been checked and approved by the attending provider.

## 2020-05-13 NOTE — Progress Notes (Signed)
She is currently in no pain.  Patient complains of None. Reports urinary retention .    Patient states they urinate incontinent at night.   Patient reports Heartburn, Change in stool habits (improved and easier to have bowel movement) and Constipation, a bowel movement every 1-2 days.   Patient reports Vaginal itching, Urinary incontinence.    Patient is using their vaginal dilator once monthly.  Vitals:   05/13/20 1511  BP: (!) 141/87  Pulse: 81  Resp: 20  Temp: 97.9 F (36.6 C)  SpO2: 97%  Weight: 173 lb (78.5 kg)  Height: 5\' 8"  (1.727 m)

## 2020-06-13 DIAGNOSIS — C441222 Squamous cell carcinoma of skin of right lower eyelid, including canthus: Secondary | ICD-10-CM | POA: Diagnosis not present

## 2020-07-09 ENCOUNTER — Ambulatory Visit (INDEPENDENT_AMBULATORY_CARE_PROVIDER_SITE_OTHER): Payer: Medicare PPO | Admitting: Family Medicine

## 2020-07-09 DIAGNOSIS — Z5329 Procedure and treatment not carried out because of patient's decision for other reasons: Secondary | ICD-10-CM

## 2020-07-09 DIAGNOSIS — Z91199 Patient's noncompliance with other medical treatment and regimen due to unspecified reason: Secondary | ICD-10-CM

## 2020-07-09 NOTE — Progress Notes (Signed)
Telephone visit  No answer. Multiple attempts to reach.  LVM x2.  Patient to reschedule if still needs appt.  Start time: called 11:58am (LVM), 12:10pm, 12:49pm; 1:52pm; 2:51pm (LVM)

## 2020-07-10 ENCOUNTER — Encounter: Payer: Self-pay | Admitting: Nurse Practitioner

## 2020-07-10 ENCOUNTER — Ambulatory Visit: Payer: Medicare PPO | Admitting: Nurse Practitioner

## 2020-07-10 DIAGNOSIS — J0110 Acute frontal sinusitis, unspecified: Secondary | ICD-10-CM

## 2020-07-10 DIAGNOSIS — Z20822 Contact with and (suspected) exposure to covid-19: Secondary | ICD-10-CM

## 2020-07-10 MED ORDER — SALINE SPRAY 0.65 % NA SOLN: 1.0000 | NASAL | 2 refills | Status: DC | PRN

## 2020-07-10 MED ORDER — AMOXICILLIN-POT CLAVULANATE 875-125 MG PO TABS: 1.0000 | ORAL_TABLET | Freq: Two times a day (BID) | ORAL | 0 refills | Status: DC

## 2020-07-10 NOTE — Patient Instructions (Signed)

## 2020-07-10 NOTE — Progress Notes (Signed)
   Virtual Visit  Note Due to COVID-19 pandemic this visit was conducted virtually. This visit type was conducted due to national recommendations for restrictions regarding the COVID-19 Pandemic (e.g. social distancing, sheltering in place) in an effort to limit this patient's exposure and mitigate transmission in our community. All issues noted in this document were discussed and addressed.  A physical exam was not performed with this format.  I connected with Debra Miranda on 07/11/20 at  10 AM by telephone and verified that I am speaking with the correct person using two identifiers. Debra Miranda is currently located at at home the provider, Ivy Lynn, NP is located in their office at time of visit.  I discussed the limitations, risks, security and privacy concerns of performing an evaluation and management service by telephone and the availability of in person appointments. I also discussed with the patient that there may be a patient responsible charge related to this service. The patient expressed understanding and agreed to proceed.   History and Present Illness:  Sinusitis This is a new problem. Episode onset: The past 3 to 4 days. The problem has been gradually worsening since onset. There has been no fever. Associated symptoms include chills, congestion and headaches. Pertinent negatives include no coughing, ear pain or neck pain. Past treatments include acetaminophen. The treatment provided mild relief.      Review of Systems  Constitutional: Positive for chills.  HENT: Positive for congestion. Negative for ear pain.   Respiratory: Negative for cough.   Musculoskeletal: Negative for neck pain.  Neurological: Positive for headaches.     Observations/Objective: Televisit patient did not sound to be in distress  Assessment and Plan:  Completed COVID-19 swab results pending. Augmentin 875-125 mg tablet twice daily Ocean Spray Tylenol for headache/fever Increase  hydration  Follow Up Instructions: Follow-up with worsening unresolved symptoms.    I discussed the assessment and treatment plan with the patient. The patient was provided an opportunity to ask questions and all were answered. The patient agreed with the plan and demonstrated an understanding of the instructions.   The patient was advised to call back or seek an in-person evaluation if the symptoms worsen or if the condition fails to improve as anticipated.  The above assessment and management plan was discussed with the patient. The patient verbalized understanding of and has agreed to the management plan. Patient is aware to call the clinic if symptoms persist or worsen. Patient is aware when to return to the clinic for a follow-up visit. Patient educated on when it is appropriate to go to the emergency department.   Time call ended: 10:10 AM  I provided 10 minutes of  non face-to-face time during this encounter.    Ivy Lynn, NP

## 2020-07-11 LAB — NOVEL CORONAVIRUS, NAA: SARS-CoV-2, NAA: NOT DETECTED

## 2020-07-11 LAB — SARS-COV-2, NAA 2 DAY TAT

## 2020-07-11 NOTE — Assessment & Plan Note (Signed)
Completed COVID-19 swab-results pending.  Education provided to patient to monitor for COVID-19 symptoms and symptom management.  Follow-up with worsening or unresolved symptoms.

## 2020-07-11 NOTE — Assessment & Plan Note (Signed)
Unresolved sinusitis. Completed COVID-19 swab results pending. Augmentin 875-125 mg tablet twice daily Ocean Spray Tylenol for headache/fever Increase hydration

## 2020-07-12 ENCOUNTER — Telehealth: Payer: Self-pay | Admitting: Family Medicine

## 2020-07-12 NOTE — Telephone Encounter (Signed)
Patient aware and verbalized understanding. °

## 2020-07-12 NOTE — Telephone Encounter (Signed)
Pt would like to know her results from [place] test because she has surgery on Tuesday 5/31 and needs to know the results before they will go through with the surgery

## 2020-07-14 ENCOUNTER — Other Ambulatory Visit: Payer: Self-pay | Admitting: Family Medicine

## 2020-07-16 DIAGNOSIS — C441222 Squamous cell carcinoma of skin of right lower eyelid, including canthus: Secondary | ICD-10-CM | POA: Diagnosis not present

## 2020-07-22 ENCOUNTER — Ambulatory Visit (INDEPENDENT_AMBULATORY_CARE_PROVIDER_SITE_OTHER): Payer: Medicare PPO

## 2020-07-22 VITALS — Ht 68.0 in | Wt 173.0 lb

## 2020-07-22 DIAGNOSIS — Z0000 Encounter for general adult medical examination without abnormal findings: Secondary | ICD-10-CM | POA: Diagnosis not present

## 2020-07-22 DIAGNOSIS — Z1231 Encounter for screening mammogram for malignant neoplasm of breast: Secondary | ICD-10-CM

## 2020-07-22 NOTE — Progress Notes (Signed)
Subjective:   Debra Miranda is a 75 y.o. female who presents for Medicare Annual (Subsequent) preventive examination.  Virtual Visit via Telephone Note  I connected with  Debra Miranda on 07/22/20 at  4:15 PM EDT by telephone and verified that I am speaking with the correct person using two identifiers.  Location: Patient: Home Provider: [facility] Persons participating in the virtual visit: patient/Nurse Health Advisor   I discussed the limitations, risks, security and privacy concerns of performing an evaluation and management service by telephone and the availability of in person appointments. The patient expressed understanding and agreed to proceed.  Interactive audio and video telecommunications were attempted between this nurse and patient, however failed, due to patient having technical difficulties OR patient did not have access to video capability.  We continued and completed visit with audio only.  Some vital signs may be absent or patient reported.   Emilian Stawicki E Jordann Grime, LPN   Review of Systems     Cardiac Risk Factors include: advanced age (>45mn, >>42women);diabetes mellitus;dyslipidemia;smoking/ tobacco exposure;hypertension     Objective:    Today's Vitals   07/22/20 1641  Weight: 173 lb (78.5 kg)  Height: _0  (1.727 m)   Body mass index is 26.3 kg/m.  Advanced Directives 05/13/2020 11/13/2019 06/01/2019 06/01/2019 05/08/2019 11/17/2018 11/07/2018  Does Patient Have a Medical Advance Directive? Yes Yes No Yes Yes Yes Yes  Type of Advance Directive Living will Living will - [name] will  Does patient want to make changes to medical advance directive? No - Patient declined Yes (ED - Information included in AVS) - - - No - Patient declined -  Copy of [place]? - - - - - Yes - validated most recent copy scanned in chart (See row information) -  Would  patient like information on creating a medical advance directive? - - No - Patient declined - - - -    Current Medications (verified) Outpatient Encounter Medications as of 07/22/2020  Medication Sig  . atorvastatin (LIPITOR) 20 MG tablet TAKE 1 TABLET BY MOUTH EVERY DAY  . BD PEN NEEDLE NANO 2ND GEN 32G X 4 MM MISC USE TO INJECT INSULIN DAILY AS DIRECTED. DX: E11.9  . Blood Glucose Monitoring Suppl (ACCU-CHEK GUIDE) w/Device KIT Use to test blood sugar up to 4 times daily. DX: E11.9  . Calcium Carbonate-Vit D-Min (CALCIUM 1200 PO) Take 1 tablet by mouth at bedtime.   . cetirizine (ZYRTEC) 10 MG tablet Take 10 mg by mouth daily.  . Cholecalciferol (VITAMIN D) 2000 UNITS CAPS Take 2,000 Units by mouth at bedtime.   . dapagliflozin propanediol (FARXIGA) 10 MG TABS tablet Take 10 mg by mouth daily.  . diclofenac Sodium (VOLTAREN) 1 % GEL Apply 2-4 g topically 4 (four) times daily. If not covered by ins, please help her find the OTC version  . escitalopram (LEXAPRO) 10 MG tablet TAKE 1 TABLET BY MOUTH EVERY DAY  . Ferrous Sulfate (IRON) 90 (18 Fe) MG TABS Take by mouth.  .Marland Kitchenglucose blood (ACCU-CHEK GUIDE) test strip Use to test blood sugar up to 4 times daily. DX: E11.9  . Insulin Glargine (BASAGLAR KWIKPEN) 100 UNIT/ML Inject 20 Units into the skin at bedtime. Sample #1pen LOT [id number] EXP 8/21  . JANUVIA 100 MG tablet TAKE 1 TABLET BY MOUTH EVERY EVENING  . Multiple Vitamins-Minerals (ONE-A-DAY VITACRAVES) CHEW Chew by mouth.  . naproxen (  NAPROSYN) 500 MG tablet Take 1 tablet (500 mg total) by mouth 2 (two) times daily as needed (joint pain).  . nystatin (MYCOSTATIN/NYSTOP) powder Apply topically 2 (two) times daily.  [name] Lancets 35T MISC Use to test blood sugar daily as directed.  DX: E11.9 (Patient taking differently: Use to test blood sugar daily as directed.  DX: E11.9)  . Pumpkin Seed-Soy Germ (AZO BLADDER CONTROL/GO-LESS PO) Take by mouth.  . sodium chloride (OCEAN) 0.65 %  SOLN nasal spray Place 1 spray into both nostrils as needed for congestion.  Marland Kitchen amoxicillin-clavulanate (AUGMENTIN) 875-125 MG tablet Take 1 tablet by mouth 2 (two) times daily. (Patient not taking: Reported on 07/22/2020)   No facility-administered encounter medications on file as of 07/22/2020.    Allergies (verified) Augmentin [amoxicillin-pot clavulanate] and Latex   History: Past Medical History:  Diagnosis Date  . Arthritis    right hand  . Cataract    RIGHT EYE; now removed  . Depression    many years ago, no meds  . Diabetes mellitus without complication ([place])    type 2  . Endometrial adenocarcinoma ([place]) 01/05/2018   treated with HDR  . Environmental allergies   . Family history of breast cancer   . Family history of kidney cancer   . Family history of pancreatic cancer   . Family history of stomach cancer   . Family history of uterine cancer   . Hemorrhoids   . History of radiation therapy 04/05/18,02/25,3/3,3/10,3/18   Radiation to pelvis HDR. Dr. Gery Pray   . Nodular basal cell carcinoma (BCC) 08/2017   LEFT LOWER LEG above foot  . Orthostatic hypotension    Patient is unaware of this diagnosis   Past Surgical History:  Procedure Laterality Date  . ABDOMINAL HYSTERECTOMY     Dr. Gerarda Fraction 02-08-18   . BOWEL RESECTION N/A 06/01/2019   Procedure: SMALL BOWEL RESECTION;  Surgeon: Virl Cagey, MD;  Location: [facility];  Service: General;  Laterality: N/A;  . CATARACT EXTRACTION     RIGHT EYE  . HYSTEROSCOPY WITH D & C N/A 01/05/2018   Procedure: DILATATION AND CURETTAGE/HYSTEROSCOPY;  Surgeon: Florian Buff, MD;  Location: [facility];  Service: Gynecology;  Laterality: N/A;  . LUMBAR [place] SURGERY  2006  . OMENTECTOMY N/A 06/01/2019   Procedure: OMENTECTOMY;  Surgeon: Virl Cagey, MD;  Location: [facility];  Service: General;  Laterality: N/A;  . POLYPECTOMY N/A 01/05/2018   Procedure: ENDOMETRIAL POLYPECTOMY;  Surgeon: Florian Buff, MD;  Location: [facility];   Service: Gynecology;  Laterality: N/A;  . ROBOTIC ASSISTED TOTAL HYSTERECTOMY WITH BILATERAL SALPINGO OOPHERECTOMY Bilateral 02/08/2018   Procedure: XI ROBOTIC ASSISTED TOTAL HYSTERECTOMY WITH BILATERAL SALPINGO OOPHORECTOMY;  Surgeon: Isabel Caprice, MD;  Location: [facility];  Service: Gynecology;  Laterality: Bilateral;  . SENTINEL NODE BIOPSY Bilateral 02/08/2018   Procedure: SENTINEL NODE BIOPSY AND PELVIC LYMPH NODE SAMPLING;  Surgeon: Isabel Caprice, MD;  Location: [facility];  Service: Gynecology;  Laterality: Bilateral;  . TONSILLECTOMY     as a child  . UMBILICAL HERNIA REPAIR N/A 06/01/2019   Procedure: HERNIA REPAIR UMBILICAL ADULT;  Surgeon: Virl Cagey, MD;  Location: [facility];  Service: General;  Laterality: N/A;   Family History  Problem Relation Age of Onset  . Cervical cancer Mother 24       treated with radiation  . Pancreatic cancer Mother 44  . Stroke Father   . Pulmonary fibrosis Brother   .  Alzheimer's disease Brother   . Stomach cancer Brother        dx 76s  . Diabetes Brother   . Lung cancer Maternal Grandmother   . Stroke Paternal Grandfather   . Uterine cancer Maternal Aunt        dx 47s  . Kidney cancer Maternal Uncle        dx 16s  . Colon cancer Maternal Uncle        dx 57s  . Breast cancer Cousin        maternal cousin dx 65   Social History   Socioeconomic History  . Marital status: Soil scientist    Spouse name: [name]  . Number of children: 1  . Years of education: 29  . Highest education level: Associate degree: occupational, Hotel manager, or vocational program  Occupational History  . Occupation: Retired    Comment: [employer]  Tobacco Use  . Smoking status: Current Every Day Smoker    Years: 45.00    Types: Cigars  . Smokeless tobacco: Never Used  . Tobacco comment: 8 small cigars a day  Vaping Use  . Vaping Use: Former  . Start date: 02/16/2010  . Quit date: 02/17/2011  . Devices: unsure  Substance and Sexual Activity  .  Alcohol use: Not Currently  . Drug use: No  . Sexual activity: Not Currently  Other Topics Concern  . Not on file  Social History Narrative  . Not on file   Social Determinants of Health   Financial Resource Strain: Low Risk   . Difficulty of Paying Living Expenses: Not hard at all  Food Insecurity: No Food Insecurity  . Worried About Charity fundraiser in the Last Year: Never true  . Ran Out of Food in the Last Year: Never true  Transportation Needs: No Transportation Needs  . Lack of Transportation (Medical): No  . Lack of Transportation (Non-Medical): No  Physical Activity: Insufficiently Active  . Days of Exercise per Week: 7 days  . Minutes of Exercise per Session: 20 min  Stress: No Stress Concern Present  . Feeling of Stress : Not at all  Social Connections: Socially Integrated  . Frequency of Communication with Friends and Family: More than three times a week  . Frequency of Social Gatherings with Friends and Family: Three times a week  . Attends Religious Services: More than 4 times per year  . Active Member of Clubs or Organizations: Yes  . Attends Archivist Meetings: More than 4 times per year  . Marital Status: Living with partner    Tobacco Counseling Ready to quit: Not Answered Counseling given: Not Answered Comment: 8 small cigars a day   Clinical Intake:  Pre-visit preparation completed: Yes  Pain : No/denies pain     BMI - recorded: 26.3 Nutritional Status: BMI 25 -29 Overweight Nutritional Risks: Nausea/ vomitting/ diarrhea Diabetes: Yes CBG done?: No Did pt. bring in CBG monitor from home?: No  How often do you need to have someone help you when you read instructions, pamphlets, or other written materials from your doctor or pharmacy?: 1 - Never  Nutrition Risk Assessment:  Has the patient had any N/V/D within the last 2 months?  Yes  Does the patient have any non-healing wounds?  No  Has the patient had any unintentional  weight loss or weight gain?  No   Diabetes:  Is the patient diabetic?  Yes  If diabetic, was a CBG obtained today?  No  Did the patient bring in their glucometer from home?  No  How often do you monitor your CBG's? Maybe once a week.   Financial Strains and Diabetes Management:  Are you having any financial strains with the device, your supplies or your medication? No .  Does the patient want to be seen by Chronic Care Management for management of their diabetes?  No  Would the patient like to be referred to a Nutritionist or for Diabetic Management?  No   Diabetic Exams:  Diabetic Eye Exam: Completed 04/16/2020.   Diabetic Foot Exam: Completed 01/16/2020. Pt has been advised about the importance in completing this exam. Pt is scheduled for diabetic foot exam on 08/2020.    Interpreter Needed?: No  Information entered by :: Christ Fullenwider, LPN   Activities of Daily Living In your present state of health, do you have any difficulty performing the following activities: 07/22/2020  Hearing? N  Vision? N  Difficulty concentrating or making decisions? N  Walking or climbing stairs? N  Dressing or bathing? N  Doing errands, shopping? N  Preparing Food and eating ? N  Using the Toilet? N  In the past six months, have you accidently leaked urine? Y  Comment uses catheters and depends  Do you have problems with loss of bowel control? N  Managing your Medications? N  Managing your Finances? N  Housekeeping or managing your Housekeeping? N  Some recent data might be hidden    Patient Care Team: [name], DO as PCP - General (Family Medicine) Lavera Guise, [facility] (Pharmacist) Robley Fries, MD as Consulting Physician (Urology) Jola Schmidt, MD as Consulting Physician (Ophthalmology)  Indicate any recent Medical Services you may have received from other than [facility] providers in the past year (date may be approximate).     Assessment:   This is a routine wellness  examination for [facility].  Hearing/Vision screen  Hearing Screening   _0  _1  _2  _3  _4  _5  _6  _7  _8   Right ear:           Left ear:           Comments: Denies hearing difficulties   Vision Screening Comments: Wears reading glasses only - up to date with annual eye exam with Dr Valetta Close  Dietary issues and exercise activities discussed: Current Exercise Habits: Home exercise routine, Type of exercise: walking, Time (Minutes): 20, Frequency (Times/Week): 7, Weekly Exercise (Minutes/Week): 140, Intensity: Mild, Exercise limited by: orthopedic condition(s)  Goals Addressed            This Visit's Progress   . Exercise 150 min/wk Moderate Activity   On track     Depression Screen PHQ 2/9 Scores 07/22/2020 01/16/2020 09/15/2019 06/15/2019 03/31/2019 12/27/2018 11/17/2018  PHQ - 2 Score 0 0 0 3 0 0 0  PHQ- 9 Score - 0 - 10 0 0 -    Fall Risk Fall Risk  07/22/2020 01/16/2020 09/15/2019 09/15/2019 06/15/2019  Falls in the past year? _9 0 1  Number falls in past yr: 0 1 1 - 0  Injury with Fall? _10 - 0  Risk for fall due to : History of fall(s);Impaired vision History of fall(s);Impaired balance/gait History of fall(s) - Impaired balance/gait  Risk for fall due to: Comment - - - - -  Follow up Falls prevention discussed;Education provided Falls prevention discussed Falls evaluation completed - Education provided  Comment - - - - -    FALL RISK PREVENTION PERTAINING  TO THE HOME:  Any stairs in or around the home? Yes  If so, are there any without handrails? No  Home free of loose throw rugs in walkways, pet beds, electrical cords, etc? Yes  Adequate lighting in your home to reduce risk of falls? Yes   ASSISTIVE DEVICES UTILIZED TO PREVENT FALLS:  Life alert? No  Use of a cane, walker or w/c? Yes  Grab bars in the bathroom? Yes  Shower chair or bench in shower? Yes  Elevated toilet seat or a handicapped toilet? Yes   TIMED UP AND GO:  Was the test  performed? No . Telephonic visit  Cognitive Function: Normal cognitive status assessed by direct observation by this Nurse Health Advisor. No abnormalities found.   MMSE - Mini Mental State Exam 07/29/2017 07/27/2016  Orientation to time 5 5  Orientation to Place 5 5  Registration 3 3  Attention/ Calculation 5 5  Recall 3 3  Language- name 2 objects 2 2  Language- repeat 1 1  Language- follow 3 step command 3 3  Language- read & follow direction 1 1  Write a sentence 1 1  Copy design 1 1  Total score 30 30     6CIT Screen 11/17/2018  What Year? 0 points  What month? 0 points  What time? 0 points  Count back from 20 0 points  Months in reverse 0 points  Repeat phrase 0 points  Total Score 0    Immunizations Immunization History  Administered Date(s) Administered  . Fluad Quad(high Dose 65+) 11/15/2018, 11/22/2019  . Influenza, High Dose Seasonal PF 11/29/2015, 11/18/2016, 12/06/2017  . Influenza,inj,Quad PF,6+ Mos 12/07/2013, 11/12/2014  . PFIZER(Purple Top)SARS-COV-2 Vaccination 04/16/2019, 05/16/2019, 11/13/2019, 05/23/2020  . Pneumococcal Conjugate-13 03/17/2013  . Pneumococcal Polysaccharide-23 07/29/2017, 02/09/2018    TDAP status: Due, Education has been provided regarding the importance of this vaccine. Advised may receive this vaccine at local pharmacy or Health Dept. Aware to provide a copy of the vaccination record if obtained from local pharmacy or Health Dept. Verbalized acceptance and understanding.  Flu Vaccine status: Up to date  Pneumococcal vaccine status: Up to date  Covid-19 vaccine status: Completed vaccines  Qualifies for Shingles Vaccine? Yes   Zostavax completed No   Shingrix Completed?: No.    Education has been provided regarding the importance of this vaccine. Patient has been advised to call insurance company to determine out of pocket expense if they have not yet received this vaccine. Advised may also receive vaccine at local pharmacy or  Health Dept. Verbalized acceptance and understanding.  Screening Tests Health Maintenance  Topic Date Due  . Pneumococcal Vaccine 23-38 Years old (1 of 4 - PCV13) Never done  . TETANUS/TDAP  Never done  . Zoster Vaccines- Shingrix (1 of 2) Never done  . MAMMOGRAM  07/29/2018  . HEMOGLOBIN A1C  07/15/2020  . INFLUENZA VACCINE  09/16/2020  . FOOT EXAM  01/15/2021  . URINE MICROALBUMIN  01/16/2021  . DEXA SCAN  04/02/2021  . OPHTHALMOLOGY EXAM  04/16/2021  . Fecal DNA (Cologuard)  02/06/2023  . COVID-19 Vaccine  Completed  . Hepatitis C Screening  Completed  . PNA vac Low Risk Adult  Completed  . HPV VACCINES  Aged Out    Health Maintenance  Health Maintenance Due  Topic Date Due  . Pneumococcal Vaccine 31-72 Years old (1 of 4 - PCV13) Never done  . TETANUS/TDAP  Never done  . Zoster Vaccines- Shingrix (1 of 2) Never done  .  MAMMOGRAM  07/29/2018  . HEMOGLOBIN A1C  07/15/2020    Colorectal cancer screening: Type of screening: Cologuard. Completed 02/06/2020. Repeat every 3 years  Mammogram status: Ordered 07/22/20. Pt provided with contact info and advised to call to schedule appt.   Bone Density status: Completed 04/03/2019. Results reflect: Bone density results: NORMAL. Repeat every 2 years.  Lung Cancer Screening: (Low Dose CT Chest recommended if Age 36-80 years, 30 pack-year currently smoking OR have quit w/in 15years.) does not qualify.   Additional Screening:  Hepatitis C Screening: does qualify; Completed 12/06/2017  Vision Screening: Recommended annual ophthalmology exams for early detection of glaucoma and other disorders of the eye. Is the patient up to date with their annual eye exam?  Yes  Who is the provider or what is the name of the office in which the patient attends annual eye exams? [facility] If pt is not established with a provider, would they like to be referred to a provider to establish care? No .   Dental Screening: Recommended annual dental exams for  proper oral hygiene  Community Resource Referral / Chronic Care Management: CRR required this visit?  No   CCM required this visit?  No      Plan:     I have personally reviewed and noted the following in the patient's chart:   . Medical and social history . Use of alcohol, tobacco or illicit drugs  . Current medications and supplements including opioid prescriptions.  . Functional ability and status . Nutritional status . Physical activity . Advanced directives . List of other physicians . Hospitalizations, surgeries, and ER visits in previous 12 months . Vitals . Screenings to include cognitive, depression, and falls . Referrals and appointments  In addition, I have reviewed and discussed with patient certain preventive protocols, quality metrics, and best practice recommendations. A written personalized care plan for preventive services as well as general preventive health recommendations were provided to patient.     Sandrea Hammond, LPN   10/24/9534   Nurse Notes: none

## 2020-07-22 NOTE — Patient Instructions (Signed)
[name] , Thank you for taking time to come for your Medicare Wellness Visit. I appreciate your ongoing commitment to your health goals. Please review the following plan we discussed and let me know if I can assist you in the future.   Screening recommendations/referrals: Colonoscopy: Cologuard done 02/06/2020 - Repeat every 3 years Mammogram: Due - ordered today - Repeat every year Bone Density: Done 04/03/2019 - Repeat annually Recommended yearly ophthalmology/optometry visit for glaucoma screening and checkup Recommended yearly dental visit for hygiene and checkup  Vaccinations: Influenza vaccine: Done 11/22/2019 - Repeat annually Pneumococcal vaccine: 03/17/2013 & 07/29/2017 Tdap vaccine: Due Shingles vaccine: Due   Covid-19: Done 04/16/19, 05/16/19, 11/13/19, & 05/23/20  Conditions/risks identified: Aim for 30 minutes of exercise or brisk walking each day, drink 6-8 glasses of water and eat lots of fruits and vegetables. If you wish to quit smoking, help is available. For free tobacco cessation program offerings call the [facility] at 307-614-9909 or Live Well Line at 620 499 0512. You may also visit www.[place].com or email livelifewell@[place] .com for more information on other programs.   You may also call 1-800-QUIT-NOW 256-519-0635) or visit www.VirusCrisis.dk or www.BecomeAnEx.org for additional resources on smoking cessation.   Next appointment: Follow up in one year for your annual wellness visit    Preventive Care 65 Years and Older, Female Preventive care refers to lifestyle choices and visits with your health care provider that can promote health and wellness. What does preventive care include?  A yearly physical exam. This is also called an annual well check.  Dental exams once or twice a year.  Routine eye exams. Ask your health care provider how often you should have your eyes checked.  Personal lifestyle choices, including:  Daily care of  your teeth and gums.  Regular physical activity.  Eating a healthy diet.  Avoiding tobacco and drug use.  Limiting alcohol use.  Practicing safe sex.  Taking low-dose aspirin every day.  Taking vitamin and mineral supplements as recommended by your health care provider. What happens during an annual well check? The services and screenings done by your health care provider during your annual well check will depend on your age, overall health, lifestyle risk factors, and family history of disease. Counseling  Your health care provider may ask you questions about your:  Alcohol use.  Tobacco use.  Drug use.  Emotional well-being.  Home and relationship well-being.  Sexual activity.  Eating habits.  History of falls.  Memory and ability to understand (cognition).  Work and work Statistician.  Reproductive health. Screening  You may have the following tests or measurements:  Height, weight, and BMI.  Blood pressure.  Lipid and cholesterol levels. These may be checked every 5 years, or more frequently if you are over 42 years old.  Skin check.  Lung cancer screening. You may have this screening every year starting at age 53 if you have a 30-pack-year history of smoking and currently smoke or have quit within the past 15 years.  Fecal occult blood test (FOBT) of the stool. You may have this test every year starting at age 72.  Flexible sigmoidoscopy or colonoscopy. You may have a sigmoidoscopy every 5 years or a colonoscopy every 10 years starting at age 74.  Hepatitis C blood test.  Hepatitis B blood test.  Sexually transmitted disease (STD) testing.  Diabetes screening. This is done by checking your blood sugar (glucose) after you have not eaten for a while (fasting). You may  have this done every 1-3 years.  Bone density scan. This is done to screen for osteoporosis. You may have this done starting at age 49.  Mammogram. This may be done every 1-2 years.  Talk to your health care provider about how often you should have regular mammograms. Talk with your health care provider about your test results, treatment options, and if necessary, the need for more tests. Vaccines  Your health care provider may recommend certain vaccines, such as:  Influenza vaccine. This is recommended every year.  Tetanus, diphtheria, and acellular pertussis (Tdap, Td) vaccine. You may need a Td booster every 10 years.  Zoster vaccine. You may need this after age 75.  Pneumococcal 13-valent conjugate (PCV13) vaccine. One dose is recommended after age 65.  Pneumococcal polysaccharide (PPSV23) vaccine. One dose is recommended after age 87. Talk to your health care provider about which screenings and vaccines you need and how often you need them. This information is not intended to replace advice given to you by your health care provider. Make sure you discuss any questions you have with your health care provider. Document Released: 03/01/2015 Document Revised: 10/23/2015 Document Reviewed: 12/04/2014 Elsevier Interactive Patient Education  2017 [place] Prevention in the Home Falls can cause injuries. They can happen to people of all ages. There are many things you can do to make your home safe and to help prevent falls. What can I do on the outside of my home?  Regularly fix the edges of walkways and driveways and fix any cracks.  Remove anything that might make you trip as you walk through a door, such as a raised step or threshold.  Trim any bushes or trees on the path to your home.  Use bright outdoor lighting.  Clear any walking paths of anything that might make someone trip, such as rocks or tools.  Regularly check to see if handrails are loose or broken. Make sure that both sides of any steps have handrails.  Any raised decks and porches should have guardrails on the edges.  Have any leaves, snow, or ice cleared regularly.  Use sand or  salt on walking paths during winter.  Clean up any spills in your garage right away. This includes oil or grease spills. What can I do in the bathroom?  Use night lights.  Install grab bars by the toilet and in the tub and shower. Do not use towel bars as grab bars.  Use non-skid mats or decals in the tub or shower.  If you need to sit down in the shower, use a plastic, non-slip stool.  Keep the floor dry. Clean up any water that spills on the floor as soon as it happens.  Remove soap buildup in the tub or shower regularly.  Attach bath mats securely with double-sided non-slip rug tape.  Do not have throw rugs and other things on the floor that can make you trip. What can I do in the bedroom?  Use night lights.  Make sure that you have a light by your bed that is easy to reach.  Do not use any sheets or blankets that are too big for your bed. They should not hang down onto the floor.  Have a firm chair that has side arms. You can use this for support while you get dressed.  Do not have throw rugs and other things on the floor that can make you trip. What can I do in the kitchen?  Clean up  any spills right away.  Avoid walking on wet floors.  Keep items that you use a lot in easy-to-reach places.  If you need to reach something above you, use a strong step stool that has a grab bar.  Keep electrical cords out of the way.  Do not use floor polish or wax that makes floors slippery. If you must use wax, use non-skid floor wax.  Do not have throw rugs and other things on the floor that can make you trip. What can I do with my stairs?  Do not leave any items on the stairs.  Make sure that there are handrails on both sides of the stairs and use them. Fix handrails that are broken or loose. Make sure that handrails are as long as the stairways.  Check any carpeting to make sure that it is firmly attached to the stairs. Fix any carpet that is loose or worn.  Avoid having  throw rugs at the top or bottom of the stairs. If you do have throw rugs, attach them to the floor with carpet tape.  Make sure that you have a light switch at the top of the stairs and the bottom of the stairs. If you do not have them, ask someone to add them for you. What else can I do to help prevent falls?  Wear shoes that:  Do not have high heels.  Have rubber bottoms.  Are comfortable and fit you well.  Are closed at the toe. Do not wear sandals.  If you use a stepladder:  Make sure that it is fully opened. Do not climb a closed stepladder.  Make sure that both sides of the stepladder are locked into place.  Ask someone to hold it for you, if possible.  Clearly mark and make sure that you can see:  Any grab bars or handrails.  First and last steps.  Where the edge of each step is.  Use tools that help you move around (mobility aids) if they are needed. These include:  Canes.  Walkers.  Scooters.  Crutches.  Turn on the lights when you go into a dark area. Replace any light bulbs as soon as they burn out.  Set up your furniture so you have a clear path. Avoid moving your furniture around.  If any of your floors are uneven, fix them.  If there are any pets around you, be aware of where they are.  Review your medicines with your doctor. Some medicines can make you feel dizzy. This can increase your chance of falling. Ask your doctor what other things that you can do to help prevent falls. This information is not intended to replace advice given to you by your health care provider. Make sure you discuss any questions you have with your health care provider. Document Released: 11/29/2008 Document Revised: 07/11/2015 Document Reviewed: 03/09/2014 Elsevier Interactive Patient Education  2017 [employer].

## 2020-07-24 ENCOUNTER — Other Ambulatory Visit: Payer: Self-pay | Admitting: Nurse Practitioner

## 2020-07-24 ENCOUNTER — Telehealth: Payer: Self-pay | Admitting: Family Medicine

## 2020-07-24 MED ORDER — DOXYCYCLINE HYCLATE 100 MG PO TABS: 100.0000 mg | ORAL_TABLET | Freq: Two times a day (BID) | ORAL | 0 refills | Status: DC

## 2020-07-24 NOTE — Telephone Encounter (Signed)
Patient aware.

## 2020-07-26 DIAGNOSIS — R339 Retention of urine, unspecified: Secondary | ICD-10-CM | POA: Diagnosis not present

## 2020-08-07 ENCOUNTER — Telehealth: Payer: Self-pay

## 2020-08-07 ENCOUNTER — Telehealth: Payer: Self-pay | Admitting: *Deleted

## 2020-08-07 NOTE — Telephone Encounter (Signed)
Received phone call from [facility] requesting appointment for Debra Miranda with Dr. Denman George. She is scheduled for 9/6 at 2:45 pm. Enid Derry will notify patient of day and time.

## 2020-08-07 NOTE — Telephone Encounter (Signed)
CALLED PATIENT TO INFORM OF FU WITH DR. Denman George ON 10-22-20 - ARRIVAL TIME- 2:15 PM, SPOKE WITH PATIENT AND SHE IS AWARE OF THIS APPT.

## 2020-09-09 ENCOUNTER — Ambulatory Visit: Payer: Medicare PPO | Admitting: Family Medicine

## 2020-09-23 ENCOUNTER — Inpatient Hospital Stay: Admission: RE | Admit: 2020-09-23 | Payer: Medicare PPO | Source: Ambulatory Visit

## 2020-10-16 ENCOUNTER — Ambulatory Visit: Payer: Medicare PPO | Admitting: Family Medicine

## 2020-10-16 ENCOUNTER — Other Ambulatory Visit: Payer: Self-pay

## 2020-10-16 ENCOUNTER — Encounter: Payer: Self-pay | Admitting: Family Medicine

## 2020-10-16 VITALS — BP 120/74 | HR 75 | Temp 97.7°F | Resp 20 | Ht 68.0 in | Wt 172.0 lb

## 2020-10-16 DIAGNOSIS — E785 Hyperlipidemia, unspecified: Secondary | ICD-10-CM

## 2020-10-16 DIAGNOSIS — E1169 Type 2 diabetes mellitus with other specified complication: Secondary | ICD-10-CM | POA: Diagnosis not present

## 2020-10-16 DIAGNOSIS — C541 Malignant neoplasm of endometrium: Secondary | ICD-10-CM

## 2020-10-16 LAB — BAYER DCA HB A1C WAIVED: HB A1C (BAYER DCA - WAIVED): 6.3 % (ref <7.0)

## 2020-10-16 NOTE — Progress Notes (Signed)
Subjective: CC: DM PCP: [name], DO EVO:[id number] Debra Miranda is a 75 y.o. female presenting to clinic today for:  1. Type 2 Diabetes with hypertension, hyperlipidemia:  Patient reports compliance with Wilder Glade, Januvia and 16 units of Basaglar nightly.  She does not monitor her blood sugar because she typically " has to have her coffee and breakfast" before she does anything.  No vaginal symptoms but she self caths.  No reports of visual disturbance, chest pain or shortness of breath.  Last eye exam: UTD Last foot exam: 12/2019 Last A1c:  Lab Results  Component Value Date   HGBA1C 6.8 01/16/2020   Nephropathy screen indicated?: 01/2020 done Last flu, zoster and/or pneumovax:  Immunization History  Administered Date(s) Administered   Fluad Quad(high Dose 65+) 11/15/2018, 11/22/2019   Influenza, High Dose Seasonal PF 11/29/2015, 11/18/2016, 12/06/2017   Influenza,inj,Quad PF,6+ Mos 12/07/2013, 11/12/2014   PFIZER(Purple Top)SARS-COV-2 Vaccination 04/16/2019, 05/16/2019, 11/13/2019, 05/23/2020   Pneumococcal Conjugate-13 03/17/2013   Pneumococcal Polysaccharide-23 07/29/2017, 02/09/2018    2.  Endometrial cancer Continues to follow-up intermittently with oncology for adenocarcinoma.  Self caths as above.  ROS: Per HPI  Allergies  Allergen Reactions   Augmentin [Amoxicillin-Pot Clavulanate] Nausea And Vomiting   Latex Itching   Past Medical History:  Diagnosis Date   Arthritis    right hand   Cataract    RIGHT EYE; now removed   Depression    many years ago, no meds   Diabetes mellitus without complication ([place])    type 2   Endometrial adenocarcinoma ([place]) 01/05/2018   treated with HDR   Environmental allergies    Family history of breast cancer    Family history of kidney cancer    Family history of pancreatic cancer    Family history of stomach cancer    Family history of uterine cancer    Hemorrhoids    History of radiation therapy  04/05/18,02/25,3/3,3/10,3/18   Radiation to pelvis HDR. Dr. Gery Pray    Nodular basal cell carcinoma (BCC) 08/2017   LEFT LOWER LEG above foot   Orthostatic hypotension    Patient is unaware of this diagnosis    Current Outpatient Medications:    atorvastatin (LIPITOR) 20 MG tablet, TAKE 1 TABLET BY MOUTH EVERY DAY, Disp: 90 tablet, Rfl: 3   BD PEN NEEDLE NANO 2ND GEN 32G X 4 MM MISC, USE TO INJECT INSULIN DAILY AS DIRECTED. DX: E11.9, Disp: 100 each, Rfl: 5   Blood Glucose Monitoring Suppl (ACCU-CHEK GUIDE) w/Device KIT, Use to test blood sugar up to 4 times daily. DX: E11.9, Disp: 1 kit, Rfl: 0   Calcium Carbonate-Vit D-Min (CALCIUM 1200 PO), Take 1 tablet by mouth at bedtime. , Disp: , Rfl:    cetirizine (ZYRTEC) 10 MG tablet, Take 10 mg by mouth daily., Disp: , Rfl:    Cholecalciferol (VITAMIN D) 2000 UNITS CAPS, Take 2,000 Units by mouth at bedtime. , Disp: , Rfl:    dapagliflozin propanediol (FARXIGA) 10 MG TABS tablet, Take 10 mg by mouth daily., Disp: , Rfl:    diclofenac Sodium (VOLTAREN) 1 % GEL, Apply 2-4 g topically 4 (four) times daily. If not covered by ins, please help her find the OTC version, Disp: 200 g, Rfl: 3   doxycycline (VIBRA-TABS) 100 MG tablet, Take 1 tablet (100 mg total) by mouth 2 (two) times daily., Disp: 10 tablet, Rfl: 0   escitalopram (LEXAPRO) 10 MG tablet, TAKE 1 TABLET BY MOUTH EVERY DAY, Disp: 90 tablet, Rfl:  3   Ferrous Sulfate (IRON) 90 (18 Fe) MG TABS, Take by mouth., Disp: , Rfl:    glucose blood (ACCU-CHEK GUIDE) test strip, Use to test blood sugar up to 4 times daily. DX: E11.9, Disp: 100 each, Rfl: 12   Insulin Glargine (BASAGLAR KWIKPEN) 100 UNIT/ML, Inject 20 Units into the skin at bedtime. Sample #1pen LOT [id number], EXP 8/21, Disp: 15 mL, Rfl: 3   JANUVIA 100 MG tablet, TAKE 1 TABLET BY MOUTH EVERY EVENING, Disp: 90 tablet, Rfl: 2   Multiple Vitamins-Minerals (ONE-A-DAY VITACRAVES) CHEW, Chew by mouth., Disp: , Rfl:    naproxen (NAPROSYN)  500 MG tablet, Take 1 tablet (500 mg total) by mouth 2 (two) times daily as needed (joint pain)., Disp: 60 tablet, Rfl: 5   nystatin (MYCOSTATIN/NYSTOP) powder, Apply topically 2 (two) times daily., Disp: 60 g, Rfl: 2   OneTouch Delica Lancets 44W MISC, Use to test blood sugar daily as directed.  DX: E11.9 (Patient taking differently: Use to test blood sugar daily as directed.  DX: E11.9), Disp: 100 each, Rfl: 3   Pumpkin Seed-Soy Germ (AZO BLADDER CONTROL/GO-LESS PO), Take by mouth., Disp: , Rfl:    sodium chloride (OCEAN) 0.65 % SOLN nasal spray, Place 1 spray into both nostrils as needed for congestion., Disp: 30 mL, Rfl: 2 Social History   Socioeconomic History   Marital status: Soil scientist    Spouse name: [name]   Number of children: 1   Years of education: 14   Highest education level: Associate degree: occupational, Hotel manager, or vocational program  Occupational History   Occupation: Retired    Comment: [employer]  Tobacco Use   Smoking status: Every Day    Types: Cigars   Smokeless tobacco: Never   Tobacco comments:    8 small cigars a day  Vaping Use   Vaping Use: Former   Start date: 02/16/2010   Quit date: 02/17/2011   Devices: unsure  Substance and Sexual Activity   Alcohol use: Not Currently   Drug use: No   Sexual activity: Not Currently  Other Topics Concern   Not on file  Social History Narrative   Not on file   Social Determinants of Health   Financial Resource Strain: Low Risk    Difficulty of Paying Living Expenses: Not hard at all  Food Insecurity: No Food Insecurity   Worried About Charity fundraiser in the Last Year: Never true   [place] in the Last Year: Never true  Transportation Needs: No Transportation Needs   Lack of Transportation (Medical): No   Lack of Transportation (Non-Medical): No  Physical Activity: Insufficiently Active   Days of Exercise per Week: 7 days   Minutes of Exercise per Session: 20 min  Stress: No Stress  Concern Present   Feeling of Stress : Not at all  Social Connections: Socially Integrated   Frequency of Communication with Friends and Family: More than three times a week   Frequency of Social Gatherings with Friends and Family: Three times a week   Attends Religious Services: More than 4 times per year   Active Member of Clubs or Organizations: Yes   Attends Music therapist: More than 4 times per year   Marital Status: Living with partner  Intimate Partner Violence: Not At Risk   Fear of Current or Ex-Partner: No   Emotionally Abused: No   Physically Abused: No   Sexually Abused: No   Family History  Problem Relation  Age of Onset   Cervical cancer Mother 42       treated with radiation   Pancreatic cancer Mother 41   Stroke Father    Pulmonary fibrosis Brother    Alzheimer's disease Brother    Stomach cancer Brother        dx 29s   Diabetes Brother    Lung cancer Maternal Grandmother    Stroke Paternal Grandfather    Uterine cancer Maternal Aunt        dx 65s   Kidney cancer Maternal Uncle        dx 17s   Colon cancer Maternal Uncle        dx 58s   Breast cancer Cousin        maternal cousin dx 68    Objective: Office vital signs reviewed. BP 120/74   Pulse 75   Temp 97.7 F (36.5 Debra)   Resp 20   Ht 5' 8" (1.727 m)   Wt 172 lb (78 kg)   SpO2 95%   BMI 26.15 kg/m   Physical Examination:  General: Awake, alert, chronically ill-appearing female, No acute distress Cardio: regular rate and rhythm, S1S2 heard, no murmurs appreciated Pulm: clear to auscultation bilaterally, no wheezes, rhonchi or rales; normal work of breathing on room air.  Assessment/ Plan: 75 y.o. female   Controlled type 2 diabetes mellitus with other specified complication, without long-term current use of insulin ([place]) - Plan: Bayer DCA Hb A1c Waived  Hyperlipidemia associated with type 2 diabetes mellitus ([place]) - Plan: CMP14+EGFR, LDL Cholesterol, Direct  Endometrial  adenocarcinoma ([place]) - Plan: CBC  Sugar remains controlled at A1c of 6.3 today.  I think it is fine if she wants to discontinue Januvia and rely only on Farxiga and insulin.  No red flag signs or symptoms with use of Farxiga  Direct LDL collected today.  Continue statin  CBC ordered given ongoing surveillance for endometrial adenocarcinoma.  She has gained a little weight since her last visit.   No orders of the defined types were placed in this encounter.  No orders of the defined types were placed in this encounter.    [name], DO [place] [phone]

## 2020-10-17 LAB — CBC
Hematocrit: 47.9 % — ABNORMAL HIGH (ref 34.0–46.6)
Hemoglobin: 16.7 g/dL — ABNORMAL HIGH (ref 11.1–15.9)
MCH: 30.9 pg (ref 26.6–33.0)
MCHC: 34.9 g/dL (ref 31.5–35.7)
MCV: 89 fL (ref 79–97)
Platelets: 159 10*3/uL (ref 150–450)
RBC: 5.41 x10E6/uL — ABNORMAL HIGH (ref 3.77–5.28)
RDW: 13.4 % (ref 11.7–15.4)
WBC: 7.6 10*3/uL (ref 3.4–10.8)

## 2020-10-17 LAB — CMP14+EGFR
ALT: 21 IU/L (ref 0–32)
AST: 21 IU/L (ref 0–40)
Albumin/Globulin Ratio: 1.7 (ref 1.2–2.2)
Albumin: 4.3 g/dL (ref 3.7–4.7)
Alkaline Phosphatase: 132 IU/L — ABNORMAL HIGH (ref 44–121)
BUN/Creatinine Ratio: 19 (ref 12–28)
BUN: 13 mg/dL (ref 8–27)
Bilirubin Total: 0.4 mg/dL (ref 0.0–1.2)
CO2: 24 mmol/L (ref 20–29)
Calcium: 9.2 mg/dL (ref 8.7–10.3)
Chloride: 103 mmol/L (ref 96–106)
Creatinine, Ser: 0.7 mg/dL (ref 0.57–1.00)
Globulin, Total: 2.5 g/dL (ref 1.5–4.5)
Glucose: 121 mg/dL — ABNORMAL HIGH (ref 65–99)
Potassium: 4 mmol/L (ref 3.5–5.2)
Sodium: 142 mmol/L (ref 134–144)
Total Protein: 6.8 g/dL (ref 6.0–8.5)
eGFR: 91 mL/min/{1.73_m2} (ref >59)

## 2020-10-17 LAB — LDL CHOLESTEROL, DIRECT: LDL Direct: 74 mg/dL (ref 0–99)

## 2020-10-22 ENCOUNTER — Other Ambulatory Visit: Payer: Self-pay

## 2020-10-22 ENCOUNTER — Inpatient Hospital Stay: Payer: Medicare PPO | Attending: Gynecologic Oncology | Admitting: Gynecologic Oncology

## 2020-10-22 ENCOUNTER — Encounter: Payer: Self-pay | Admitting: Gynecologic Oncology

## 2020-10-22 ENCOUNTER — Other Ambulatory Visit: Payer: Self-pay | Admitting: Family

## 2020-10-22 VITALS — BP 127/69 | HR 79 | Temp 98.1°F | Resp 20 | Wt 175.0 lb

## 2020-10-22 DIAGNOSIS — Z8542 Personal history of malignant neoplasm of other parts of uterus: Secondary | ICD-10-CM

## 2020-10-22 DIAGNOSIS — Z90710 Acquired absence of both cervix and uterus: Secondary | ICD-10-CM | POA: Insufficient documentation

## 2020-10-22 DIAGNOSIS — Z923 Personal history of irradiation: Secondary | ICD-10-CM | POA: Diagnosis not present

## 2020-10-22 DIAGNOSIS — Z90722 Acquired absence of ovaries, bilateral: Secondary | ICD-10-CM | POA: Insufficient documentation

## 2020-10-22 DIAGNOSIS — Z1231 Encounter for screening mammogram for malignant neoplasm of breast: Secondary | ICD-10-CM

## 2020-10-22 DIAGNOSIS — C541 Malignant neoplasm of endometrium: Secondary | ICD-10-CM

## 2020-10-22 NOTE — Progress Notes (Signed)
Gynecologic Oncology Return Clinic Visit   Reason for Visit: surveillance visit in the setting of a history of stage IA gr2 EMCA  Treatment History: Oncology History  Endometrial adenocarcinoma ([place])  01/05/2018 Surgery   HSC D&C with findings showing a single polyp and grossly normal endometrium. The polyp returned with Grade 2 Endometrioid Adenocarcinoma "partially involving a polyp".   01/05/2018 Initial Diagnosis   Endometrial adenocarcinoma ([place])   02/03/2018 Genetic Testing   Patient has genetic testing with [facility]. Results revealed patient has the following mutation(s): none   02/08/2018 Surgery   Robotic assisted TLH/BSO with sentinel nodes and washings. Frozen section returned with no MI   02/08/2018 Pathologic Stage   IA grade 2, endometrioid, no MI, negative LNs, no LVSI   04/05/2018 - 05/04/2018 Radiation Therapy   Vaginal brachytherapy, Iridium HDR, 3 cm cylinder, tx length of 3 cm / 30 Gy in 5 fractions.     Interval History: Patient has started intermittently self catheterizing for urinary retention which developed in 2021 after a hospitalization and surgery for strangulated bowel. She sees Dr Claudia Desanctis for this. She has no bleeding symptoms.   Past Medical/Surgical History: Past Medical History:  Diagnosis Date   Arthritis    right hand   Cataract    RIGHT EYE; now removed   Depression    many years ago, no meds   Diabetes mellitus without complication ([employer])    type 2   Endometrial adenocarcinoma ([place]) 01/05/2018   treated with HDR   Environmental allergies    Family history of breast cancer    Family history of kidney cancer    Family history of pancreatic cancer    Family history of stomach cancer    Family history of uterine cancer    Hemorrhoids    History of radiation therapy 04/05/18,02/25,3/3,3/10,3/18   Radiation to pelvis HDR. Dr. Gery Pray    Nodular basal cell carcinoma ([place]) 08/2017   LEFT LOWER LEG above foot   Orthostatic  hypotension    Patient is unaware of this diagnosis    Past Surgical History:  Procedure Laterality Date   ABDOMINAL HYSTERECTOMY     Dr. Gerarda Fraction 02-08-18    BOWEL RESECTION N/A 06/01/2019   Procedure: SMALL BOWEL RESECTION;  Surgeon: Virl Cagey, MD;  Location: [facility];  Service: General;  Laterality: N/A;   CATARACT EXTRACTION     RIGHT EYE   HYSTEROSCOPY WITH D & C N/A 01/05/2018   Procedure: DILATATION AND CURETTAGE/HYSTEROSCOPY;  Surgeon: Florian Buff, MD;  Location: [facility];  Service: Gynecology;  Laterality: N/A;   LUMBAR DISC SURGERY  2006   OMENTECTOMY N/A 06/01/2019   Procedure: OMENTECTOMY;  Surgeon: Virl Cagey, MD;  Location: [facility];  Service: General;  Laterality: N/A;   POLYPECTOMY N/A 01/05/2018   Procedure: ENDOMETRIAL POLYPECTOMY;  Surgeon: Florian Buff, MD;  Location: [facility];  Service: Gynecology;  Laterality: N/A;   ROBOTIC ASSISTED TOTAL HYSTERECTOMY WITH BILATERAL SALPINGO OOPHERECTOMY Bilateral 02/08/2018   Procedure: XI ROBOTIC ASSISTED TOTAL HYSTERECTOMY WITH BILATERAL SALPINGO OOPHORECTOMY;  Surgeon: Isabel Caprice, MD;  Location: [facility];  Service: Gynecology;  Laterality: Bilateral;   SENTINEL NODE BIOPSY Bilateral 02/08/2018   Procedure: SENTINEL NODE BIOPSY AND PELVIC LYMPH NODE SAMPLING;  Surgeon: Isabel Caprice, MD;  Location: [facility];  Service: Gynecology;  Laterality: Bilateral;   TONSILLECTOMY     as a child   UMBILICAL HERNIA REPAIR N/A 06/01/2019   Procedure: HERNIA REPAIR UMBILICAL ADULT;  Surgeon: Virl Cagey, MD;  Location: [facility];  Service: General;  Laterality: N/A;    Family History  Problem Relation Age of Onset   Cervical cancer Mother 55       treated with radiation   Pancreatic cancer Mother 55   Stroke Father    Pulmonary fibrosis Brother    Alzheimer's disease Brother    Stomach cancer Brother        dx 84s   Diabetes Brother    Lung cancer Maternal Grandmother    Stroke Paternal Grandfather    Uterine  cancer Maternal Aunt        dx 54s   Kidney cancer Maternal Uncle        dx 31s   Colon cancer Maternal Uncle        dx 57s   Breast cancer Cousin        maternal cousin dx 88    Social History   Socioeconomic History   Marital status: Soil scientist    Spouse name: [name]   Number of children: 1   Years of education: 14   Highest education level: Associate degree: occupational, Hotel manager, or vocational program  Occupational History   Occupation: Retired    Comment: [employer]  Tobacco Use   Smoking status: Every Day    Types: Cigars   Smokeless tobacco: Never   Tobacco comments:    8 small cigars a day  Vaping Use   Vaping Use: Former   Start date: 02/16/2010   Quit date: 02/17/2011   Devices: unsure  Substance and Sexual Activity   Alcohol use: Not Currently   Drug use: No   Sexual activity: Not Currently  Other Topics Concern   Not on file  Social History Narrative   Not on file   Social Determinants of Health   Financial Resource Strain: Low Risk    Difficulty of Paying Living Expenses: Not hard at all  Food Insecurity: No Food Insecurity   Worried About Charity fundraiser in the Last Year: Never true   [place] in the Last Year: Never true  Transportation Needs: No Transportation Needs   Lack of Transportation (Medical): No   Lack of Transportation (Non-Medical): No  Physical Activity: Insufficiently Active   Days of Exercise per Week: 7 days   Minutes of Exercise per Session: 20 min  Stress: No Stress Concern Present   Feeling of Stress : Not at all  Social Connections: Socially Integrated   Frequency of Communication with Friends and Family: More than three times a week   Frequency of Social Gatherings with Friends and Family: Three times a week   Attends Religious Services: More than 4 times per year   Active Member of Clubs or Organizations: Yes   Attends Music therapist: More than 4 times per year   Marital Status:  Living with partner    Current Medications:  Current Outpatient Medications:    atorvastatin (LIPITOR) 20 MG tablet, TAKE 1 TABLET BY MOUTH EVERY DAY, Disp: 90 tablet, Rfl: 3   BD PEN NEEDLE NANO 2ND GEN 32G X 4 MM MISC, USE TO INJECT INSULIN DAILY AS DIRECTED. DX: E11.9, Disp: 100 each, Rfl: 5   Blood Glucose Monitoring Suppl (ACCU-CHEK GUIDE) w/Device KIT, Use to test blood sugar up to 4 times daily. DX: E11.9, Disp: 1 kit, Rfl: 0   Calcium Carbonate-Vit D-Min (CALCIUM 1200 PO), Take 1 tablet by mouth at bedtime. , Disp: ,  Rfl:    cetirizine (ZYRTEC) 10 MG tablet, Take 10 mg by mouth daily., Disp: , Rfl:    Cholecalciferol (VITAMIN D) 2000 UNITS CAPS, Take 2,000 Units by mouth at bedtime. , Disp: , Rfl:    dapagliflozin propanediol (FARXIGA) 10 MG TABS tablet, Take 10 mg by mouth daily., Disp: , Rfl:    escitalopram (LEXAPRO) 10 MG tablet, TAKE 1 TABLET BY MOUTH EVERY DAY, Disp: 90 tablet, Rfl: 3   Ferrous Sulfate (IRON) 90 (18 Fe) MG TABS, Take by mouth., Disp: , Rfl:    glucose blood (ACCU-CHEK GUIDE) test strip, Use to test blood sugar up to 4 times daily. DX: E11.9, Disp: 100 each, Rfl: 12   Insulin Glargine (BASAGLAR KWIKPEN) 100 UNIT/ML, Inject 20 Units into the skin at bedtime. Sample #1pen LOT [id number], EXP 8/21, Disp: 15 mL, Rfl: 3   Multiple Vitamins-Minerals (ONE-A-DAY VITACRAVES) CHEW, Chew by mouth. (Patient not taking: Reported on 10/16/2020), Disp: , Rfl:    nystatin (MYCOSTATIN/NYSTOP) powder, Apply topically 2 (two) times daily. (Patient not taking: Reported on 10/16/2020), Disp: 60 g, Rfl: 2   OneTouch Delica Lancets 09X MISC, Use to test blood sugar daily as directed.  DX: E11.9 (Patient taking differently: Use to test blood sugar daily as directed.  DX: E11.9), Disp: 100 each, Rfl: 3   Pumpkin Seed-Soy Germ (AZO BLADDER CONTROL/GO-LESS PO), Take by mouth. (Patient not taking: Reported on 10/16/2020), Disp: , Rfl:    sodium chloride (OCEAN) 0.65 % SOLN nasal spray, Place 1  spray into both nostrils as needed for congestion., Disp: 30 mL, Rfl: 2  Review of Symptoms: Pertinent positives as per HPI. Denies appetite changes, fevers, chills, fatigue, unexplained weight changes. Denies hearing loss, neck lumps or masses, mouth sores, ringing in ears or voice changes. Denies cough or wheezing.  Denies shortness of breath. Denies chest pain or palpitations. Denies leg swelling. Denies abdominal distention, pain, blood in stools, constipation, diarrhea, nausea, vomiting, or early satiety. Denies pain with intercourse, dysuria, frequency, hematuria or incontinence. Denies hot flashes, pelvic pain, vaginal bleeding or vaginal discharge.   Denies muscle pain/cramps. Denies itching, rash, or wounds. Denies dizziness, headaches, numbness or seizures. Denies swollen lymph nodes or glands, denies easy bruising or bleeding. Denies anxiety, depression, confusion, or decreased concentration.  Physical Exam: BP 127/69   Pulse 79   Temp 98.1 F (36.7 C)   Resp 20   Wt 175 lb (79.4 kg)   SpO2 98%   BMI 26.61 kg/m  General: Alert, oriented, no acute distress. HEENT: Atraumatic, sclera anicteric. Chest: Clear to auscultation bilaterally.  No wheezes, rhonchi, or rales. Cardiovascular: Regular rate and rhythm, no murmurs. Abdomen: soft, nontender.  Normoactive bowel sounds.  No masses or hepatosplenomegaly appreciated.  Well-healed robotic incisions.  Soft and reducible 3-4 umbilical hernia. Extremities: Grossly normal range of motion.  Warm, well perfused.  No edema bilaterally. Skin: No rashes or lesions noted. Lymphatics: No cervical, supraclavicular, or inguinal adenopathy. GU: Normal appearing external genitalia without erythema, excoriation, or lesions.  Speculum exam reveals moderately atrophic vaginal mucosa, no vaginal lesions, cuff intact.  Bimanual exam reveals cuff intact without nodularity or tenderness.  Rectovaginal exam confirms these findings.  Laboratory &  Radiologic Studies: None new  Assessment & Plan: Debra Miranda is a 75 y.o. woman with stage Ia grade 2 endometrial adenocarcinoma.  The patient is NED on exam today.  Per [facility] surveillance recommendations, we will continue surveillance visits every 6 months until 5 years out from completion of  treatment.  She will see Dr Sondra Come in March and Dr Berline Lopes in September, 2023.   Reviewed signs and symptoms of recurrence that should prompt a phone call sooner than her next appointment.  Thereasa Solo, MD

## 2020-10-22 NOTE — Patient Instructions (Signed)
Please notify Dr Denman George at phone number [phone] if you notice vaginal bleeding, new pelvic or abdominal pains, bloating, feeling full easy, or a change in bladder or bowel function.   Please have Dr Clabe Seal office contact Dr Charisse March office (at [phone]) in March after your appointment with him to request an appointment with Dr Berline Lopes for September, 2023.

## 2020-10-25 DIAGNOSIS — R339 Retention of urine, unspecified: Secondary | ICD-10-CM | POA: Diagnosis not present

## 2020-11-13 ENCOUNTER — Ambulatory Visit
Admission: RE | Admit: 2020-11-13 | Discharge: 2020-11-13 | Disposition: A | Discharge status: Home / Self Care | Payer: Medicare PPO | Source: Ambulatory Visit | Attending: Family Medicine | Admitting: Family Medicine

## 2020-11-13 ENCOUNTER — Other Ambulatory Visit: Payer: Self-pay

## 2020-11-13 DIAGNOSIS — Z1231 Encounter for screening mammogram for malignant neoplasm of breast: Secondary | ICD-10-CM

## 2021-01-14 ENCOUNTER — Telehealth: Payer: Self-pay | Admitting: Family Medicine

## 2021-01-14 NOTE — Telephone Encounter (Signed)
CAN YOU LET PATIENT KNOW: ONLY APPT I HAD AVAIL WAS 12/15 AT 1PM SHE NEEDS TO BRING MOST RECENT FINANCIALS, ETC I SCHEDULED IT UNDER PHARMACY CLINIC LET ME KNOW IF A PROBLEM!!  Thanks!  Almyra Free

## 2021-01-14 NOTE — Telephone Encounter (Signed)
Pt called requesting to speak with Almyra Free about if new forms needed to be filled out to renew herself for the program she is in to get her diabetic meds free.  Please advise and call patient.

## 2021-01-15 ENCOUNTER — Other Ambulatory Visit: Payer: Self-pay | Admitting: Family Medicine

## 2021-01-15 DIAGNOSIS — E1169 Type 2 diabetes mellitus with other specified complication: Secondary | ICD-10-CM

## 2021-01-15 NOTE — Telephone Encounter (Signed)
Pt confirmed that appt works   Noreene Larsson, [place], [facility]  [place], [place] [address] Direct Dial: [phone] [email or web address]: [email or web address]

## 2021-01-30 ENCOUNTER — Ambulatory Visit (INDEPENDENT_AMBULATORY_CARE_PROVIDER_SITE_OTHER): Payer: Medicare PPO | Admitting: Pharmacist

## 2021-01-30 DIAGNOSIS — E785 Hyperlipidemia, unspecified: Secondary | ICD-10-CM

## 2021-01-30 DIAGNOSIS — E119 Type 2 diabetes mellitus without complications: Secondary | ICD-10-CM

## 2021-01-30 MED ORDER — DAPAGLIFLOZIN PROPANEDIOL 10 MG PO TABS: 10.0000 mg | ORAL_TABLET | Freq: Every day | ORAL | 4 refills | Status: DC

## 2021-01-30 NOTE — Patient Instructions (Signed)
Visit Information  Thank you for taking time to visit with me today. Please don't hesitate to contact me if I can be of assistance to you before our next scheduled telephone appointment.  Following are the goals we discussed today:  Current Barriers:  Unable to independently afford treatment regimen Unable to maintain control of T2DM--NEED PRESCRIPTION ASSISTANCE Suboptimal therapeutic regimen for T2DM  Pharmacist Clinical Goal(s):  patient will verbalize ability to afford treatment regimen maintain control of T2DM, HLD as evidenced by GOAL A1C<7% AND GOAL LDL<70  adhere to plan to optimize therapeutic regimen for T2DM as evidenced by report of adherence to recommended medication management changes through collaboration with PharmD and provider.   Interventions: 1:1 collaboration with [name], DO regarding development and update of comprehensive plan of care as evidenced by provider attestation and co-signature Inter-disciplinary care team collaboration (see longitudinal plan of care) Comprehensive medication review performed; medication list updated in electronic medical record  Diabetes: Goal on Track (progressing): YES. Controlled; current treatment:BASAGLAR 16 UNITS DAILY, FARXIGA 10MG ;  JANUVIA WAS DISCONTINUED PATIENT ASSISTANCE COMPLETED FOR AZ&ME (FARXIGA) AND LILLY CARES (BASAGLAR) BOTH WILL SHIP TO PATIENT'S HOME Current glucose readings: fasting glucose: <130, post prandial glucose: <180 POST PRANDIAL TODAY IN OFFICE WAS 154 (2 HRS POST BAGEL/CREAM CHEESE) Denies hypoglycemic/hyperglycemic symptoms Discussed meal planning options and Plate method for healthy eating Avoid sugary drinks and desserts Incorporate balanced protein, non starchy veggies, 1 serving of carbohydrate with each meal Increase water intake Increase physical activity as able Current exercise: N/A--DIFFICULTY WALKING Educated on MEDICATIONS (PURPOSE AND SIDE EFFECTS) Recommended BASAGLAR AND  FARXIGA (AVOIDING GLP1 GIVEN GI HISTORY) Assessed patient finances. oPATIENT ASSISTANCE COMPLETED FOR AZ&ME (FARXIGA) AND LILLY CARES (BASAGLAR)  Hyperlipidemia:  New goal. Uncontrolled; current treatment:ATORVASTATIN; NEEDS UPDATED LABS IN January 2023 Medications previously tried: N/A  Current dietary patterns: RECOMMENDED HEART HEALTHY/HEALTHY PLATE METHOD Lipid Panel     Component Value Date/Time   CHOL 124 08/27/2017 1314   TRIG 126 08/27/2017 1314   TRIG 108 05/09/2014 1403   HDL 53 08/27/2017 1314   HDL 51 05/09/2014 1403   CHOLHDL 2.3 08/27/2017 1314   LDLCALC 46 08/27/2017 1314   LDLDIRECT 74 10/16/2020 1509   LABVLDL 25 08/27/2017 1314     Patient Goals/Self-Care Activities patient will:  - take medications as prescribed as evidenced by patient report and record review check glucose DAILY FASTING OR IF SYMPTOMATIC, document, and provide at future appointments collaborate with provider on medication access solutions target a minimum of 150 minutes of moderate intensity exercise weekly engage in dietary modifications by FOLLOWING HEALTHY PLATE METHOD HANDOUT   Please call the care guide team at [phone] if you need to cancel or reschedule your appointment.   The patient verbalized understanding of instructions, educational materials, and care plan provided today and declined offer to receive copy of patient instructions, educational materials, and care plan.    Regina Eck, PharmD, BCPS Clinical Pharmacist, [place]  II Phone [phone]

## 2021-01-30 NOTE — Progress Notes (Signed)
Chronic Care Management Pharmacy Note  01/30/2021 Name:  Debra Miranda MRN:  [id number] DOB:  04/07/1945  Summary: T2DM, HLD  Recommendations/Changes made from today's visit: Diabetes: Goal on Track (progressing): YES. Controlled; current treatment:BASAGLAR 16 UNITS DAILY, FARXIGA 10MG;  JANUVIA WAS DISCONTINUED PATIENT ASSISTANCE COMPLETED FOR AZ&ME (FARXIGA) AND LILLY CARES (BASAGLAR) BOTH WILL SHIP TO PATIENT'S HOME Current glucose readings: fasting glucose: <130, post prandial glucose: <180 POST PRANDIAL TODAY IN OFFICE WAS 154 (2 HRS POST BAGEL/CREAM CHEESE) Denies hypoglycemic/hyperglycemic symptoms Discussed meal planning options and Plate method for healthy eating Avoid sugary drinks and desserts Incorporate balanced protein, non starchy veggies, 1 serving of carbohydrate with each meal Increase water intake Increase physical activity as able Current exercise: N/A--DIFFICULTY WALKING Educated on MEDICATIONS (PURPOSE AND SIDE EFFECTS) Recommended BASAGLAR AND FARXIGA (AVOIDING GLP1 GIVEN GI HISTORY) Assessed patient finances. oPATIENT ASSISTANCE COMPLETED FOR AZ&ME (FARXIGA) AND LILLY CARES (BASAGLAR)  Hyperlipidemia:  New goal. Uncontrolled; current treatment:ATORVASTATIN; NEEDS UPDATED LABS IN January 2023 Medications previously tried: N/A  Current dietary patterns: RECOMMENDED HEART HEALTHY/HEALTHY PLATE METHOD Lipid Panel     Component Value Date/Time   CHOL 124 08/27/2017 1314   TRIG 126 08/27/2017 1314   TRIG 108 05/09/2014 1403   HDL 53 08/27/2017 1314   HDL 51 05/09/2014 1403   CHOLHDL 2.3 08/27/2017 1314   LDLCALC 46 08/27/2017 1314   LDLDIRECT 74 10/16/2020 1509   LABVLDL 25 08/27/2017 1314   Patient Goals/Self-Care Activities patient will:  - take medications as prescribed as evidenced by patient report and record review check glucose DAILY FASTING OR IF SYMPTOMATIC, document, and provide at future appointments collaborate with provider on  medication access solutions target a minimum of 150 minutes of moderate intensity exercise weekly engage in dietary modifications by FOLLOWING HEALTHY PLATE METHOD HANDOUT  Plan: F/U 02/2021  Subjective: Debra Miranda is an 75 y.o. year old female who is a primary patient of [name], DO.  The CCM team was consulted for assistance with disease management and care coordination needs.    Engaged with patient face to face for initial visit in response to provider referral for pharmacy case management and/or care coordination services.   Consent to Services:  The patient was given the following information about Chronic Care Management services today, agreed to services, and gave verbal consent: 1. CCM service includes personalized support from designated clinical staff supervised by the primary care provider, including individualized plan of care and coordination with other care providers 2. 24/7 contact phone numbers for assistance for urgent and routine care needs. 3. Service will only be billed when office clinical staff spend 20 minutes or more in a month to coordinate care. 4. Only one practitioner may furnish and bill the service in a calendar month. 5.The patient may stop CCM services at any time (effective at the end of the month) by phone call to the office staff. 6. The patient will be responsible for cost sharing (co-pay) of up to 20% of the service fee (after annual deductible is met). Patient agreed to services and consent obtained.  Patient Care Team: [name], DO as PCP - General (Family Medicine) Lavera Guise, [facility] (Pharmacist) Robley Fries, MD as Consulting Physician (Urology) Jola Schmidt, MD as Consulting Physician (Ophthalmology)   Objective:  Lab Results  Component Value Date   CREATININE 0.70 10/16/2020   CREATININE 0.61 01/16/2020   CREATININE 0.35 (L) 06/05/2019    Lab Results  Component Value Date  HGBA1C 6.3 10/16/2020   Last  diabetic Eye exam:  Lab Results  Component Value Date/Time   HMDIABEYEEXA No Retinopathy 04/16/2020 12:00 AM    Last diabetic Foot exam: No results found for: HMDIABFOOTEX      Component Value Date/Time   CHOL 124 08/27/2017 1314   TRIG 126 08/27/2017 1314   TRIG 108 05/09/2014 1403   HDL 53 08/27/2017 1314   HDL 51 05/09/2014 1403   CHOLHDL 2.3 08/27/2017 1314   LDLCALC 46 08/27/2017 1314   LDLDIRECT 74 10/16/2020 1509    Hepatic Function Latest Ref Rng & Units 10/16/2020 01/16/2020 06/01/2019  Total Protein 6.0 - 8.5 g/dL 6.8 7.0 7.2  Albumin 3.7 - 4.7 g/dL 4.3 4.4 4.1  AST 0 - 40 IU/L '21 15 15  ' ALT 0 - 32 IU/L '21 17 18  ' Alk Phosphatase 44 - 121 IU/L 132(H) 124(H) 108  Total Bilirubin 0.0 - 1.2 mg/dL 0.4 0.3 1.3(H)    Lab Results  Component Value Date/Time   TSH 1.650 05/09/2014 02:03 PM    CBC Latest Ref Rng & Units 10/16/2020 06/03/2019 06/02/2019  WBC 3.4 - 10.8 x10E3/uL 7.6 6.6 7.8  Hemoglobin 11.1 - 15.9 g/dL 16.7(H) 13.1 14.8  Hematocrit 34.0 - 46.6 % 47.9(H) 40.5 45.5  Platelets 150 - 450 x10E3/uL 159 95(L) 107(L)    Lab Results  Component Value Date/Time   VD25OH 31.2 08/27/2017 01:14 PM   VD25OH 35.8 11/16/2016 10:10 AM    Clinical ASCVD: No  The ASCVD Risk score (Arnett DK, et al., 2019) failed to calculate for the following reasons:   Cannot find a previous HDL lab   Cannot find a previous total cholesterol lab    Other: (CHADS2VASc if Afib, PHQ9 if depression, MMRC or CAT for COPD, ACT, DEXA)  Social History   Tobacco Use  Smoking Status Every Day   Types: Cigars  Smokeless Tobacco Never  Tobacco Comments   8 small cigars a day   BP Readings from Last 3 Encounters:  10/22/20 127/69  10/16/20 120/74  05/13/20 (!) 141/87   Pulse Readings from Last 3 Encounters:  10/22/20 79  10/16/20 75  05/13/20 81   Wt Readings from Last 3 Encounters:  10/22/20 175 lb (79.4 kg)  10/16/20 172 lb (78 kg)  07/22/20 173 lb (78.5 kg)    Assessment:  Review of patient past medical history, allergies, medications, health status, including review of consultants reports, laboratory and other test data, was performed as part of comprehensive evaluation and provision of chronic care management services.   SDOH:  (Social Determinants of Health) assessments and interventions performed:    CCM Care Plan  Allergies  Allergen Reactions   Augmentin [Amoxicillin-Pot Clavulanate] Nausea And Vomiting   Latex Itching    Medications Reviewed Today     Reviewed by Lavera Guise, [facility] (Pharmacist) on 01/30/21 at 1305  Med List Status: <None>   Medication Order Taking? Sig Documenting Provider Last Dose Status Informant  atorvastatin (LIPITOR) 20 MG tablet [id number]  TAKE 1 TABLET BY MOUTH EVERY DAY Gottschalk, Ashly M, DO  Active   BD PEN NEEDLE NANO 2ND GEN 32G X 4 MM MISC [id number] No USE TO INJECT INSULIN DAILY AS DIRECTED. DX: E11.9 [name], DO Taking Active   Blood Glucose Monitoring Suppl (ACCU-CHEK GUIDE) w/Device KIT [id number] No Use to test blood sugar up to 4 times daily. DX: E11.9 [name], DO Taking Active   Calcium Carbonate-Vit D-Min (CALCIUM 1200 PO) [id number]  No Take 1 tablet by mouth at bedtime.  [provider] Taking Active Self  cetirizine (ZYRTEC) 10 MG tablet [id number] No Take 10 mg by mouth daily. [provider] Taking Active Self  Cholecalciferol (VITAMIN D) 2000 UNITS CAPS [id number] No Take 2,000 Units by mouth at bedtime.  [provider] Taking Active Self  dapagliflozin propanediol (FARXIGA) 10 MG TABS tablet [id number] No Take 10 mg by mouth daily. [provider] Taking Active   escitalopram (LEXAPRO) 10 MG tablet [id number] No TAKE 1 TABLET BY MOUTH EVERY DAY Gottschalk, Ashly M, DO Taking Active   Ferrous Sulfate (IRON) 90 (18 Fe) MG TABS [id number] No Take by mouth. [provider] Taking Active Self  glucose blood (ACCU-CHEK GUIDE) test strip [id number] No  Use to test blood sugar up to 4 times daily. DX: E11.9 [name], DO Taking Active   Insulin Glargine (BASAGLAR KWIKPEN) 100 UNIT/ML [id number] No Inject 20 Units into the skin at bedtime. Sample #1pen LOT [id number], EXP 8/21 [name], DO Taking Active            Med Note Parthenia Ames Jan 30, 2021  1:05 PM) VIA LILLY CARES PATIENT ASSISTANCE PROGRAM  Multiple Vitamins-Minerals (ONE-A-DAY VITACRAVES) CHEW [id number] No Chew by mouth. [provider] Taking Active   nystatin (MYCOSTATIN/NYSTOP) powder [id number] No Apply topically 2 (two) times daily.  Patient not taking: No sig reported   Nancy Marus, MD Not Taking Active   OneTouch Delica Lancets 80D MISC [id number] No Use to test blood sugar daily as directed.  DX: E11.9  Patient taking differently: Use to test blood sugar daily as directed.  DX: E11.9   [name], DO Taking Active   Pumpkin Seed-Soy Germ (AZO BLADDER CONTROL/GO-LESS PO) [id number] No Take by mouth.  Patient not taking: No sig reported   [provider] Not Taking Active   sodium chloride (OCEAN) 0.65 % SOLN nasal spray [id number] No Place 1 spray into both nostrils as needed for congestion.  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[place])    Strangulated umbilical hernia    Genetic testing 04/21/2018   Family history of uterine cancer    Family history of pancreatic cancer    Family history of stomach cancer    Family history of kidney cancer    Family history of breast cancer    Uterine cancer ([place]) 02/08/2018   Endometrial adenocarcinoma ([place]) 01/21/2018   Smokes  cigars 12/06/2017   Nodular basal cell carcinoma (BCC) 09/15/2017   Foot callus 08/30/2017   Nonhealing skin ulcer, limited to breakdown of skin ([place]) 08/30/2017   Claw toe, acquired, right 08/30/2017   Osteopenia of neck of left femur 08/17/2016   Irritable bowel syndrome 12/03/2015   Incontinence of feces with fecal urgency 12/03/2015   Controlled type 2 diabetes mellitus without complication, without long-term current use of insulin ([place]) 09/09/2015   Hyperlipidemia associated with type 2 diabetes mellitus ([place]) 09/09/2015   Depression 09/09/2015    Immunization History  Administered Date(s) Administered   Fluad Quad(high Dose 65+) 11/15/2018,  11/22/2019   Influenza, High Dose Seasonal PF 11/29/2015, 11/18/2016, 12/06/2017   Influenza,inj,Quad PF,6+ Mos 12/07/2013, 11/12/2014   Influenza-Unspecified 11/20/2020   PFIZER(Purple Top)SARS-COV-2 Vaccination 04/16/2019, 05/16/2019, 11/13/2019, 05/23/2020, 11/20/2020   Pneumococcal Conjugate-13 03/17/2013   Pneumococcal Polysaccharide-23 07/29/2017, 02/09/2018    Conditions to be addressed/monitored: HLD and DMII  Care Plan : PHARMD MEDICATION MANAGEMENT  Updates made by Lavera Guise, [place] since 01/30/2021 12:00 AM     Problem: DISEASE PROGRESSION PREVENTION      Long-Range Goal: T2DM   This Visit's Progress: On track  Priority: High  Note:   Current Barriers:  Unable to independently afford treatment regimen Unable to maintain control of T2DM--NEED PRESCRIPTION ASSISTANCE Suboptimal therapeutic regimen for T2DM  Pharmacist Clinical Goal(s):  patient will verbalize ability to afford treatment regimen maintain control of T2DM, HLD as evidenced by GOAL A1C<7% AND GOAL LDL<70  adhere to plan to optimize therapeutic regimen for T2DM as evidenced by report of adherence to recommended medication management changes through collaboration with PharmD and provider.   Interventions: 1:1 collaboration with [name], DO  regarding development and update of comprehensive plan of care as evidenced by provider attestation and co-signature Inter-disciplinary care team collaboration (see longitudinal plan of care) Comprehensive medication review performed; medication list updated in electronic medical record  Diabetes: Goal on Track (progressing): YES. Controlled; current treatment:BASAGLAR 16 UNITS DAILY, FARXIGA 10MG;  JANUVIA WAS DISCONTINUED PATIENT ASSISTANCE COMPLETED FOR AZ&ME (FARXIGA) AND LILLY CARES (BASAGLAR) BOTH WILL SHIP TO PATIENT'S HOME Current glucose readings: fasting glucose: <130, post prandial glucose: <180 POST PRANDIAL TODAY IN OFFICE WAS 154 (2 HRS POST BAGEL/CREAM CHEESE) Denies hypoglycemic/hyperglycemic symptoms Discussed meal planning options and Plate method for healthy eating Avoid sugary drinks and desserts Incorporate balanced protein, non starchy veggies, 1 serving of carbohydrate with each meal Increase water intake Increase physical activity as able Current exercise: N/A--DIFFICULTY WALKING Educated on MEDICATIONS (PURPOSE AND SIDE EFFECTS) Recommended BASAGLAR AND FARXIGA (AVOIDING GLP1 GIVEN GI HISTORY) Assessed patient finances. oPATIENT ASSISTANCE COMPLETED FOR AZ&ME (FARXIGA) AND LILLY CARES (BASAGLAR)  Hyperlipidemia:  New goal. Uncontrolled; current treatment:ATORVASTATIN; NEEDS UPDATED LABS IN January 2023 Medications previously tried: N/A  Current dietary patterns: RECOMMENDED HEART HEALTHY/HEALTHY PLATE METHOD Lipid Panel     Component Value Date/Time   CHOL 124 08/27/2017 1314   TRIG 126 08/27/2017 1314   TRIG 108 05/09/2014 1403   HDL 53 08/27/2017 1314   HDL 51 05/09/2014 1403   CHOLHDL 2.3 08/27/2017 1314   LDLCALC 46 08/27/2017 1314   LDLDIRECT 74 10/16/2020 1509   LABVLDL 25 08/27/2017 1314    Patient Goals/Self-Care Activities patient will:  - take medications as prescribed as evidenced by patient report and record review check glucose DAILY  FASTING OR IF SYMPTOMATIC, document, and provide at future appointments collaborate with provider on medication access solutions target a minimum of 150 minutes of moderate intensity exercise weekly engage in dietary modifications by FOLLOWING HEALTHY PLATE METHOD HANDOUT      Medication Assistance: Application for LILLY CARES/BASAGLAR AND AZ&ME/FARXIGA  medication assistance program. in process.  Anticipated assistance start date TBD-2023.  See plan of care for additional detail.  Patient's preferred pharmacy is:   [id number] [id number]: [phone]: [phone] Follow Up:  Patient agrees to Care Plan and Follow-up.  Plan: Telephone follow up appointment with care management team member scheduled for:  02/2021  JRegina Eck PharmD, BCPS  Clinical Pharmacist, [place]  II Phone [phone]

## 2021-02-15 DIAGNOSIS — E119 Type 2 diabetes mellitus without complications: Secondary | ICD-10-CM

## 2021-02-15 DIAGNOSIS — E785 Hyperlipidemia, unspecified: Secondary | ICD-10-CM

## 2021-02-15 DIAGNOSIS — E1169 Type 2 diabetes mellitus with other specified complication: Secondary | ICD-10-CM | POA: Diagnosis not present

## 2021-03-05 NOTE — Progress Notes (Signed)
Received notification from [place] regarding approval for [facility]. Patient assistance approved from 03/01/21 to 02/15/22.  Phone: [phone]

## 2021-03-10 ENCOUNTER — Telehealth: Payer: Self-pay | Admitting: Family Medicine

## 2021-03-12 ENCOUNTER — Ambulatory Visit (INDEPENDENT_AMBULATORY_CARE_PROVIDER_SITE_OTHER): Payer: Medicare PPO | Admitting: Pharmacist

## 2021-03-12 DIAGNOSIS — E785 Hyperlipidemia, unspecified: Secondary | ICD-10-CM

## 2021-03-12 DIAGNOSIS — E119 Type 2 diabetes mellitus without complications: Secondary | ICD-10-CM

## 2021-03-12 DIAGNOSIS — E1169 Type 2 diabetes mellitus with other specified complication: Secondary | ICD-10-CM

## 2021-03-12 NOTE — Telephone Encounter (Signed)
Can you make sure she is re-enrolled?  Thank you!

## 2021-03-18 ENCOUNTER — Ambulatory Visit (INDEPENDENT_AMBULATORY_CARE_PROVIDER_SITE_OTHER): Payer: Medicare PPO | Admitting: Family Medicine

## 2021-03-18 ENCOUNTER — Encounter: Payer: Self-pay | Admitting: Family Medicine

## 2021-03-18 VITALS — BP 123/73 | HR 78 | Temp 98.3°F | Ht 68.0 in | Wt 172.4 lb

## 2021-03-18 DIAGNOSIS — L84 Corns and callosities: Secondary | ICD-10-CM | POA: Diagnosis not present

## 2021-03-18 DIAGNOSIS — Z23 Encounter for immunization: Secondary | ICD-10-CM

## 2021-03-18 DIAGNOSIS — Z872 Personal history of diseases of the skin and subcutaneous tissue: Secondary | ICD-10-CM | POA: Diagnosis not present

## 2021-03-18 DIAGNOSIS — E119 Type 2 diabetes mellitus without complications: Secondary | ICD-10-CM | POA: Diagnosis not present

## 2021-03-18 DIAGNOSIS — E785 Hyperlipidemia, unspecified: Secondary | ICD-10-CM

## 2021-03-18 DIAGNOSIS — E1169 Type 2 diabetes mellitus with other specified complication: Secondary | ICD-10-CM | POA: Diagnosis not present

## 2021-03-18 DIAGNOSIS — K529 Noninfective gastroenteritis and colitis, unspecified: Secondary | ICD-10-CM

## 2021-03-18 LAB — BAYER DCA HB A1C WAIVED: HB A1C (BAYER DCA - WAIVED): 7.1 % — ABNORMAL HIGH (ref 4.8–5.6)

## 2021-03-18 MED ORDER — CULTURELLE DIGESTIVE HEALTH PO CAPS: ORAL_CAPSULE | ORAL | 3 refills | Status: AC

## 2021-03-18 NOTE — Progress Notes (Signed)
° °Subjective: °CC:DM °PCP: Gottschalk, Ashly M, DO °HPI:Debra Miranda is a 75 y.o. female presenting to clinic today for: ° °1. Type 2 Diabetes with hyperlipidemia:  °Sugar was very tightly controlled at last visit so Januvia was discontinued.  She is continued on Farxiga and insulin.  She reports frequent formed bowel movements but really voices no other concerns.  No vaginal symptoms reported. ° °Last eye exam: UTD °Last foot exam: needs °Last A1c:  °Lab Results  °Component Value Date  ° HGBA1C 6.3 10/16/2020  ° °Nephropathy screen indicated?: needs °Last flu, zoster and/or pneumovax:  °Immunization History  °Administered Date(s) Administered  ° Fluad Quad(high Dose 65+) 11/15/2018, 11/22/2019  ° Influenza, High Dose Seasonal PF 11/29/2015, 11/18/2016, 12/06/2017  ° Influenza,inj,Quad PF,6+ Mos 12/07/2013, 11/12/2014  ° Influenza-Unspecified 11/20/2020  ° PFIZER(Purple Top)SARS-COV-2 Vaccination 04/16/2019, 05/16/2019, 11/13/2019, 05/23/2020, 11/20/2020  ° Pneumococcal Conjugate-13 03/17/2013  ° Pneumococcal Polysaccharide-23 07/29/2017, 02/09/2018  ° ° °ROS: No chest pain, edema, headache or visual disturbance reported. ° °ROS: Per HPI ° °Allergies  °Allergen Reactions  ° Augmentin [Amoxicillin-Pot Clavulanate] Nausea And Vomiting  ° Latex Itching  ° °Past Medical History:  °Diagnosis Date  ° Arthritis   ° right hand  ° Cataract   ° RIGHT EYE; now removed  ° Depression   ° many years ago, no meds  ° Diabetes mellitus without complication (HCC)   ° type 2  ° Endometrial adenocarcinoma (HCC) 01/05/2018  ° treated with HDR  ° Environmental allergies   ° Family history of breast cancer   ° Family history of kidney cancer   ° Family history of pancreatic cancer   ° Family history of stomach cancer   ° Family history of uterine cancer   ° Hemorrhoids   ° History of radiation therapy 04/05/18,02/25,3/3,3/10,3/18  ° Radiation to pelvis HDR. Dr. James Kinard   ° Nodular basal cell carcinoma (BCC) 08/2017  ° LEFT LOWER  LEG above foot  ° Orthostatic hypotension   ° Patient is unaware of this diagnosis  ° ° °Current Outpatient Medications:  °  atorvastatin (LIPITOR) 20 MG tablet, TAKE 1 TABLET BY MOUTH EVERY DAY, Disp: 90 tablet, Rfl: 0 °  BD PEN NEEDLE NANO 2ND GEN 32G X 4 MM MISC, USE TO INJECT INSULIN DAILY AS DIRECTED. DX: E11.9, Disp: 100 each, Rfl: 5 °  Blood Glucose Monitoring Suppl (ACCU-CHEK GUIDE) w/Device KIT, Use to test blood sugar up to 4 times daily. DX: E11.9, Disp: 1 kit, Rfl: 0 °  Calcium Carbonate-Vit D-Min (CALCIUM 1200 PO), Take 1 tablet by mouth at bedtime. , Disp: , Rfl:  °  cetirizine (ZYRTEC) 10 MG tablet, Take 10 mg by mouth daily., Disp: , Rfl:  °  Cholecalciferol (VITAMIN D) 2000 UNITS CAPS, Take 2,000 Units by mouth at bedtime. , Disp: , Rfl:  °  dapagliflozin propanediol (FARXIGA) 10 MG TABS tablet, Take 1 tablet (10 mg total) by mouth daily., Disp: 90 tablet, Rfl: 4 °  escitalopram (LEXAPRO) 10 MG tablet, TAKE 1 TABLET BY MOUTH EVERY DAY, Disp: 90 tablet, Rfl: 3 °  Ferrous Sulfate (IRON) 90 (18 Fe) MG TABS, Take by mouth., Disp: , Rfl:  °  glucose blood (ACCU-CHEK GUIDE) test strip, Use to test blood sugar up to 4 times daily. DX: E11.9, Disp: 100 each, Rfl: 12 °  Insulin Glargine (BASAGLAR KWIKPEN) 100 UNIT/ML, Inject 20 Units into the skin at bedtime. Sample #1pen LOT [id number], EXP 8/21, Disp: 15 mL, Rfl: 3 °  Multiple Vitamins-Minerals (ONE-A-DAY   ONE-A-DAY VITACRAVES) CHEW, Chew by mouth., Disp: , Rfl:    OneTouch Delica Lancets 47S MISC, Use to test blood sugar daily as directed.  DX: E11.9 (Patient taking differently: Use to test blood sugar daily as directed.  DX: E11.9), Disp: 100 each, Rfl: 3   Pumpkin Seed-Soy Germ (AZO BLADDER CONTROL/GO-LESS PO), Take by mouth. (Patient not taking: Reported on 10/16/2020), Disp: , Rfl:    sodium chloride (OCEAN) 0.65 % SOLN nasal spray, Place 1 spray into both nostrils as needed for congestion. (Patient not taking: Reported on 10/22/2020), Disp: 30 mL, Rfl:  2 Social History   Socioeconomic History   Marital status: Soil scientist    Spouse name: [name]   Number of children: 1   Years of education: 14   Highest education level: Associate degree: occupational, Hotel manager, or vocational program  Occupational History   Occupation: Retired    Comment: [employer]  Tobacco Use   Smoking status: Every Day    Types: Cigars   Smokeless tobacco: Never   Tobacco comments:    8 small cigars a day  Vaping Use   Vaping Use: Former   Start date: 02/16/2010   Quit date: 02/17/2011   Devices: unsure  Substance and Sexual Activity   Alcohol use: Not Currently   Drug use: No   Sexual activity: Not Currently  Other Topics Concern   Not on file  Social History Narrative   Not on file   Social Determinants of Health   Financial Resource Strain: Low Risk    Difficulty of Paying Living Expenses: Not hard at all  Food Insecurity: No Food Insecurity   Worried About Charity fundraiser in the Last Year: Never true   [place] in the Last Year: Never true  Transportation Needs: No Transportation Needs   Lack of Transportation (Medical): No   Lack of Transportation (Non-Medical): No  Physical Activity: Insufficiently Active   Days of Exercise per Week: 7 days   Minutes of Exercise per Session: 20 min  Stress: No Stress Concern Present   Feeling of Stress : Not at all  Social Connections: Socially Integrated   Frequency of Communication with Friends and Family: More than three times a week   Frequency of Social Gatherings with Friends and Family: Three times a week   Attends Religious Services: More than 4 times per year   Active Member of Clubs or Organizations: Yes   Attends Music therapist: More than 4 times per year   Marital Status: Living with partner  Intimate Partner Violence: Not At Risk   Fear of Current or Ex-Partner: No   Emotionally Abused: No   Physically Abused: No   Sexually Abused: No   Family History   Problem Relation Age of Onset   Cervical cancer Mother 44       treated with radiation   Pancreatic cancer Mother 57   Stroke Father    Pulmonary fibrosis Brother    Alzheimer's disease Brother    Stomach cancer Brother        dx 23s   Diabetes Brother    Lung cancer Maternal Grandmother    Stroke Paternal Grandfather    Uterine cancer Maternal Aunt        dx 12s   Kidney cancer Maternal Uncle        dx 78s   Colon cancer Maternal Uncle        dx 72s   Breast cancer Cousin  maternal cousin dx 76    Objective: Office vital signs reviewed. BP 123/73    Pulse 78    Temp 98.3 F (36.8 C)    Ht 5' 8" (1.727 m)    Wt 172 lb 6.4 oz (78.2 kg)    SpO2 95%    BMI 26.21 kg/m   Physical Examination:  General: Awake, alert, well nourished, chronically ill appearing.  No acute distress HEENT: Sclera white. Cardio: regular rate and rhythm, S1S2 heard, no murmurs appreciated Pulm: Coarse of breath sounds appreciated anteriorly.  Normal work of breathing on room air. Extremities: warm, well perfused, No edema, cyanosis or clubbing; +2 pulses bilaterally MSK: Ambulating independently  Diabetic Foot Exam - Simple   Simple Foot Form  03/18/2021  4:04 PM  Visual Inspection See comments: Yes Sensation Testing Intact to touch and monofilament testing bilaterally: Yes Pulse Check Posterior Tibialis and Dorsalis pulse intact bilaterally: Yes Comments Onychomycotic changes noted through the toenails bilaterally.  She has a very large foot callus along the left plantar surface of her fifth MTP.  No appreciable ulcer on exam today.    Assessment/ Plan: 76 y.o. female   Controlled type 2 diabetes mellitus without complication, without long-term current use of insulin ([place]) - Plan: Bayer DCA Hb A1c Waived, Microalbumin / creatinine urine ratio  Hyperlipidemia associated with type 2 diabetes mellitus (HCC)  Frequent stools - Plan: Lactobacillus-Inulin ([place])  CAPS  Foot callus - Plan: Ambulatory referral to Podiatry  History of foot ulcer - Plan: Ambulatory referral to Podiatry  She remains at goal for this elderly female at 7.1 today.  No need to resume Januvia.  May continue Farxiga and insulin as prescribed.  Check urine microalbumin  Not yet due for fasting lipid.  Continue statin  Recommend probiotic for frequent stools.  If ongoing would prefer to GI  Podiatry referral placed for this very large foot callus noted on the left foot.  Certainly will require debridement.  I offered diabetic shoes today but she declined  Orders Placed This Encounter  Procedures   Bayer DCA Hb A1c Waived   Microalbumin / creatinine urine ratio   Meds ordered this encounter  Medications   Lactobacillus-Inulin ([place]) CAPS    Sig: Take 1 capsule daily for digestive health    Dispense:  90 capsule    Refill:  [place], [place] [phone]

## 2021-03-24 NOTE — Progress Notes (Signed)
Chronic Care Management Pharmacy Note  03/12/2021 Name:  Debra Miranda MRN:  [id number] DOB:  1945/12/10  Summary: T2DM, HLD  Recommendations/Changes made from today's visit:  Diabetes: Goal on Track (progressing): YES. Controlled- slight increase in A1c 6.3 --> 7.1%; GFR 91 CURRENT treatment: BASAGLAR 20 UNITS DAILY, FARXIGA 10MG (continue current regimen for now)  Previous treatments: JANUVIA WAS DISCONTINUED at last PCP visit in 2022 (may have to add back; onglyza would be easy to obtain if we can't do GLP1-GI history approx 1 yr ago; will address) 2023 PATIENT ASSISTANCE COMPLETED FOR AZ&ME (FARXIGA) AND LILLY CARES (BASAGLAR) BOTH WILL SHIP TO PATIENT'S HOME Current glucose readings: fasting glucose: <130, post prandial glucose: <180 Hasn't been able to check; I requested patient bring her glucometer to me and I will assist with set up and education Denies hypoglycemic/hyperglycemic symptoms Discussed meal planning options and Plate method for healthy eating Avoid sugary drinks and desserts Incorporate balanced protein, non starchy veggies, 1 serving of carbohydrate with each meal Increase water intake Increase physical activity as able Current exercise: N/A--DIFFICULTY WALKING Educated on MEDICATIONS (PURPOSE AND SIDE EFFECTS); needs to check blood sugar Recommended BASAGLAR AND FARXIGA (AVOIDING GLP1 GIVEN GI HISTORY); may consider DPP4 if needed (would like to reduce insulin) Assessed patient finances. oPATIENT ASSISTANCE COMPLETED FOR AZ&ME (FARXIGA) AND LILLY CARES (BASAGLAR)  Hyperlipidemia:  Goal on Track (progressing): YES. Uncontrolled-LDL 74, almost at goal; current treatment:ATORVASTATIN; lipids due in august 2023 Medications previously tried: N/A  Current dietary patterns: RECOMMENDED HEART HEALTHY/HEALTHY PLATE METHOD Lipid Panel     Component Value Date/Time   CHOL 124 08/27/2017 1314   TRIG 126 08/27/2017 1314   TRIG 108 05/09/2014 1403   HDL 53  08/27/2017 1314   HDL 51 05/09/2014 1403   CHOLHDL 2.3 08/27/2017 1314   LDLCALC 46 08/27/2017 1314   LDLDIRECT 74 10/16/2020 1509   LABVLDL 25 08/27/2017 1314   Patient Goals/Self-Care Activities patient will:  - take medications as prescribed as evidenced by patient report and record review check glucose DAILY FASTING OR IF SYMPTOMATIC, document, and provide at future appointments collaborate with provider on medication access solutions target a minimum of 150 minutes of moderate intensity exercise weekly engage in dietary modifications by FOLLOWING HEALTHY PLATE METHOD HANDOUT/heart healthy diet  Plan: F/U IN 1 MONTH  Subjective: Debra Miranda is an 76 y.o. year old female who is a primary patient of [name], DO.  The CCM team was consulted for assistance with disease management and care coordination needs.    Engaged with patient face to face for follow up visit in response to provider referral for pharmacy case management and/or care coordination services.   Consent to Services:  The patient was given information about Chronic Care Management services, agreed to services, and gave verbal consent prior to initiation of services.  Please see initial visit note for detailed documentation.   Patient Care Team: [name], DO as PCP - General (Family Medicine) Lavera Guise, [facility] (Pharmacist) Robley Fries, MD as Consulting Physician (Urology) Jola Schmidt, MD as Consulting Physician (Ophthalmology)  Objective:  Lab Results  Component Value Date   CREATININE 0.70 10/16/2020   CREATININE 0.61 01/16/2020   CREATININE 0.35 (L) 06/05/2019    Lab Results  Component Value Date   HGBA1C 7.1 (H) 03/18/2021   Last diabetic Eye exam:  Lab Results  Component Value Date/Time   HMDIABEYEEXA No Retinopathy 04/16/2020 12:00 AM    Last diabetic Foot  exam: No results found for: HMDIABFOOTEX      Component Value Date/Time   CHOL 124 08/27/2017 1314    TRIG 126 08/27/2017 1314   TRIG 108 05/09/2014 1403   HDL 53 08/27/2017 1314   HDL 51 05/09/2014 1403   CHOLHDL 2.3 08/27/2017 1314   LDLCALC 46 08/27/2017 1314   LDLDIRECT 74 10/16/2020 1509    Hepatic Function Latest Ref Rng & Units 10/16/2020 01/16/2020 06/01/2019  Total Protein 6.0 - 8.5 g/dL 6.8 7.0 7.2  Albumin 3.7 - 4.7 g/dL 4.3 4.4 4.1  AST 0 - 40 IU/L _0 ALT 0 - 32 IU/L _1 Alk Phosphatase 44 - 121 IU/L 132(H) 124(H) 108  Total Bilirubin 0.0 - 1.2 mg/dL 0.4 0.3 1.3(H)    Lab Results  Component Value Date/Time   TSH 1.650 05/09/2014 02:03 PM    CBC Latest Ref Rng & Units 10/16/2020 06/03/2019 06/02/2019  WBC 3.4 - 10.8 x10E3/uL 7.6 6.6 7.8  Hemoglobin 11.1 - 15.9 g/dL 16.7(H) 13.1 14.8  Hematocrit 34.0 - 46.6 % 47.9(H) 40.5 45.5  Platelets 150 - 450 x10E3/uL 159 95(L) 107(L)    Lab Results  Component Value Date/Time   VD25OH 31.2 08/27/2017 01:14 PM   VD25OH 35.8 11/16/2016 10:10 AM    Clinical ASCVD: No  The ASCVD Risk score (Arnett DK, et al., 2019) failed to calculate for the following reasons:   Cannot find a previous HDL lab   Cannot find a previous total cholesterol lab    Other: (CHADS2VASc if Afib, PHQ9 if depression, MMRC or CAT for COPD, ACT, DEXA)  Social History   Tobacco Use  Smoking Status Every Day   Types: Cigars  Smokeless Tobacco Never  Tobacco Comments   8 small cigars a day   BP Readings from Last 3 Encounters:  03/18/21 123/73  10/22/20 127/69  10/16/20 120/74   Pulse Readings from Last 3 Encounters:  03/18/21 78  10/22/20 79  10/16/20 75   Wt Readings from Last 3 Encounters:  03/18/21 172 lb 6.4 oz (78.2 kg)  10/22/20 175 lb (79.4 kg)  10/16/20 172 lb (78 kg)    Assessment: Review of patient past medical history, allergies, medications, health status, including review of consultants reports, laboratory and other test data, was performed as part of comprehensive evaluation and provision of chronic care management  services.   SDOH:  (Social Determinants of Health) assessments and interventions performed:    CCM Care Plan  Allergies  Allergen Reactions   Augmentin [Amoxicillin-Pot Clavulanate] Nausea And Vomiting   Latex Itching    Medications Reviewed Today     Reviewed by Lavera Guise, [facility] (Pharmacist) on 03/24/21 at 1009  Med List Status: <None>   Medication Order Taking? Sig Documenting Provider Last Dose Status Informant  atorvastatin (LIPITOR) 20 MG tablet [id number] No TAKE 1 TABLET BY MOUTH EVERY DAY Gottschalk, Ashly M, DO Taking Active   BD PEN NEEDLE NANO 2ND GEN 32G X 4 MM MISC [id number] No USE TO INJECT INSULIN DAILY AS DIRECTED. DX: E11.9 [name], DO Taking Active   Blood Glucose Monitoring Suppl (ACCU-CHEK GUIDE) w/Device KIT [id number] No Use to test blood sugar up to 4 times daily. DX: E11.9 [name], DO Taking Active   Calcium Carbonate-Vit D-Min (CALCIUM 1200 PO) [id number] No Take 1 tablet by mouth at bedtime.  [provider] Taking Active Self  cetirizine (ZYRTEC) 10 MG tablet [id number] No Take 10 mg by mouth daily.  [provider] Taking Active Self  Cholecalciferol (VITAMIN D) 2000 UNITS CAPS [id number] No Take 2,000 Units by mouth at bedtime.  [provider] Taking Active Self  dapagliflozin propanediol (FARXIGA) 10 MG TABS tablet [id number] No Take 1 tablet (10 mg total) by mouth daily. [name], DO Taking Active            Med Note Shara Blazing Mar 24, 2021 10:09 AM) Via AZ&me patient assistance program; escribe to [facility] for refills   escitalopram (LEXAPRO) 10 MG tablet [id number] No TAKE 1 TABLET BY MOUTH EVERY DAY Ronnie Doss M, DO Taking Active   Ferrous Sulfate (IRON) 90 (18 Fe) MG TABS [id number] No Take by mouth. [provider] Taking Active Self  glucose blood (ACCU-CHEK GUIDE) test strip [id number] No Use to test blood sugar up to 4 times daily. DX: E11.9 [name], DO  Taking Active   Insulin Glargine (BASAGLAR KWIKPEN) 100 UNIT/ML [id number] No Inject 20 Units into the skin at bedtime. Sample #1pen LOT [id number], EXP 8/21 [name], DO Taking Active            Med Note Parthenia Ames Jan 30, 2021  1:05 PM) VIA LILLY CARES PATIENT ASSISTANCE PROGRAM  Lactobacillus-Inulin ([place]) CAPS [id number]  Take 1 capsule daily for digestive health [name], DO  Active   Multiple Vitamins-Minerals (ONE-A-DAY VITACRAVES) CHEW [id number] No Chew by mouth. [provider] Taking Active   OneTouch Delica Lancets 31D MISC [id number] No Use to test blood sugar daily as directed.  DX: E11.9  Patient taking differently: Use to test blood sugar daily as directed.  DX: E11.9   [name], DO Taking Active   Pumpkin Seed-Soy Germ (AZO BLADDER CONTROL/GO-LESS PO) [id number] No Take by mouth.  Patient not taking: Reported on 10/16/2020   [provider] Not Taking Active   sodium chloride (OCEAN) 0.65 % SOLN nasal spray [id number] No Place 1 spray into both nostrils as needed for congestion.  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[place])    Strangulated umbilical hernia    Genetic testing 04/21/2018   Family history of uterine cancer    Family history of pancreatic cancer    Family history of stomach cancer    Family history of kidney cancer    Family history of breast cancer    Uterine cancer ([place]) 02/08/2018   Endometrial adenocarcinoma ([place]) 01/21/2018   Smokes cigars 12/06/2017   Nodular basal cell carcinoma (BCC) 09/15/2017   Foot callus 08/30/2017    Nonhealing skin ulcer, limited to breakdown of skin ([place]) 08/30/2017   Claw toe, acquired, right 08/30/2017   Osteopenia of neck of left femur 08/17/2016   Irritable bowel syndrome 12/03/2015   Incontinence of feces with fecal urgency 12/03/2015   Controlled type 2 diabetes mellitus without complication, without long-term current use of insulin ([place]) 09/09/2015   Hyperlipidemia associated with type 2 diabetes mellitus ([place]) 09/09/2015   Depression 09/09/2015    Immunization History  Administered Date(s) Administered   Fluad Quad(high  Dose 65+) 11/15/2018, 11/22/2019   Influenza, High Dose Seasonal PF 11/29/2015, 11/18/2016, 12/06/2017   Influenza,inj,Quad PF,6+ Mos 12/07/2013, 11/12/2014   Influenza-Unspecified 11/20/2020   PFIZER(Purple Top)SARS-COV-2 Vaccination 04/16/2019, 05/16/2019, 11/13/2019, 05/23/2020, 11/20/2020   Pneumococcal Conjugate-13 03/17/2013   Pneumococcal Polysaccharide-23 07/29/2017, 02/09/2018   Zoster Recombinat (Shingrix) 03/18/2021    Conditions to be addressed/monitored: HLD and DMII  Care Plan : PHARMD MEDICATION MANAGEMENT  Updates made by Lavera Guise, RPH since 03/24/2021 12:00 AM     Problem: DISEASE PROGRESSION PREVENTION      Long-Range Goal: T2DM   Recent Progress: On track  Priority: High  Note:   Current Barriers:  Unable to independently afford treatment regimen Unable to maintain control of T2DM--NEED PRESCRIPTION ASSISTANCE Suboptimal therapeutic regimen for T2DM  Pharmacist Clinical Goal(s):  patient will verbalize ability to afford treatment regimen maintain control of T2DM, HLD as evidenced by GOAL A1C<7% AND GOAL LDL<70  adhere to plan to optimize therapeutic regimen for T2DM as evidenced by report of adherence to recommended medication management changes through collaboration with PharmD and provider.   Interventions: 1:1 collaboration with [name], DO regarding development and update of comprehensive plan of care  as evidenced by provider attestation and co-signature Inter-disciplinary care team collaboration (see longitudinal plan of care) Comprehensive medication review performed; medication list updated in electronic medical record  Diabetes: Goal on Track (progressing): YES. Controlled- slight increase in A1c 6.3 --> 7.1%; GFR 91 CURRENT treatment: BASAGLAR 20 UNITS DAILY, FARXIGA 10MG (continue current regimen for now)  Previous treatments: JANUVIA WAS DISCONTINUED at last PCP visit in 2022 (may have to add back; onglyza would be easy to obtain if we can't do GLP1-GI history approx 1 yr ago; will address) 2023 PATIENT ASSISTANCE COMPLETED FOR AZ&ME (FARXIGA) AND LILLY CARES (BASAGLAR) BOTH WILL SHIP TO PATIENT'S HOME Current glucose readings: fasting glucose: <130, post prandial glucose: <180 Hasn't been able to check; I requested patient bring her glucometer to me and I will assist with set up and education Denies hypoglycemic/hyperglycemic symptoms Discussed meal planning options and Plate method for healthy eating Avoid sugary drinks and desserts Incorporate balanced protein, non starchy veggies, 1 serving of carbohydrate with each meal Increase water intake Increase physical activity as able Current exercise: N/A--DIFFICULTY WALKING Educated on MEDICATIONS (PURPOSE AND SIDE EFFECTS); needs to check blood sugar Recommended BASAGLAR AND FARXIGA (AVOIDING GLP1 GIVEN GI HISTORY); may consider DPP4 if needed (would like to reduce insulin) Assessed patient finances. oPATIENT ASSISTANCE COMPLETED FOR AZ&ME (FARXIGA) AND LILLY CARES (BASAGLAR)  Hyperlipidemia:  Goal on Track (progressing): YES. Uncontrolled-LDL 74, almost at goal; current treatment:ATORVASTATIN; lipids due in august 2023 Medications previously tried: N/A  Current dietary patterns: RECOMMENDED HEART HEALTHY/HEALTHY PLATE METHOD Lipid Panel     Component Value Date/Time   CHOL 124 08/27/2017 1314   TRIG 126 08/27/2017 1314    TRIG 108 05/09/2014 1403   HDL 53 08/27/2017 1314   HDL 51 05/09/2014 1403   CHOLHDL 2.3 08/27/2017 1314   LDLCALC 46 08/27/2017 1314   LDLDIRECT 74 10/16/2020 1509   LABVLDL 25 08/27/2017 1314  Patient Goals/Self-Care Activities patient will:  - take medications as prescribed as evidenced by patient report and record review check glucose DAILY FASTING OR IF SYMPTOMATIC, document, and provide at future appointments collaborate with provider on medication access solutions target a minimum of 150 minutes of moderate intensity exercise weekly engage in dietary modifications by FOLLOWING HEALTHY PLATE METHOD HANDOUT /heart healthy diet      Medication  Assistance:  [place]; AZ&ME FOR FARXIGA--BOTH APPROVED FOR 2023; BOTH WILL SHIP TO PATIENT'S HOME  Patient's preferred pharmacy is:  [facility], [place] [address] Phone: [phone] Fax: [phone]  [id number] [place]Phone: [phone]: [phone] [place]Phone: [phone]: [address]Phone: [phone]: [phone] Uses pill box? No - N/A Pt endorses 100% compliance  Follow Up:  Patient agrees to Care Plan and Follow-up.  Plan: Telephone follow up appointment with care management team member scheduled for:  1 MONTH   JRegina Eck PharmD, BCPS Clinical Pharmacist, [place] Phone [phone]

## 2021-03-24 NOTE — Patient Instructions (Signed)
Visit Information  Following are the goals we discussed today:  Current Barriers:  Unable to independently afford treatment regimen Unable to maintain control of T2DM--NEED PRESCRIPTION ASSISTANCE Suboptimal therapeutic regimen for T2DM  Pharmacist Clinical Goal(s):  patient will verbalize ability to afford treatment regimen maintain control of T2DM, HLD as evidenced by GOAL A1C<7% AND GOAL LDL<70  adhere to plan to optimize therapeutic regimen for T2DM as evidenced by report of adherence to recommended medication management changes through collaboration with PharmD and provider.   Interventions: 1:1 collaboration with [name], DO regarding development and update of comprehensive plan of care as evidenced by provider attestation and co-signature Inter-disciplinary care team collaboration (see longitudinal plan of care) Comprehensive medication review performed; medication list updated in electronic medical record  Diabetes: Goal on Track (progressing): YES. Controlled- slight increase in A1c 6.3 --> 7.1%; GFR 91 CURRENT treatment: BASAGLAR 20 UNITS DAILY, FARXIGA 10MG  (continue current regimen for now)  Previous treatments: JANUVIA WAS DISCONTINUED at last PCP visit in 2022 (may have to add back; onglyza would be easy to obtain if we can't do GLP1-GI history approx 1 yr ago; will address) 2023 PATIENT ASSISTANCE COMPLETED FOR AZ&ME (FARXIGA) AND LILLY CARES (BASAGLAR) [place] TO PATIENT'S HOME Current glucose readings: fasting glucose: <130, post prandial glucose: <180 Hasn't been able to check; I requested patient bring her glucometer to me and I will assist with set up and education Denies hypoglycemic/hyperglycemic symptoms Discussed meal planning options and Plate method for healthy eating Avoid sugary drinks and desserts Incorporate balanced protein, non starchy veggies, 1 serving of carbohydrate with each meal Increase water intake Increase physical activity as  able Current exercise: N/A--DIFFICULTY WALKING Educated on MEDICATIONS (PURPOSE AND SIDE EFFECTS); needs to check blood sugar Recommended BASAGLAR AND FARXIGA (AVOIDING GLP1 GIVEN GI HISTORY); may consider DPP4 if needed (would like to reduce insulin) Assessed patient finances. oPATIENT ASSISTANCE COMPLETED FOR AZ&ME (FARXIGA) AND LILLY CARES (BASAGLAR)  Hyperlipidemia:  Goal on Track (progressing): YES. Uncontrolled-LDL 74, almost at goal; current treatment:ATORVASTATIN; lipids due in august 2023 Medications previously tried: N/A  Current dietary patterns: RECOMMENDED HEART HEALTHY/HEALTHY PLATE METHOD Lipid Panel     Component Value Date/Time   CHOL 124 08/27/2017 1314   TRIG 126 08/27/2017 1314   TRIG 108 05/09/2014 1403   HDL 53 08/27/2017 1314   HDL 51 05/09/2014 1403   CHOLHDL 2.3 08/27/2017 1314   LDLCALC 46 08/27/2017 1314   LDLDIRECT 74 10/16/2020 1509   LABVLDL 25 08/27/2017 1314   Patient Goals/Self-Care Activities patient will:  - take medications as prescribed as evidenced by patient report and record review check glucose DAILY FASTING OR IF SYMPTOMATIC, document, and provide at future appointments collaborate with provider on medication access solutions target a minimum of 150 minutes of moderate intensity exercise weekly engage in dietary modifications by [place] HANDOUT/heart healthy diet   Plan: Telephone follow up appointment with care management team member scheduled for:  3 months  Signature Regina Eck, PharmD, BCPS Clinical Pharmacist, [place] Phone [phone]  Please call the care guide team at [phone] if you need to cancel or reschedule your appointment.   Patient verbalizes understanding of instructions and care plan provided today and agrees to view in [place]. Active MyChart status confirmed with patient.

## 2021-04-13 ENCOUNTER — Other Ambulatory Visit: Payer: Self-pay | Admitting: Family Medicine

## 2021-04-14 ENCOUNTER — Telehealth: Payer: Self-pay | Admitting: Family Medicine

## 2021-04-15 ENCOUNTER — Other Ambulatory Visit: Payer: Self-pay | Admitting: Family Medicine

## 2021-04-15 DIAGNOSIS — Z7409 Other reduced mobility: Secondary | ICD-10-CM

## 2021-04-15 DIAGNOSIS — G8929 Other chronic pain: Secondary | ICD-10-CM

## 2021-04-15 NOTE — Telephone Encounter (Signed)
NA

## 2021-04-15 NOTE — Telephone Encounter (Signed)
I am trying to reorder this but am not sure her insurance will approve.  Can she remind me which facility she wants me to send this to?

## 2021-04-16 NOTE — Telephone Encounter (Signed)
Pt said said she wants rx sent to [facility] in [place] or [place] ?

## 2021-04-17 DIAGNOSIS — E119 Type 2 diabetes mellitus without complications: Secondary | ICD-10-CM | POA: Diagnosis not present

## 2021-04-17 DIAGNOSIS — H524 Presbyopia: Secondary | ICD-10-CM | POA: Diagnosis not present

## 2021-04-17 DIAGNOSIS — H2512 Age-related nuclear cataract, left eye: Secondary | ICD-10-CM | POA: Diagnosis not present

## 2021-04-17 LAB — HM DIABETES EYE EXAM

## 2021-04-17 NOTE — Telephone Encounter (Signed)
Completed another rx, handwritten to fax ?

## 2021-04-17 NOTE — Telephone Encounter (Signed)
[facility] ?

## 2021-04-21 ENCOUNTER — Telehealth: Payer: Self-pay | Admitting: Family Medicine

## 2021-04-21 NOTE — Telephone Encounter (Signed)
This has been faxed already. I will cc to Alyssa to check and make sure she got a confirmation. ?

## 2021-04-21 NOTE — Telephone Encounter (Signed)
Faxed 3/2 to [facility] and [facility] ?

## 2021-04-25 ENCOUNTER — Ambulatory Visit: Payer: Medicare PPO | Admitting: Pharmacist

## 2021-04-25 ENCOUNTER — Telehealth: Payer: Self-pay | Admitting: Family Medicine

## 2021-04-25 DIAGNOSIS — E119 Type 2 diabetes mellitus without complications: Secondary | ICD-10-CM

## 2021-04-30 ENCOUNTER — Ambulatory Visit (INDEPENDENT_AMBULATORY_CARE_PROVIDER_SITE_OTHER): Payer: Medicare PPO | Admitting: Pharmacist

## 2021-04-30 DIAGNOSIS — E119 Type 2 diabetes mellitus without complications: Secondary | ICD-10-CM

## 2021-04-30 DIAGNOSIS — E1169 Type 2 diabetes mellitus with other specified complication: Secondary | ICD-10-CM

## 2021-04-30 NOTE — Progress Notes (Signed)
?  Care Management  ? ?Follow Up Note ? ? ?04/25/2021 ?Name: Debra Miranda MRN: [id number] DOB: [birth year] ? ? ?Referred by: [name], DO ?Reason for referral : Chronic Care Management and Diabetes ? ? ?An unsuccessful telephone outreach was attempted today. The patient was referred to the case management team for assistance with care management and care coordination.  ? ?Follow Up Plan: Telephone follow up appointment with care management team member scheduled for: NEXT WEEK ? ? ? ?Regina Eck, PharmD, BCPS ?Clinical Pharmacist, [facility] ?[place] Phone [phone] ? ? ? ?

## 2021-04-30 NOTE — Progress Notes (Signed)
? ? ?Chronic Care Management ?Pharmacy Note ? ?04/30/2021 ?Name:  Debra Miranda MRN:  [id number] DOB:  05/15/1945 ? ?Summary: T2DM, HLD ? ?Recommendations/Changes made from today's visit: ?Diabetes: Goal on Track (progressing): YES. ?Controlled- slight increase in A1c 6.3 --> 7.1%; GFR 91 ?CURRENT treatment:  BASAGLAR 16 UNITS DAILY, FARXIGA 10MG (continue current regimen for now)  ?Previous treatments: JANUVIA WAS DISCONTINUED at last PCP visit in 2022 (may have to add back; onglyza would be easy to obtain if we can't do GLP1-GI history approx 1 yr ago; will address) ?2023 PATIENT ASSISTANCE COMPLETED FOR [place] (FARXIGA) AND [facility] (BASAGLAR) ?BOTH WILL SHIP TO PATIENT'S HOME ?[facility] APPLICATION RE-FAXED ON 04/30/21--THEY DID NOT RECEIVE ?Current glucose readings: fasting glucose: <130, post prandial glucose: <180 ? ? ?Subjective: ?Debra Miranda is an 76 y.o. year old female who is a primary patient of Gottschalk, Ashly M, DO.  The CCM team was consulted for assistance with disease management and care coordination needs.   ? ?Engaged with patient by telephone for follow up visit in response to provider referral for pharmacy case management and/or care coordination services.  ? ?Consent to Services:  ?The patient was given information about Chronic Care Management services, agreed to services, and gave verbal consent prior to initiation of services.  Please see initial visit note for detailed documentation.  ? ?Patient Care Team: ?Gottschalk, Ashly M, DO as PCP - General (Family Medicine) ?Pruitt, Julie D, RPH (Pharmacist) ?Pace, Maryellen D, MD as Consulting Physician (Urology) ?Bowen, Bradley, MD as Consulting Physician (Ophthalmology) ? ? ?Objective: ? ?Lab Results  ?Component Value Date  ? CREATININE 0.70 10/16/2020  ? CREATININE 0.61 01/16/2020  ? CREATININE 0.35 (L) 06/05/2019  ? ? ?Lab Results  ?Component Value Date  ? HGBA1C 7.1 (H) 03/18/2021  ? ?Last diabetic Eye exam:  ?Lab Results  ?Component  Value Date/Time  ? HMDIABEYEEXA No Retinopathy 04/16/2020 12:00 AM  ?  ?Last diabetic Foot exam: No results found for: HMDIABFOOTEX  ? ?   ?Component Value Date/Time  ? CHOL 124 08/27/2017 1314  ? TRIG 126 08/27/2017 1314  ? TRIG 108 05/09/2014 1403  ? HDL 53 08/27/2017 1314  ? HDL 51 05/09/2014 1403  ? CHOLHDL 2.3 08/27/2017 1314  ? LDLCALC 46 08/27/2017 1314  ? LDLDIRECT 74 10/16/2020 1509  ? ? ?Hepatic Function Latest Ref Rng & Units 10/16/2020 01/16/2020 06/01/2019  ?Total Protein 6.0 - 8.5 g/dL 6.8 7.0 7.2  ?Albumin 3.7 - 4.7 g/dL 4.3 4.4 4.1  ?AST 0 - 40 IU/L 21 15 15  ?ALT 0 - 32 IU/L 21 17 18  ?Alk Phosphatase 44 - 121 IU/L 132(H) 124(H) 108  ?Total Bilirubin 0.0 - 1.2 mg/dL 0.4 0.3 1.3(H)  ? ? ?Lab Results  ?Component Value Date/Time  ? TSH 1.650 05/09/2014 02:03 PM  ? ? ?CBC Latest Ref Rng & Units 10/16/2020 06/03/2019 06/02/2019  ?WBC 3.4 - 10.8 x10E3/uL 7.6 6.6 7.8  ?Hemoglobin 11.1 - 15.9 g/dL 16.7(H) 13.1 14.8  ?Hematocrit 34.0 - 46.6 % 47.9(H) 40.5 45.5  ?Platelets 150 - 450 x10E3/uL 159 95(L) 107(L)  ? ? ?Lab Results  ?Component Value Date/Time  ? VD25OH 31.2 08/27/2017 01:14 PM  ? VD25OH 35.8 11/16/2016 10:10 AM  ? ? ?Clinical ASCVD: No  ?The ASCVD Risk score (Arnett DK, et al., 2019) failed to calculate for the following reasons: ?  Cannot find a previous HDL lab ?  Cannot find a previous total cholesterol lab   ? ?Other: (CHADS2VASc if   Afib, PHQ9 if depression, MMRC or CAT for COPD, ACT, DEXA) ? ?Social History  ? ?Tobacco Use  ?Smoking Status Every Day  ? Types: Cigars  ?Smokeless Tobacco Never  ?Tobacco Comments  ? 8 small cigars a day  ? ?BP Readings from Last 3 Encounters:  ?03/18/21 123/73  ?10/22/20 127/69  ?10/16/20 120/74  ? ?Pulse Readings from Last 3 Encounters:  ?03/18/21 78  ?10/22/20 79  ?10/16/20 75  ? ?Wt Readings from Last 3 Encounters:  ?03/18/21 172 lb 6.4 oz (78.2 kg)  ?10/22/20 175 lb (79.4 kg)  ?10/16/20 172 lb (78 kg)  ? ? ?Assessment: Review of patient past medical history,  allergies, medications, health status, including review of consultants reports, laboratory and other test data, was performed as part of comprehensive evaluation and provision of chronic care management services.  ? ?SDOH:  (Social Determinants of Health) assessments and interventions performed:  ? ? ?CCM Care Plan ? ?Allergies  ?Allergen Reactions  ? Augmentin [Amoxicillin-Pot Clavulanate] Nausea And Vomiting  ? Latex Itching  ? ? ?Medications Reviewed Today   ? ? Reviewed by Pruitt, Julie D, RPH (Pharmacist) on 04/30/21 at 0946  Med List Status: <None>  ? ?Medication Order Taking? Sig Documenting Provider Last Dose Status Informant  ?atorvastatin (LIPITOR) 20 MG tablet [id number] No TAKE 1 TABLET BY MOUTH EVERY DAY Gottschalk, Ashly M, DO Taking Active   ?BD PEN NEEDLE NANO 2ND GEN 32G X 4 MM MISC [id number] No USE TO INJECT INSULIN DAILY AS DIRECTED. DX: E11.9 Gottschalk, Ashly M, DO Taking Active   ?Blood Glucose Monitoring Suppl (ACCU-CHEK GUIDE) w/Device KIT [id number] No Use to test blood sugar up to 4 times daily. DX: E11.9 Gottschalk, Ashly M, DO Taking Active   ?Calcium Carbonate-Vit D-Min (CALCIUM 1200 PO) [id number] No Take 1 tablet by mouth at bedtime.  [provider] Taking Active Self  ?cetirizine (ZYRTEC) 10 MG tablet [id number] No Take 10 mg by mouth daily. [provider] Taking Active Self  ?Cholecalciferol (VITAMIN D) 2000 UNITS CAPS [id number] No Take 2,000 Units by mouth at bedtime.  [provider] Taking Active Self  ?dapagliflozin propanediol (FARXIGA) 10 MG TABS tablet [id number] No Take 1 tablet (10 mg total) by mouth daily. Gottschalk, Ashly M, DO Taking Active   ?         ?Med Note (PRUITT, JULIE D   Mon Mar 24, 2021 10:09 AM) Via [place] patient [place]; escribe to [facility] for refills ?  ?escitalopram (LEXAPRO) 10 MG tablet [id number]  TAKE 1 TABLET BY MOUTH EVERY DAY Gottschalk, Ashly M, DO  Active   ?Ferrous Sulfate (IRON) 90 (18 Fe) MG TABS [id number] No  Take by mouth. [provider] Taking Active Self  ?glucose blood (ACCU-CHEK GUIDE) test strip [id number] No Use to test blood sugar up to 4 times daily. DX: E11.9 Gottschalk, Ashly M, DO Taking Active   ?Insulin Glargine (BASAGLAR KWIKPEN) 100 UNIT/ML [id number] No Inject 20 Units into the skin at bedtime. Sample #1pen LOT [id number], EXP 8/21 Gottschalk, Ashly M, DO Taking Active   ?         ?Med Note (PRUITT, JULIE D   Thu Jan 30, 2021  1:05 PM) VIA [facility] PATIENT ASSISTANCE PROGRAM  ?Lactobacillus-Inulin (CULTURELLE DIGESTIVE HEALTH) CAPS [id number]  Take 1 capsule daily for digestive health Gottschalk, Ashly M, DO  Active   ?Multiple Vitamins-Minerals (ONE-A-DAY VITACRAVES) CHEW [id number] No Chew by mouth. [provider] Taking Active   ?OneTouch Delica Lancets 30G MISC [id number]   No Use to test blood sugar daily as directed.  DX: E11.9  ?Patient taking differently: Use to test blood sugar daily as directed.  DX: E11.9  ? Gottschalk, Ashly M, DO Taking Active   ?Pumpkin Seed-Soy Germ (AZO BLADDER CONTROL/GO-LESS PO) [id number] No Take by mouth.  ?Patient not taking: Reported on 10/16/2020  ? [provider] Not Taking Active   ?sodium chloride (OCEAN) 0.65 % SOLN nasal spray [id number] No Place 1 spray into both nostrils as needed for congestion.  ?Patient not taking: Reported on 10/22/2020  ? Ijaola, Onyeje M, NP Not Taking Active   ? ?  ?  ? ?  ? ? ?Patient Active Problem List  ? Diagnosis Date Noted  ? Subacute frontal sinusitis 07/10/2020  ? Contact with and (suspected) exposure to covid-19 07/10/2020  ? Status post umbilical hernia repair, follow-up exam 06/01/2019  ? Incarcerated umbilical hernia   ? 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HCC)   ? Strangulated umbilical hernia   ? Genetic testing 04/21/2018  ? Family history of uterine cancer   ? Family history of pancreatic cancer   ? Family history of stomach cancer   ? Family history of kidney cancer   ? Family history of breast  cancer   ? Uterine cancer (HCC) 02/08/2018  ? Endometrial adenocarcinoma (HCC) 01/21/2018  ? Smokes cigars 12/06/2017  ? Nodular basal cell carcinoma (BCC) 09/15/2017  ? Foot callus 08/30/2017  ? Nonhealing skin u

## 2021-04-30 NOTE — Telephone Encounter (Signed)
Returned call ?Discussed medications with patient ?

## 2021-04-30 NOTE — Patient Instructions (Signed)
Visit Information ? ?Following are the goals we discussed today:  ?Current Barriers:  ?Unable to independently afford treatment regimen ?Unable to maintain control of T2DM--NEED PRESCRIPTION ASSISTANCE ?Suboptimal therapeutic regimen for T2DM ? ?Pharmacist Clinical Goal(s):  ?patient will verbalize ability to afford treatment regimen ?maintain control of T2DM, HLD as evidenced by GOAL A1C<7% AND GOAL LDL<70  ?adhere to plan to optimize therapeutic regimen for T2DM as evidenced by report of adherence to recommended medication management changes through collaboration with PharmD and provider.  ? ?Interventions: ?1:1 collaboration with [name], DO regarding development and update of comprehensive plan of care as evidenced by provider attestation and co-signature ?Inter-disciplinary care team collaboration (see longitudinal plan of care) ?Comprehensive medication review performed; medication list updated in electronic medical record ? ?Diabetes: Goal on Track (progressing): YES. ?Controlled- slight increase in A1c 6.3 --> 7.1%; GFR 91 ?CURRENT treatment:  BASAGLAR 16 UNITS DAILY, FARXIGA '10MG'$  (continue current regimen for now)  ?Previous treatments: JANUVIA WAS DISCONTINUED at last PCP visit in 2022 (may have to add back; onglyza would be easy to obtain if we can't do GLP1-GI history approx 1 yr ago; will address) ?[place] (FARXIGA) AND [facility] (BASAGLAR) ?BOTH WILL SHIP TO PATIENT'S HOME ?[facility] APPLICATION RE-FAXED ON 04/30/21--THEY DID NOT RECEIVE ?Current glucose readings: fasting glucose: <130, post prandial glucose: <180 ?Hasn't been able to check; I requested patient bring her glucometer to me and I will assist with set up and education ?Denies hypoglycemic/hyperglycemic symptoms ?Discussed meal planning options and Plate method for healthy eating ?Avoid sugary drinks and desserts ?Incorporate balanced protein, non starchy veggies, 1 serving of carbohydrate  with each meal ?Increase water intake ?Increase physical activity as able ?Current exercise: N/A--DIFFICULTY WALKING ?Educated on MEDICATIONS (PURPOSE AND SIDE EFFECTS); needs to check blood sugar ?Recommended BASAGLAR AND FARXIGA (AVOIDING GLP1 GIVEN GI HISTORY); may consider DPP4 if needed (would like to reduce insulin) ?Assessed patient finances. oPATIENT ASSISTANCE COMPLETED FOR [employer] (FARXIGA) AND [facility] (BASAGLAR) ? ?Hyperlipidemia:  Goal on Track (progressing): YES. ?Uncontrolled-LDL 74, almost at goal; current treatment:ATORVASTATIN; lipids due in august 2023 ?Medications previously tried: N/A  ?Current dietary patterns: RECOMMENDED HEART HEALTHY/HEALTHY PLATE METHOD ?Lipid Panel  ?   ?Component Value Date/Time  ? CHOL 124 08/27/2017 1314  ? TRIG 126 08/27/2017 1314  ? TRIG 108 05/09/2014 1403  ? HDL 53 08/27/2017 1314  ? HDL 51 05/09/2014 1403  ? CHOLHDL 2.3 08/27/2017 1314  ? LDLCALC 46 08/27/2017 1314  ? LDLDIRECT 74 10/16/2020 1509  ? LABVLDL 25 08/27/2017 1314  ? ?Patient Goals/Self-Care Activities ?patient will:  ?- take medications as prescribed as evidenced by patient report and record review ?check glucose DAILY FASTING OR IF SYMPTOMATIC, document, and provide at future appointments ?collaborate with provider on medication access solutions ?target a minimum of 150 minutes of moderate intensity exercise weekly ?engage in dietary modifications by FOLLOWING HEALTHY PLATE METHOD HANDOUT/heart healthy diet ? ? ?Plan: Telephone follow up appointment with care management team member scheduled for:  07/2021 ? ?Signature ?Regina Eck, PharmD, BCPS ?Clinical Pharmacist, [facility] ?[place]  [place] Phone [phone] ? ? ?Please call the care guide team at [phone] if you need to cancel or reschedule your appointment.  ? ?The patient verbalized understanding of instructions, educational materials, and care plan provided today and declined offer to receive copy of  patient instructions, educational materials, and care plan.  ? ?

## 2021-05-04 DIAGNOSIS — W540XXA Bitten by dog, initial encounter: Secondary | ICD-10-CM | POA: Diagnosis not present

## 2021-05-04 DIAGNOSIS — S81812A Laceration without foreign body, left lower leg, initial encounter: Secondary | ICD-10-CM | POA: Diagnosis not present

## 2021-05-04 DIAGNOSIS — S80812A Abrasion, left lower leg, initial encounter: Secondary | ICD-10-CM | POA: Diagnosis not present

## 2021-05-04 DIAGNOSIS — W548XXA Other contact with dog, initial encounter: Secondary | ICD-10-CM | POA: Diagnosis not present

## 2021-05-07 ENCOUNTER — Encounter: Payer: Self-pay | Admitting: Radiology

## 2021-05-09 ENCOUNTER — Ambulatory Visit (INDEPENDENT_AMBULATORY_CARE_PROVIDER_SITE_OTHER): Payer: Medicare PPO | Admitting: Family Medicine

## 2021-05-09 ENCOUNTER — Encounter: Payer: Self-pay | Admitting: Family Medicine

## 2021-05-09 VITALS — BP 135/75 | HR 76 | Temp 97.4°F | Ht 68.0 in | Wt 170.6 lb

## 2021-05-09 DIAGNOSIS — S80921A Unspecified superficial injury of right lower leg, initial encounter: Secondary | ICD-10-CM

## 2021-05-09 DIAGNOSIS — S80922A Unspecified superficial injury of left lower leg, initial encounter: Secondary | ICD-10-CM

## 2021-05-09 DIAGNOSIS — W548XXA Other contact with dog, initial encounter: Secondary | ICD-10-CM

## 2021-05-09 MED ORDER — SILVER SULFADIAZINE 1 % EX CREA: 1.0000 "application " | TOPICAL_CREAM | Freq: Every day | CUTANEOUS | 0 refills | Status: DC

## 2021-05-09 NOTE — Patient Instructions (Signed)
Use the bandage I gave you each morning and then remove each evening and put the prescription cream on it. ? ?Wound Care, Adult ?Taking care of your wound properly can help to prevent pain, infection, and scarring. It can also help your wound heal more quickly. Follow instructions from your health care provider about how to care for your wound. ?Supplies needed: ?Soap and water. ?Wound cleanser, saline, or germ-free (sterile) water. ?Gauze. ?If needed, a clean bandage (dressing) or other type of wound dressing material to cover or place in the wound. Follow your health care provider's instructions about what dressing supplies to use. ?Cream or topical ointment to apply to the wound, if told by your health care provider. ?How to care for your wound ?Cleaning the wound ?Ask your health care provider how to clean the wound. This may include: ?Using mild soap and water, a wound cleanser, saline, or sterile water. ?Using a clean gauze to pat the wound dry after cleaning it. Do not rub or scrub the wound. ?Dressing care ?Wash your hands with soap and water for at least 20 seconds before and after you change the dressing. If soap and water are not available, use hand sanitizer. ?Change your dressing as told by your health care provider. This may include: ?Cleaning or rinsing out (irrigating) the wound. ?Application of cream or topical ointment, if told by your health care provider. ?Placing a dressing over the wound or in the wound (packing). ?Covering the wound with an outer dressing. ?Leave stitches (sutures), staples, skin glue, or adhesive strips in place. These skin closures may need to stay in place for 2 weeks or longer. If adhesive strip edges start to loosen and curl up, you may trim the loose edges. Do not remove adhesive strips completely unless your health care provider tells you to do that. ?Ask your health care provider when you can leave the wound uncovered. ?Checking for infection ?Check your wound area  every day for signs of infection. Check for: ?More redness, swelling, or pain. ?Fluid or blood. ?Warmth. ?Pus or a bad smell. ? ?Follow these instructions at home ?Medicines ?If you were prescribed an antibiotic medicine, cream, or ointment, take or apply it as told by your health care provider. Do not stop using the antibiotic even if your condition improves. ?If you were prescribed pain medicine, take it 30 minutes before you do any wound care or as told by your health care provider. ?Take over-the-counter and prescription medicines only as told by your health care provider. ?Eating and drinking ?Eat a diet that includes protein, vitamin A, vitamin C, and other nutrient-rich foods to help the wound heal. ?Foods rich in protein include meat, fish, eggs, dairy, beans, and nuts. ?Foods rich in vitamin A include carrots and dark green, leafy vegetables. ?Foods rich in vitamin C include citrus fruits, tomatoes, broccoli, and peppers. ?Drink enough fluid to keep your urine pale yellow. ?General instructions ?Do not take baths, swim, or use a hot tub until your health care provider approves. Ask your health care provider if you may take showers. You may only be allowed to take sponge baths. ?Do not scratch or pick at the wound. Keep it covered as told by your health care provider. ?Return to your normal activities as told by your health care provider. Ask your health care provider what activities are safe for you. ?Protect your wound from the sun when you are outside for the first 6 months, or for as long as told by your  health care provider. Cover up the scar area or apply sunscreen that has an SPF of at least 44. ?Do not use any products that contain nicotine or tobacco. These products include cigarettes, chewing tobacco, and vaping devices, such as e-cigarettes. If you need help quitting, ask your health care provider. ?Keep all follow-up visits. This is important. ?Contact a health care provider if: ?You received a  tetanus shot and you have swelling, severe pain, redness, or bleeding at the injection site. ?Your pain is not controlled with medicine. ?You have any of these signs of infection: ?More redness, swelling, or pain around the wound. ?Fluid or blood coming from the wound. ?Warmth coming from the wound. ?A fever or chills. ?You are nauseous or you vomit. ?You are dizzy. ?You have a new rash or hardness around the wound. ?Get help right away if: ?You have a red streak of skin near the area around your wound. ?Pus or a bad smell coming from the wound. ?Your wound has been closed with staples, sutures, skin glue, or adhesive strips and it begins to open up and separate. ?Your wound is bleeding, and the bleeding does not stop with gentle pressure. ?These symptoms may represent a serious problem that is an emergency. Do not wait to see if the symptoms will go away. Get medical help right away. Call your local emergency services (911 in the [place] Do not drive yourself to the hospital. ?Summary ?Always wash your hands with soap and water for at least 20 seconds before and after changing your dressing. ?Change your dressing as told by your health care provider. ?To help with healing, eat foods that are rich in protein, vitamin A, vitamin C, and other nutrients. ?Check your wound every day for signs of infection. Contact your health care provider if you think that your wound is infected. ?This information is not intended to replace advice given to you by your health care provider. Make sure you discuss any questions you have with your health care provider. ?Document Revised: 06/11/2020 Document Reviewed: 06/11/2020 ?Elsevier Patient Education ? [employer]. ? ?

## 2021-05-09 NOTE — Progress Notes (Signed)
? ?Subjective: ?CC: Dog attack ?PCP: [name], DO ?NID:Debra Miranda is a 76 y.o. female presenting to clinic today for: ? ?1.  Dog attack ?Patient reports that she was attacked by a dog over the weekend.  She is not sure if it bit her.  When she was evaluated in the ER they thought that the lesions on the legs appear to be more consistent with scratches and not bites.  The dog has not been vaccinated against rabies so he is currently under quarantine.  She does not report any fevers, chills, nausea, vomiting.  She is currently being treated with Augmentin.  She notes some mild surrounding erythema on the lesions and wanted to have these looked at today.  She has been cleaning the areas with soapy water ? ? ?ROS: Per HPI ? ?Allergies  ?Allergen Reactions  ? Augmentin [Amoxicillin-Pot Clavulanate] Nausea And Vomiting  ? Latex Itching  ? ?Past Medical History:  ?Diagnosis Date  ? Arthritis   ? right hand  ? Cataract   ? RIGHT EYE; now removed  ? Depression   ? many years ago, no meds  ? Diabetes mellitus without complication ([place])   ? type 2  ? Endometrial adenocarcinoma ([place]) 01/05/2018  ? treated with HDR  ? Environmental allergies   ? Family history of breast cancer   ? Family history of kidney cancer   ? Family history of pancreatic cancer   ? Family history of stomach cancer   ? Family history of uterine cancer   ? Hemorrhoids   ? History of radiation therapy 04/05/18,02/25,3/3,3/10,3/18  ? 04/05/2018, 04/12/2018, 04/19/2018, 04/26/2018, 05/04/2018: Vagina, Iridium HDR, 3 cm cylinder, tx length of 3 cm / 30 Gy in 5 fractions.. Dr. Gery Pray  ? Nodular basal cell carcinoma (BCC) 08/2017  ? LEFT LOWER LEG above foot  ? Orthostatic hypotension   ? Patient is unaware of this diagnosis  ? ? ?Current Outpatient Medications:  ?  amoxicillin-clavulanate (AUGMENTIN) 875-125 MG tablet, Take 1 tablet by mouth 2 (two) times daily., Disp: , Rfl:  ?  atorvastatin (LIPITOR) 20 MG tablet, TAKE 1 TABLET BY MOUTH  EVERY DAY, Disp: 90 tablet, Rfl: 0 ?  BD PEN NEEDLE NANO 2ND GEN 32G X 4 MM MISC, USE TO INJECT INSULIN DAILY AS DIRECTED. DX: E11.9, Disp: 100 each, Rfl: 5 ?  Blood Glucose Monitoring Suppl (ACCU-CHEK GUIDE) w/Device KIT, Use to test blood sugar up to 4 times daily. DX: E11.9, Disp: 1 kit, Rfl: 0 ?  Calcium Carbonate-Vit D-Min (CALCIUM 1200 PO), Take 1 tablet by mouth at bedtime. , Disp: , Rfl:  ?  cetirizine (ZYRTEC) 10 MG tablet, Take 10 mg by mouth daily., Disp: , Rfl:  ?  Cholecalciferol (VITAMIN D) 2000 UNITS CAPS, Take 2,000 Units by mouth at bedtime. , Disp: , Rfl:  ?  dapagliflozin propanediol (FARXIGA) 10 MG TABS tablet, Take 1 tablet (10 mg total) by mouth daily., Disp: 90 tablet, Rfl: 4 ?  escitalopram (LEXAPRO) 10 MG tablet, TAKE 1 TABLET BY MOUTH EVERY DAY, Disp: 90 tablet, Rfl: 1 ?  Ferrous Sulfate (IRON) 90 (18 Fe) MG TABS, Take by mouth., Disp: , Rfl:  ?  glucose blood (ACCU-CHEK GUIDE) test strip, Use to test blood sugar up to 4 times daily. DX: E11.9, Disp: 100 each, Rfl: 12 ?  Insulin Glargine (BASAGLAR KWIKPEN) 100 UNIT/ML, Inject 20 Units into the skin at bedtime. Sample #1pen LOT [id number] AA, EXP 8/21 (Patient taking differently: Inject 16 Units into  the skin at bedtime. Sample #1pen LOT [id number] AA, EXP 8/21), Disp: 15 mL, Rfl: 3 ?  Lactobacillus-Inulin ([place]) CAPS, Take 1 capsule daily for digestive health, Disp: 90 capsule, Rfl: 3 ?  Multiple Vitamins-Minerals (ONE-A-DAY VITACRAVES) CHEW, Chew by mouth., Disp: , Rfl:  ?  OneTouch Delica Lancets 47W MISC, Use to test blood sugar daily as directed.  DX: E11.9 (Patient taking differently: Use to test blood sugar daily as directed.  DX: E11.9), Disp: 100 each, Rfl: 3 ?  Pumpkin Seed-Soy Germ (AZO BLADDER CONTROL/GO-LESS PO), Take by mouth., Disp: , Rfl:  ?  sodium chloride (OCEAN) 0.65 % SOLN nasal spray, Place 1 spray into both nostrils as needed for congestion., Disp: 30 mL, Rfl: 2 ?Social History  ? ?Socioeconomic  History  ? Marital status: Soil scientist  ?  Spouse name: [name]  ? Number of children: 1  ? Years of education: 30  ? Highest education level: Associate degree: occupational, Hotel manager, or vocational program  ?Occupational History  ? Occupation: Retired  ?  Comment: [employer]  ?Tobacco Use  ? Smoking status: Every Day  ?  Types: Cigars  ? Smokeless tobacco: Never  ? Tobacco comments:  ?  8 small cigars a day  ?Vaping Use  ? Vaping Use: Former  ? Start date: 02/16/2010  ? Quit date: 02/17/2011  ? Devices: unsure  ?Substance and Sexual Activity  ? Alcohol use: Not Currently  ? Drug use: No  ? Sexual activity: Not Currently  ?Other Topics Concern  ? Not on file  ?Social History Narrative  ? Not on file  ? ?Social Determinants of Health  ? ?Financial Resource Strain: Low Risk   ? Difficulty of Paying Living Expenses: Not hard at all  ?Food Insecurity: No Food Insecurity  ? Worried About Charity fundraiser in the Last Year: Never true  ? Ran Out of Food in the Last Year: Never true  ?Transportation Needs: No Transportation Needs  ? Lack of Transportation (Medical): No  ? Lack of Transportation (Non-Medical): No  ?Physical Activity: Insufficiently Active  ? Days of Exercise per Week: 7 days  ? Minutes of Exercise per Session: 20 min  ?Stress: No Stress Concern Present  ? Feeling of Stress : Not at all  ?Social Connections: Socially Integrated  ? Frequency of Communication with Friends and Family: More than three times a week  ? Frequency of Social Gatherings with Friends and Family: Three times a week  ? Attends Religious Services: More than 4 times per year  ? Active Member of Clubs or Organizations: Yes  ? Attends Archivist Meetings: More than 4 times per year  ? Marital Status: Living with partner  ?Intimate Partner Violence: Not At Risk  ? Fear of Current or Ex-Partner: No  ? Emotionally Abused: No  ? Physically Abused: No  ? Sexually Abused: No  ? ?Family History  ?Problem Relation Age of Onset  ?  Cervical cancer Mother 26  ?     treated with radiation  ? Pancreatic cancer Mother 23  ? Stroke Father   ? Pulmonary fibrosis Brother   ? Alzheimer's disease Brother   ? Stomach cancer Brother   ?     dx 42s  ? Diabetes Brother   ? Lung cancer Maternal Grandmother   ? Stroke Paternal Grandfather   ? Uterine cancer Maternal Aunt   ?     dx 31s  ? Kidney cancer Maternal Uncle   ?  dx 30s  ? Colon cancer Maternal Uncle   ?     dx 65s  ? Breast cancer Cousin   ?     maternal cousin dx 65  ? ? ?Objective: ?Office vital signs reviewed. ?BP 135/75   Pulse 76   Temp (!) 97.4 ?F (36.3 ?C)   Ht '5\' 8"'  (1.727 m)   Wt 170 lb 9.6 oz (77.4 kg)   SpO2 96%   BMI 25.94 kg/m?  ? ?Physical Examination:  ?Cardio: regular rate and rhythm, S1S2 heard, no murmurs appreciated ?Pulm: clear to auscultation bilaterally, no wheezes, rhonchi or rales; normal work of breathing on room air ?Skin: 5 skin tears that appear to be hemostatic and healing noted along the left anterior shin.  No overt puncture wounds appreciated.  No significant warmth, induration or exudates.  She has mild surrounding erythema ? ?Assessment/ Plan: ?76 y.o. female  ? ?Dog scratch - Plan: silver sulfADIAZINE (SILVADENE) 1 % cream ? ?She does have some mild redness without warmth or exudates.  Since this is a new issue change I am going to go ahead and place her on some Silvadene.  She is also been sent home with Xeroform bandages.  Home care instructions were reviewed with the patient.  Continue antibiotics as prescribed.  Still awaiting rabies status of the dog.  Red flag signs and symptoms reviewed and she understands reasons to be evaluated in the ER ? ?No orders of the defined types were placed in this encounter. ? ?No orders of the defined types were placed in this encounter. ? ? ? ?[name], DO ?[place] ?[phone] ? ? ?

## 2021-05-11 NOTE — Progress Notes (Signed)
?Radiation Oncology         [phone] ?________________________________ ? ?Name: Debra Miranda MRN: [id number]  ?Date: 05/12/2021  DOB: [birth year] ? ?Follow-Up Visit Note ? ?CC: [name], DO  Gottschalk, Ashly M, DO ? ?  ICD-10-CM   ?1. Endometrial adenocarcinoma ([place])  C54.1   ?  ? ? ?Diagnosis:  FIGO grade 2, Stage IA, Endometrial adenocarcinoma ([place]) ? ?Interval Since Last Radiation: 3 years, and 9 days  ? ?04/05/2018, 04/12/2018, 04/19/2018, 04/26/2018, 05/04/2018: Vagina, Iridium HDR, 3 cm cylinder, tx length of 3 cm / 30 Gy in 5 fractions. ? ?Narrative:  The patient returns today for routine annual follow-up.  Since her last visit on 05/13/20, the patient followed up with Dr. Denman George on 10/22/20. During which time, the patient was noted to have started intermittently self catheterizing for urinary retention which developed in 2021 after a hospitalization/surgery for strangulated bowel. Otherwise, the patient denied any symptoms concerning for disease recurrence, and was noted as NED on examination.        ? ?Pertinent imaging performed in the interval includes a bilateral screening mammogram on 11/13/20 which showed no evidence of malignancy.  ? ?Otherwise no significant interval history since the patient was last seen.  ? ?She continues to do in and out catheterization approximately 6 times per day.  She denies any hematuria or vaginal bleeding.  She denies any rectal bleeding pelvic pain or abdominal bloating. ? ?Recent history is significant for dog bite along the left lower leg.  She presented to urgent care and was placed on antibiotics.  She is being watched closely with this issue in light of her history of diabetes.                      ? ?Allergies:  is allergic to augmentin [amoxicillin-pot clavulanate] and latex. ? ?Meds: ?Current Outpatient Medications  ?Medication Sig Dispense Refill  ? amoxicillin-clavulanate (AUGMENTIN) 875-125 MG tablet Take 1 tablet by mouth 2 (two) times daily.     ? atorvastatin (LIPITOR) 20 MG tablet TAKE 1 TABLET BY MOUTH EVERY DAY 90 tablet 0  ? BD PEN NEEDLE NANO 2ND GEN 32G X 4 MM MISC USE TO INJECT INSULIN DAILY AS DIRECTED. DX: E11.9 100 each 5  ? Calcium Carbonate-Vit D-Min (CALCIUM 1200 PO) Take 1 tablet by mouth at bedtime.     ? Cholecalciferol (VITAMIN D) 2000 UNITS CAPS Take 2,000 Units by mouth at bedtime.     ? dapagliflozin propanediol (FARXIGA) 10 MG TABS tablet Take 1 tablet (10 mg total) by mouth daily. 90 tablet 4  ? escitalopram (LEXAPRO) 10 MG tablet TAKE 1 TABLET BY MOUTH EVERY DAY 90 tablet 1  ? Insulin Glargine (BASAGLAR KWIKPEN) 100 UNIT/ML Inject 20 Units into the skin at bedtime. Sample #1pen LOT [id number] AA, EXP 8/21 (Patient taking differently: Inject 16 Units into the skin at bedtime. Sample #1pen LOT [id number] AA, EXP 8/21) 15 mL 3  ? Lactobacillus-Inulin ([place]) CAPS Take 1 capsule daily for digestive health 90 capsule 3  ? Multiple Vitamins-Minerals (ONE-A-DAY VITACRAVES) CHEW Chew by mouth.    ? silver sulfADIAZINE (SILVADENE) 1 % cream Apply 1 application. topically daily. 50 g 0  ? Blood Glucose Monitoring Suppl (ACCU-CHEK GUIDE) w/Device KIT Use to test blood sugar up to 4 times daily. DX: E11.9 (Patient not taking: Reported on 05/12/2021) 1 kit 0  ? cetirizine (ZYRTEC) 10 MG tablet Take 10 mg by mouth daily. (Patient not taking: Reported on  05/12/2021)    ? Ferrous Sulfate (IRON) 90 (18 Fe) MG TABS Take by mouth. (Patient not taking: Reported on 05/12/2021)    ? glucose blood (ACCU-CHEK GUIDE) test strip Use to test blood sugar up to 4 times daily. DX: E11.9 (Patient not taking: Reported on 05/12/2021) 100 each 12  ? OneTouch Delica Lancets 32P MISC Use to test blood sugar daily as directed.  DX: E11.9 (Patient not taking: Reported on 05/12/2021) 100 each 3  ? Pumpkin Seed-Soy Germ (AZO BLADDER CONTROL/GO-LESS PO) Take by mouth. (Patient not taking: Reported on 05/12/2021)    ? sodium chloride (OCEAN) 0.65 % SOLN nasal  spray Place 1 spray into both nostrils as needed for congestion. (Patient not taking: Reported on 05/12/2021) 30 mL 2  ? ?No current facility-administered medications for this encounter.  ? ? ?Physical Findings: ?The patient is in no acute distress. Patient is alert and oriented. ? height is '5\' 8"'  (1.727 m) and weight is 172 lb (78 kg). Her temperature is 97.8 ?F (36.6 ?C). Her blood pressure is 139/72 and her pulse is 75. Her respiration is 20 and oxygen saturation is 98%. .  No significant changes. Lungs are clear to auscultation bilaterally. Heart has regular rate and rhythm. No palpable cervical, supraclavicular, or axillary adenopathy. Abdomen soft, non-tender, normal bowel sounds.  Bite marks noted along the left lower anterior leg area.  No signs of infection in this area. ? ?On pelvic examination the external genitalia were unremarkable. A speculum exam was performed. There are no mucosal lesions noted in the vaginal vault.  Some radiation changes noted to the vaginal cuff.  On bimanual and rectovaginal examination there were no pelvic masses appreciated. ? ? ? ?Lab Findings: ?Lab Results  ?Component Value Date  ? WBC 7.6 10/16/2020  ? HGB 16.7 (H) 10/16/2020  ? HCT 47.9 (H) 10/16/2020  ? MCV 89 10/16/2020  ? PLT 159 10/16/2020  ? ? ?Radiographic Findings: ?No results found. ? ?Impression: FIGO grade 2, Stage IA, Endometrial adenocarcinoma ([place]) ? ?No evidence of recurrence on clinical exam today.  Discussed with the patient since she is 3 years out from her treatment that she can stop using her vaginal dilator. ? ?Plan: Routine follow-up in 1 year.  The patient will follow-up with gynecologic oncology in 6 months. ? ? ?20 minutes of total time was spent for this patient encounter, including preparation, face-to-face counseling with the patient and coordination of care, physical exam, and documentation of the encounter. ?____________________________________ ? ?Blair Promise, PhD, MD ? ? ?This document  serves as a record of services personally performed by Gery Pray, MD. It was created on his behalf by Roney Mans, a trained medical scribe. The creation of this record is based on the scribe's personal observations and the provider's statements to them. This document has been checked and approved by the attending provider. ? ?

## 2021-05-12 ENCOUNTER — Encounter: Payer: Self-pay | Admitting: Radiation Oncology

## 2021-05-12 ENCOUNTER — Other Ambulatory Visit: Payer: Self-pay

## 2021-05-12 ENCOUNTER — Ambulatory Visit
Admission: RE | Admit: 2021-05-12 | Discharge: 2021-05-12 | Disposition: A | Discharge status: Home / Self Care | Payer: Medicare PPO | Source: Ambulatory Visit | Attending: Radiation Oncology | Admitting: Radiation Oncology

## 2021-05-12 VITALS — BP 139/72 | HR 75 | Temp 97.8°F | Resp 20 | Ht 68.0 in | Wt 172.0 lb

## 2021-05-12 DIAGNOSIS — R339 Retention of urine, unspecified: Secondary | ICD-10-CM | POA: Insufficient documentation

## 2021-05-12 DIAGNOSIS — Z08 Encounter for follow-up examination after completed treatment for malignant neoplasm: Secondary | ICD-10-CM | POA: Diagnosis not present

## 2021-05-12 DIAGNOSIS — Z7984 Long term (current) use of oral hypoglycemic drugs: Secondary | ICD-10-CM | POA: Insufficient documentation

## 2021-05-12 DIAGNOSIS — Z923 Personal history of irradiation: Secondary | ICD-10-CM | POA: Diagnosis not present

## 2021-05-12 DIAGNOSIS — Z8542 Personal history of malignant neoplasm of other parts of uterus: Secondary | ICD-10-CM | POA: Diagnosis not present

## 2021-05-12 DIAGNOSIS — C541 Malignant neoplasm of endometrium: Secondary | ICD-10-CM

## 2021-05-12 DIAGNOSIS — Z794 Long term (current) use of insulin: Secondary | ICD-10-CM | POA: Diagnosis not present

## 2021-05-12 NOTE — Progress Notes (Signed)
Debra Miranda is here today for follow up post radiation to the pelvis. ? ?They completed their radiation on: 05/04/18 ? ?Does the patient complain of any of the following: ? ?Pain:denies ?Abdominal bloating: denies ?Diarrhea/Constipation: denies ?Nausea/Vomiting: denies ?Vaginal Discharge: denies ?Blood in Urine or Stool: denies ?Urinary Issues (dysuria/incomplete emptying/ incontinence/ increased frequency/urgency): catheterizes, incontinence ?Does patient report using vaginal dilator 2-3 times a week and/or sexually active 2-3 weeks: using twice a week ?Post radiation skin changes: denies ? ? ?Additional comments if applicable: nothing of note ?  ?Vitals:  ? 05/12/21 1529  ?BP: 139/72  ?Pulse: 75  ?Resp: 20  ?Temp: 97.8 ?F (36.6 ?C)  ?SpO2: 98%  ?Weight: 172 lb (78 kg)  ?Height: '5\' 8"'$  (1.727 m)  ? ? ?

## 2021-05-14 DIAGNOSIS — H25812 Combined forms of age-related cataract, left eye: Secondary | ICD-10-CM | POA: Diagnosis not present

## 2021-05-14 DIAGNOSIS — H21562 Pupillary abnormality, left eye: Secondary | ICD-10-CM | POA: Diagnosis not present

## 2021-05-14 DIAGNOSIS — H2512 Age-related nuclear cataract, left eye: Secondary | ICD-10-CM | POA: Diagnosis not present

## 2021-05-16 DIAGNOSIS — E785 Hyperlipidemia, unspecified: Secondary | ICD-10-CM | POA: Diagnosis not present

## 2021-05-16 DIAGNOSIS — E119 Type 2 diabetes mellitus without complications: Secondary | ICD-10-CM | POA: Diagnosis not present

## 2021-05-16 DIAGNOSIS — Z794 Long term (current) use of insulin: Secondary | ICD-10-CM

## 2021-07-08 ENCOUNTER — Telehealth: Payer: Self-pay | Admitting: Family Medicine

## 2021-07-08 NOTE — Telephone Encounter (Signed)
Please let patient know I left her pen needles up front for pick up :) Box of #[phone]  Thank you!

## 2021-07-08 NOTE — Telephone Encounter (Signed)
Pt aware.

## 2021-07-11 ENCOUNTER — Other Ambulatory Visit: Payer: Self-pay | Admitting: Family Medicine

## 2021-07-11 DIAGNOSIS — E1169 Type 2 diabetes mellitus with other specified complication: Secondary | ICD-10-CM

## 2021-07-23 ENCOUNTER — Ambulatory Visit: Payer: Medicare PPO

## 2021-07-28 ENCOUNTER — Ambulatory Visit: Payer: Medicare PPO

## 2021-07-29 ENCOUNTER — Telehealth: Payer: Self-pay

## 2021-07-29 ENCOUNTER — Telehealth: Payer: Self-pay | Admitting: *Deleted

## 2021-07-29 NOTE — Telephone Encounter (Signed)
CALLED PATIENT TO INFORM OF FU APPT. WITH DR. Berline Lopes ON 10-23-21- ARRIVAL TIME- 12:45 PM, LVM FOR A RETURN CALL

## 2021-07-29 NOTE — Telephone Encounter (Signed)
Enid Derry from (RAD ON[facility]) called office.  Pt is scheduled for a follow up on 10/23/2021  at 1:15pm with Dr.Tucker.  Enid Derry to notify pt of appointment date and time.

## 2021-07-31 ENCOUNTER — Ambulatory Visit: Payer: Medicare PPO | Admitting: Pharmacist

## 2021-07-31 DIAGNOSIS — E119 Type 2 diabetes mellitus without complications: Secondary | ICD-10-CM

## 2021-07-31 DIAGNOSIS — E1169 Type 2 diabetes mellitus with other specified complication: Secondary | ICD-10-CM

## 2021-08-04 ENCOUNTER — Telehealth: Payer: Self-pay | Admitting: Family Medicine

## 2021-08-04 DIAGNOSIS — L84 Corns and callosities: Secondary | ICD-10-CM

## 2021-08-04 DIAGNOSIS — Z872 Personal history of diseases of the skin and subcutaneous tissue: Secondary | ICD-10-CM

## 2021-08-04 NOTE — Telephone Encounter (Signed)
done

## 2021-08-04 NOTE — Telephone Encounter (Signed)
REFERRAL REQUEST Telephone Note  Have you been seen at our office for this problem? YES (Advise that they may need an appointment with their PCP before a referral can be done)  Reason for Referral: Spot on bottom right foot Referral discussed with patient: YES  Best contact number of patient for referral team: [phone]    Has patient been seen by a specialist for this issue before: NO  Patient provider preference for referral: Dr. Irving Shows Patient location preference for referral: [facility]   Patient notified that referrals can take up to a week or longer to process. If they haven't heard anything within a week they should call back and speak with the referral department.

## 2021-08-13 ENCOUNTER — Ambulatory Visit (INDEPENDENT_AMBULATORY_CARE_PROVIDER_SITE_OTHER): Payer: Medicare PPO

## 2021-08-13 VITALS — Wt 172.0 lb

## 2021-08-13 DIAGNOSIS — Z0000 Encounter for general adult medical examination without abnormal findings: Secondary | ICD-10-CM | POA: Diagnosis not present

## 2021-08-13 NOTE — Patient Instructions (Addendum)
Debra Miranda , Thank you for taking time to come for your Medicare Wellness Visit. I appreciate your ongoing commitment to your health goals. Please review the following plan we discussed and let me know if I can assist you in the future.   Screening recommendations/referrals: Colonoscopy: Cologuard done 02/06/2020 - repeat in 3 yeasr Mammogram: Done 11/13/2020 - Repeat annually Bone Density: Done 04/03/2019 - Repeat every 2 years *due Recommended yearly ophthalmology/optometry visit for glaucoma screening and checkup Recommended yearly dental visit for hygiene and checkup  Vaccinations: Influenza vaccine: Done 11/20/2020 - Repeat annually  Pneumococcal vaccine: Done 03/17/2013 & 02/09/2018   Tdap vaccine: Due - recommended every 10 years Shingles vaccine: Done 03/18/2021 - get second dose at next visit   Covid-19: Done 04/16/2019, 05/16/2019, 9/27/221, 05/23/2020, 11/20/2020, & 07/08/2021  Advanced directives: in chart  Conditions/risks identified: Aim for 30 minutes of exercise or brisk walking, 6-8 glasses of water, and 5 servings of fruits and vegetables each day.   Next appointment: Follow up in one year for your annual wellness visit    Preventive Care 65 Years and Older, Female Preventive care refers to lifestyle choices and visits with your health care provider that can promote health and wellness. What does preventive care include? A yearly physical exam. This is also called an annual well check. Dental exams once or twice a year. Routine eye exams. Ask your health care provider how often you should have your eyes checked. Personal lifestyle choices, including: Daily care of your teeth and gums. Regular physical activity. Eating a healthy diet. Avoiding tobacco and drug use. Limiting alcohol use. Practicing safe sex. Taking low-dose aspirin every day. Taking vitamin and mineral supplements as recommended by your health care provider. What happens during an annual well check? The  services and screenings done by your health care provider during your annual well check will depend on your age, overall health, lifestyle risk factors, and family history of disease. Counseling  Your health care provider may ask you questions about your: Alcohol use. Tobacco use. Drug use. Emotional well-being. Home and relationship well-being. Sexual activity. Eating habits. History of falls. Memory and ability to understand (cognition). Work and work Statistician. Reproductive health. Screening  You may have the following tests or measurements: Height, weight, and BMI. Blood pressure. Lipid and cholesterol levels. These may be checked every 5 years, or more frequently if you are over 71 years old. Skin check. Lung cancer screening. You may have this screening every year starting at age 26 if you have a 30-pack-year history of smoking and currently smoke or have quit within the past 15 years. Fecal occult blood test (FOBT) of the stool. You may have this test every year starting at age 43. Flexible sigmoidoscopy or colonoscopy. You may have a sigmoidoscopy every 5 years or a colonoscopy every 10 years starting at age 18. Hepatitis C blood test. Hepatitis B blood test. Sexually transmitted disease (STD) testing. Diabetes screening. This is done by checking your blood sugar (glucose) after you have not eaten for a while (fasting). You may have this done every 1-3 years. Bone density scan. This is done to screen for osteoporosis. You may have this done starting at age 37. Mammogram. This may be done every 1-2 years. Talk to your health care provider about how often you should have regular mammograms. Talk with your health care provider about your test results, treatment options, and if necessary, the need for more tests. Vaccines  Your health care provider may recommend  certain vaccines, such as: Influenza vaccine. This is recommended every year. Tetanus, diphtheria, and acellular  pertussis (Tdap, Td) vaccine. You may need a Td booster every 10 years. Zoster vaccine. You may need this after age 40. Pneumococcal 13-valent conjugate (PCV13) vaccine. One dose is recommended after age 27. Pneumococcal polysaccharide (PPSV23) vaccine. One dose is recommended after age 49. Talk to your health care provider about which screenings and vaccines you need and how often you need them. This information is not intended to replace advice given to you by your health care provider. Make sure you discuss any questions you have with your health care provider. Document Released: 03/01/2015 Document Revised: 10/23/2015 Document Reviewed: 12/04/2014 Elsevier Interactive Patient Education  2017 [place] Prevention in the Home Falls can cause injuries. They can happen to people of all ages. There are many things you can do to make your home safe and to help prevent falls. What can I do on the outside of my home? Regularly fix the edges of walkways and driveways and fix any cracks. Remove anything that might make you trip as you walk through a door, such as a raised step or threshold. Trim any bushes or trees on the path to your home. Use bright outdoor lighting. Clear any walking paths of anything that might make someone trip, such as rocks or tools. Regularly check to see if handrails are loose or broken. Make sure that both sides of any steps have handrails. Any raised decks and porches should have guardrails on the edges. Have any leaves, snow, or ice cleared regularly. Use sand or salt on walking paths during winter. Clean up any spills in your garage right away. This includes oil or grease spills. What can I do in the bathroom? Use night lights. Install grab bars by the toilet and in the tub and shower. Do not use towel bars as grab bars. Use non-skid mats or decals in the tub or shower. If you need to sit down in the shower, use a plastic, non-slip stool. Keep the floor  dry. Clean up any water that spills on the floor as soon as it happens. Remove soap buildup in the tub or shower regularly. Attach bath mats securely with double-sided non-slip rug tape. Do not have throw rugs and other things on the floor that can make you trip. What can I do in the bedroom? Use night lights. Make sure that you have a light by your bed that is easy to reach. Do not use any sheets or blankets that are too big for your bed. They should not hang down onto the floor. Have a firm chair that has side arms. You can use this for support while you get dressed. Do not have throw rugs and other things on the floor that can make you trip. What can I do in the kitchen? Clean up any spills right away. Avoid walking on wet floors. Keep items that you use a lot in easy-to-reach places. If you need to reach something above you, use a strong step stool that has a grab bar. Keep electrical cords out of the way. Do not use floor polish or wax that makes floors slippery. If you must use wax, use non-skid floor wax. Do not have throw rugs and other things on the floor that can make you trip. What can I do with my stairs? Do not leave any items on the stairs. Make sure that there are handrails on both sides of the  stairs and use them. Fix handrails that are broken or loose. Make sure that handrails are as long as the stairways. Check any carpeting to make sure that it is firmly attached to the stairs. Fix any carpet that is loose or worn. Avoid having throw rugs at the top or bottom of the stairs. If you do have throw rugs, attach them to the floor with carpet tape. Make sure that you have a light switch at the top of the stairs and the bottom of the stairs. If you do not have them, ask someone to add them for you. What else can I do to help prevent falls? Wear shoes that: Do not have high heels. Have rubber bottoms. Are comfortable and fit you well. Are closed at the toe. Do not wear  sandals. If you use a stepladder: Make sure that it is fully opened. Do not climb a closed stepladder. Make sure that both sides of the stepladder are locked into place. Ask someone to hold it for you, if possible. Clearly mark and make sure that you can see: Any grab bars or handrails. First and last steps. Where the edge of each step is. Use tools that help you move around (mobility aids) if they are needed. These include: Canes. Walkers. Scooters. Crutches. Turn on the lights when you go into a dark area. Replace any light bulbs as soon as they burn out. Set up your furniture so you have a clear path. Avoid moving your furniture around. If any of your floors are uneven, fix them. If there are any pets around you, be aware of where they are. Review your medicines with your doctor. Some medicines can make you feel dizzy. This can increase your chance of falling. Ask your doctor what other things that you can do to help prevent falls. This information is not intended to replace advice given to you by your health care provider. Make sure you discuss any questions you have with your health care provider. Document Released: 11/29/2008 Document Revised: 07/11/2015 Document Reviewed: 03/09/2014 Elsevier Interactive Patient Education  2017 [employer]   Diabetes Mellitus and [place] care is an important part of your health, especially when you have diabetes. Diabetes may cause you to have problems because of poor blood flow (circulation) to your feet and legs, which can cause your skin to: Become thinner and drier. Break more easily. Heal more slowly. Peel and crack. You may also have nerve damage (neuropathy) in your legs and feet, causing decreased feeling in them. This means that you may not notice minor injuries to your feet that could lead to more serious problems. Noticing and addressing any potential problems early is the best way to prevent future foot problems. How to care  for your feet Foot hygiene  Wash your feet daily with warm water and mild soap. Do not use hot water. Then, pat your feet and the areas between your toes until they are completely dry. Do not soak your feet as this can dry your skin. Trim your toenails straight across. Do not dig under them or around the cuticle. File the edges of your nails with an emery board or nail file. Apply a moisturizing lotion or petroleum jelly to the skin on your feet and to dry, brittle toenails. Use lotion that does not contain alcohol and is unscented. Do not apply lotion between your toes. Shoes and socks Wear clean socks or stockings every day. Make sure they are not too tight. Do  not wear knee-high stockings since they may decrease blood flow to your legs. Wear shoes that fit properly and have enough cushioning. Always look in your shoes before you put them on to be sure there are no objects inside. To break in new shoes, wear them for just a few hours a day. This prevents injuries on your feet. Wounds, scrapes, corns, and calluses  Check your feet daily for blisters, cuts, bruises, sores, and redness. If you cannot see the bottom of your feet, use a mirror or ask someone for help. Do not cut corns or calluses or try to remove them with medicine. If you find a minor scrape, cut, or break in the skin on your feet, keep it and the skin around it clean and dry. You may clean these areas with mild soap and water. Do not clean the area with peroxide, alcohol, or iodine. If you have a wound, scrape, corn, or callus on your foot, look at it several times a day to make sure it is healing and not infected. Check for: Redness, swelling, or pain. Fluid or blood. Warmth. Pus or a bad smell. General tips Do not cross your legs. This may decrease blood flow to your feet. Do not use heating pads or hot water bottles on your feet. They may burn your skin. If you have lost feeling in your feet or legs, you may not know this is  happening until it is too late. Protect your feet from hot and cold by wearing shoes, such as at the beach or on hot pavement. Schedule a complete foot exam at least once a year (annually) or more often if you have foot problems. Report any cuts, sores, or bruises to your health care provider immediately. Where to find more information American Diabetes Association: www.diabetes.org Association of Diabetes Care & Education Specialists: www.diabeteseducator.org Contact a health care provider if: You have a medical condition that increases your risk of infection and you have any cuts, sores, or bruises on your feet. You have an injury that is not healing. You have redness on your legs or feet. You feel burning or tingling in your legs or feet. You have pain or cramps in your legs and feet. Your legs or feet are numb. Your feet always feel cold. You have pain around any toenails. Get help right away if: You have a wound, scrape, corn, or callus on your foot and: You have pain, swelling, or redness that gets worse. You have fluid or blood coming from the wound, scrape, corn, or callus. Your wound, scrape, corn, or callus feels warm to the touch. You have pus or a bad smell coming from the wound, scrape, corn, or callus. You have a fever. You have a red line going up your leg. Summary Check your feet every day for blisters, cuts, bruises, sores, and redness. Apply a moisturizing lotion or petroleum jelly to the skin on your feet and to dry, brittle toenails. Wear shoes that fit properly and have enough cushioning. If you have foot problems, report any cuts, sores, or bruises to your health care provider immediately. Schedule a complete foot exam at least once a year (annually) or more often if you have foot problems. This information is not intended to replace advice given to you by your health care provider. Make sure you discuss any questions you have with your health care  provider. Document Revised: 08/24/2019 Document Reviewed: 08/24/2019 Elsevier Patient Education  [place].

## 2021-08-13 NOTE — Progress Notes (Signed)
Subjective:   Debra Miranda is a 76 y.o. female who presents for Medicare Annual (Subsequent) preventive examination.  Virtual Visit via Telephone Note  I connected with  Debra Miranda on 08/13/21 at  3:30 PM EDT by telephone and verified that I am speaking with the correct person using two identifiers.  Location: Patient: Home Provider: [facility] Persons participating in the virtual visit: patient/Nurse Health Advisor   I discussed the limitations, risks, security and privacy concerns of performing an evaluation and management service by telephone and the availability of in person appointments. The patient expressed understanding and agreed to proceed.  Interactive audio and video telecommunications were attempted between this nurse and patient, however failed, due to patient having technical difficulties OR patient did not have access to video capability.  We continued and completed visit with audio only.  Some vital signs may be absent or patient reported.   Debra Marin E Ameera Tigue, LPN   Review of Systems     Cardiac Risk Factors include: advanced age (>32mn, >>15women);diabetes mellitus;dyslipidemia;smoking/ tobacco exposure;sedentary lifestyle     Objective:    Today's Vitals   08/13/21 1530 08/13/21 1532  Weight: 172 lb (78 kg)   PainSc:  4    Body mass index is 26.15 kg/m.     08/13/2021    3:44 PM 05/12/2021    3:45 PM 05/13/2020    3:16 PM 11/13/2019    4:11 PM 06/01/2019    4:26 PM 06/01/2019    8:27 AM 05/08/2019    3:09 PM  Advanced Directives  Does Patient Have a Medical Advance Directive? Yes Yes Yes Yes No Yes Yes  Type of [place]amed[place]of [place] will [place] will Living will Living will  [place] will Living will  Does patient want to make changes to medical advance directive?  No - Patient declined No - Patient declined Yes (ED - Information included in AVS)     Copy of HAccidentin Chart? Yes - validated most recent copy scanned in chart (See row information) Yes - validated most recent copy scanned in chart (See row information)       Would patient like information on creating a medical advance directive?     No - Patient declined      Current Medications (verified) Outpatient Encounter Medications as of 08/13/2021  Medication Sig   atorvastatin (LIPITOR) 20 MG tablet TAKE 1 TABLET BY MOUTH EVERY DAY   BD PEN NEEDLE NANO 2ND GEN 32G X 4 MM MISC USE TO INJECT INSULIN DAILY AS DIRECTED. DX: E11.9   Calcium Carbonate-Vit D-Min (CALCIUM 1200 PO) Take 1 tablet by mouth at bedtime.    dapagliflozin propanediol (FARXIGA) 10 MG TABS tablet Take 1 tablet (10 mg total) by mouth daily.   escitalopram (LEXAPRO) 10 MG tablet TAKE 1 TABLET BY MOUTH EVERY DAY   Insulin Glargine (BASAGLAR KWIKPEN) 100 UNIT/ML Inject 20 Units into the skin at bedtime. Sample #1pen LOT [id number], EXP 8/21 (Patient taking differently: Inject 16 Units into the skin at bedtime. Sample #1pen LOT [id number], EXP 8/21)   Lactobacillus-Inulin ([place] CAPS Take 1 capsule daily for digestive health   Multiple Vitamins-Minerals (ONE-A-DAY VITACRAVES) CHEW Chew by mouth. (Patient not taking: Reported on 08/13/2021)   [DISCONTINUED] amoxicillin-clavulanate (AUGMENTIN) 875-125 MG tablet Take 1 tablet by mouth 2 (two) times daily.   [DISCONTINUED] Blood Glucose Monitoring Suppl (ACCU-CHEK GUIDE) w/Device KIT Use to test blood  sugar up to 4 times daily. DX: E11.9 (Patient not taking: Reported on 05/12/2021)   [DISCONTINUED] cetirizine (ZYRTEC) 10 MG tablet Take 10 mg by mouth daily. (Patient not taking: Reported on 05/12/2021)   [DISCONTINUED] Cholecalciferol (VITAMIN D) 2000 UNITS CAPS Take 2,000 Units by mouth at bedtime.  (Patient not taking: Reported on 08/13/2021)   [DISCONTINUED] Ferrous Sulfate (IRON) 90 (18 Fe) MG TABS Take by mouth. (Patient not taking: Reported on 05/12/2021)    [DISCONTINUED] glucose blood (ACCU-CHEK GUIDE) test strip Use to test blood sugar up to 4 times daily. DX: E11.9 (Patient not taking: Reported on 05/12/2021)   [DISCONTINUED] OneTouch Delica Lancets 36R MISC Use to test blood sugar daily as directed.  DX: E11.9 (Patient not taking: Reported on 05/12/2021)   [DISCONTINUED] Pumpkin Seed-Soy Germ (AZO BLADDER CONTROL/GO-LESS PO) Take by mouth. (Patient not taking: Reported on 05/12/2021)   [DISCONTINUED] silver sulfADIAZINE (SILVADENE) 1 % cream Apply 1 application. topically daily.   [DISCONTINUED] sodium chloride (OCEAN) 0.65 % SOLN nasal spray Place 1 spray into both nostrils as needed for congestion. (Patient not taking: Reported on 05/12/2021)   No facility-administered encounter medications on file as of 08/13/2021.    Allergies (verified) Augmentin [amoxicillin-pot clavulanate] and Latex   History: Past Medical History:  Diagnosis Date   Arthritis    right hand   Cataract    RIGHT EYE; now removed   Depression    many years ago, no meds   Diabetes mellitus without complication ([place])    type 2   Endometrial adenocarcinoma ([place]) 01/05/2018   treated with HDR   Environmental allergies    Family history of breast cancer    Family history of kidney cancer    Family history of pancreatic cancer    Family history of stomach cancer    Family history of uterine cancer    Hemorrhoids    History of radiation therapy 04/05/18,02/25,3/3,3/10,3/18   04/05/2018, 04/12/2018, 04/19/2018, 04/26/2018, 05/04/2018: Vagina, Iridium HDR, 3 cm cylinder, tx length of 3 cm / 30 Gy in 5 fractions.. Dr. Gery Pray   Nodular basal cell carcinoma ([place]) 08/2017   LEFT LOWER LEG above foot   Orthostatic hypotension    Patient is unaware of this diagnosis   Past Surgical History:  Procedure Laterality Date   ABDOMINAL HYSTERECTOMY     Dr. Gerarda Fraction 02-08-18    BOWEL RESECTION N/A 06/01/2019   Procedure: SMALL BOWEL RESECTION;  Surgeon: Virl Cagey,  MD;  Location: [facility];  Service: General;  Laterality: N/A;   CATARACT EXTRACTION     RIGHT EYE   HYSTEROSCOPY WITH D & C N/A 01/05/2018   Procedure: DILATATION AND CURETTAGE/HYSTEROSCOPY;  Surgeon: Florian Buff, MD;  Location: [facility];  Service: Gynecology;  Laterality: N/A;   LUMBAR DISC SURGERY  2006   OMENTECTOMY N/A 06/01/2019   Procedure: OMENTECTOMY;  Surgeon: Virl Cagey, MD;  Location: [facility];  Service: General;  Laterality: N/A;   POLYPECTOMY N/A 01/05/2018   Procedure: ENDOMETRIAL POLYPECTOMY;  Surgeon: Florian Buff, MD;  Location: [facility];  Service: Gynecology;  Laterality: N/A;   ROBOTIC ASSISTED TOTAL HYSTERECTOMY WITH BILATERAL SALPINGO OOPHERECTOMY Bilateral 02/08/2018   Procedure: XI ROBOTIC ASSISTED TOTAL HYSTERECTOMY WITH BILATERAL SALPINGO OOPHORECTOMY;  Surgeon: Isabel Caprice, MD;  Location: [facility];  Service: Gynecology;  Laterality: Bilateral;   SENTINEL NODE BIOPSY Bilateral 02/08/2018   Procedure: SENTINEL NODE BIOPSY AND PELVIC LYMPH NODE SAMPLING;  Surgeon: Isabel Caprice, MD;  Location: [facility];  Service: Gynecology;  Laterality: Bilateral;   TONSILLECTOMY     as a child   UMBILICAL HERNIA REPAIR N/A 06/01/2019   Procedure: HERNIA REPAIR UMBILICAL ADULT;  Surgeon: Virl Cagey, MD;  Location: [facility];  Service: General;  Laterality: N/A;   Family History  Problem Relation Age of Onset   Cervical cancer Mother 41       treated with radiation   Pancreatic cancer Mother 68   Stroke Father    Pulmonary fibrosis Brother    Alzheimer's disease Brother    Stomach cancer Brother        dx 71s   Diabetes Brother    Lung cancer Maternal Grandmother    Stroke Paternal Grandfather    Uterine cancer Maternal Aunt        dx 77s   Kidney cancer Maternal Uncle        dx 54s   Colon cancer Maternal Uncle        dx 1s   Breast cancer Cousin        maternal cousin dx 47   Social History   Socioeconomic History   Marital status: Widowed    Spouse  name: Not on file   Number of children: 1   Years of education: 14   Highest education level: Associate degree: occupational, Hotel manager, or vocational program  Occupational History   Occupation: Retired    Comment: [employer]  Tobacco Use   Smoking status: Every Day    Types: Cigars   Smokeless tobacco: Never   Tobacco comments:    4 small cigars a day  Vaping Use   Vaping Use: Former   Start date: 02/16/2010   Quit date: 02/17/2011   Devices: unsure  Substance and Sexual Activity   Alcohol use: Not Currently   Drug use: No   Sexual activity: Not Currently  Other Topics Concern   Not on file  Social History Narrative   Her daughter lives with her and helps with bills and ride if she needs   Patient is pretty independent, but sometimes has excessive pain and needs assistance   Social Determinants of Health   Financial Resource Strain: Low Risk  (08/13/2021)   Overall Financial Resource Strain (CARDIA)    Difficulty of Paying Living Expenses: Not hard at all  Food Insecurity: No Food Insecurity (08/13/2021)   Hunger Vital Sign    Worried About Running Out of Food in the Last Year: Never true    [place] in the Last Year: Never true  Transportation Needs: No Transportation Needs (08/13/2021)   PRAPARE - Hydrologist (Medical): No    Lack of Transportation (Non-Medical): No  Physical Activity: Inactive (08/13/2021)   Exercise Vital Sign    Days of Exercise per Week: 0 days    Minutes of Exercise per Session: 0 min  Stress: No Stress Concern Present (08/13/2021)   [place]    Feeling of Stress : Not at all  Social Connections: Moderately Integrated (08/13/2021)   Social Connection and Isolation Panel [NHANES]    Frequency of Communication with Friends and Family: More than three times a week    Frequency of Social Gatherings with Friends and Family: More than three times a  week    Attends Religious Services: More than 4 times per year    Active Member of [employer] or Organizations: Yes    Attends Archivist  Meetings: More than 4 times per year    Marital Status: Widowed    Tobacco Counseling Ready to quit: No Counseling given: Yes Tobacco comments: 4 small cigars a day   Clinical Intake:  Pre-visit preparation completed: Yes  Pain : 0-10 Pain Score: 4  Pain Type: Chronic pain Pain Location: Foot Pain Orientation: Right Pain Descriptors / Indicators: Sore, Tender Pain Onset: More than a month ago Pain Frequency: Intermittent     BMI - recorded: 26.15 Nutritional Status: BMI 25 -29 Overweight Nutritional Risks: None Diabetes: Yes CBG done?: No Did pt. bring in CBG monitor from home?: No  How often do you need to have someone help you when you read instructions, pamphlets, or other written materials from your doctor or pharmacy?: 1 - Never  Diabetic? Nutrition Risk Assessment:  Has the patient had any N/V/D within the last 2 months?  No  Does the patient have any non-healing wounds?  Yes  - she has a sore on heel of right foot - she has appt with Dr Irving Shows next month - will keep an eye on it to make sure it doesn't get worse or infected Has the patient had any unintentional weight loss or weight gain?  No   Diabetes:  Is the patient diabetic?  Yes  If diabetic, was a CBG obtained today?  No  Did the patient bring in their glucometer from home?  No  How often do you monitor your CBG's? never.   Financial Strains and Diabetes Management:  Are you having any financial strains with the device, your supplies or your medication? No .  Does the patient want to be seen by Chronic Care Management for management of their diabetes?  No  Would the patient like to be referred to a Nutritionist or for Diabetic Management?  No   Diabetic Exams:  Diabetic Eye Exam: Completed 04/16/2020. Overdue for diabetic eye exam. Pt has been advised about  the importance in completing this exam. She will make appt soon.  Diabetic Foot Exam: Completed 01/16/2020. Pt has been advised about the importance in completing this exam. Pt is scheduled for diabetic foot exam on 09/14/2021.    Interpreter Needed?: No  Information entered by :: Bonny Egger, LPN   Activities of Daily Living    08/13/2021    3:41 PM  In your present state of health, do you have any difficulty performing the following activities:  Hearing? 0  Vision? 0  Difficulty concentrating or making decisions? 0  Walking or climbing stairs? 0  Dressing or bathing? 0  Doing errands, shopping? 0  Preparing Food and eating ? N  Using the Toilet? N  In the past six months, have you accidently leaked urine? Y  Comment self-catheterizes  Do you have problems with loss of bowel control? Y  Comment none if she takes probiotics  Managing your Medications? N  Managing your Finances? N  Housekeeping or managing your Housekeeping? N    Patient Care Team: [name], DO as PCP - General (Family Medicine) Lavera Guise, [facility] (Pharmacist) Robley Fries, MD as Consulting Physician (Urology) Jola Schmidt, MD as Consulting Physician (Ophthalmology)  Indicate any recent Medical Services you may have received from other than [facility] providers in the past year (date may be approximate).     Assessment:   This is a routine wellness examination for [facility].  Hearing/Vision screen Hearing Screening - Comments:: Denies hearing difficulties   Vision Screening - Comments:: Wears rx glasses -  up to date with routine eye exams with Dr Valetta Close  Dietary issues and exercise activities discussed: Current Exercise Habits: The patient does not participate in regular exercise at present, Exercise limited by: Other - see comments (diabetic foot ulcer)   Goals Addressed               This Visit's Progress     Exercise 150 minutes per week (moderate activity)        Try to increase  physical activity to 30 minutes daily      T2DM, HLD PHARMD GOAL (pt-stated)   On track     Current Barriers:  Unable to independently afford treatment regimen Unable to maintain control of T2DM--NEED PRESCRIPTION ASSISTANCE Suboptimal therapeutic regimen for T2DM  Pharmacist Clinical Goal(s):  patient will verbalize ability to afford treatment regimen maintain control of T2DM, HLD as evidenced by GOAL A1C<7% AND GOAL LDL<70  adhere to plan to optimize therapeutic regimen for T2DM as evidenced by report of adherence to recommended medication management changes through collaboration with PharmD and provider.   Interventions: 1:1 collaboration with [name], DO regarding development and update of comprehensive plan of care as evidenced by provider attestation and co-signature Inter-disciplinary care team collaboration (see longitudinal plan of care) Comprehensive medication review performed; medication list updated in electronic medical record  Diabetes: Goal on Track (progressing): YES. Controlled- slight increase in A1c 6.3 --> 7.1%; GFR 91 CURRENT treatment:  BASAGLAR 16 UNITS DAILY, FARXIGA 10MG (continue current regimen for now)  Previous treatments: JANUVIA WAS DISCONTINUED at last PCP visit in 2022 (may have to add back; onglyza would be easy to obtain if we can't do GLP1-GI history approx 1 yr ago; will address) 2023 PATIENT ASSISTANCE COMPLETED FOR AZ&ME (FARXIGA) AND LILLY CARES (BASAGLAR) BOTH WILL SHIP TO PATIENT'S HOME LILLY CARES APPLICATION RE-FAXED ON 04/30/21--THEY DID NOT RECEIVE Current glucose readings: fasting glucose: <130, post prandial glucose: <180 Hasn't been able to check; I requested patient bring her glucometer to me and I will assist with set up and education Denies hypoglycemic/hyperglycemic symptoms Discussed meal planning options and Plate method for healthy eating Avoid sugary drinks and desserts Incorporate balanced protein, non starchy  veggies, 1 serving of carbohydrate with each meal Increase water intake Increase physical activity as able Current exercise: N/A--DIFFICULTY WALKING Educated on MEDICATIONS (PURPOSE AND SIDE EFFECTS); needs to check blood sugar Recommended BASAGLAR AND FARXIGA (AVOIDING GLP1 GIVEN GI HISTORY); may consider DPP4 if needed (would like to reduce insulin) Assessed patient finances. oPATIENT ASSISTANCE COMPLETED FOR AZ&ME (FARXIGA) AND LILLY CARES (BASAGLAR)  Hyperlipidemia:  Goal on Track (progressing): YES. Uncontrolled-LDL 74, almost at goal; current treatment:ATORVASTATIN; lipids due in august 2023 Medications previously tried: N/A  Current dietary patterns: RECOMMENDED HEART HEALTHY/HEALTHY PLATE METHOD Lipid Panel     Component Value Date/Time   CHOL 124 08/27/2017 1314   TRIG 126 08/27/2017 1314   TRIG 108 05/09/2014 1403   HDL 53 08/27/2017 1314   HDL 51 05/09/2014 1403   CHOLHDL 2.3 08/27/2017 1314   LDLCALC 46 08/27/2017 1314   LDLDIRECT 74 10/16/2020 1509   LABVLDL 25 08/27/2017 1314  Patient Goals/Self-Care Activities patient will:  - take medications as prescribed as evidenced by patient report and record review check glucose DAILY FASTING OR IF SYMPTOMATIC, document, and provide at future appointments collaborate with provider on medication access solutions target a minimum of 150 minutes of moderate intensity exercise weekly engage in dietary modifications by FOLLOWING HEALTHY PLATE METHOD HANDOUT/heart healthy diet  Depression Screen    08/13/2021    3:50 PM 05/09/2021    9:50 AM 03/18/2021    1:22 PM 10/16/2020    3:07 PM 07/22/2020    4:49 PM 01/16/2020    3:38 PM 09/15/2019    3:49 PM  PHQ 2/9 Scores  PHQ - 2 Score 0 0 0 0 0 0 0  PHQ- 9 Score  2    0     Fall Risk    08/13/2021    3:35 PM 05/09/2021    9:50 AM 03/18/2021    1:22 PM 10/16/2020    3:06 PM 07/22/2020    4:47 PM  Fall Risk   Falls in the past year? 1 1 0 1 1  Number falls in past yr: 0 0   0 0  Injury with Fall? '1 1  1 1  ' Risk for fall due to : History of fall(s);Impaired balance/gait;Orthopedic patient History of fall(s)  History of fall(s) History of fall(s);Impaired vision  Follow up Education provided;Falls prevention discussed Falls evaluation completed  Falls evaluation completed Falls prevention discussed;Education provided    FALL RISK PREVENTION PERTAINING TO THE HOME:  Any stairs in or around the home? Yes  If so, are there any without handrails? No  Home free of loose throw rugs in walkways, pet beds, electrical cords, etc? Yes  Adequate lighting in your home to reduce risk of falls? Yes   ASSISTIVE DEVICES UTILIZED TO PREVENT FALLS:  Life alert? No  Use of a cane, walker or w/c? Yes  Grab bars in the bathroom? Yes  Shower chair or bench in shower? No  Elevated toilet seat or a handicapped toilet? Yes   TIMED UP AND GO:  Was the test performed? No . Telephonic visit  Cognitive Function:    07/29/2017   12:06 PM 07/27/2016    9:08 AM  MMSE - Mini Mental State Exam  Orientation to time 5 5  Orientation to Place 5 5  Registration 3 3  Attention/ Calculation 5 5  Recall 3 3  Language- name 2 objects 2 2  Language- repeat 1 1  Language- follow 3 step command 3 3  Language- read & follow direction 1 1  Write a sentence 1 1  Copy design 1 1  Total score 30 30        08/13/2021    3:44 PM 11/17/2018    2:01 PM  6CIT Screen  What Year? 0 points 0 points  What month? 0 points 0 points  What time? 0 points 0 points  Count back from 20 0 points 0 points  Months in reverse 0 points 0 points  Repeat phrase 2 points 0 points  Total Score 2 points 0 points    Immunizations Immunization History  Administered Date(s) Administered   Fluad Quad(high Dose 65+) 11/15/2018, 11/22/2019   Influenza, High Dose Seasonal PF 11/29/2015, 11/18/2016, 12/06/2017   Influenza,inj,Quad PF,6+ Mos 12/07/2013, 11/12/2014   Influenza-Unspecified 11/20/2020   Moderna  Covid Bivalent Peds Booster(5moThru 568yr 07/08/2021   PFIZER(Purple Top)SARS-COV-2 Vaccination 04/16/2019, 05/16/2019, 11/13/2019, 05/23/2020, 11/20/2020   Pneumococcal Conjugate-13 03/17/2013   Pneumococcal Polysaccharide-23 07/29/2017, 02/09/2018   Zoster Recombinat (Shingrix) 03/18/2021    TDAP status: Due, Education has been provided regarding the importance of this vaccine. Advised may receive this vaccine at local pharmacy or Health Dept. Aware to provide a copy of the vaccination record if obtained from local pharmacy or Health Dept. Verbalized acceptance and understanding.  Flu Vaccine status:  Up to date  Pneumococcal vaccine status: Up to date  Covid-19 vaccine status: Completed vaccines  Qualifies for Shingles Vaccine? Yes   Zostavax completed Yes   Shingrix Completed?: No.    Education has been provided regarding the importance of this vaccine. Patient has been advised to call insurance company to determine out of pocket expense if they have not yet received this vaccine. Advised may also receive vaccine at local pharmacy or Health Dept. Verbalized acceptance and understanding.  Screening Tests Health Maintenance  Topic Date Due   FOOT EXAM  01/15/2021   URINE MICROALBUMIN  01/16/2021   DEXA SCAN  04/02/2021   OPHTHALMOLOGY EXAM  04/16/2021   Zoster Vaccines- Shingrix (2 of 2) 05/13/2021   TETANUS/TDAP  10/16/2021 (Originally 04/03/1964)   HEMOGLOBIN A1C  09/15/2021   INFLUENZA VACCINE  09/16/2021   MAMMOGRAM  11/14/2022   Fecal DNA (Cologuard)  02/06/2023   Pneumonia Vaccine 32+ Years old  Completed   COVID-19 Vaccine  Completed   Hepatitis C Screening  Completed   HPV VACCINES  Aged Out    Health Maintenance  Health Maintenance Due  Topic Date Due   FOOT EXAM  01/15/2021   URINE MICROALBUMIN  01/16/2021   DEXA SCAN  04/02/2021   OPHTHALMOLOGY EXAM  04/16/2021   Zoster Vaccines- Shingrix (2 of 2) 05/13/2021    Colorectal cancer screening: Type of  screening: Cologuard. Completed 02/06/2020. Repeat every 33 years  Mammogram status: Completed 11/13/2020. Repeat every year  Bone Density status: Completed 04/03/2019. Results reflect: Bone density results: NORMAL. Repeat every 2 years.  Lung Cancer Screening: (Low Dose CT Chest recommended if Age 27-80 years, 30 pack-year currently smoking OR have quit w/in 15years.) does not qualify.   Additional Screening:  Hepatitis C Screening: does qualify; Completed 12/06/2017  Vision Screening: Recommended annual ophthalmology exams for early detection of glaucoma and other disorders of the eye. Is the patient up to date with their annual eye exam?  Yes  Who is the provider or what is the name of the office in which the patient attends annual eye exams? [facility] If pt is not established with a provider, would they like to be referred to a provider to establish care? No .   Dental Screening: Recommended annual dental exams for proper oral hygiene  Community Resource Referral / Chronic Care Management: CRR required this visit?  No   CCM required this visit?  No      Plan:     I have personally reviewed and noted the following in the patient's chart:   Medical and social history Use of alcohol, tobacco or illicit drugs  Current medications and supplements including opioid prescriptions.  Functional ability and status Nutritional status Physical activity Advanced directives List of other physicians Hospitalizations, surgeries, and ER visits in previous 12 months Vitals Screenings to include cognitive, depression, and falls Referrals and appointments  In addition, I have reviewed and discussed with patient certain preventive protocols, quality metrics, and best practice recommendations. A written personalized care plan for preventive services as well as general preventive health recommendations were provided to patient.     Sandrea Hammond, LPN   [id number]   Nurse Notes: still has a  sore/ crack on bottom on right heel, very tender to touch, no redness, warmth, or oozing - she declined appt saying she is waiting to see Dr Irving Shows in late July. Advised to make sick appt if it seems worse before then - continue to monitor and apply  thcik layer of vaseline covered with sock frequently to help heal.

## 2021-09-15 ENCOUNTER — Encounter: Payer: Self-pay | Admitting: Family Medicine

## 2021-09-15 ENCOUNTER — Ambulatory Visit (INDEPENDENT_AMBULATORY_CARE_PROVIDER_SITE_OTHER): Payer: Medicare PPO | Admitting: Family Medicine

## 2021-09-15 VITALS — BP 122/77 | HR 79 | Temp 97.8°F | Ht 68.0 in | Wt 169.8 lb

## 2021-09-15 DIAGNOSIS — Z794 Long term (current) use of insulin: Secondary | ICD-10-CM

## 2021-09-15 DIAGNOSIS — E785 Hyperlipidemia, unspecified: Secondary | ICD-10-CM | POA: Diagnosis not present

## 2021-09-15 DIAGNOSIS — Z23 Encounter for immunization: Secondary | ICD-10-CM

## 2021-09-15 DIAGNOSIS — Z720 Tobacco use: Secondary | ICD-10-CM

## 2021-09-15 DIAGNOSIS — E1169 Type 2 diabetes mellitus with other specified complication: Secondary | ICD-10-CM | POA: Diagnosis not present

## 2021-09-15 DIAGNOSIS — E119 Type 2 diabetes mellitus without complications: Secondary | ICD-10-CM | POA: Diagnosis not present

## 2021-09-15 LAB — BAYER DCA HB A1C WAIVED: HB A1C (BAYER DCA - WAIVED): 7.7 % — ABNORMAL HIGH (ref 4.8–5.6)

## 2021-09-15 NOTE — Progress Notes (Signed)
Subjective: CC:DM PCP: [name], DO OLM:BEMLJ C Debra Miranda is a 76 y.o. female presenting to clinic today for:  1. Type 2 Diabetes with hyperlipidemia:  Patient reports compliance with all medications.  She is currently injecting 16 units of the Basaglar daily and taking her Farxiga 10 mg daily as directed.  Compliant with Lipitor.  She continues to smoke.  No hypoglycemic episodes but she admits she does not check her blood sugar.  Be very interested in the [place] as her barrier to checking blood sugar is pain with fingersticks.  Last eye exam: Up-to-date Last foot exam: Needs Last A1c:  Lab Results  Component Value Date   HGBA1C 7.7 (H) 09/15/2021   Nephropathy screen indicated?:  Needs Last flu, zoster and/or pneumovax:  Immunization History  Administered Date(s) Administered   Fluad Quad(high Dose 65+) 11/15/2018, 11/22/2019   Influenza, High Dose Seasonal PF 11/29/2015, 11/18/2016, 12/06/2017   Influenza,inj,Quad PF,6+ Mos 12/07/2013, 11/12/2014   Influenza-Unspecified 11/20/2020   Moderna Covid Bivalent Peds Booster(51moThru 555yr 07/08/2021   PFIZER(Purple Top)SARS-COV-2 Vaccination 04/16/2019, 05/16/2019, 11/13/2019, 05/23/2020, 11/20/2020   Pneumococcal Conjugate-13 03/17/2013   Pneumococcal Polysaccharide-23 07/29/2017, 02/09/2018   Zoster Recombinat (Shingrix) 03/18/2021, 09/15/2021    ROS: No chest pain, shortness of breath, visual disturbance reported.  Denies any vaginitis or dysuria    ROS: Per HPI  Allergies  Allergen Reactions   Augmentin [Amoxicillin-Pot Clavulanate] Nausea And Vomiting   Latex Itching   Past Medical History:  Diagnosis Date   Arthritis    right hand   Cataract    RIGHT EYE; now removed   Depression    many years ago, no meds   Diabetes mellitus without complication ([facility]   type 2   Endometrial adenocarcinoma ([facility]/20/2019   treated with HDR   Environmental allergies    Family history of breast cancer    Family  history of kidney cancer    Family history of pancreatic cancer    Family history of stomach cancer    Family history of uterine cancer    Hemorrhoids    History of radiation therapy 04/05/18,02/25,3/3,3/10,3/18   04/05/2018, 04/12/2018, 04/19/2018, 04/26/2018, 05/04/2018: Vagina, Iridium HDR, 3 cm cylinder, tx length of 3 cm / 30 Gy in 5 fractions.. Dr. JaGery Pray Nodular basal cell carcinoma (BCC) 08/2017   LEFT LOWER LEG above foot   Orthostatic hypotension    Patient is unaware of this diagnosis    Current Outpatient Medications:    atorvastatin (LIPITOR) 20 MG tablet, TAKE 1 TABLET BY MOUTH EVERY DAY, Disp: 90 tablet, Rfl: 0   dapagliflozin propanediol (FARXIGA) 10 MG TABS tablet, Take 1 tablet (10 mg total) by mouth daily., Disp: 90 tablet, Rfl: 4   escitalopram (LEXAPRO) 10 MG tablet, TAKE 1 TABLET BY MOUTH EVERY DAY, Disp: 90 tablet, Rfl: 1   Insulin Glargine (BASAGLAR KWIKPEN) 100 UNIT/ML, Inject 20 Units into the skin at bedtime. Sample #1pen LOT [id number], EXP 8/21 (Patient taking differently: Inject 16 Units into the skin at bedtime. Sample #1pen LOT [id number], EXP 8/21), Disp: 15 mL, Rfl: 3   Lactobacillus-Inulin (CUGreenupCAPS, Take 1 capsule daily for digestive health, Disp: 90 capsule, Rfl: 3   BD PEN NEEDLE NANO 2ND GEN 32G X 4 MM MISC, USE TO INJECT INSULIN DAILY AS DIRECTED. DX: E11.9 (Patient not taking: Reported on 09/15/2021), Disp: 100 each, Rfl: 5 Social History   Socioeconomic History   Marital status: Widowed    Spouse name:  Not on file   Number of children: 1   Years of education: 14   Highest education level: Associate degree: occupational, Hotel manager, or vocational program  Occupational History   Occupation: Retired    Comment: [employer]  Tobacco Use   Smoking status: Every Day    Types: Cigars   Smokeless tobacco: Never   Tobacco comments:    4 small cigars a day  Vaping Use   Vaping Use: Former   Start date: 02/16/2010    Quit date: 02/17/2011   Devices: unsure  Substance and Sexual Activity   Alcohol use: Not Currently   Drug use: No   Sexual activity: Not Currently  Other Topics Concern   Not on file  Social History Narrative   Her daughter lives with her and helps with bills and ride if she needs   Patient is pretty independent, but sometimes has excessive pain and needs assistance   Social Determinants of Health   Financial Resource Strain: Low Risk  (08/13/2021)   Overall Financial Resource Strain (CARDIA)    Difficulty of Paying Living Expenses: Not hard at all  Food Insecurity: No Food Insecurity (08/13/2021)   Hunger Vital Sign    Worried About Running Out of Food in the Last Year: Never true    [place] in the Last Year: Never true  Transportation Needs: No Transportation Needs (08/13/2021)   PRAPARE - Hydrologist (Medical): No    Lack of Transportation (Non-Medical): No  Physical Activity: Inactive (08/13/2021)   Exercise Vital Sign    Days of Exercise per Week: 0 days    Minutes of Exercise per Session: 0 min  Stress: No Stress Concern Present (08/13/2021)   [place]    Feeling of Stress : Not at all  Social Connections: Moderately Integrated (08/13/2021)   Social Connection and Isolation Panel [NHANES]    Frequency of Communication with Friends and Family: More than three times a week    Frequency of Social Gatherings with Friends and Family: More than three times a week    Attends Religious Services: More than 4 times per year    Active Member of [employer] or Organizations: Yes    Attends Archivist Meetings: More than 4 times per year    Marital Status: Widowed  Intimate Partner Violence: Not At Risk (08/13/2021)   Humiliation, Afraid, Rape, and Kick questionnaire    Fear of Current or Ex-Partner: No    Emotionally Abused: No    Physically Abused: No    Sexually Abused: No    Family History  Problem Relation Age of Onset   Cervical cancer Mother 32       treated with radiation   Pancreatic cancer Mother 19   Stroke Father    Pulmonary fibrosis Brother    Alzheimer's disease Brother    Stomach cancer Brother        dx 55s   Diabetes Brother    Lung cancer Maternal Grandmother    Stroke Paternal Grandfather    Uterine cancer Maternal Aunt        dx 104s   Kidney cancer Maternal Uncle        dx 93s   Colon cancer Maternal Uncle        dx 4s   Breast cancer Cousin        maternal cousin dx 71    Objective: Office vital signs  reviewed. BP 122/77   Pulse 79   Temp 97.8 F (36.6 C)   Ht '5\' 8"'$  (1.727 m)   Wt 169 lb 12.8 oz (77 kg)   SpO2 93%   BMI 25.82 kg/m   Physical Examination:  General: Awake, alert, nontoxic elderly, chronically ill-appearing female, No acute distress HEENT: Sclera white Cardio: regular rate and rhythm, S1S2 heard, no murmurs appreciated Pulm: Mild expiratory wheezes appreciated throughout.  Normal work of breathing on room air.  Assessment/ Plan: 76 y.o. female   Type 2 diabetes mellitus with other specified complication, with long-term current use of insulin ([place]) - Plan: Bayer DCA Hb A1c Waived  Hyperlipidemia associated with type 2 diabetes mellitus ([place])  Tobacco use  Sugar is technically at goal for advanced age and comorbidities.  Would like her to be a little closer to 7.0.  We will see if we get her set up for freestyle libre as I do think this would give her improved sugar without significant risk of hypoglycemia/need for advancing meds.  Check urine microalbumin.  ROI completed for eye exam  Continue statin.  Precontemplative state of tobacco use  Orders Placed This Encounter  Procedures   Bayer DCA Hb A1c Waived   No orders of the defined types were placed in this encounter.    [name], DO [place] [phone]

## 2021-09-19 ENCOUNTER — Other Ambulatory Visit: Payer: Self-pay | Admitting: Family Medicine

## 2021-09-19 DIAGNOSIS — E1169 Type 2 diabetes mellitus with other specified complication: Secondary | ICD-10-CM

## 2021-09-19 DIAGNOSIS — Z794 Long term (current) use of insulin: Secondary | ICD-10-CM

## 2021-09-19 NOTE — Progress Notes (Signed)
done

## 2021-10-01 ENCOUNTER — Telehealth: Payer: Self-pay

## 2021-10-01 NOTE — Chronic Care Management (AMB) (Signed)
  Chronic Care Management   Outreach Note  10/01/2021 Name: Debra Miranda MRN: [id number] DOB: 1945-12-30  Debra Miranda is a 76 y.o. year old female who is a primary care patient of [name], DO. I reached out to [employer] by phone today in response to a referral sent by [name]'s primary care provider.  An unsuccessful telephone outreach was attempted today. The patient was referred to the case management team for assistance with care management and care coordination.   Follow Up Plan: A HIPAA compliant phone message was left for the patient providing contact information and requesting a return call.  The care management team will reach out to the patient again over the next 7 days.  If patient returns call to provider office, please advise to call [place] * at [phone]*  Noreene Larsson, [place], [place] [address] Direct Dial: [phone] [email or web address]

## 2021-10-03 DIAGNOSIS — N319 Neuromuscular dysfunction of bladder, unspecified: Secondary | ICD-10-CM | POA: Diagnosis not present

## 2021-10-03 DIAGNOSIS — R3914 Feeling of incomplete bladder emptying: Secondary | ICD-10-CM | POA: Diagnosis not present

## 2021-10-03 NOTE — Chronic Care Management (AMB) (Unsigned)
  Chronic Care Management Note  10/03/2021 Name: Debra Miranda MRN: [id number] DOB: Apr 07, 1945  Debra Miranda is a 76 y.o. year old female who is a primary care patient of [name], DO and is actively engaged with the care management team. I reached out to Michel Bickers by phone today to assist with re-scheduling a follow up visit with the Pharmacist  Follow up plan: Unsuccessful telephone outreach attempt made. A HIPAA compliant phone message was left for the patient providing contact information and requesting a return call.  The care management team will reach out to the patient again over the next 7 days.  If patient returns call to provider office, please advise to call [facility]  at [place], [place], [place] [address] Direct Dial: [phone] [email or web address]

## 2021-10-07 NOTE — Chronic Care Management (AMB) (Signed)
  Chronic Care Management Note  10/07/2021 Name: Debra Miranda MRN: [id number] DOB: 11/26/1945  Debra Miranda is a 77 y.o. year old female who is a primary care patient of [name], DO and is actively engaged with the care management team. I reached out to Michel Bickers by phone today to assist with re-scheduling a follow up visit with the Pharmacist  Follow up plan: Unable to make contact on outreach attempts x 3. PCP [name], DO notified via routed documentation in medical record.   Noreene Larsson, [place], [place] [address] Direct Dial: [phone] [email or web address]

## 2021-10-17 ENCOUNTER — Other Ambulatory Visit: Payer: Self-pay | Admitting: Family Medicine

## 2021-10-17 DIAGNOSIS — E1169 Type 2 diabetes mellitus with other specified complication: Secondary | ICD-10-CM

## 2021-10-21 ENCOUNTER — Encounter: Payer: Self-pay | Admitting: Gynecologic Oncology

## 2021-10-23 ENCOUNTER — Inpatient Hospital Stay: Payer: Medicare PPO | Attending: Gynecologic Oncology | Admitting: Gynecologic Oncology

## 2021-10-23 ENCOUNTER — Other Ambulatory Visit: Payer: Self-pay

## 2021-10-23 ENCOUNTER — Encounter: Payer: Self-pay | Admitting: Gynecologic Oncology

## 2021-10-23 VITALS — BP 134/73 | HR 84 | Temp 98.7°F | Resp 16 | Ht 65.75 in | Wt 167.6 lb

## 2021-10-23 DIAGNOSIS — Z90710 Acquired absence of both cervix and uterus: Secondary | ICD-10-CM | POA: Diagnosis not present

## 2021-10-23 DIAGNOSIS — Z923 Personal history of irradiation: Secondary | ICD-10-CM | POA: Insufficient documentation

## 2021-10-23 DIAGNOSIS — Z90722 Acquired absence of ovaries, bilateral: Secondary | ICD-10-CM | POA: Insufficient documentation

## 2021-10-23 DIAGNOSIS — Z8542 Personal history of malignant neoplasm of other parts of uterus: Secondary | ICD-10-CM

## 2021-10-23 DIAGNOSIS — C541 Malignant neoplasm of endometrium: Secondary | ICD-10-CM

## 2021-10-23 NOTE — Progress Notes (Signed)
Gynecologic Oncology Return Clinic Visit  10/23/2021  Reason for Visit: Surveillance visit in the setting of endometrial cancer  Treatment History: Oncology History  Endometrial adenocarcinoma ([place])  01/05/2018 Surgery   [place] D&C with findings showing a single polyp and grossly normal endometrium. The polyp returned with Grade 2 Endometrioid Adenocarcinoma "partially involving a polyp".   01/05/2018 Initial Diagnosis   Endometrial adenocarcinoma ([place])   02/03/2018 Genetic Testing   Patient has genetic testing with [facility]. Results revealed patient has the following mutation(s): none   02/08/2018 Surgery   Robotic assisted TLH/BSO with sentinel nodes and washings. Frozen section returned with no MI   02/08/2018 Pathologic Stage   IA grade 2, endometrioid, no MI, negative LNs, no LVSI   04/05/2018 - 05/04/2018 Radiation Therapy   Vaginal brachytherapy, Iridium HDR, 3 cm cylinder, tx length of 3 cm / 30 Gy in 5 fractions.     Interval History: Doing well.  Denies any vaginal bleeding or discharge.  Continues to have intermittent diarrhea and mild constipation, which has been her baseline since intestinal surgery.  Continues to have to catheterize, denies urinary symptoms.  Denies any abdominal or pelvic pain.  Recently had cataract surgery for her left eye.  Past Medical/Surgical History: Past Medical History:  Diagnosis Date   Arthritis    right hand   Cataract    bilateral   Depression    many years ago, no meds   Diabetes mellitus without complication ([place])    type 2   Endometrial adenocarcinoma ([place]) 01/05/2018   treated with HDR   Environmental allergies    Family history of breast cancer    Family history of kidney cancer    Family history of pancreatic cancer    Family history of stomach cancer    Family history of uterine cancer    Hemorrhoids    History of radiation therapy 04/05/18,02/25,3/3,3/10,3/18   04/05/2018, 04/12/2018, 04/19/2018, 04/26/2018,  05/04/2018: Vagina, Iridium HDR, 3 cm cylinder, tx length of 3 cm / 30 Gy in 5 fractions.. Dr. Gery Pray   Nodular basal cell carcinoma ([place]) 08/2017   LEFT LOWER LEG above foot   Orthostatic hypotension    Patient is unaware of this diagnosis    Past Surgical History:  Procedure Laterality Date   ABDOMINAL HYSTERECTOMY     Dr. Gerarda Fraction 02-08-18    BOWEL RESECTION N/A 06/01/2019   Procedure: SMALL BOWEL RESECTION;  Surgeon: Virl Cagey, MD;  Location: [facility];  Service: General;  Laterality: N/A;   CATARACT EXTRACTION     RIGHT EYE   HYSTEROSCOPY WITH D & C N/A 01/05/2018   Procedure: DILATATION AND CURETTAGE/HYSTEROSCOPY;  Surgeon: Florian Buff, MD;  Location: [facility];  Service: Gynecology;  Laterality: N/A;   LUMBAR DISC SURGERY  2006   OMENTECTOMY N/A 06/01/2019   Procedure: OMENTECTOMY;  Surgeon: Virl Cagey, MD;  Location: [facility];  Service: General;  Laterality: N/A;   POLYPECTOMY N/A 01/05/2018   Procedure: ENDOMETRIAL POLYPECTOMY;  Surgeon: Florian Buff, MD;  Location: [facility];  Service: Gynecology;  Laterality: N/A;   ROBOTIC ASSISTED TOTAL HYSTERECTOMY WITH BILATERAL SALPINGO OOPHERECTOMY Bilateral 02/08/2018   Procedure: XI ROBOTIC ASSISTED TOTAL HYSTERECTOMY WITH BILATERAL SALPINGO OOPHORECTOMY;  Surgeon: Isabel Caprice, MD;  Location: [facility];  Service: Gynecology;  Laterality: Bilateral;   SENTINEL NODE BIOPSY Bilateral 02/08/2018   Procedure: SENTINEL NODE BIOPSY AND PELVIC LYMPH NODE SAMPLING;  Surgeon: Isabel Caprice, MD;  Location: [facility];  Service: Gynecology;  Laterality: Bilateral;   TONSILLECTOMY     as a child   UMBILICAL HERNIA REPAIR N/A 06/01/2019   Procedure: HERNIA REPAIR UMBILICAL ADULT;  Surgeon: Virl Cagey, MD;  Location: [facility];  Service: General;  Laterality: N/A;    Family History  Problem Relation Age of Onset   Cervical cancer Mother 35       treated with radiation   Pancreatic cancer Mother 54   Stroke Father     Pulmonary fibrosis Brother    Alzheimer's disease Brother    Stomach cancer Brother        dx 65s   Diabetes Brother    Lung cancer Maternal Grandmother    Stroke Paternal Grandfather    Uterine cancer Maternal Aunt        dx 76s   Kidney cancer Maternal Uncle        dx 71s   Colon cancer Maternal Uncle        dx 46s   Breast cancer Cousin        maternal cousin dx 55    Social History   Socioeconomic History   Marital status: Widowed    Spouse name: Not on file   Number of children: 1   Years of education: 14   Highest education level: Associate degree: occupational, Hotel manager, or vocational program  Occupational History   Occupation: Retired    Comment: [employer]  Tobacco Use   Smoking status: Every Day    Types: Cigars   Smokeless tobacco: Never   Tobacco comments:    4 small cigars a day  Vaping Use   Vaping Use: Former   Start date: 02/16/2010   Quit date: 02/17/2011   Devices: unsure  Substance and Sexual Activity   Alcohol use: Not Currently   Drug use: No   Sexual activity: Not Currently  Other Topics Concern   Not on file  Social History Narrative   Her daughter lives with her and helps with bills and ride if she needs   Patient is pretty independent, but sometimes has excessive pain and needs assistance   Social Determinants of Health   Financial Resource Strain: Low Risk  (08/13/2021)   Overall Financial Resource Strain (CARDIA)    Difficulty of Paying Living Expenses: Not hard at all  Food Insecurity: No Food Insecurity (08/13/2021)   Hunger Vital Sign    Worried About Running Out of Food in the Last Year: Never true    [place] in the Last Year: Never true  Transportation Needs: No Transportation Needs (08/13/2021)   PRAPARE - Hydrologist (Medical): No    Lack of Transportation (Non-Medical): No  Physical Activity: Inactive (08/13/2021)   Exercise Vital Sign    Days of Exercise per Week: 0 days    Minutes  of Exercise per Session: 0 min  Stress: No Stress Concern Present (08/13/2021)   [place]    Feeling of Stress : Not at all  Social Connections: Moderately Integrated (08/13/2021)   Social Connection and Isolation Panel [NHANES]    Frequency of Communication with Friends and Family: More than three times a week    Frequency of Social Gatherings with Friends and Family: More than three times a week    Attends Religious Services: More than 4 times per year    Active Member of [employer] or Organizations: Yes    Attends Archivist Meetings:  More than 4 times per year    Marital Status: Widowed    Current Medications:  Current Outpatient Medications:    atorvastatin (LIPITOR) 20 MG tablet, TAKE 1 TABLET BY MOUTH EVERY DAY, Disp: 90 tablet, Rfl: 0   dapagliflozin propanediol (FARXIGA) 10 MG TABS tablet, Take 1 tablet (10 mg total) by mouth daily., Disp: 90 tablet, Rfl: 4   escitalopram (LEXAPRO) 10 MG tablet, TAKE 1 TABLET BY MOUTH EVERY DAY, Disp: 90 tablet, Rfl: 0   Insulin Glargine (BASAGLAR KWIKPEN) 100 UNIT/ML, Inject 20 Units into the skin at bedtime. Sample #1pen LOT [id number], EXP 8/21 (Patient taking differently: Inject 16 Units into the skin at bedtime. Sample #1pen LOT [id number], EXP 8/21), Disp: 15 mL, Rfl: 3   Lactobacillus-Inulin ([place]) CAPS, Take 1 capsule daily for digestive health, Disp: 90 capsule, Rfl: 3  Review of Systems: + Ringing in ears, cough, constipation, urinary frequency, joint pain, back pain, itching, problem with walking. Denies appetite changes, fevers, chills, fatigue, unexplained weight changes. Denies hearing loss, neck lumps or masses, mouth sores or voice changes. Denies cough or wheezing.  Denies shortness of breath. Denies chest pain or palpitations. Denies leg swelling. Denies abdominal distention, pain, blood in stools, nausea, vomiting, or early  satiety. Denies pain with intercourse, dysuria, hematuria or incontinence. Denies hot flashes, pelvic pain, vaginal bleeding or vaginal discharge.   Denies rash, or wounds. Denies dizziness, headaches, numbness or seizures. Denies swollen lymph nodes or glands, denies easy bruising or bleeding. Denies anxiety, depression, confusion, or decreased concentration.  Physical Exam: BP 134/73 (BP Location: Left Arm, Patient Position: Sitting)   Pulse 84   Temp 98.7 F (37.1 C) (Oral)   Resp 16   Ht 5' 5.75" (1.67 m)   Wt 167 lb 9.6 oz (76 kg)   SpO2 96%   BMI 27.26 kg/m  General: Alert, oriented, no acute distress. HEENT: Normocephalic, atraumatic, sclera anicteric. Chest: Bilateral inspiratory wheezing, otherwise lungs clear to auscultation bilaterally. Cardiovascular: Regular rate and rhythm, no murmurs. Abdomen: soft, nontender.  Normoactive bowel sounds.  No masses or hepatosplenomegaly appreciated.  Well-healed incisions. Extremities: Grossly normal range of motion.  Warm, well perfused.  No edema bilaterally. Skin: No rashes or lesions noted. Lymphatics: No cervical, supraclavicular, or inguinal adenopathy. GU: Normal appearing external genitalia without erythema, excoriation, or lesions.  Speculum exam reveals mildly atrophic vaginal mucosa, radiation changes noted at the cuff.  No lesions or atypical vascularity.  Bimanual exam reveals cuff intact, no nodularity or masses, mild adhesions noted at the apex.  Rectovaginal exam confirms these findings.  Laboratory & Radiologic Studies: None new  Assessment & Plan: CHARYL MINERVINI is a 76 y.o. woman with stage Ia grade 2 endometrial adenocarcinoma. Completed adj VBT 04/2018.   The patient is NED on exam today.  Overall doing well.  Per SGO and NCCN surveillance recommendations, we will continue with visits every 6 months until 5 years out from completion of adjuvant treatment, which will be in March 2025.  Patient sees Dr. Sondra Come in  March.  She will call over the summer to schedule a visit to see me next September.  Reviewed signs and symptoms of recurrence that should prompt a phone call sooner than her next appointment.  20 minutes of total time was spent for this patient encounter, including preparation, face-to-face counseling with the patient and coordination of care, and documentation of the encounter.  Jeral Pinch, MD  Division of Gynecologic Oncology  Department of Obstetrics and Gynecology  [facility]

## 2021-10-23 NOTE — Patient Instructions (Addendum)
It was good to see you today.  I do not see or feel any evidence of cancer recurrence on your exam.  You see Dr. Sondra Come in March 2024 follow-up.  Please call my office sometime over the summer to schedule a visit to see me in September of next year.  We will continue with visits every 6 months until 5 years from finishing treatment.  As always, please call if you develop any new and concerning symptoms.

## 2021-10-24 ENCOUNTER — Ambulatory Visit (INDEPENDENT_AMBULATORY_CARE_PROVIDER_SITE_OTHER): Payer: Medicare PPO | Admitting: Pharmacist

## 2021-10-24 DIAGNOSIS — E119 Type 2 diabetes mellitus without complications: Secondary | ICD-10-CM

## 2021-10-24 DIAGNOSIS — E1169 Type 2 diabetes mellitus with other specified complication: Secondary | ICD-10-CM

## 2021-10-24 NOTE — Progress Notes (Signed)
Chronic Care Management Pharmacy Note  10/24/2021 Name:  Debra Miranda MRN:  [id number] DOB:  Jul 02, 1945  Summary:  Diabetes: Goal on Track (progressing): YES. Controlled- slight increase 7.1%--> 7.7%; GFR 91 CURRENT treatment:  BASAGLAR 16-20 units, FARXIGA 10MG daily (continue current regimen for now)  Previous treatments: JANUVIA WAS DISCONTINUED at last PCP visit in 2022 (may have to add back; onglyza would be easy to obtain if we can't do GLP1-GI history approx 1 yr ago; will address) 2023 PATIENT ASSISTANCE COMPLETED FOR AZ&ME (FARXIGA) AND [employer]ES (BASAGLAR) [place] Current glucose readings: fasting glucose: <130, post prandial glucose: <180 Hasn't been able to check; I requested patient bring her glucometer to me and I will assist with set up and education Attempting to get CGM --> Rx sent for Libre 3 CGM system via [employer] (via parachute portal) Denies hypoglycemic/hyperglycemic symptoms Discussed meal planning options and Plate method for healthy eating Avoid sugary drinks and desserts Incorporate balanced protein, non starchy veggies, 1 serving of carbohydrate with each meal Increase water intake Increase physical activity as able Current exercise: N/A--DIFFICULTY WALKING Educated on MEDICATIONS (PURPOSE AND SIDE EFFECTS); needs to check blood sugar Recommended BASAGLAR AND FARXIGA (AVOIDING GLP1 GIVEN GI HISTORY); may consider DPP4 if needed (would like to reduce insulin) Assessed patient finances. PATIENT ASSISTANCE active through 02/15/22 FOR AZ&ME (FARXIGA) AND [employer] (BASAGLAR)  Hyperlipidemia:  Goal on Track (progressing): YES. Uncontrolled-LDL 74, almost at goal; current treatment:ATORVASTATIN; lipids due in august 2023 Medications previously tried: N/A  Current dietary patterns: RECOMMENDED HEART HEALTHY/HEALTHY PLATE METHOD Lipid Panel     Component Value Date/Time   CHOL 124 08/27/2017 1314   TRIG 126  08/27/2017 1314   TRIG 108 05/09/2014 1403   HDL 53 08/27/2017 1314   HDL 51 05/09/2014 1403   CHOLHDL 2.3 08/27/2017 1314   LDLCALC 46 08/27/2017 1314   LDLDIRECT 74 10/16/2020 1509   LABVLDL 25 08/27/2017 1314    Subjective: Debra Miranda is an 76 y.o. year old female who is a primary patient of [name], DO.  The CCM team was consulted for assistance with disease management and care coordination needs.    Engaged with patient by telephone for follow up visit in response to provider referral for pharmacy case management and/or care coordination services.   Consent to Services:  The patient was given information about Chronic Care Management services, agreed to services, and gave verbal consent prior to initiation of services.  Please see initial visit note for detailed documentation.   Patient Care Team: [name], DO as PCP - General (Family Medicine) Lavera Guise, [facility] (Pharmacist) Robley Fries, MD as Consulting Physician (Urology) Jola Schmidt, MD as Consulting Physician (Ophthalmology)  Lab Results  Component Value Date   CREATININE 0.70 10/16/2020   CREATININE 0.61 01/16/2020   CREATININE 0.35 (L) 06/05/2019    Lab Results  Component Value Date   HGBA1C 7.7 (H) 09/15/2021   Last diabetic Eye exam:  Lab Results  Component Value Date/Time   HMDIABEYEEXA No Retinopathy 04/17/2021 12:00 AM    Last diabetic Foot exam: No results found for: "HMDIABFOOTEX"      Component Value Date/Time   CHOL 124 08/27/2017 1314   TRIG 126 08/27/2017 1314   TRIG 108 05/09/2014 1403   HDL 53 08/27/2017 1314   HDL 51 05/09/2014 1403   CHOLHDL 2.3 08/27/2017 1314   LDLCALC 46 08/27/2017 1314   LDLDIRECT  74 10/16/2020 1509       Latest Ref Rng & Units 10/16/2020    3:09 PM 01/16/2020    4:22 PM 06/01/2019    8:47 AM  Hepatic Function  Total Protein 6.0 - 8.5 g/dL 6.8  7.0  7.2   Albumin 3.7 - 4.7 g/dL 4.3  4.4  4.1   AST 0 - 40 IU/L '21  15  15    ' ALT 0 - 32 IU/L '21  17  18   ' Alk Phosphatase 44 - 121 IU/L 132  124  108   Total Bilirubin 0.0 - 1.2 mg/dL 0.4  0.3  1.3     Lab Results  Component Value Date/Time   TSH 1.650 05/09/2014 02:03 PM       Latest Ref Rng & Units 10/16/2020    3:09 PM 06/03/2019    6:54 AM 06/02/2019    5:42 AM  CBC  WBC 3.4 - 10.8 x10E3/uL 7.6  6.6  7.8   Hemoglobin 11.1 - 15.9 g/dL 16.7  13.1  14.8   Hematocrit 34.0 - 46.6 % 47.9  40.5  45.5   Platelets 150 - 450 x10E3/uL 159  95  107     Lab Results  Component Value Date/Time   VD25OH 31.2 08/27/2017 01:14 PM   VD25OH 35.8 11/16/2016 10:10 AM   Social History   Tobacco Use  Smoking Status Every Day   Types: Cigars  Smokeless Tobacco Never  Tobacco Comments   4 small cigars a day   Assessment: Review of patient past medical history, allergies, medications, health status, including review of consultants reports, laboratory and other test data, was performed as part of comprehensive evaluation and provision of chronic care management services.   SDOH:  (Social Determinants of Health) assessments and interventions performed:  SDOH Interventions    Flowsheet Row Clinical Support from 08/13/2021 in [place] Interventions   Food Insecurity Interventions Intervention Not Indicated  Housing Interventions Intervention Not Indicated  Transportation Interventions Intervention Not Indicated  Financial Strain Interventions Intervention Not Indicated  Physical Activity Interventions Patient Refused, Other (Comments)  Stress Interventions Intervention Not Indicated  Social Connections Interventions Intervention Not Indicated       CCM Care Plan  Allergies  Allergen Reactions   Augmentin [Amoxicillin-Pot Clavulanate] Nausea And Vomiting   Latex Itching    Medications Reviewed Today     Reviewed by Lavera Guise, [facility] (Pharmacist) on 11/05/21 at 1139  Med List Status: <None>   Medication Order Taking? Sig  Documenting Provider Last Dose Status Informant  atorvastatin (LIPITOR) 20 MG tablet [id number]  TAKE 1 TABLET BY MOUTH EVERY DAY Gottschalk, Ashly M, DO  Active   dapagliflozin propanediol (FARXIGA) 10 MG TABS tablet [id number]  Take 1 tablet (10 mg total) by mouth daily. [name], DO  Active            Med Note Shara Blazing Mar 24, 2021 10:09 AM) Via AZ&me patient assistance program; escribe to [facility] for refills   escitalopram (LEXAPRO) 10 MG tablet [id number]  TAKE 1 TABLET BY MOUTH EVERY DAY Ronnie Doss M, DO  Active   Insulin Glargine (BASAGLAR KWIKPEN) 100 UNIT/ML [id number]  Inject 20 Units into the skin at bedtime. Sample #1pen LOT [id number], EXP 8/21  Patient taking differently: Inject 16 Units into the skin at bedtime. Sample #1pen LOT [id number], EXP 8/21   Ronnie Doss M, DO  Active  Med Note Parthenia Ames Jan 30, 2021  1:05 PM) VIA [employer] PATIENT ASSISTANCE PROGRAM  Lactobacillus-Inulin ([place]) CAPS [id number]  Take 1 capsule daily for digestive health [name], DO  Active            Med Note Waller, AMY E   Wed Aug 13, 2021  3:38 PM) Now using Align            Patient Active Problem List   Diagnosis Date Noted   Subacute frontal sinusitis 07/10/2020   Contact with and (suspected) exposure to covid-19 07/10/2020   Status post umbilical hernia repair, follow-up exam 06/01/2019   Incarcerated umbilical hernia    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[place])    Strangulated umbilical hernia    Genetic testing 04/21/2018   Family history of uterine cancer    Family history of pancreatic cancer    Family history of stomach cancer    Family history of kidney cancer    Family history of breast cancer    Uterine cancer ([place]) 02/08/2018   Endometrial adenocarcinoma ([place]) 01/21/2018   Smokes cigars 12/06/2017   Nodular basal cell carcinoma (BCC) 09/15/2017   Foot callus 08/30/2017    Nonhealing skin ulcer, limited to breakdown of skin ([place]) 08/30/2017   Claw toe, acquired, right 08/30/2017   Osteopenia of neck of left femur 08/17/2016   Irritable bowel syndrome 12/03/2015   Incontinence of feces with fecal urgency 12/03/2015   Controlled type 2 diabetes mellitus without complication, without long-term current use of insulin ([place]) 09/09/2015   Hyperlipidemia associated with type 2 diabetes mellitus ([place]) 09/09/2015   Depression 09/09/2015    Immunization History  Administered Date(s) Administered   Fluad Quad(high Dose 65+) 11/15/2018, 11/22/2019   Influenza, High Dose Seasonal PF 11/29/2015, 11/18/2016, 12/06/2017   Influenza,inj,Quad PF,6+ Mos 12/07/2013, 11/12/2014   Influenza-Unspecified 11/20/2020   Moderna Covid Bivalent Peds Booster(29moThru 532yr 07/08/2021   PFIZER(Purple Top)SARS-COV-2 Vaccination 04/16/2019, 05/16/2019, 11/13/2019, 05/23/2020, 11/20/2020   Pneumococcal Conjugate-13 03/17/2013   Pneumococcal Polysaccharide-23 07/29/2017, 02/09/2018   Zoster Recombinat (Shingrix) 03/18/2021, 09/15/2021    Conditions to be addressed/monitored: CAD, HLD, and DMII  Care Plan : PHARMD MEDICATION MANAGEMENT  Updates made by PrLavera GuiseRPH since 11/05/2021 12:00 AM     Problem: DISEASE PROGRESSION PREVENTION      Long-Range Goal: T2DM   Recent Progress: On track  Priority: High  Note:   Current Barriers:  Unable to independently afford treatment regimen Unable to maintain control of T2DM--NEED PRESCRIPTION ASSISTANCE Suboptimal therapeutic regimen for T2DM  Pharmacist Clinical Goal(s):  patient will verbalize ability to afford treatment regimen maintain control of T2DM, HLD as evidenced by GOAL A1C<7% AND GOAL LDL<70  adhere to plan to optimize therapeutic regimen for T2DM as evidenced by report of adherence to recommended medication management changes through collaboration with PharmD and provider.   Interventions: 1:1 collaboration with  GoJanora NorlanderDO regarding development and update of comprehensive plan of care as evidenced by provider attestation and co-signature Inter-disciplinary care team collaboration (see longitudinal plan of care) Comprehensive medication review performed; medication list updated in electronic medical record  Diabetes: Goal on Track (progressing): YES. Controlled- slight increase 7.1%--> 7.7%; GFR 91 CURRENT treatment:  BASAGLAR 16-20 units, FARXIGA 10MG daily (continue current regimen for now)  Previous treatments: JANUVIA WAS DISCONTINUED at last PCP visit in 2022 (may have to add back; onglyza would be easy to obtain if we can't do GLP1-GI  history approx 1 yr ago; will address) 2023 PATIENT ASSISTANCE COMPLETED FOR AZ&ME (FARXIGA) AND L[employer]Y CARES (BASAGLAR) BOTH WILL SHIP TO PATIENT'S HOME Current glucose readings: fasting glucose: <130, post prandial glucose: <180 Hasn't been able to check; I requested patient bring her glucometer to me and I will assist with set up and education Attempting to get CGM --> Rx sent for Libre 3 CGM system via [employer] (via parachute portal) Denies hypoglycemic/hyperglycemic symptoms Discussed meal planning options and Plate method for healthy eating Avoid sugary drinks and desserts Incorporate balanced protein, non starchy veggies, 1 serving of carbohydrate with each meal Increase water intake Increase physical activity as able Current exercise: N/A--DIFFICULTY WALKING Educated on MEDICATIONS (PURPOSE AND SIDE EFFECTS); needs to check blood sugar Recommended BASAGLAR AND FARXIGA (AVOIDING GLP1 GIVEN GI HISTORY); may consider DPP4 if needed (would like to reduce insulin) Assessed patient finances. PATIENT ASSISTANCE active through 02/15/22 FOR AZ&ME (FARXIGA) AND L[employer] CARES (BASAGLAR)  Hyperlipidemia:  Goal on Track (progressing): YES. Uncontrolled-LDL 74, almost at goal; current treatment:ATORVASTATIN; lipids due in august 2023 Medications  previously tried: N/A  Current dietary patterns: RECOMMENDED HEART HEALTHY/HEALTHY PLATE METHOD Lipid Panel     Component Value Date/Time   CHOL 124 08/27/2017 1314   TRIG 126 08/27/2017 1314   TRIG 108 05/09/2014 1403   HDL 53 08/27/2017 1314   HDL 51 05/09/2014 1403   CHOLHDL 2.3 08/27/2017 1314   LDLCALC 46 08/27/2017 1314   LDLDIRECT 74 10/16/2020 1509   LABVLDL 25 08/27/2017 1314  Lipid Panel     Component Value Date/Time   CHOL 124 08/27/2017 1314   TRIG 126 08/27/2017 1314   TRIG 108 05/09/2014 1403   HDL 53 08/27/2017 1314   HDL 51 05/09/2014 1403   CHOLHDL 2.3 08/27/2017 1314   LDLCALC 46 08/27/2017 1314   LDLDIRECT 74 10/16/2020 1509   LABVLDL 25 08/27/2017 1314  Patient Goals/Self-Care Activities patient will:  - take medications as prescribed as evidenced by patient report and record review check glucose DAILY FASTING OR IF SYMPTOMATIC, document, and provide at future appointments collaborate with provider on medication access solutions target a minimum of 150 minutes of moderate intensity exercise weekly engage in dietary modifications by FOLLOWING HEALTHY PLATE METHOD HANDOUT /heart healthy diet     Follow Up:  Patient agrees to Care Plan and Follow-up.  Plan: Telephone follow up appointment with care management team member scheduled for:  3 months  Regina Eck, PharmD, BCPS Clinical Pharmacist, [place]  II Phone [phone]

## 2021-11-05 NOTE — Patient Instructions (Addendum)
Visit Information  Following are the goals we discussed today:  Current Barriers:  Unable to independently afford treatment regimen Unable to maintain control of T2DM--NEED PRESCRIPTION ASSISTANCE Suboptimal therapeutic regimen for T2DM  Pharmacist Clinical Goal(s):  patient will verbalize ability to afford treatment regimen maintain control of T2DM, HLD as evidenced by GOAL A1C<7% AND GOAL LDL<70  adhere to plan to optimize therapeutic regimen for T2DM as evidenced by report of adherence to recommended medication management changes through collaboration with PharmD and provider.   Interventions: 1:1 collaboration with [name], DO regarding development and update of comprehensive plan of care as evidenced by provider attestation and co-signature Inter-disciplinary care team collaboration (see longitudinal plan of care) Comprehensive medication review performed; medication list updated in electronic medical record  Diabetes: Goal on Track (progressing): YES. Controlled- slight increase 7.1%--> 7.7%; GFR 91 CURRENT treatment:  BASAGLAR 16-20 units, FARXIGA '10MG'$  daily (continue current regimen for now)  Previous treatments: JANUVIA WAS DISCONTINUED at last PCP visit in 2022 (may have to add back; onglyza would be easy to obtain if we can't do GLP1-GI history approx 1 yr ago; will address) 2023 PATIENT ASSISTANCE COMPLETED FOR AZ&ME (FARXIGA) AND LILLY CARES (BASAGLAR) [place] Current glucose readings: fasting glucose: <130, post prandial glucose: <180 Hasn't been able to check; I requested patient bring her glucometer to me and I will assist with set up and education Attempting to get CGM --> Rx sent for Libre 3 CGM system via [employer] (via parachute portal) Denies hypoglycemic/hyperglycemic symptoms Discussed meal planning options and Plate method for healthy eating Avoid sugary drinks and desserts Incorporate balanced protein, non starchy  veggies, 1 serving of carbohydrate with each meal Increase water intake Increase physical activity as able Current exercise: N/A--DIFFICULTY WALKING Educated on MEDICATIONS (PURPOSE AND SIDE EFFECTS); needs to check blood sugar Recommended BASAGLAR AND FARXIGA (AVOIDING GLP1 GIVEN GI HISTORY); may consider DPP4 if needed (would like to reduce insulin) Assessed patient finances. PATIENT ASSISTANCE active through 02/15/22 FOR AZ&ME (FARXIGA) AND L[employer]Y CARES (BASAGLAR)  Hyperlipidemia:  Goal on Track (progressing): YES. Uncontrolled-LDL 74, almost at goal; current treatment:ATORVASTATIN; lipids due in august 2023 Medications previously tried: N/A  Current dietary patterns: RECOMMENDED HEART HEALTHY/HEALTHY PLATE METHOD Lipid Panel     Component Value Date/Time   CHOL 124 08/27/2017 1314   TRIG 126 08/27/2017 1314   TRIG 108 05/09/2014 1403   HDL 53 08/27/2017 1314   HDL 51 05/09/2014 1403   CHOLHDL 2.3 08/27/2017 1314   LDLCALC 46 08/27/2017 1314   LDLDIRECT 74 10/16/2020 1509   LABVLDL 25 08/27/2017 1314  Lipid Panel     Component Value Date/Time   CHOL 124 08/27/2017 1314   TRIG 126 08/27/2017 1314   TRIG 108 05/09/2014 1403   HDL 53 08/27/2017 1314   HDL 51 05/09/2014 1403   CHOLHDL 2.3 08/27/2017 1314   LDLCALC 46 08/27/2017 1314   LDLDIRECT 74 10/16/2020 1509   LABVLDL 25 08/27/2017 1314   Patient Goals/Self-Care Activities patient will:  - take medications as prescribed as evidenced by patient report and record review check glucose DAILY FASTING OR IF SYMPTOMATIC, document, and provide at future appointments collaborate with provider on medication access solutions target a minimum of 150 minutes of moderate intensity exercise weekly engage in dietary modifications by FOLLOWING HEALTHY PLATE METHOD HANDOUT/heart healthy diet   Plan: Telephone follow up appointment with care management team member scheduled for:  3 months  Signature Regina Eck, PharmD,  BCPS Clinical Pharmacist, [place]  II Phone [phone]   Please call the care guide team at [phone] if you need to cancel or reschedule your appointment.   The patient verbalized understanding of instructions, educational materials, and care plan provided today and DECLINED offer to receive copy of patient instructions, educational materials, and care plan.

## 2021-11-15 DIAGNOSIS — E1169 Type 2 diabetes mellitus with other specified complication: Secondary | ICD-10-CM

## 2021-11-15 DIAGNOSIS — E785 Hyperlipidemia, unspecified: Secondary | ICD-10-CM

## 2021-11-15 DIAGNOSIS — Z794 Long term (current) use of insulin: Secondary | ICD-10-CM | POA: Diagnosis not present

## 2021-12-10 ENCOUNTER — Telehealth: Payer: Medicare PPO

## 2021-12-22 DIAGNOSIS — Z961 Presence of intraocular lens: Secondary | ICD-10-CM | POA: Diagnosis not present

## 2021-12-22 DIAGNOSIS — E119 Type 2 diabetes mellitus without complications: Secondary | ICD-10-CM | POA: Diagnosis not present

## 2021-12-24 IMAGING — CT CT ABD-PELV W/ CM
2 of 5 series · 15 of 46 positions shown, 17 images · IV contrast (Omnipaque or Isovue)
Comparison: September 01, 2017

CLINICAL DATA: Abdominal pain with nausea.  Umbilical region hernia

EXAM:
CT ABDOMEN AND PELVIS WITH CONTRAST
TECHNIQUE: Multidetector CT imaging of the abdomen and pelvis was performed
using the standard protocol following bolus administration of
intravenous contrast.
CONTRAST:  100mL OMNIPAQUE IOHEXOL 300 MG/ML  SOLN

[Series 2: axial st · axial · 0.80mm/px · z∈[+960,+1350]mm · 12 of 92 slices shown, 14 images]
[im 7/92  soft-tissue]
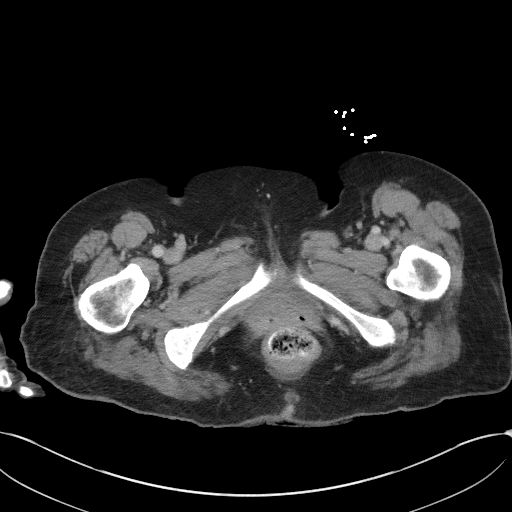
[im 7/92  bone]
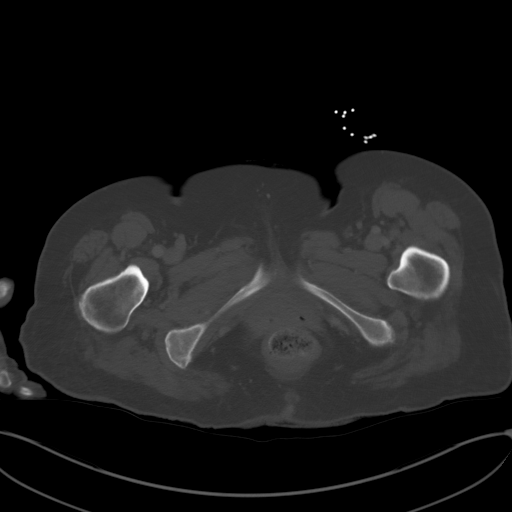
[im 14/92  soft-tissue]
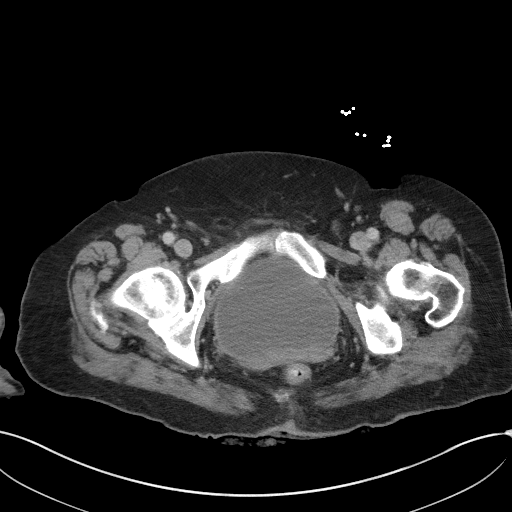
[im 20/92  soft-tissue]
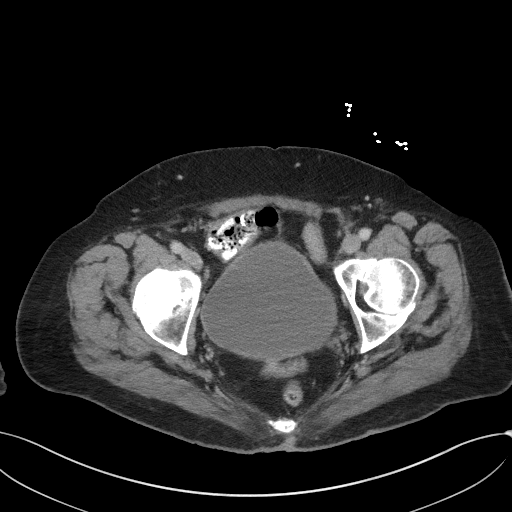
[im 27/92  soft-tissue]
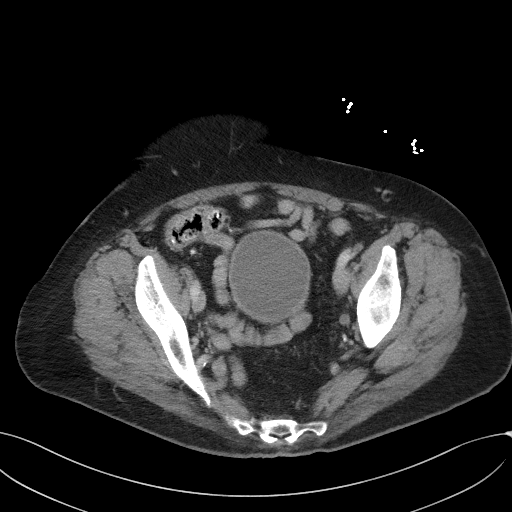
[im 33/92  soft-tissue]
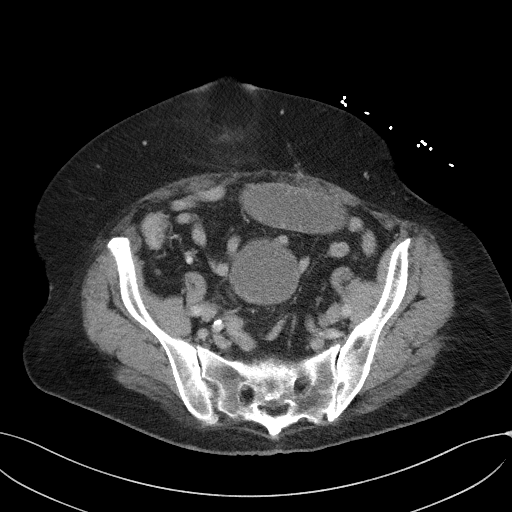
[im 40/92  soft-tissue]
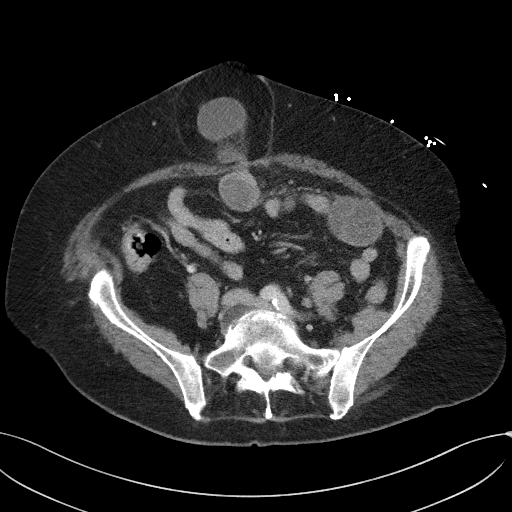
[im 53/92  soft-tissue]
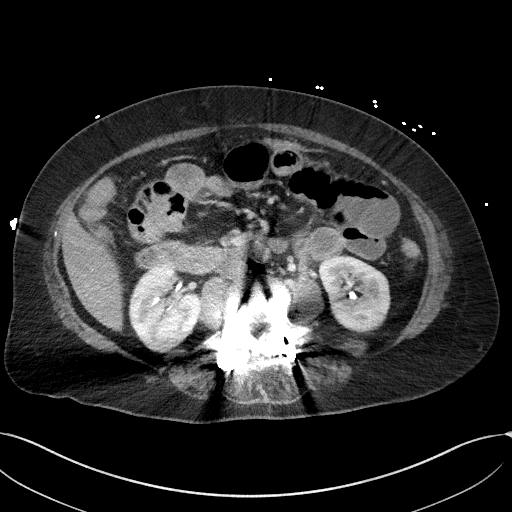
[im 59/92  soft-tissue]
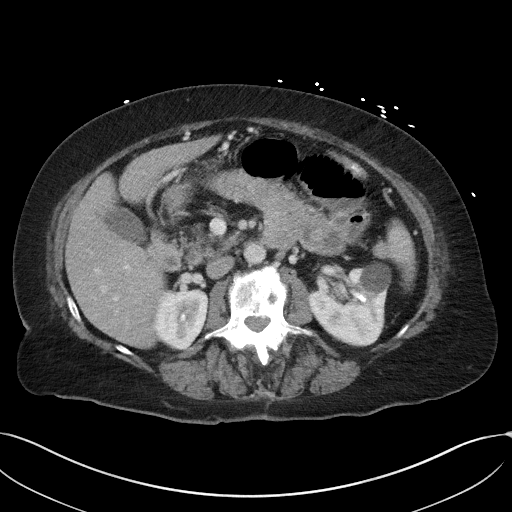
[im 66/92  soft-tissue]
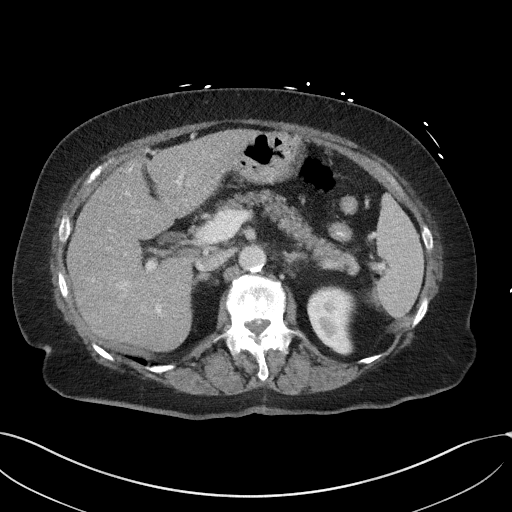
[im 66/92  bone]
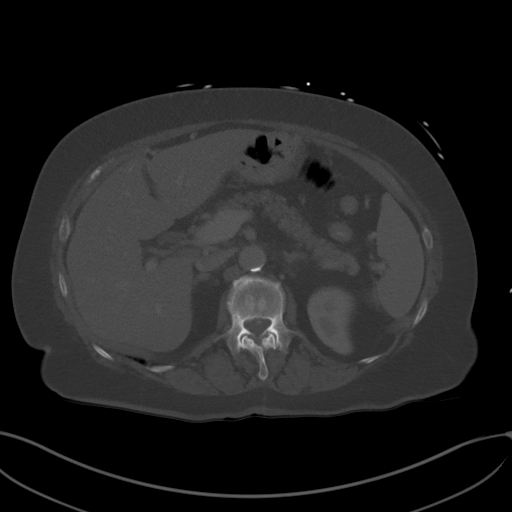
[im 72/92  soft-tissue]
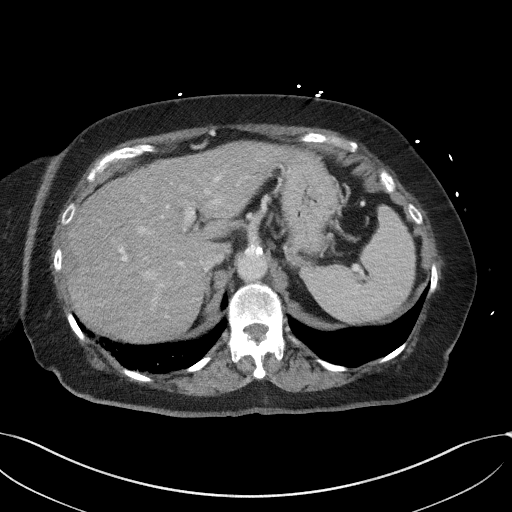
[im 79/92  soft-tissue]
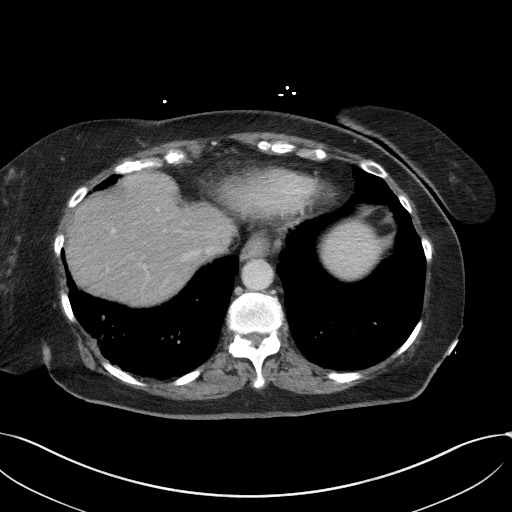
[im 85/92  soft-tissue]
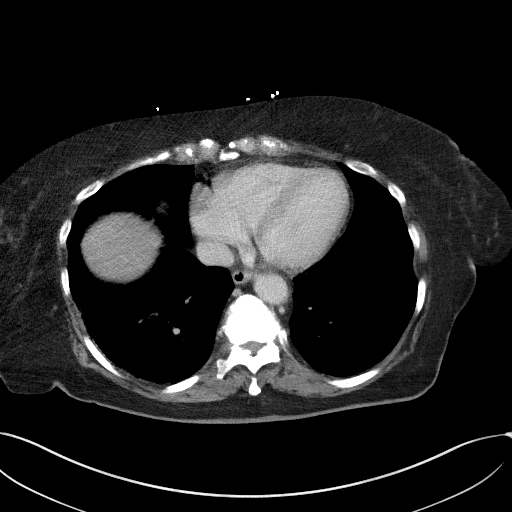

[Series 5: coronal st · coronal · 0.80mm/px · 3 of 117 slices shown]
[im 39/117  soft-tissue]
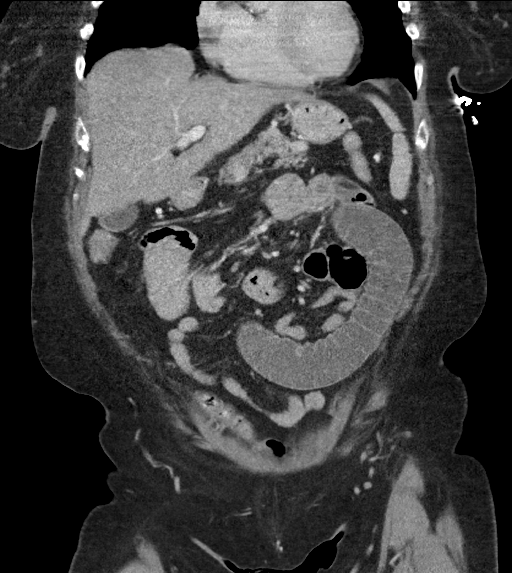
[im 52/117  soft-tissue]
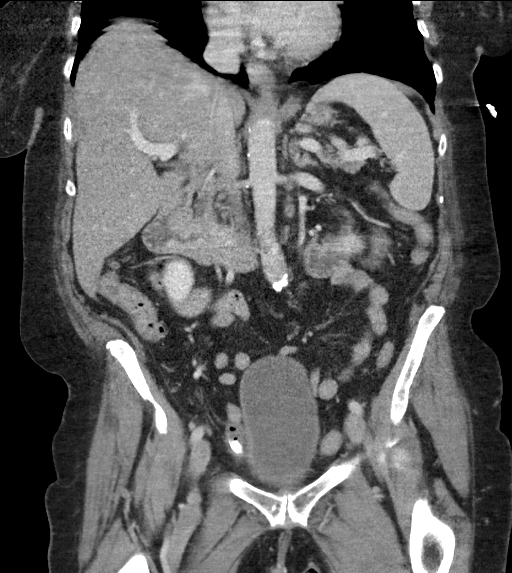
[im 65/117  soft-tissue]
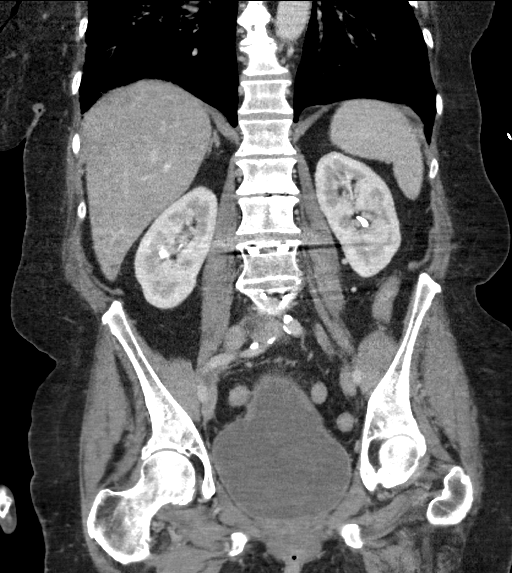

[15 of 46 positions shown; findings below may reference images not displayed]

FINDINGS: Lower chest: There is atelectatic change in the posterior and
lateral right base regions. There is a 3 mm nodular opacity abutting
the pleura in the posterior segment of the left lower lobe. There is
a small foramen of Bochdalek hernia on the left posteriorly
containing only fat.

Hepatobiliary: There is hepatic steatosis. No focal liver lesions
are evident. Gallbladder wall is not appreciably thickened. There is
no biliary duct dilatation.

Pancreas: No pancreatic mass or inflammatory focus.

Spleen: No splenic lesions are evident.

Adrenals/Urinary Tract: Adrenals bilaterally appear normal. There is
a cyst arising from the mid left kidney measuring 2.5 x 2.5 cm.
There is no appreciable hydronephrosis on either side. No intrarenal
calculus evident; note that contrast is seen in the collecting
systems bilaterally which could mask small nonobstructing intrarenal
calculi. There is no demonstrable ureteral calculus on either side.
Urinary bladder is midline with wall thickness within normal limits.

Stomach/Bowel: There is a sizable inguinal hernia. Small bowel
extends into this hernia. There is a transition zone at the level of
the umbilical hernia consistent with bowel obstruction at the level
of the umbilical hernia. There appears to be a degree twisting of
the small bowel within this hernia. There is no free air or portal
venous air. The terminal ileum appears normal.

Vascular/Lymphatic: No abdominal aortic aneurysm. There is aortic
and iliac artery atherosclerosis. Major venous structures appear
patent. There is no evident adenopathy in the abdomen or pelvis.

Reproductive: Uterus absent.  No pelvic mass.

Other: Appendix appears normal. No evident abscess or ascites in the
abdomen or pelvis. The umbilical hernia that contains bowel with
obstruction is mentioned above. This hernia also contains fat. The
neck of this hernia measures 1.6 cm from superior to inferior
dimension and 2.0 cm from right to left dimension.

Musculoskeletal: Postoperative changes noted in the lower lumbar
spine. There is degenerative type change in the lumbar spine and hip
joints. No blastic or lytic bone lesions. No intramuscular lesions
are evident. Note that there is moderately severe spinal stenosis at
L3-4 due to bony hypertrophy and diffuse disc protrusion. Moderate
stenosis is also noted at L4-5 due to similar factors.
IMPRESSION: 1. There is an umbilical hernia which contains fat and a loop of
small bowel. There is apparent twisting of bowel within this hernia
with focal transition zone indicative of focal small-bowel
obstruction at the level of this umbilical hernia. There is no
intramural air or fluid surrounding this twisted bowel within the
hernia. No free air.

2.  Appendix appears normal.  No abscess in the abdomen or pelvis.

3. Fairly severe spinal stenosis at L3-4 and to a slightly lesser
extent at L4-5 due to disc protrusion and bony hypertrophy.

4.  Aortic Atherosclerosis ([id number]).

5. 3 mm nodular opacity abutting the pleura in the posterior left
base. No follow-up needed if patient is low-risk. Non-contrast chest
CT can be considered in 12 months if patient is high-risk. This
recommendation follows the consensus statement: Guidelines for
Management of Incidental Pulmonary Nodules Detected on CT Images:

6.  Uterus absent.

## 2021-12-26 ENCOUNTER — Other Ambulatory Visit: Payer: Self-pay | Admitting: Family Medicine

## 2021-12-26 DIAGNOSIS — Z1231 Encounter for screening mammogram for malignant neoplasm of breast: Secondary | ICD-10-CM

## 2022-01-05 ENCOUNTER — Telehealth: Payer: Self-pay | Admitting: Family Medicine

## 2022-01-05 NOTE — Telephone Encounter (Signed)
Patient needs to talk to Almyra Free about re enrollment for patient assistance. Please call back

## 2022-01-06 NOTE — Telephone Encounter (Signed)
Please call patient re: patient assistance

## 2022-01-12 ENCOUNTER — Ambulatory Visit
Admission: RE | Admit: 2022-01-12 | Discharge: 2022-01-12 | Disposition: A | Discharge status: Home / Self Care | Payer: Medicare PPO | Source: Ambulatory Visit | Attending: Family Medicine | Admitting: Family Medicine

## 2022-01-12 DIAGNOSIS — Z1231 Encounter for screening mammogram for malignant neoplasm of breast: Secondary | ICD-10-CM | POA: Diagnosis not present

## 2022-01-14 ENCOUNTER — Other Ambulatory Visit: Payer: Self-pay | Admitting: Family Medicine

## 2022-01-14 DIAGNOSIS — E1169 Type 2 diabetes mellitus with other specified complication: Secondary | ICD-10-CM

## 2022-01-14 NOTE — Telephone Encounter (Signed)
Pt wanted to know how to go about re-enrollment for her medications. Informed pt that I help Almyra Free with assistance and I will be mailing her re-enrollment apps for az&me and Lilly cares.

## 2022-01-14 NOTE — Telephone Encounter (Signed)
Left message requesting call back regarding pap application  Call back [phone]

## 2022-01-16 ENCOUNTER — Ambulatory Visit (INDEPENDENT_AMBULATORY_CARE_PROVIDER_SITE_OTHER): Payer: Medicare PPO | Admitting: Family Medicine

## 2022-01-16 ENCOUNTER — Encounter: Payer: Self-pay | Admitting: Family Medicine

## 2022-01-16 VITALS — BP 122/80 | HR 75 | Temp 97.5°F | Ht 68.0 in | Wt 177.6 lb

## 2022-01-16 DIAGNOSIS — Z794 Long term (current) use of insulin: Secondary | ICD-10-CM | POA: Diagnosis not present

## 2022-01-16 DIAGNOSIS — E785 Hyperlipidemia, unspecified: Secondary | ICD-10-CM

## 2022-01-16 DIAGNOSIS — Z720 Tobacco use: Secondary | ICD-10-CM

## 2022-01-16 DIAGNOSIS — E1169 Type 2 diabetes mellitus with other specified complication: Secondary | ICD-10-CM

## 2022-01-16 LAB — BAYER DCA HB A1C WAIVED: HB A1C (BAYER DCA - WAIVED): 8.2 % — ABNORMAL HIGH (ref 4.8–5.6)

## 2022-01-16 NOTE — Progress Notes (Signed)
Subjective: CC:DM PCP: Debra Norlander, DO Debra Miranda is a 76 y.o. female presenting to clinic today for:  1. Type 2 Diabetes with hyperlipidemia:  Reports that she is not checking BGs because her POC has been erratic.  Could not afford [employer].  Debra Miranda, with CCM, for Debra Miranda and insulin.  Denies any polydipsia but does report polyuria such that she has to wear depends at nighttime.  No vaginal discharge or vaginitis.  No chest pain, shortness of breath.  Treated with Lipitor.  Last eye exam: Up-to-date Last foot exam: Up-to-date Last A1c:  Lab Results  Component Value Date   HGBA1C 7.7 (H) 09/15/2021   Nephropathy screen indicated?: needs Last flu, zoster and/or pneumovax:  Immunization History  Administered Date(s) Administered   COVID-19, mRNA, vaccine(Comirnaty)12 years and older 11/25/2021   Fluad Quad(high Dose 65+) 11/15/2018, 11/22/2019   Influenza, High Dose Seasonal PF 11/29/2015, 11/18/2016, 12/06/2017   Influenza,inj,Quad PF,6+ Mos 12/07/2013, 11/12/2014   Influenza-Unspecified 11/20/2020   Moderna Covid Bivalent Peds Booster(38moThru 565yr 07/08/2021   PFIZER(Purple Top)SARS-COV-2 Vaccination 04/16/2019, 05/16/2019, 11/13/2019, 05/23/2020, 11/20/2020   Pneumococcal Conjugate-13 03/17/2013   Pneumococcal Polysaccharide-23 07/29/2017, 02/09/2018   Respiratory Syncytial Virus Vaccine,Recomb Aduvanted(Arexvy) 12/10/2021   Zoster Recombinat (Shingrix) 03/18/2021, 09/15/2021    ROS: No chest pain, shortness of breath, falls reported.    ROS: Per HPI  Allergies  Allergen Reactions   Augmentin [Amoxicillin-Pot Clavulanate] Nausea And Vomiting   Latex Itching   Past Medical History:  Diagnosis Date   Arthritis    right hand   Cataract    bilateral   Depression    many years ago, no meds   Diabetes mellitus without complication ([facility]   type 2   Endometrial adenocarcinoma (H[place]11/20/2019   treated with HDR   Environmental  allergies    Family history of breast cancer    Family history of kidney cancer    Family history of pancreatic cancer    Family history of stomach cancer    Family history of uterine cancer    Hemorrhoids    History of radiation therapy 04/05/18,02/25,3/3,3/10,3/18   04/05/2018, 04/12/2018, 04/19/2018, 04/26/2018, 05/04/2018: Vagina, Iridium HDR, 3 cm cylinder, tx length of 3 cm / 30 Gy in 5 fractions.. Dr. JaGery Pray Incarcerated umbilical hernia    Nodular basal cell carcinoma (BCC) 08/2017   LEFT LOWER LEG above foot   Orthostatic hypotension    Patient is unaware of this diagnosis   Strangulated umbilical hernia     Current Outpatient Medications:    atorvastatin (LIPITOR) 20 MG tablet, TAKE 1 TABLET BY MOUTH EVERY DAY, Disp: 90 tablet, Rfl: 0   Continuous Blood Gluc Sensor (FREESTYLE LIBRE 3 SENSOR) MISC, by Does not apply route., Disp: , Rfl:    dapagliflozin propanediol (Debra Miranda) 10 MG TABS tablet, Take 1 tablet (10 mg total) by mouth daily., Disp: 90 tablet, Rfl: 4   escitalopram (LEXAPRO) 10 MG tablet, TAKE 1 TABLET BY MOUTH EVERY DAY, Disp: 90 tablet, Rfl: 0   Insulin Glargine (BASAGLAR KWIKPEN) 100 UNIT/ML, Inject 20 Units into the skin at bedtime. Sample #1pen LOT [id number], EXP 8/21 (Patient taking differently: Inject 16 Units into the skin at bedtime. Sample #1pen LOT [id number], EXP 8/21), Disp: 15 mL, Rfl: 3   Lactobacillus-Inulin (CUGrand BeachCAPS, Take 1 capsule daily for digestive health, Disp: 90 capsule, Rfl: 3 Social History   Socioeconomic History   Marital status: Widowed    Spouse name:  Not on file   Number of children: 1   Years of education: 14   Highest education level: Associate degree: occupational, Hotel manager, or vocational program  Occupational History   Occupation: Retired    Comment: [employer]  Tobacco Use   Smoking status: Every Day    Types: Cigars   Smokeless tobacco: Never   Tobacco comments:    4 small cigars a  day  Vaping Use   Vaping Use: Former   Start date: 02/16/2010   Quit date: 02/17/2011   Devices: unsure  Substance and Sexual Activity   Alcohol use: Not Currently   Drug use: No   Sexual activity: Not Currently  Other Topics Concern   Not on file  Social History Narrative   Her daughter lives with her and helps with bills and ride if she needs   Patient is pretty independent, but sometimes has excessive pain and needs assistance   Social Determinants of Health   Financial Resource Strain: Low Risk  (08/13/2021)   Overall Financial Resource Strain (CARDIA)    Difficulty of Paying Living Expenses: Not hard at all  Food Insecurity: No Food Insecurity (08/13/2021)   Hunger Vital Sign    Worried About Running Out of Food in the Last Year: Never true    [place] in the Last Year: Never true  Transportation Needs: No Transportation Needs (08/13/2021)   PRAPARE - Hydrologist (Medical): No    Lack of Transportation (Non-Medical): No  Physical Activity: Inactive (08/13/2021)   Exercise Vital Sign    Days of Exercise per Week: 0 days    Minutes of Exercise per Session: 0 min  Stress: No Stress Concern Present (08/13/2021)   [place]    Feeling of Stress : Not at all  Social Connections: Moderately Integrated (08/13/2021)   Social Connection and Isolation Panel [NHANES]    Frequency of Communication with Friends and Family: More than three times a week    Frequency of Social Gatherings with Friends and Family: More than three times a week    Attends Religious Services: More than 4 times per year    Active Member of [employer] or Organizations: Yes    Attends Archivist Meetings: More than 4 times per year    Marital Status: Widowed  Intimate Partner Violence: Not At Risk (08/13/2021)   Humiliation, Afraid, Rape, and Kick questionnaire    Fear of Current or Ex-Partner: No     Emotionally Abused: No    Physically Abused: No    Sexually Abused: No   Family History  Problem Relation Age of Onset   Cervical cancer Mother 30       treated with radiation   Pancreatic cancer Mother 87   Stroke Father    Pulmonary fibrosis Brother    Alzheimer's disease Brother    Stomach cancer Brother        dx 1s   Diabetes Brother    Lung cancer Maternal Grandmother    Stroke Paternal Grandfather    Uterine cancer Maternal Aunt        dx 25s   Kidney cancer Maternal Uncle        dx 55s   Colon cancer Maternal Uncle        dx 63s   Breast cancer Cousin        maternal cousin dx 26    Objective: Office vital signs  reviewed. BP 122/80   Pulse 75   Temp (!) 97.5 F (36.4 C)   Ht _0  (1.727 m)   Wt 177 lb 9.6 oz (80.6 kg)   SpO2 95%   BMI 27.00 kg/m   Physical Examination:  General: Awake, alert, chronically ill-appearing elderly female, No acute distress HEENT: Sclera white.  Moist mucous membranes Cardio: regular rate and rhythm, S1S2 heard, no murmurs appreciated Pulm: clear to auscultation bilaterally, no wheezes, rhonchi or rales; normal work of breathing on room air    Assessment/ Plan: 76 y.o. female   Type 2 diabetes mellitus with other specified complication, with long-term current use of insulin ([place]) - Plan: CMP14+EGFR, Bayer DCA Hb A1c Waived, Microalbumin / creatinine urine ratio  Hyperlipidemia associated with type 2 diabetes mellitus ([place]) - Plan: CMP14+EGFR  Tobacco use  Sugar not at goal with A1c of 8.2% today.  This is a rise from 7.7% at last visit.  We discussed need for reduction in blood sugar.  I think largely her uncontrolled sugar has to do with inadequate insulin administration and adequate sugar monitoring.  I had Almyra Free take a look into the freestyle libre and she is going to work on this for her.  She gave her a months worth of libre samples and encouraged her to monitor her sugars closely.  She will reconvene with the patient  in 1 month.  I gave the patient a handout on how to gradually titrate her insulin pending her blood sugars.  She may increase by 1 unit every 2 days for fasting blood sugar above 150.  See back in 3 months, sooner if concerns arise   No orders of the defined types were placed in this encounter.  No orders of the defined types were placed in this encounter.    Debra Norlander, DO [place] [phone]

## 2022-01-16 NOTE — Patient Instructions (Signed)
Go up to 18 units. Monitor sugar.  If your FASTING blood sugar is above 150 for 2 consecutive days, increase by 1 unit of your long acting insulin.  Example: Day 1: Blood sugar was 159 Day 2: Blood sugar was 205  Increase Basaglar to 19 units Day 3: Blood sugar is 202 Day 4: Blood sugars 168  Increase Basaglar to 20 units Day 5: Blood sugar is 105 Day 6: Blood sugars 145  Stick with 20 units.  Do not increase.

## 2022-01-17 LAB — CMP14+EGFR
ALT: 13 IU/L (ref 0–32)
AST: 12 IU/L (ref 0–40)
Albumin/Globulin Ratio: 1.6 (ref 1.2–2.2)
Albumin: 4.3 g/dL (ref 3.8–4.8)
Alkaline Phosphatase: 123 IU/L — ABNORMAL HIGH (ref 44–121)
BUN/Creatinine Ratio: 17 (ref 12–28)
BUN: 13 mg/dL (ref 8–27)
Bilirubin Total: 0.4 mg/dL (ref 0.0–1.2)
CO2: 24 mmol/L (ref 20–29)
Calcium: 9.5 mg/dL (ref 8.7–10.3)
Chloride: 101 mmol/L (ref 96–106)
Creatinine, Ser: 0.75 mg/dL (ref 0.57–1.00)
Globulin, Total: 2.7 g/dL (ref 1.5–4.5)
Glucose: 197 mg/dL — ABNORMAL HIGH (ref 70–99)
Potassium: 4.4 mmol/L (ref 3.5–5.2)
Sodium: 140 mmol/L (ref 134–144)
Total Protein: 7 g/dL (ref 6.0–8.5)
eGFR: 83 mL/min/{1.73_m2} (ref >59)

## 2022-01-17 LAB — MICROALBUMIN / CREATININE URINE RATIO
Creatinine, Urine: 72.1 mg/dL
Microalb/Creat Ratio: 10 mg/g creat (ref 0–29)
Microalbumin, Urine: 7.2 ug/mL

## 2022-01-22 ENCOUNTER — Telehealth: Payer: Self-pay | Admitting: Family Medicine

## 2022-01-22 NOTE — Telephone Encounter (Signed)
Patient calling because she has not received the application in the mail for her assistance. She was told that if it is not back to them by the 15th of December then she will not be able to get any assistance this year. Would like to speak with Debra Miranda about this. Please call back ASAP

## 2022-02-12 ENCOUNTER — Ambulatory Visit: Payer: Medicare PPO | Admitting: Pharmacist

## 2022-03-03 ENCOUNTER — Other Ambulatory Visit (HOSPITAL_COMMUNITY): Payer: Self-pay

## 2022-03-12 ENCOUNTER — Telehealth: Payer: Self-pay

## 2022-03-12 NOTE — Telephone Encounter (Signed)
Received notification from [place] regarding approval for [facility]. Patient assistance approved from 03/04/22 to 02/16/23.  Medication will ship to pt's home  Phone: [phone]

## 2022-04-13 ENCOUNTER — Other Ambulatory Visit: Payer: Self-pay | Admitting: Family Medicine

## 2022-04-13 DIAGNOSIS — E785 Hyperlipidemia, unspecified: Secondary | ICD-10-CM

## 2022-04-13 DIAGNOSIS — E1169 Type 2 diabetes mellitus with other specified complication: Secondary | ICD-10-CM

## 2022-04-14 ENCOUNTER — Telehealth: Payer: Self-pay | Admitting: *Deleted

## 2022-04-14 NOTE — Telephone Encounter (Signed)
Returned the patient's after hours call and stated "Your appt is with Dr Sondra Come on 3/25 at 3 pm and then you see Dr Berline Lopes in September." Patient verbalized understanding

## 2022-04-17 ENCOUNTER — Ambulatory Visit (INDEPENDENT_AMBULATORY_CARE_PROVIDER_SITE_OTHER): Payer: Medicare PPO | Admitting: Family Medicine

## 2022-04-17 ENCOUNTER — Telehealth: Payer: Self-pay | Admitting: Family Medicine

## 2022-04-17 ENCOUNTER — Encounter: Payer: Self-pay | Admitting: Family Medicine

## 2022-04-17 VITALS — BP 131/84 | HR 82 | Temp 98.5°F | Ht 68.0 in | Wt 177.0 lb

## 2022-04-17 DIAGNOSIS — Z794 Long term (current) use of insulin: Secondary | ICD-10-CM

## 2022-04-17 DIAGNOSIS — E785 Hyperlipidemia, unspecified: Secondary | ICD-10-CM

## 2022-04-17 DIAGNOSIS — E1169 Type 2 diabetes mellitus with other specified complication: Secondary | ICD-10-CM | POA: Diagnosis not present

## 2022-04-17 LAB — BAYER DCA HB A1C WAIVED: HB A1C (BAYER DCA - WAIVED): 8.9 % — ABNORMAL HIGH (ref 4.8–5.6)

## 2022-04-17 MED ORDER — FREESTYLE LIBRE 3 SENSOR MISC: 3 refills | Status: DC

## 2022-04-17 MED ORDER — FREESTYLE LIBRE 3 READER DEVI: 1.0000 | Freq: Every day | 0 refills | Status: DC

## 2022-04-17 NOTE — Telephone Encounter (Signed)
Patient needs an appt with Almyra Free for insulin titration per Dr Darnell Level

## 2022-04-17 NOTE — Patient Instructions (Signed)
Go up to 20 units of insulin If your FASTING blood sugar is above 150 for 2 consecutive days, increase by 1 unit of your long acting insulin.  Example: Day 1: Blood sugar was 159 Day 2: Blood sugar was 205  Increase Basaglar to 21 units Day 3: Blood sugar is 202 Day 4: Blood sugars 168  Increase Basaglar to 22 units Day 5: Blood sugar is 105 Day 6: Blood sugars 145  Stick with 22 units.  Do not increase.

## 2022-04-17 NOTE — Progress Notes (Signed)
Subjective: CC:DM. PCP: [name], DO [id number] Debra Miranda is a 77 y.o. female presenting to clinic today for:  1. Type 2 Diabetes with hyperlipidemia:  She admits that she forgot to follow-up with Almyra Free.  She did not utilize the freestyle libre because her phone would not work with it.  She continues to take [place].  She continues in and out cath but does sometimes urinate on her own.  No reports of dysuria, hematuria or genital discoloration.  She is been injecting 18 units of Basaglar.  Has not advance the dose because she is not checking blood sugars at all.  She reports thirst.  No reports of blurred vision.  She reports that the intermittent dizziness that she would experience with high blood sugars has totally resolved.  Has increased vegetable knee consumption and decrease bread but not eliminated bread.  She has tried to eliminate sweets totally however.  Last eye exam: UTD Last foot exam: needs Last A1c:  Lab Results  Component Value Date   HGBA1C 8.2 (H) 01/16/2022   Nephropathy screen indicated?: UTD Last flu, zoster and/or pneumovax:  Immunization History  Administered Date(s) Administered   COVID-19, mRNA, vaccine(Comirnaty)12 years and older 11/25/2021   Fluad Quad(high Dose 65+) 11/15/2018, 11/22/2019   Influenza, High Dose Seasonal PF 11/29/2015, 11/18/2016, 12/06/2017   Influenza,inj,Quad PF,6+ Mos 12/07/2013, 11/12/2014   Influenza-Unspecified 11/20/2020   Moderna Covid Bivalent Peds Booster(20moThru 540yr 07/08/2021   PFIZER(Purple Top)SARS-COV-2 Vaccination 04/16/2019, 05/16/2019, 11/13/2019, 05/23/2020, 11/20/2020   Pneumococcal Conjugate-13 03/17/2013   Pneumococcal Polysaccharide-23 07/29/2017, 02/09/2018   Respiratory Syncytial Virus Vaccine,Recomb Aduvanted(Arexvy) 12/10/2021   Zoster Recombinat (Shingrix) 03/18/2021, 09/15/2021     ROS: Per HPI  Allergies  Allergen Reactions   Augmentin [Amoxicillin-Pot Clavulanate] Nausea And Vomiting    Latex Itching   Past Medical History:  Diagnosis Date   Arthritis    right hand   Cataract    bilateral   Depression    many years ago, no meds   Diabetes mellitus without complication (H[place]   type 2   Endometrial adenocarcinoma (HCGalt11/20/2019   treated with HDR   Environmental allergies    Family history of breast cancer    Family history of kidney cancer    Family history of pancreatic cancer    Family history of stomach cancer    Family history of uterine cancer    Hemorrhoids    History of radiation therapy 04/05/18,02/25,3/3,3/10,3/18   04/05/2018, 04/12/2018, 04/19/2018, 04/26/2018, 05/04/2018: Vagina, Iridium HDR, 3 cm cylinder, tx length of 3 cm / 30 Gy in 5 fractions.. Dr. JaGery Pray Incarcerated umbilical hernia    Nodular basal cell carcinoma (BCC) 08/2017   LEFT LOWER LEG above foot   Orthostatic hypotension    Patient is unaware of this diagnosis   Strangulated umbilical hernia     Current Outpatient Medications:    atorvastatin (LIPITOR) 20 MG tablet, TAKE 1 TABLET BY MOUTH EVERY DAY, Disp: 90 tablet, Rfl: 0   Continuous Blood Gluc Sensor (FREESTYLE LIBRE 3 SENSOR) MISC, by Does not apply route., Disp: , Rfl:    dapagliflozin propanediol (FARXIGA) 10 MG TABS tablet, Take 1 tablet (10 mg total) by mouth daily., Disp: 90 tablet, Rfl: 4   escitalopram (LEXAPRO) 10 MG tablet, TAKE 1 TABLET BY MOUTH EVERY DAY, Disp: 90 tablet, Rfl: 0   Insulin Glargine (BASAGLAR KWIKPEN) 100 UNIT/ML, Inject 20 Units into the skin at bedtime. Sample #1pen LOT [id number], EXP 8/21 (Patient taking  differently: Inject 16 Units into the skin at bedtime. Sample #1pen LOT [id number], EXP 8/21), Disp: 15 mL, Rfl: 3   Lactobacillus-Inulin ([place]) CAPS, Take 1 capsule daily for digestive health, Disp: 90 capsule, Rfl: 3 Social History   Socioeconomic History   Marital status: Widowed    Spouse name: Not on file   Number of children: 1   Years of education: 14    Highest education level: Associate degree: occupational, Hotel manager, or vocational program  Occupational History   Occupation: Retired    Comment: [employer]  Tobacco Use   Smoking status: Every Day    Types: Cigars   Smokeless tobacco: Never   Tobacco comments:    4 small cigars a day  Vaping Use   Vaping Use: Former   Start date: 02/16/2010   Quit date: 02/17/2011   Devices: unsure  Substance and Sexual Activity   Alcohol use: Not Currently   Drug use: No   Sexual activity: Not Currently  Other Topics Concern   Not on file  Social History Narrative   Her daughter lives with her and helps with bills and ride if she needs   Patient is pretty independent, but sometimes has excessive pain and needs assistance   Social Determinants of Health   Financial Resource Strain: Low Risk  (08/13/2021)   Overall Financial Resource Strain (CARDIA)    Difficulty of Paying Living Expenses: Not hard at all  Food Insecurity: No Food Insecurity (08/13/2021)   Hunger Vital Sign    Worried About Running Out of Food in the Last Year: Never true    [place] in the Last Year: Never true  Transportation Needs: No Transportation Needs (08/13/2021)   PRAPARE - Hydrologist (Medical): No    Lack of Transportation (Non-Medical): No  Physical Activity: Inactive (08/13/2021)   Exercise Vital Sign    Days of Exercise per Week: 0 days    Minutes of Exercise per Session: 0 min  Stress: No Stress Concern Present (08/13/2021)   [place]    Feeling of Stress : Not at all  Social Connections: Moderately Integrated (08/13/2021)   Social Connection and Isolation Panel [NHANES]    Frequency of Communication with Friends and Family: More than three times a week    Frequency of Social Gatherings with Friends and Family: More than three times a week    Attends Religious Services: More than 4 times per year     Active Member of [employer] or Organizations: Yes    Attends Archivist Meetings: More than 4 times per year    Marital Status: Widowed  Intimate Partner Violence: Not At Risk (08/13/2021)   Humiliation, Afraid, Rape, and Kick questionnaire    Fear of Current or Ex-Partner: No    Emotionally Abused: No    Physically Abused: No    Sexually Abused: No   Family History  Problem Relation Age of Onset   Cervical cancer Mother 63       treated with radiation   Pancreatic cancer Mother 84   Stroke Father    Pulmonary fibrosis Brother    Alzheimer's disease Brother    Stomach cancer Brother        dx 46s   Diabetes Brother    Lung cancer Maternal Grandmother    Stroke Paternal Grandfather    Uterine cancer Maternal Aunt  dx 53s   Kidney cancer Maternal Uncle        dx 55s   Colon cancer Maternal Uncle        dx 30s   Breast cancer Cousin        maternal cousin dx 25    Objective: Office vital signs reviewed. BP 131/84   Pulse 82   Temp 98.5 F (36.9 Debra)   Ht '5\' 8"'$  (1.727 m)   Wt 177 lb (80.3 kg)   SpO2 93%   BMI 26.91 kg/m   Physical Examination:  General: Awake, alert, chronically ill-appearing female, No acute distress HEENT: sclera white, MMM Cardio: regular rate and rhythm, S1S2 heard, no murmurs appreciated Pulm: Intermittent rhonchi noted with exhalation.  Normal work of breathing on room air    Assessment/ Plan: 77 y.o. female   Type 2 diabetes mellitus with other specified complication, with long-term current use of insulin ([place]) - Plan: Bayer DCA Hb A1c Waived, Continuous Blood Gluc Sensor (FREESTYLE LIBRE 3 SENSOR) MISC, Continuous Blood Gluc Receiver (FREESTYLE LIBRE 3 READER) DEVI  Hyperlipidemia associated with type 2 diabetes mellitus ([place])  Sugar is worsening with A1c rising to 8.9.  I had a frank discussion with her about the thirst being related to excess sugar in her blood.  I would like her to go up to 20 units of insulin, continue  [place].  I reordered freestyle libre sensors and scanner.  I have coordinated care with Almyra Free and her assistant.  I would like her to have close follow-up so that we can determine ongoing needs for insulin.  She will contact me if the freestyle is not covered or is too expensive at which point I will order a point-of-care meter for her.  She needs to be titrating this insulin up by 1 unit every 2 to 3 days for fasting blood sugars above 150.  This was reiterated in the handout, reviewed again with her and provided to her today  No orders of the defined types were placed in this encounter.  No orders of the defined types were placed in this encounter.    [name], DO [place] [phone]

## 2022-04-21 ENCOUNTER — Telehealth: Payer: Self-pay

## 2022-04-21 NOTE — Progress Notes (Unsigned)
  Care Management   Outreach Note  04/21/2022 Name: Debra Miranda MRN: [id number] DOB: 30-Jan-1946  An unsuccessful telephone outreach was attempted today to contact the patient about Chronic Care Management needs.    Follow Up Plan:  A HIPAA compliant phone message was left for the patient providing contact information and requesting a return call.  The care management team will reach out to the patient again over the next 7 days.  If patient returns call to provider office, please advise to call [place] * at [phone] Larsson, [place], [place] [address] Direct Dial: [phone] [email or web address]

## 2022-04-22 ENCOUNTER — Telehealth: Payer: Self-pay | Admitting: Pharmacist

## 2022-04-22 NOTE — Telephone Encounter (Signed)
    04/22/2022 Name: Debra Miranda MRN: [id number] DOB: Feb 08, 1946   Successful outreach to patient re: PharmD appt.  Patient scheduled to come to [facility] on Friday 04/24/22 at 1:30pm.  She is unable to check blood glucose using traditional glucometer and her Libre 3 CGM isn't compatible with her cell phone.  Recommended patient bring CGM and medications to upcoming appt.  We will explore options.  Medication access is difficult, therefore our options are limited in what we are able to prescribe.  Patient stated that she would likely not remember our call tomorrow and she is struggling with remembering things daily.  She is unable to recall if/when she takes her insulin/meds each day.  Her daughter currently lives with her, but she is often at work (requested she come to appt/unable).  Patient is not currently on medications for dementia.  I will route this note to PCP due to this concern.  Patient could benefit from PCP appt re: memory issues/dementia    Regina Eck, PharmD, [place] Pharmacist, [place]

## 2022-04-22 NOTE — Telephone Encounter (Signed)
[facility], will you get Debra Miranda scheduled for MMSE?  You'll probably need 41mn for that visit

## 2022-04-22 NOTE — Telephone Encounter (Signed)
R/C to [employer]

## 2022-04-22 NOTE — Progress Notes (Signed)
  Care Management   Outreach Note  04/22/2022 Name: Debra Miranda MRN: [id number] DOB: 1945-07-11  Second unsuccessful telephone outreach was attempted today to contact the patient about Chronic Care Management needs.    Follow Up Plan:  A HIPAA compliant phone message was left for the patient providing contact information and requesting a return call.  The care management team will reach out to the patient again over the next 7 days.  If patient returns call to provider office, please advise to call [place] * at [phone] Larsson, [place], [place] [address] Direct Dial: [phone] [email or web address]

## 2022-04-24 ENCOUNTER — Ambulatory Visit (INDEPENDENT_AMBULATORY_CARE_PROVIDER_SITE_OTHER): Payer: Medicare PPO | Admitting: Pharmacist

## 2022-04-24 DIAGNOSIS — Z794 Long term (current) use of insulin: Secondary | ICD-10-CM

## 2022-04-24 DIAGNOSIS — E1169 Type 2 diabetes mellitus with other specified complication: Secondary | ICD-10-CM

## 2022-04-29 NOTE — Telephone Encounter (Signed)
Patient is having issues with her diabetes sensor. She said that it fell off after 3 days and does not know what she needs to do about it. Has appt 3/22 with PCP but wants to know if she needs to come in before then. Please call back and advise.

## 2022-04-29 NOTE — Telephone Encounter (Signed)
Yes, please offer her a replacement

## 2022-04-30 NOTE — Telephone Encounter (Signed)
One sample of Libre 3 sensor left up front for pt. Pt made aware. Advised to call the company if this continues.

## 2022-05-08 ENCOUNTER — Ambulatory Visit (INDEPENDENT_AMBULATORY_CARE_PROVIDER_SITE_OTHER): Payer: Medicare PPO | Admitting: Family Medicine

## 2022-05-08 ENCOUNTER — Encounter: Payer: Self-pay | Admitting: Family Medicine

## 2022-05-08 VITALS — BP 130/82 | HR 84 | Temp 98.6°F | Ht 68.0 in | Wt 176.0 lb

## 2022-05-08 DIAGNOSIS — E1169 Type 2 diabetes mellitus with other specified complication: Secondary | ICD-10-CM

## 2022-05-08 DIAGNOSIS — Z794 Long term (current) use of insulin: Secondary | ICD-10-CM

## 2022-05-08 DIAGNOSIS — R413 Other amnesia: Secondary | ICD-10-CM | POA: Diagnosis not present

## 2022-05-08 NOTE — Progress Notes (Signed)
Subjective: CC: Memory check PCP: Debra Norlander, DO IH:[id number] C Debra Miranda is a 77 y.o. female presenting to clinic today for:  1.  Memory check Patient is here for memory check.  She saw CCM recently and there was concerns about her memory waxing and waning.  There is apparently a very strong family history of dementia.  2.  Type 2 diabetes She is trying to use a freestyle libre but notes that the very first when she had put on fell off and the one that she has attached now does not seem to be functional.   ROS: Per HPI  Allergies  Allergen Reactions   Augmentin [Amoxicillin-Pot Clavulanate] Nausea And Vomiting   Latex Itching   Past Medical History:  Diagnosis Date   Arthritis    right hand   Cataract    bilateral   Depression    many years ago, no meds   Diabetes mellitus without complication ([facility])    type 2   Endometrial adenocarcinoma ([place]) 01/05/2018   treated with HDR   Environmental allergies    Family history of breast cancer    Family history of kidney cancer    Family history of pancreatic cancer    Family history of stomach cancer    Family history of uterine cancer    Hemorrhoids    History of radiation therapy 04/05/18,02/25,3/3,3/10,3/18   04/05/2018, 04/12/2018, 04/19/2018, 04/26/2018, 05/04/2018: Vagina, Iridium HDR, 3 cm cylinder, tx length of 3 cm / 30 Gy in 5 fractions.. Dr. Gery Pray   Incarcerated umbilical hernia    Nodular basal cell carcinoma (BCC) 08/2017   LEFT LOWER LEG above foot   Orthostatic hypotension    Patient is unaware of this diagnosis   Strangulated umbilical hernia     Current Outpatient Medications:    atorvastatin (LIPITOR) 20 MG tablet, TAKE 1 TABLET BY MOUTH EVERY DAY, Disp: 90 tablet, Rfl: 0   Continuous Blood Gluc Receiver (FREESTYLE LIBRE 3 READER) DEVI, 1 each by Does not apply route daily. UAD to check sugar 4 times daily. E11.69, Disp: 1 each, Rfl: 0   Continuous Blood Gluc Sensor (FREESTYLE LIBRE 3 SENSOR)  MISC, UAD to check sugar 4 times daily E11.69, Disp: 6 each, Rfl: 3   dapagliflozin propanediol (FARXIGA) 10 MG TABS tablet, Take 1 tablet (10 mg total) by mouth daily., Disp: 90 tablet, Rfl: 4   escitalopram (LEXAPRO) 10 MG tablet, TAKE 1 TABLET BY MOUTH EVERY DAY, Disp: 90 tablet, Rfl: 0   Insulin Glargine (BASAGLAR KWIKPEN) 100 UNIT/ML, Inject 20 Units into the skin at bedtime. Sample #1pen LOT IT:[id number] AA, EXP 8/21 (Patient taking differently: Inject 16 Units into the skin at bedtime. Sample #1pen LOT IT:[id number] AA, EXP 8/21), Disp: 15 mL, Rfl: 3   Lactobacillus-Inulin ([place]) CAPS, Take 1 capsule daily for digestive health, Disp: 90 capsule, Rfl: 3 Social History   Socioeconomic History   Marital status: Widowed    Spouse name: Not on file   Number of children: 1   Years of education: 14   Highest education level: Associate degree: occupational, Hotel manager, or vocational program  Occupational History   Occupation: Retired    Comment: [employer]  Tobacco Use   Smoking status: Every Day    Types: Cigars   Smokeless tobacco: Never   Tobacco comments:    4 small cigars a day  Vaping Use   Vaping Use: Former   Start date: 02/16/2010   Quit date: 02/17/2011   Devices: unsure  Substance and Sexual Activity   Alcohol use: Not Currently   Drug use: No   Sexual activity: Not Currently  Other Topics Concern   Not on file  Social History Narrative   Her daughter lives with her and helps with bills and ride if she needs   Patient is pretty independent, but sometimes has excessive pain and needs assistance   Social Determinants of Health   Financial Resource Strain: Low Risk  (08/13/2021)   Overall Financial Resource Strain (CARDIA)    Difficulty of Paying Living Expenses: Not hard at all  Food Insecurity: No Food Insecurity (08/13/2021)   Hunger Vital Sign    Worried About Running Out of Food in the Last Year: Never true    [place] in the Last Year: Never  true  Transportation Needs: No Transportation Needs (08/13/2021)   PRAPARE - Hydrologist (Medical): No    Lack of Transportation (Non-Medical): No  Physical Activity: Inactive (08/13/2021)   Exercise Vital Sign    Days of Exercise per Week: 0 days    Minutes of Exercise per Session: 0 min  Stress: No Stress Concern Present (08/13/2021)   [place]    Feeling of Stress : Not at all  Social Connections: Moderately Integrated (08/13/2021)   Social Connection and Isolation Panel [NHANES]    Frequency of Communication with Friends and Family: More than three times a week    Frequency of Social Gatherings with Friends and Family: More than three times a week    Attends Religious Services: More than 4 times per year    Active Member of [employer] or Organizations: Yes    Attends Archivist Meetings: More than 4 times per year    Marital Status: Widowed  Intimate Partner Violence: Not At Risk (08/13/2021)   Humiliation, Afraid, Rape, and Kick questionnaire    Fear of Current or Ex-Partner: No    Emotionally Abused: No    Physically Abused: No    Sexually Abused: No   Family History  Problem Relation Age of Onset   Cervical cancer Mother 15       treated with radiation   Pancreatic cancer Mother 28   Stroke Father    Pulmonary fibrosis Brother    Alzheimer's disease Brother    Stomach cancer Brother        dx 66s   Diabetes Brother    Lung cancer Maternal Grandmother    Stroke Paternal Grandfather    Uterine cancer Maternal Aunt        dx 75s   Kidney cancer Maternal Uncle        dx 38s   Colon cancer Maternal Uncle        dx 32s   Breast cancer Cousin        maternal cousin dx 54    Objective: Office vital signs reviewed. BP 130/82   Pulse 84   Temp 98.6 F (37 C)   Ht 5\' 8"  (1.727 m)   Wt 176 lb (79.8 kg)   SpO2 92%   BMI 26.76 kg/m   Physical Examination:  General:  Awake, alert, well nourished, No acute distress HEENT: sclera white, MMM Cardio: regular rate and rhythm, S1S2 heard, no murmurs appreciated Pulm: clear to auscultation bilaterally, no wheezes, rhonchi or rales; normal work of breathing on room air Neuro: see below    05/08/2022    3:15 PM 07/29/2017  12:06 PM 07/27/2016    9:08 AM  MMSE - Mini Mental State Exam  Orientation to time 5 5 5   Orientation to Place 5 5 5   Registration 3 3 3   Attention/ Calculation 5 5 5   Recall 3 3 3   Language- name 2 objects 2 2 2   Language- repeat 1 1 1   Language- follow 3 step command 3 3 3   Language- read & follow direction 1 1 1   Write a sentence 1 1 1   Copy design 1 1 1   Total score 30 30 30      Assessment/ Plan: 77 y.o. female   Memory change  Type 2 diabetes mellitus with other specified complication, with long-term current use of insulin (HCC)  No evidence of dementia.  Memory changes are likely normal progression of age but nothing that suggest pathologic process at this time  Freestyle Elenor Legato was downloaded and a new 1 applied today by our clinical pharmacist.  Patient may follow-up in June for next A1c  No orders of the defined types were placed in this encounter.  No orders of the defined types were placed in this encounter.    Debra Norlander, DO [place] [phone]

## 2022-05-08 NOTE — Progress Notes (Signed)
Radiation Oncology         [phone] ________________________________  Name: Debra Miranda MRN: [id number]  Date: 05/11/2022  DOB: [birth year]  Follow-Up Visit Note  CC: [name], DO  Ronnie Doss M, DO  No diagnosis found.  Diagnosis:  FIGO grade 2, Stage IA, Endometrial adenocarcinoma (HCC)  Interval Since Last Radiation: 4 years and 1 week  04/05/2018, 04/12/2018, 04/19/2018, 04/26/2018, 05/04/2018: Vagina, Iridium HDR, 3 cm cylinder, tx length of 3 cm / 30 Gy in 5 fractions.   Narrative:  The patient returns today for routine follow-up. She was last seen here for follow up on 05/12/21. Since her last visit, the patient followed up with Dr. Berline Lopes on  10/23/21/ During which time, the patient reported having intermittent diarrhea and mild constipation, both of which have been baseline for her since her intestinal surgery. She otherwise denied any symptoms concerning for disease recurrence and she was noted as NED on examination.   Pertinent imaging performed in the interval includes: a bilateral screening mammogram performed on 01/12/22 which showed no evidence of malignancy in either breast.        ***                         Allergies:  is allergic to augmentin [amoxicillin-pot clavulanate] and latex.  Meds: Current Outpatient Medications  Medication Sig Dispense Refill   atorvastatin (LIPITOR) 20 MG tablet TAKE 1 TABLET BY MOUTH EVERY DAY 90 tablet 0   Continuous Blood Gluc Receiver (FREESTYLE LIBRE 3 READER) DEVI 1 each by Does not apply route daily. UAD to check sugar 4 times daily. E11.69 1 each 0   Continuous Blood Gluc Sensor (FREESTYLE LIBRE 3 SENSOR) MISC UAD to check sugar 4 times daily E11.69 6 each 3   dapagliflozin propanediol (FARXIGA) 10 MG TABS tablet Take 1 tablet (10 mg total) by mouth daily. 90 tablet 4   escitalopram (LEXAPRO) 10 MG tablet TAKE 1 TABLET BY MOUTH EVERY DAY 90 tablet 0   Insulin Glargine (BASAGLAR KWIKPEN) 100 UNIT/ML Inject  20 Units into the skin at bedtime. Sample #1pen LOT IT:4040199 AA, EXP 8/21 (Patient taking differently: Inject 16 Units into the skin at bedtime. Sample #1pen LOT IT:[id number] AA, EXP 8/21) 15 mL 3   Lactobacillus-Inulin ([place]) CAPS Take 1 capsule daily for digestive health 90 capsule 3   No current facility-administered medications for this encounter.    Physical Findings: The patient is in no acute distress. Patient is alert and oriented.  vitals were not taken for this visit. .  No significant changes. Lungs are clear to auscultation bilaterally. Heart has regular rate and rhythm. No palpable cervical, supraclavicular, or axillary adenopathy. Abdomen soft, non-tender, normal bowel sounds.  On pelvic examination the external genitalia were unremarkable. A speculum exam was performed. There are no mucosal lesions noted in the vaginal vault. A Pap smear was obtained of the proximal vagina. On bimanual and rectovaginal examination there were no pelvic masses appreciated. ***   Lab Findings: Lab Results  Component Value Date   WBC 7.6 10/16/2020   HGB 16.7 (H) 10/16/2020   HCT 47.9 (H) 10/16/2020   MCV 89 10/16/2020   PLT 159 10/16/2020    Radiographic Findings: No results found.  Impression: FIGO grade 2, Stage IA, Endometrial adenocarcinoma ([place])  The patient is recovering from the effects of radiation.  ***  Plan:  ***   *** minutes of total time was spent for this  patient encounter, including preparation, face-to-face counseling with the patient and coordination of care, physical exam, and documentation of the encounter. ____________________________________  Blair Promise, PhD, MD  This document serves as a record of services personally performed by Gery Pray, MD. It was created on his behalf by Roney Mans, a trained medical scribe. The creation of this record is based on the scribe's personal observations and the provider's statements to them. This  document has been checked and approved by the attending provider.

## 2022-05-11 ENCOUNTER — Encounter: Payer: Self-pay | Admitting: Radiation Oncology

## 2022-05-11 ENCOUNTER — Ambulatory Visit
Admission: RE | Admit: 2022-05-11 | Discharge: 2022-05-11 | Disposition: A | Discharge status: Home / Self Care | Payer: Medicare PPO | Source: Ambulatory Visit | Attending: Radiation Oncology | Admitting: Radiation Oncology

## 2022-05-11 VITALS — BP 154/78 | HR 87 | Temp 97.8°F | Resp 20 | Ht 67.0 in | Wt 175.8 lb

## 2022-05-11 DIAGNOSIS — C541 Malignant neoplasm of endometrium: Secondary | ICD-10-CM | POA: Diagnosis not present

## 2022-05-11 DIAGNOSIS — Z7984 Long term (current) use of oral hypoglycemic drugs: Secondary | ICD-10-CM | POA: Insufficient documentation

## 2022-05-11 DIAGNOSIS — Z79899 Other long term (current) drug therapy: Secondary | ICD-10-CM | POA: Diagnosis not present

## 2022-05-11 DIAGNOSIS — Z923 Personal history of irradiation: Secondary | ICD-10-CM | POA: Insufficient documentation

## 2022-05-11 DIAGNOSIS — Z8542 Personal history of malignant neoplasm of other parts of uterus: Secondary | ICD-10-CM | POA: Diagnosis not present

## 2022-05-11 NOTE — Progress Notes (Addendum)
Debra Miranda is here today for follow up post radiation to the pelvic.  They completed their radiation on: 05/04/18   Does the patient complain of any of the following:  Pain: Reports back pain since fall in December.  Abdominal bloating: No Diarrhea/Constipation: Yes, both at times.  Nausea/Vomiting: No Vaginal Discharge: No Blood in Urine or Stool: Yes, blood at times when  she self caths. Urinary Issues (dysuria/incomplete emptying/ incontinence/ increased frequency/urgency): No Does patient report using vaginal dilator 2-3 times a week and/or sexually active 2-3 weeks: No Post radiation skin changes: Reports rash to bilateral groin.    Additional comments if applicable:   BP (!) Q000111Q (BP Location: Right Arm, Patient Position: Sitting, Cuff Size: Normal)   Pulse 87   Temp 97.8 F (36.6 C)   Resp 20   Ht 5\' 7"  (1.702 m)   Wt 175 lb 12.8 oz (79.7 kg)   SpO2 97%   BMI 27.53 kg/m

## 2022-05-13 ENCOUNTER — Telehealth: Payer: Self-pay | Admitting: Pharmacist

## 2022-05-13 NOTE — Telephone Encounter (Signed)
    05/13/2022 Name: MIREILLE OESTERREICH MRN: [id number] DOB: 08-12-1945   Elenor Legato 2 CGM training & education provided.  Patient is using [employer] 2 reader (cell phone not compatible).  Patient able to demonstrate teach back.  Will f/u in 2 weeks for libre change and interpretation.     Regina Eck, PharmD, [place] Clinical Pharmacist, [facility]

## 2022-05-20 NOTE — Progress Notes (Signed)
      05/13/2022 Name: Debra Miranda            MRN: [id number]       DOB: Mar 12, 1945     Patient set up with Elenor Legato 2 CGM.  Loaner reader device was given as her cell phone is not compatible.  Patient was able to demonstrate use.  Her blood sugars have bene labile and CGM is preferred as we are titrating insulin.  Patient to f/u with PharmD in 2 weeks for CGM interpretation.    Regina Eck, PharmD, [place] Clinical Pharmacist, [facility]

## 2022-05-22 ENCOUNTER — Ambulatory Visit: Payer: Medicare PPO

## 2022-05-26 ENCOUNTER — Telehealth: Payer: Self-pay | Admitting: Family Medicine

## 2022-05-26 NOTE — Telephone Encounter (Signed)
Anything 4/19? That's when her sensor will expire. You can use a 

## 2022-06-05 ENCOUNTER — Ambulatory Visit: Payer: Medicare PPO

## 2022-06-05 ENCOUNTER — Ambulatory Visit: Payer: Medicare PPO | Admitting: Pharmacist

## 2022-06-08 ENCOUNTER — Telehealth: Payer: Self-pay | Admitting: Family Medicine

## 2022-06-09 ENCOUNTER — Telehealth: Payer: Self-pay

## 2022-06-09 NOTE — Progress Notes (Unsigned)
  Care Coordination Note  06/09/2022 Name: Debra Miranda MRN: [id number] DOB: 02/02/1946  Debra Miranda is a 77 y.o. year old female who is a primary care patient of Raliegh Ip, DO and is actively engaged with the Chronic Care Management team. I reached out to Drenda Freeze by phone today to assist with scheduling a follow up visit with the Pharmacist  Follow up plan: Unsuccessful telephone outreach attempt made. A HIPAA compliant phone message was left for the patient providing contact information and requesting a return call.  The care management team will reach out to the patient again over the next 7 days.  If patient returns call to provider office, please advise to call CCM Care Guide Debra Miranda  at [phone]  Debra Miranda, RMA Care Guide [facility]  [place], [place] [address] Direct Dial: [phone] [email or web address]

## 2022-06-12 NOTE — Progress Notes (Signed)
      06/05/2022 Name: Debra Miranda            MRN: [id number]       DOB: 10-Sep-1945     [name] 2 CGM training & education provided.  Patient is using [employer] 2 reader (cell phone not compatible).  Patient able to demonstrate teach back.  She has chosen to wear CGM on her stomach since her arms are thin and she has not been able to keep one attached.  Blood sugars remain at goal and no evidence of hypoglycemia.  Will f/u in 2 weeks for libre change and interpretation.     Kieth Brightly, PharmD, BCACP Clinical Pharmacist, [facility]

## 2022-06-19 ENCOUNTER — Ambulatory Visit (INDEPENDENT_AMBULATORY_CARE_PROVIDER_SITE_OTHER): Payer: Medicare PPO | Admitting: Pharmacist

## 2022-06-19 ENCOUNTER — Telehealth: Payer: Self-pay | Admitting: Pharmacist

## 2022-06-19 DIAGNOSIS — Z794 Long term (current) use of insulin: Secondary | ICD-10-CM

## 2022-06-19 DIAGNOSIS — E1169 Type 2 diabetes mellitus with other specified complication: Secondary | ICD-10-CM | POA: Diagnosis not present

## 2022-06-19 MED ORDER — FREESTYLE LIBRE 3 READER DEVI: 1.0000 | Freq: Every day | 0 refills | Status: DC

## 2022-06-19 MED ORDER — FREESTYLE LIBRE 3 SENSOR MISC: 3 refills | Status: DC

## 2022-06-19 NOTE — Telephone Encounter (Signed)
PA submitted Awaiting humana

## 2022-06-19 NOTE — Progress Notes (Signed)
06/19/2022 Name: Debra Miranda MRN: [id number] DOB: Mar 30, 1945  Chief Complaint  Patient presents with   Diabetes    CATHELINE Miranda is a 77 y.o. year old female who was referred for medication management by their primary care provider, Delynn Flavin M, DO. They presented for a face to face visit today.   They were referred to the pharmacist by their PCP for assistance in managing diabetes   Subjective:  Care Team: Primary Care Provider: Raliegh Ip, DO ; Next Scheduled Visit: 07/20/22  Medication Access/Adherence  [facility]acy[facility]  [phone] [place],  - [address] [place] [address] Phone: [phone] Fax: [phone]  [facility] [place], [place] - [address] [place] [address] Phone: [phone] Fax: [phone]  [facility] - [place], [place] - [address] [place] [address] Phone: [phone] Fax: [phone]   Patient reports affordability concerns with their medications: Yes  Patient reports access/transportation concerns to their pharmacy: No  Patient reports adherence concerns with their medications:  No     Diabetes:  Current medications: insulin glargine (Basaglar), dapagliflozin Medications tried in the past: empagliflozin, sitagliptin  Current glucose readings: see CGM report below Using Libre 2 CGM    -Patient is insulin 1 time daily -She greatly benefits from a continuous glucose monitoring system   Patient denies hypoglycemic s/sx including dizziness, shakiness, sweating. Patient denies hyperglycemic symptoms including polyuria, polydipsia, polyphagia, nocturia, neuropathy, blurred vision.  Current physical activity: limited  Current medication access support: - [place] for dapagliflozin [name]) and Lilly cares PAP for insulin glargine (Basaglar) until 2025.   Objective:  Lab Results  Component  Value Date   HGBA1C 8.9 (H) 04/17/2022    Lab Results  Component Value Date   CREATININE 0.75 01/16/2022   BUN 13 01/16/2022   NA 140 01/16/2022   K 4.4 01/16/2022   CL 101 01/16/2022   CO2 24 01/16/2022    Lab Results  Component Value Date   CHOL 124 08/27/2017   HDL 53 08/27/2017   LDLCALC 46 08/27/2017   LDLDIRECT 74 10/16/2020   TRIG 126 08/27/2017   CHOLHDL 2.3 08/27/2017    Medications Reviewed Today     Reviewed by Danella Maiers, [facility] (Pharmacist) on 06/19/22 at 1101  Med List Status: <None>   Medication Order Taking? Sig Documenting Provider Last Dose Status Informant  atorvastatin (LIPITOR) 20 MG tablet [id number] No TAKE 1 TABLET BY MOUTH EVERY DAY Gottschalk, Ashly M, DO Taking Active   Continuous Blood Gluc Receiver (FREESTYLE LIBRE 3 READER) DEVI [id number] No 1 each by Does not apply route daily. UAD to check sugar 4 times daily. E11.69 Raliegh Ip, DO Taking Active   Continuous Blood Gluc Sensor (FREESTYLE LIBRE 3 SENSOR) [place] [id number] No UAD to check sugar 4 times daily E11.69 Delynn Flavin M, DO Taking Active   dapagliflozin propanediol (FARXIGA) 10 MG TABS tablet [id number] No Take 1 tablet (10 mg total) by mouth daily. Raliegh Ip, DO Taking Active            Med Note Karna Dupes Mar 24, 2021 10:09 AM) Via [place] patient [place]; escribe to [facility] for refills   escitalopram (LEXAPRO) 10 MG tablet [id number] No TAKE 1 TABLET BY MOUTH EVERY DAY Raliegh Ip, DO Taking Active   Insulin Glargine (BASAGLAR KWIKPEN) 100 UNIT/ML [id number] No  Inject 20 Units into the skin at bedtime. Sample #1pen LOT [id number] AA, EXP 8/21  Patient taking differently: Inject 16 Units into the skin at bedtime. Sample #1pen LOT [id number] AA, EXP 8/21   Delynn Flavin M, DO Taking Active            Med Note Donn Pierini Jan 30, 2021  1:05 PM) VIA [employer]ILLY CARES PATIENT ASSISTANCE PROGRAM  Lactobacillus-Inulin (CULTURELLE DIGESTIVE  HEALTH) CAPS [id number] No Take 1 capsule daily for digestive health Raliegh Ip, DO Taking Active            Med Note Abbe Amsterdam, AMY E   Wed Aug 13, 2021  3:38 PM) Now using Align              Assessment/Plan:   Diabetes: - Currently controlled  Current time in target range 89%  No hypo events  AVG glucose 131, GMI 6.4%  High post prandials after some lunch and dinner (patient ate out and was able to account for higher sugars/post prandials) - Reviewed long term cardiovascular and renal outcomes of uncontrolled blood sugar - Reviewed goal A1c, goal fasting, and goal 2 hour post prandial glucose - Reviewed dietary modifications including healthy plate method - Recommend to continue current regimen of glargine 20 units daily, farxiga 10mg  daily  - Recommend to check glucose using libre 2; new sensor placed - Recommend libre 3 for continuous monitoring (no scanning) [facility] - Reviewed med compliance - [place] for dapagliflozin [name]) and Lilly cares PAP for insulin glargine (Basaglar) until 2025.    Follow Up Plan: pharmD 2 weeks  Kieth Brightly, PharmD, BCACP Clinical Pharmacist, [facility]

## 2022-07-03 ENCOUNTER — Ambulatory Visit: Payer: Medicare PPO

## 2022-07-03 ENCOUNTER — Telehealth: Payer: Self-pay

## 2022-07-03 DIAGNOSIS — Z794 Long term (current) use of insulin: Secondary | ICD-10-CM

## 2022-07-03 NOTE — Telephone Encounter (Signed)
Vivianne Master ([facility]) Rx #: [id number] Need Help? Call us at [phone] Outcome Additional Information Required Authorization already on file for this request. Authorization starting on 02/16/2022 and ending on 02/16/2023. Drug FreeStyle Libre 3 Sensor ePA cloud logo Form [employer] Electronic PA Form  Pharmacy and patient aware

## 2022-07-03 NOTE — Progress Notes (Signed)
Libre sensor applied to left lower abdomen.  Patient tolerated well.

## 2022-07-12 ENCOUNTER — Other Ambulatory Visit: Payer: Self-pay | Admitting: Family Medicine

## 2022-07-13 ENCOUNTER — Other Ambulatory Visit: Payer: Self-pay | Admitting: Family Medicine

## 2022-07-13 DIAGNOSIS — E1169 Type 2 diabetes mellitus with other specified complication: Secondary | ICD-10-CM

## 2022-07-20 ENCOUNTER — Ambulatory Visit: Payer: Medicare PPO | Admitting: Family Medicine

## 2022-08-17 ENCOUNTER — Ambulatory Visit (INDEPENDENT_AMBULATORY_CARE_PROVIDER_SITE_OTHER): Payer: Medicare PPO

## 2022-08-17 VITALS — Ht 67.0 in | Wt 174.0 lb

## 2022-08-17 DIAGNOSIS — Z780 Asymptomatic menopausal state: Secondary | ICD-10-CM

## 2022-08-17 DIAGNOSIS — Z0000 Encounter for general adult medical examination without abnormal findings: Secondary | ICD-10-CM

## 2022-08-17 NOTE — Progress Notes (Signed)
Subjective:   Debra Miranda is a 77 y.o. female who presents for Medicare Annual (Subsequent) preventive examination.  Visit Complete: Virtual  I connected with  Debra Miranda on 08/17/22 by a audio enabled telemedicine application and verified that I am speaking with the correct person using two identifiers.  Patient Location: Home  Provider Location: Home Office  I discussed the limitations of evaluation and management by telemedicine. The patient expressed understanding and agreed to proceed.  Patient Medicare AWV questionnaire was completed by the patient on 08/17/2022; I have confirmed that all information answered by patient is correct and no changes since this date.  Review of Systems    Nutrition Risk Assessment:  Has the patient had any N/V/D within the last 2 months?  No  Does the patient have any non-healing wounds?  No  Has the patient had any unintentional weight loss or weight gain?  No   Diabetes:  Is the patient diabetic?  Yes  If diabetic, was a CBG obtained today?  No  Did the patient bring in their glucometer from home?  No  How often do you monitor your CBG's? Libre .   Financial Strains and Diabetes Management:  Are you having any financial strains with the device, your supplies or your medication? No .  Does the patient want to be seen by Chronic Care Management for management of their diabetes?  No  Would the patient like to be referred to a Nutritionist or for Diabetic Management?  No   Diabetic Exams:  Diabetic Eye Exam: Completed 11/2021 Diabetic Foot Exam: Overdue, Pt has been advised about the importance in completing this exam. Pt is scheduled for diabetic foot exam on next office visit .  Cardiac Risk Factors include: advanced age (>30men, >16 women);diabetes mellitus;dyslipidemia;hypertension;smoking/ tobacco exposure     Objective:    Today's Vitals   08/17/22 1322  Weight: 174 lb (78.9 kg)  Height: 5\' 7"  (1.702 m)   Body mass  index is 27.25 kg/m.     08/17/2022    1:26 PM 05/11/2022    2:55 PM 08/13/2021    3:44 PM 05/12/2021    3:45 PM 05/13/2020    3:16 PM 11/13/2019    4:11 PM 06/01/2019    4:26 PM  Advanced Directives  Does Patient Have a Medical Advance Directive? Yes Yes Yes Yes Yes Yes No  Type of Estate agent of [place];Living will  Healthcare Power of [place];Living will Healthcare Power of [place];Living will Living will Living will   Does patient want to make changes to medical advance directive? No - Patient declined No - Patient declined  No - Patient declined No - Patient declined Yes (ED - Information included in AVS)   Copy of Healthcare Power of Attorney in Chart? Yes - validated most recent copy scanned in chart (See row information)  Yes - validated most recent copy scanned in chart (See row information) Yes - validated most recent copy scanned in chart (See row information)     Would patient like information on creating a medical advance directive?       No - Patient declined    Current Medications (verified) Outpatient Encounter Medications as of 08/17/2022  Medication Sig   atorvastatin (LIPITOR) 20 MG tablet TAKE 1 TABLET BY MOUTH EVERY DAY   Continuous Glucose Receiver (FREESTYLE LIBRE 3 READER) DEVI 1 each by Does not apply route daily. Use to test sugar continuously DX: E11.69   Continuous Glucose Sensor (  FREESTYLE LIBRE 3 SENSOR) MISC Apply to arm every 14 days, Use to test blood sugar continuously. DX:E11.69   dapagliflozin propanediol (FARXIGA) 10 MG TABS tablet Take 1 tablet (10 mg total) by mouth daily.   escitalopram (LEXAPRO) 10 MG tablet TAKE 1 TABLET BY MOUTH EVERY DAY   Insulin Glargine (BASAGLAR KWIKPEN) 100 UNIT/ML Inject 20 Units into the skin at bedtime. Sample #1pen LOT [id number], EXP 8/21 (Patient taking differently: Inject 16 Units into the skin at bedtime. Sample #1pen LOT [id number], EXP 8/21)   Lactobacillus-Inulin (CULTURELLE DIGESTIVE HEALTH)  CAPS Take 1 capsule daily for digestive health   No facility-administered encounter medications on file as of 08/17/2022.    Allergies (verified) Augmentin [amoxicillin-pot clavulanate] and Latex   History: Past Medical History:  Diagnosis Date   Arthritis    right hand   Cataract    bilateral   Depression    many years ago, no meds   Diabetes mellitus without complication (HCC)    type 2   Endometrial adenocarcinoma (HCC) 01/05/2018   treated with HDR   Environmental allergies    Family history of breast cancer    Family history of kidney cancer    Family history of pancreatic cancer    Family history of stomach cancer    Family history of uterine cancer    Hemorrhoids    History of radiation therapy 04/05/18,02/25,3/3,3/10,3/18   04/05/2018, 04/12/2018, 04/19/2018, 04/26/2018, 05/04/2018: Vagina, Iridium HDR, 3 cm cylinder, tx length of 3 cm / 30 Gy in 5 fractions.. Dr. Antony Blackbird   Incarcerated umbilical hernia    Nodular basal cell carcinoma (BCC) 08/2017   LEFT LOWER LEG above foot   Orthostatic hypotension    Patient is unaware of this diagnosis   Strangulated umbilical hernia    Past Surgical History:  Procedure Laterality Date   ABDOMINAL HYSTERECTOMY     Dr. Doroteo Glassman 02-08-18    BOWEL RESECTION N/A 06/01/2019   Procedure: SMALL BOWEL RESECTION;  Surgeon: Lucretia Roers, MD;  Location: [facility];  Service: General;  Laterality: N/A;   CATARACT EXTRACTION     RIGHT EYE   HYSTEROSCOPY WITH D & C N/A 01/05/2018   Procedure: DILATATION AND CURETTAGE/HYSTEROSCOPY;  Surgeon: Lazaro Arms, MD;  Location: [facility];  Service: Gynecology;  Laterality: N/A;   LUMBAR DISC SURGERY  2006   OMENTECTOMY N/A 06/01/2019   Procedure: OMENTECTOMY;  Surgeon: Lucretia Roers, MD;  Location: [facility];  Service: General;  Laterality: N/A;   POLYPECTOMY N/A 01/05/2018   Procedure: ENDOMETRIAL POLYPECTOMY;  Surgeon: Lazaro Arms, MD;  Location: [facility];  Service: Gynecology;   Laterality: N/A;   ROBOTIC ASSISTED TOTAL HYSTERECTOMY WITH BILATERAL SALPINGO OOPHERECTOMY Bilateral 02/08/2018   Procedure: XI ROBOTIC ASSISTED TOTAL HYSTERECTOMY WITH BILATERAL SALPINGO OOPHORECTOMY;  Surgeon: Shonna Chock, MD;  Location: [facility];  Service: Gynecology;  Laterality: Bilateral;   SENTINEL NODE BIOPSY Bilateral 02/08/2018   Procedure: SENTINEL NODE BIOPSY AND PELVIC LYMPH NODE SAMPLING;  Surgeon: Shonna Chock, MD;  Location: [facility];  Service: Gynecology;  Laterality: Bilateral;   TONSILLECTOMY     as a child   UMBILICAL HERNIA REPAIR N/A 06/01/2019   Procedure: HERNIA REPAIR UMBILICAL ADULT;  Surgeon: Lucretia Roers, MD;  Location: [facility];  Service: General;  Laterality: N/A;   Family History  Problem Relation Age of Onset   Cervical cancer Mother 29       treated with radiation   Pancreatic cancer Mother 73  Stroke Father    Pulmonary fibrosis Brother    Alzheimer's disease Brother    Stomach cancer Brother        dx 36s   Diabetes Brother    Lung cancer Maternal Grandmother    Stroke Paternal Grandfather    Uterine cancer Maternal Aunt        dx 20s   Kidney cancer Maternal Uncle        dx 31s   Colon cancer Maternal Uncle        dx 50s   Breast cancer Cousin        maternal cousin dx 72   Social History   Socioeconomic History   Marital status: Widowed    Spouse name: Not on file   Number of children: 1   Years of education: 14   Highest education level: Associate degree: occupational, Scientist, product/process development, or vocational program  Occupational History   Occupation: Retired    Comment: [employer]  Tobacco Use   Smoking status: Every Day    Types: Cigars   Smokeless tobacco: Never   Tobacco comments:    4 small cigars a day  Vaping Use   Vaping Use: Former   Start date: 02/16/2010   Quit date: 02/17/2011   Devices: unsure  Substance and Sexual Activity   Alcohol use: Not Currently   Drug use: No   Sexual activity: Not Currently  Other  Topics Concern   Not on file  Social History Narrative   Her daughter lives with her and helps with bills and ride if she needs   Patient is pretty independent, but sometimes has excessive pain and needs assistance   Social Determinants of Health   Financial Resource Strain: Low Risk  (08/17/2022)   Overall Financial Resource Strain (CARDIA)    Difficulty of Paying Living Expenses: Not hard at all  Food Insecurity: No Food Insecurity (08/13/2021)   Hunger Vital Sign    Worried About Running Out of Food in the Last Year: Never true    Ran Out of Food in the Last Year: Never true  Transportation Needs: No Transportation Needs (08/13/2021)   PRAPARE - Administrator, Civil Service (Medical): No    Lack of Transportation (Non-Medical): No  Physical Activity: Insufficiently Active (08/17/2022)   Exercise Vital Sign    Days of Exercise per Week: 2 days    Minutes of Exercise per Session: 30 min  Stress: No Stress Concern Present (08/17/2022)   [employer] of Occupational Health - Occupational Stress Questionnaire    Feeling of Stress : Not at all  Social Connections: Moderately Isolated (08/17/2022)   Social Connection and Isolation Panel [NHANES]    Frequency of Communication with Friends and Family: More than three times a week    Frequency of Social Gatherings with Friends and Family: More than three times a week    Attends Religious Services: 1 to 4 times per year    Active Member of [employer] or Organizations: No    Attends Banker Meetings: Never    Marital Status: Widowed    Tobacco Counseling Ready to quit: No Counseling given: Not Answered Tobacco comments: 4 small cigars a day   Clinical Intake:  Pre-visit preparation completed: Yes  Pain : No/denies pain     Nutritional Risks: None Diabetes: Yes CBG done?: No Did pt. bring in CBG monitor from home?: No  How often do you need to have someone help you when you read instructions,  pamphlets, or  other written materials from your doctor or pharmacy?: 1 - Never  Interpreter Needed?: No  Information entered by :: Renie Ora, LPN   Activities of Daily Living    08/17/2022    1:26 PM  In your present state of health, do you have any difficulty performing the following activities:  Hearing? 0  Vision? 0  Difficulty concentrating or making decisions? 0  Walking or climbing stairs? 0  Dressing or bathing? 0  Doing errands, shopping? 0  Preparing Food and eating ? N  Using the Toilet? N  In the past six months, have you accidently leaked urine? N  Do you have problems with loss of bowel control? N  Managing your Medications? N  Managing your Finances? N  Housekeeping or managing your Housekeeping? N    Patient Care Team: Raliegh Ip, DO as PCP - General (Family Medicine) Danella Maiers, [facility] (Pharmacist) Noel Christmas, MD as Consulting Physician (Urology) Sinda Du, MD as Consulting Physician (Ophthalmology)  Indicate any recent Medical Services you may have received from other than [facility] providers in the past year (date may be approximate).     Assessment:   This is a routine wellness examination for [facility].  Hearing/Vision screen Vision Screening - Comments:: Wears rx glasses - up to date with routine eye exams with  Dr.Bowman    Dietary issues and exercise activities discussed:     Goals Addressed             This Visit's Progress    Cut out extra servings         Depression Screen    08/17/2022    1:25 PM 05/08/2022    2:57 PM 04/17/2022    2:34 PM 01/16/2022    2:08 PM 09/15/2021    2:22 PM 08/13/2021    3:50 PM 05/09/2021    9:50 AM  PHQ 2/9 Scores  PHQ - 2 Score 0 0 0 0 0 0 0  PHQ- 9 Score  2 0  0  2    Fall Risk    08/17/2022    1:23 PM 04/17/2022    2:46 PM 04/17/2022    2:34 PM 01/16/2022    2:08 PM 09/15/2021    2:22 PM  Fall Risk   Falls in the past year? 1 1 0 0 1  Number falls in past yr: 1 0 0  0  Injury with Fall? 1 1 0   1  Risk for fall due to : History of fall(s);Impaired balance/gait;Orthopedic patient Impaired balance/gait No Fall Risks    Follow up Education provided;Falls prevention discussed;Falls evaluation completed Education provided Education provided      MEDICARE RISK AT HOME:  Medicare Risk at Home - 08/17/22 1323     Any stairs in or around the home? Yes    If so, are there any without handrails? No    Home free of loose throw rugs in walkways, pet beds, electrical cords, etc? Yes    Adequate lighting in your home to reduce risk of falls? Yes    Life alert? No    Use of a cane, walker or w/c? No    Grab bars in the bathroom? Yes    Shower chair or bench in shower? Yes    Elevated toilet seat or a handicapped toilet? Yes             TIMED UP AND GO:  Was the test performed?  No  Cognitive Function:    05/08/2022    3:15 PM 07/29/2017   12:06 PM 07/27/2016    9:08 AM  MMSE - Mini Mental State Exam  Orientation to time 5 5 5   Orientation to Place 5 5 5   Registration 3 3 3   Attention/ Calculation 5 5 5   Recall 3 3 3   Language- name 2 objects 2 2 2   Language- repeat 1 1 1   Language- follow 3 step command 3 3 3   Language- read & follow direction 1 1 1   Write a sentence 1 1 1   Copy design 1 1 1   Total score 30 30 30         08/17/2022    1:26 PM 08/13/2021    3:44 PM 11/17/2018    2:01 PM  6CIT Screen  What Year? 0 points 0 points 0 points  What month? 0 points 0 points 0 points  What time? 0 points 0 points 0 points  Count back from 20 0 points 0 points 0 points  Months in reverse 0 points 0 points 0 points  Repeat phrase 0 points 2 points 0 points  Total Score 0 points 2 points 0 points    Immunizations Immunization History  Administered Date(s) Administered   COVID-19, mRNA, vaccine(Comirnaty)12 years and older 11/25/2021   Fluad Quad(high Dose 65+) 11/15/2018, 11/22/2019   Influenza, High Dose Seasonal PF 11/29/2015, 11/18/2016, 12/06/2017    Influenza,inj,Quad PF,6+ Mos 12/07/2013, 11/12/2014   Influenza-Unspecified 11/20/2020   Moderna Covid Bivalent Peds Booster(69mo Thru 15yrs) 07/08/2021   PFIZER(Purple Top)SARS-COV-2 Vaccination 04/16/2019, 05/16/2019, 11/13/2019, 05/23/2020, 11/20/2020   Pneumococcal Conjugate-13 03/17/2013   Pneumococcal Polysaccharide-23 07/29/2017, 02/09/2018   Respiratory Syncytial Virus Vaccine,Recomb Aduvanted(Arexvy) 12/10/2021   Zoster Recombinant(Shingrix) 03/18/2021, 09/15/2021    TDAP status: Due, Education has been provided regarding the importance of this vaccine. Advised may receive this vaccine at local pharmacy or Health Dept. Aware to provide a copy of the vaccination record if obtained from local pharmacy or Health Dept. Verbalized acceptance and understanding.  Flu Vaccine status: Up to date  Pneumococcal vaccine status: Up to date  Covid-19 vaccine status: Completed vaccines  Qualifies for Shingles Vaccine? Yes   Zostavax completed Yes   Shingrix Completed?: Yes  Screening Tests Health Maintenance  Topic Date Due   COVID-19 Vaccine (8 - 2023-24 season) 01/20/2022   FOOT EXAM  03/18/2022   OPHTHALMOLOGY EXAM  04/18/2022   DEXA SCAN  09/16/2022 (Originally 04/02/2021)   INFLUENZA VACCINE  09/17/2022   HEMOGLOBIN A1C  10/18/2022   Diabetic kidney evaluation - eGFR measurement  01/17/2023   Diabetic kidney evaluation - Urine ACR  01/17/2023   Fecal DNA (Cologuard)  02/06/2023   Medicare Annual Wellness (AWV)  08/17/2023   Pneumonia Vaccine 58+ Years old  Completed   Hepatitis C Screening  Completed   Zoster Vaccines- Shingrix  Completed   HPV VACCINES  Aged Out   DTaP/Tdap/Td  Discontinued    Health Maintenance  Health Maintenance Due  Topic Date Due   COVID-19 Vaccine (8 - 2023-24 season) 01/20/2022   FOOT EXAM  03/18/2022   OPHTHALMOLOGY EXAM  04/18/2022    Colorectal cancer screening: No longer required.   Mammogram status: No longer required due to age .  Bone  Density status: Ordered 08/17/2022. Pt provided with contact info and advised to call to schedule appt.  Lung Cancer Screening: (Low Dose CT Chest recommended if Age 66-80 years, 20 pack-year currently smoking OR have quit w/in 15years.) does qualify.  Lung Cancer Screening Referral: declines   Additional Screening:  Hepatitis C Screening: does not qualify; Completed 12/06/2017  Vision Screening: Recommended annual ophthalmology exams for early detection of glaucoma and other disorders of the eye. Is the patient up to date with their annual eye exam?  Yes  Who is the provider or what is the name of the office in which the patient attends annual eye exams? Dr.Bowman  If pt is not established with a provider, would they like to be referred to a provider to establish care? No .   Dental Screening: Recommended annual dental exams for proper oral hygiene  Diabetic Foot Exam: Diabetic Foot Exam: Overdue, Pt has been advised about the importance in completing this exam. Pt is scheduled for diabetic foot exam on next office visit .  Community Resource Referral / Chronic Care Management: CRR required this visit?  No   CCM required this visit?  No     Plan:     I have personally reviewed and noted the following in the patient's chart:   Medical and social history Use of alcohol, tobacco or illicit drugs  Current medications and supplements including opioid prescriptions. Patient is not currently taking opioid prescriptions. Functional ability and status Nutritional status Physical activity Advanced directives List of other physicians Hospitalizations, surgeries, and ER visits in previous 12 months Vitals Screenings to include cognitive, depression, and falls Referrals and appointments  In addition, I have reviewed and discussed with patient certain preventive protocols, quality metrics, and best practice recommendations. A written personalized care plan for preventive services as  well as general preventive health recommendations were provided to patient.     Lorrene Reid, LPN   02/16/9145   After Visit Summary: (MyChart) Due to this being a telephonic visit, the after visit summary with patients personalized plan was offered to patient via MyChart   Nurse Notes: Due TDAp Vaccine

## 2022-08-17 NOTE — Patient Instructions (Signed)
Debra Miranda , Thank you for taking time to come for your Medicare Wellness Visit. I appreciate your ongoing commitment to your health goals. Please review the following plan we discussed and let me know if I can assist you in the future.   These are the goals we discussed:  Goals       Cut out extra servings      Exercise 150 minutes per week (moderate activity)      Try to increase physical activity to 30 minutes daily      T2DM, HLD PHARMD GOAL (pt-stated)      Current Barriers:  Unable to independently afford treatment regimen Unable to maintain control of T2DM--NEED PRESCRIPTION ASSISTANCE Suboptimal therapeutic regimen for T2DM  Pharmacist Clinical Goal(s):  patient will verbalize ability to afford treatment regimen maintain control of T2DM, HLD as evidenced by GOAL A1C<7% AND GOAL LDL<70  adhere to plan to optimize therapeutic regimen for T2DM as evidenced by report of adherence to recommended medication management changes through collaboration with PharmD and provider.   Interventions: 1:1 collaboration with Raliegh Ip, DO regarding development and update of comprehensive plan of care as evidenced by provider attestation and co-signature Inter-disciplinary care team collaboration (see longitudinal plan of care) Comprehensive medication review performed; medication list updated in electronic medical record  Diabetes: Goal on Track (progressing): YES. Controlled- slight increase 7.1%--> 7.7%; GFR 91 CURRENT treatment:  BASAGLAR 16-20 units, FARXIGA 10MG  daily (continue current regimen for now)  Previous treatments: JANUVIA WAS DISCONTINUED at last PCP visit in 2022 (may have to add back; onglyza would be easy to obtain if we can't do GLP1-GI history approx 1 yr ago; will address) 2023 PATIENT ASSISTANCE COMPLETED FOR AZ&ME (FARXIGA) AND LILLY CARES (BASAGLAR) BOTH WILL SHIP TO PATIENT'S HOME Current glucose readings: fasting glucose: <130, post prandial glucose:  <180 Hasn't been able to check; I requested patient bring her glucometer to me and I will assist with set up and education Attempting to get CGM --> Rx sent for Libre 3 CGM system via [facility] (via [facility] portal) Denies hypoglycemic/hyperglycemic symptoms Discussed meal planning options and Plate method for healthy eating Avoid sugary drinks and desserts Incorporate balanced protein, non starchy veggies, 1 serving of carbohydrate with each meal Increase water intake Increase physical activity as able Current exercise: N/A--DIFFICULTY WALKING Educated on MEDICATIONS (PURPOSE AND SIDE EFFECTS); needs to check blood sugar Recommended BASAGLAR AND FARXIGA (AVOIDING GLP1 GIVEN GI HISTORY); may consider DPP4 if needed (would like to reduce insulin) Assessed patient finances. PATIENT ASSISTANCE active through 02/15/22 FOR AZ&ME (FARXIGA) AND LILLY CARES (BASAGLAR)  Hyperlipidemia:  Goal on Track (progressing): YES. Uncontrolled-LDL 74, almost at goal; current treatment:ATORVASTATIN; lipids due in august 2023 Medications previously tried: N/A  Current dietary patterns: RECOMMENDED HEART HEALTHY/HEALTHY PLATE METHOD Lipid Panel     Component Value Date/Time   CHOL 124 08/27/2017 1314   TRIG 126 08/27/2017 1314   TRIG 108 05/09/2014 1403   HDL 53 08/27/2017 1314   HDL 51 05/09/2014 1403   CHOLHDL 2.3 08/27/2017 1314   LDLCALC 46 08/27/2017 1314   LDLDIRECT 74 10/16/2020 1509   LABVLDL 25 08/27/2017 1314   Patient Goals/Self-Care Activities patient will:  - take medications as prescribed as evidenced by patient report and record review check glucose DAILY FASTING OR IF SYMPTOMATIC, document, and provide at future appointments collaborate with provider on medication access solutions target a minimum of 150 minutes of moderate intensity exercise weekly engage in dietary modifications by FOLLOWING  HEALTHY PLATE METHOD HANDOUT/heart healthy diet         This is a list of  the screening recommended for you and due dates:  Health Maintenance  Topic Date Due   COVID-19 Vaccine (8 - 2023-24 season) 01/20/2022   Complete foot exam   03/18/2022   Eye exam for diabetics  04/18/2022   DEXA scan (bone density measurement)  09/16/2022*   Flu Shot  09/17/2022   Hemoglobin A1C  10/18/2022   Yearly kidney function blood test for diabetes  01/17/2023   Yearly kidney health urinalysis for diabetes  01/17/2023   Cologuard (Stool DNA test)  02/06/2023   Medicare Annual Wellness Visit  08/17/2023   Pneumonia Vaccine  Completed   Hepatitis C Screening  Completed   Zoster (Shingles) Vaccine  Completed   HPV Vaccine  Aged Out   DTaP/Tdap/Td vaccine  Discontinued  *Topic was postponed. The date shown is not the original due date.    Advanced directives: In Chart   Conditions/risks identified: Aim for 30 minutes of exercise or brisk walking, 6-8 glasses of water, and 5 servings of fruits and vegetables each day.   Next appointment: Follow up in one year for your annual wellness visit    Preventive Care 65 Years and Older, Female Preventive care refers to lifestyle choices and visits with your health care provider that can promote health and wellness. What does preventive care include? A yearly physical exam. This is also called an annual well check. Dental exams once or twice a year. Routine eye exams. Ask your health care provider how often you should have your eyes checked. Personal lifestyle choices, including: Daily care of your teeth and gums. Regular physical activity. Eating a healthy diet. Avoiding tobacco and drug use. Limiting alcohol use. Practicing safe sex. Taking low-dose aspirin every day. Taking vitamin and mineral supplements as recommended by your health care provider. What happens during an annual well check? The services and screenings done by your health care provider during your annual well check will depend on your age, overall health,  lifestyle risk factors, and family history of disease. Counseling  Your health care provider may ask you questions about your: Alcohol use. Tobacco use. Drug use. Emotional well-being. Home and relationship well-being. Sexual activity. Eating habits. History of falls. Memory and ability to understand (cognition). Work and work Astronomer. Reproductive health. Screening  You may have the following tests or measurements: Height, weight, and BMI. Blood pressure. Lipid and cholesterol levels. These may be checked every 5 years, or more frequently if you are over 38 years old. Skin check. Lung cancer screening. You may have this screening every year starting at age 67 if you have a 30-pack-year history of smoking and currently smoke or have quit within the past 15 years. Fecal occult blood test (FOBT) of the stool. You may have this test every year starting at age 68. Flexible sigmoidoscopy or colonoscopy. You may have a sigmoidoscopy every 5 years or a colonoscopy every 10 years starting at age 50. Hepatitis C blood test. Hepatitis B blood test. Sexually transmitted disease (STD) testing. Diabetes screening. This is done by checking your blood sugar (glucose) after you have not eaten for a while (fasting). You may have this done every 1-3 years. Bone density scan. This is done to screen for osteoporosis. You may have this done starting at age 22. Mammogram. This may be done every 1-2 years. Talk to your health care provider about how often you should have  regular mammograms. Talk with your health care provider about your test results, treatment options, and if necessary, the need for more tests. Vaccines  Your health care provider may recommend certain vaccines, such as: Influenza vaccine. This is recommended every year. Tetanus, diphtheria, and acellular pertussis (Tdap, Td) vaccine. You may need a Td booster every 10 years. Zoster vaccine. You may need this after age  62. Pneumococcal 13-valent conjugate (PCV13) vaccine. One dose is recommended after age 32. Pneumococcal polysaccharide (PPSV23) vaccine. One dose is recommended after age 78. Talk to your health care provider about which screenings and vaccines you need and how often you need them. This information is not intended to replace advice given to you by your health care provider. Make sure you discuss any questions you have with your health care provider. Document Released: 03/01/2015 Document Revised: 10/23/2015 Document Reviewed: 12/04/2014 Elsevier Interactive Patient Education  2017 [place] Prevention in the Home Falls can cause injuries. They can happen to people of all ages. There are many things you can do to make your home safe and to help prevent falls. What can I do on the outside of my home? Regularly fix the edges of walkways and driveways and fix any cracks. Remove anything that might make you trip as you walk through a door, such as a raised step or threshold. Trim any bushes or trees on the path to your home. Use bright outdoor lighting. Clear any walking paths of anything that might make someone trip, such as rocks or tools. Regularly check to see if handrails are loose or broken. Make sure that both sides of any steps have handrails. Any raised decks and porches should have guardrails on the edges. Have any leaves, snow, or ice cleared regularly. Use sand or salt on walking paths during winter. Clean up any spills in your garage right away. This includes oil or grease spills. What can I do in the bathroom? Use night lights. Install grab bars by the toilet and in the tub and shower. Do not use towel bars as grab bars. Use non-skid mats or decals in the tub or shower. If you need to sit down in the shower, use a plastic, non-slip stool. Keep the floor dry. Clean up any water that spills on the floor as soon as it happens. Remove soap buildup in the tub or shower  regularly. Attach bath mats securely with double-sided non-slip rug tape. Do not have throw rugs and other things on the floor that can make you trip. What can I do in the bedroom? Use night lights. Make sure that you have a light by your bed that is easy to reach. Do not use any sheets or blankets that are too big for your bed. They should not hang down onto the floor. Have a firm chair that has side arms. You can use this for support while you get dressed. Do not have throw rugs and other things on the floor that can make you trip. What can I do in the kitchen? Clean up any spills right away. Avoid walking on wet floors. Keep items that you use a lot in easy-to-reach places. If you need to reach something above you, use a strong step stool that has a grab bar. Keep electrical cords out of the way. Do not use floor polish or wax that makes floors slippery. If you must use wax, use non-skid floor wax. Do not have throw rugs and other things on the floor that  can make you trip. What can I do with my stairs? Do not leave any items on the stairs. Make sure that there are handrails on both sides of the stairs and use them. Fix handrails that are broken or loose. Make sure that handrails are as long as the stairways. Check any carpeting to make sure that it is firmly attached to the stairs. Fix any carpet that is loose or worn. Avoid having throw rugs at the top or bottom of the stairs. If you do have throw rugs, attach them to the floor with carpet tape. Make sure that you have a light switch at the top of the stairs and the bottom of the stairs. If you do not have them, ask someone to add them for you. What else can I do to help prevent falls? Wear shoes that: Do not have high heels. Have rubber bottoms. Are comfortable and fit you well. Are closed at the toe. Do not wear sandals. If you use a stepladder: Make sure that it is fully opened. Do not climb a closed stepladder. Make sure that  both sides of the stepladder are locked into place. Ask someone to hold it for you, if possible. Clearly mark and make sure that you can see: Any grab bars or handrails. First and last steps. Where the edge of each step is. Use tools that help you move around (mobility aids) if they are needed. These include: Canes. Walkers. Scooters. Crutches. Turn on the lights when you go into a dark area. Replace any light bulbs as soon as they burn out. Set up your furniture so you have a clear path. Avoid moving your furniture around. If any of your floors are uneven, fix them. If there are any pets around you, be aware of where they are. Review your medicines with your doctor. Some medicines can make you feel dizzy. This can increase your chance of falling. Ask your doctor what other things that you can do to help prevent falls. This information is not intended to replace advice given to you by your health care provider. Make sure you discuss any questions you have with your health care provider. Document Released: 11/29/2008 Document Revised: 07/11/2015 Document Reviewed: 03/09/2014 Elsevier Interactive Patient Education  2017 [employer].

## 2022-09-04 ENCOUNTER — Telehealth: Payer: Self-pay

## 2022-09-04 NOTE — Telephone Encounter (Signed)
Debra Miranda from ([facility]) called office.  Pt is scheduled for a follow up on 11/12/22  at 3:15 with Dr.Tucker.  Debra Miranda to notify pt of appointment date and time.

## 2022-09-09 ENCOUNTER — Other Ambulatory Visit: Payer: Medicare PPO

## 2022-09-09 ENCOUNTER — Encounter: Payer: Self-pay | Admitting: Family Medicine

## 2022-09-09 ENCOUNTER — Ambulatory Visit (INDEPENDENT_AMBULATORY_CARE_PROVIDER_SITE_OTHER): Payer: Medicare PPO | Admitting: Family Medicine

## 2022-09-09 ENCOUNTER — Other Ambulatory Visit (INDEPENDENT_AMBULATORY_CARE_PROVIDER_SITE_OTHER): Payer: Medicare PPO

## 2022-09-09 VITALS — BP 110/68 | HR 77 | Temp 98.5°F | Wt 173.0 lb

## 2022-09-09 DIAGNOSIS — M8589 Other specified disorders of bone density and structure, multiple sites: Secondary | ICD-10-CM | POA: Diagnosis not present

## 2022-09-09 DIAGNOSIS — E1169 Type 2 diabetes mellitus with other specified complication: Secondary | ICD-10-CM

## 2022-09-09 DIAGNOSIS — E785 Hyperlipidemia, unspecified: Secondary | ICD-10-CM

## 2022-09-09 DIAGNOSIS — Z780 Asymptomatic menopausal state: Secondary | ICD-10-CM | POA: Diagnosis not present

## 2022-09-09 DIAGNOSIS — Z794 Long term (current) use of insulin: Secondary | ICD-10-CM

## 2022-09-09 LAB — BAYER DCA HB A1C WAIVED: HB A1C (BAYER DCA - WAIVED): 7.1 % — ABNORMAL HIGH (ref 4.8–5.6)

## 2022-09-09 NOTE — Progress Notes (Signed)
Subjective: CC:DM PCP: Raliegh Ip, DO MVH:QIONG Debra Miranda is a 77 y.o. female presenting to clinic today for:  1. Type 2 Diabetes with hyperlipidemia:  Patient reports compliance with farxiga, insulin and lipitor.  She discontinued utilization of freestyle libre because it was falling off and very expensive.  She admits that she does not frequently check her blood sugars with fingersticks.  She denies any recent hypoglycemic episodes.  Denies any vaginitis, chest pain, shortness of breath or visual disturbance  Diabetes Health Maintenance Due  Topic Date Due   FOOT EXAM  03/18/2022   OPHTHALMOLOGY EXAM  04/18/2022   HEMOGLOBIN A1C  10/18/2022    Last A1c:  Lab Results  Component Value Date   HGBA1C 8.9 (H) 04/17/2022    ROS: Per HPI  Allergies  Allergen Reactions   Augmentin [Amoxicillin-Pot Clavulanate] Nausea And Vomiting   Latex Itching   Past Medical History:  Diagnosis Date   Arthritis    right hand   Cataract    bilateral   Depression    many years ago, no meds   Diabetes mellitus without complication (HCC)    type 2   Endometrial adenocarcinoma (HCC) 01/05/2018   treated with HDR   Environmental allergies    Family history of breast cancer    Family history of kidney cancer    Family history of pancreatic cancer    Family history of stomach cancer    Family history of uterine cancer    Hemorrhoids    History of radiation therapy 04/05/18,02/25,3/3,3/10,3/18   04/05/2018, 04/12/2018, 04/19/2018, 04/26/2018, 05/04/2018: Vagina, Iridium HDR, 3 cm cylinder, tx length of 3 cm / 30 Gy in 5 fractions.. Dr. Antony Blackbird   Incarcerated umbilical hernia    Nodular basal cell carcinoma (BCC) 08/2017   LEFT LOWER LEG above foot   Orthostatic hypotension    Patient is unaware of this diagnosis   Strangulated umbilical hernia     Current Outpatient Medications:    atorvastatin (LIPITOR) 20 MG tablet, TAKE 1 TABLET BY MOUTH EVERY DAY, Disp: 90 tablet, Rfl:  0   Continuous Glucose Receiver (FREESTYLE LIBRE 3 READER) DEVI, 1 each by Does not apply route daily. Use to test sugar continuously DX: E11.69, Disp: 1 each, Rfl: 0   Continuous Glucose Sensor (FREESTYLE LIBRE 3 SENSOR) MISC, Apply to arm every 14 days, Use to test blood sugar continuously. DX:E11.69, Disp: 6 each, Rfl: 3   dapagliflozin propanediol (FARXIGA) 10 MG TABS tablet, Take 1 tablet (10 mg total) by mouth daily., Disp: 90 tablet, Rfl: 4   escitalopram (LEXAPRO) 10 MG tablet, TAKE 1 TABLET BY MOUTH EVERY DAY, Disp: 90 tablet, Rfl: 0   Insulin Glargine (BASAGLAR KWIKPEN) 100 UNIT/ML, Inject 20 Units into the skin at bedtime. Sample #1pen LOT [id number], EXP 8/21 (Patient taking differently: Inject 16 Units into the skin at bedtime. Sample #1pen LOT [id number], EXP 8/21), Disp: 15 mL, Rfl: 3   Lactobacillus-Inulin (CULTURELLE DIGESTIVE HEALTH) CAPS, Take 1 capsule daily for digestive health, Disp: 90 capsule, Rfl: 3 Social History   Socioeconomic History   Marital status: Widowed    Spouse name: Not on file   Number of children: 1   Years of education: 14   Highest education level: Associate degree: occupational, Scientist, product/process development, or vocational program  Occupational History   Occupation: Retired    Comment: [employer]  Tobacco Use   Smoking status: Every Day    Types: Cigars   Smokeless tobacco: Never  Tobacco comments:    4 small cigars a day  Vaping Use   Vaping status: Former   Start date: 02/16/2010   Quit date: 02/17/2011   Devices: unsure  Substance and Sexual Activity   Alcohol use: Not Currently   Drug use: No   Sexual activity: Not Currently  Other Topics Concern   Not on file  Social History Narrative   Her daughter lives with her and helps with bills and ride if she needs   Patient is pretty independent, but sometimes has excessive pain and needs assistance   Social Determinants of Health   Financial Resource Strain: Low Risk  (08/17/2022)   Overall Financial  Resource Strain (CARDIA)    Difficulty of Paying Living Expenses: Not hard at all  Food Insecurity: No Food Insecurity (08/13/2021)   Hunger Vital Sign    Worried About Running Out of Food in the Last Year: Never true    Ran Out of Food in the Last Year: Never true  Transportation Needs: No Transportation Needs (08/13/2021)   PRAPARE - Administrator, Civil Service (Medical): No    Lack of Transportation (Non-Medical): No  Physical Activity: Insufficiently Active (08/17/2022)   Exercise Vital Sign    Days of Exercise per Week: 2 days    Minutes of Exercise per Session: 30 min  Stress: No Stress Concern Present (08/17/2022)   [employer] of Occupational Health - Occupational Stress Questionnaire    Feeling of Stress : Not at all  Social Connections: Moderately Isolated (08/17/2022)   Social Connection and Isolation Panel [NHANES]    Frequency of Communication with Friends and Family: More than three times a week    Frequency of Social Gatherings with Friends and Family: More than three times a week    Attends Religious Services: 1 to 4 times per year    Active Member of [employer] or Organizations: No    Attends Banker Meetings: Never    Marital Status: Widowed  Intimate Partner Violence: Not At Risk (08/13/2021)   Humiliation, Afraid, Rape, and Kick questionnaire    Fear of Current or Ex-Partner: No    Emotionally Abused: No    Physically Abused: No    Sexually Abused: No   Family History  Problem Relation Age of Onset   Cervical cancer Mother 52       treated with radiation   Pancreatic cancer Mother 85   Stroke Father    Pulmonary fibrosis Brother    Alzheimer's disease Brother    Stomach cancer Brother        dx 63s   Diabetes Brother    Lung cancer Maternal Grandmother    Stroke Paternal Grandfather    Uterine cancer Maternal Aunt        dx 60s   Kidney cancer Maternal Uncle        dx 4s   Colon cancer Maternal Uncle        dx 25s   Breast  cancer Cousin        maternal cousin dx 17    Objective: Office vital signs reviewed. BP 110/68   Pulse 77   Temp 98.5 F (36.9 Debra)   Wt 173 lb (78.5 kg)   SpO2 94%   BMI 27.10 kg/m   Physical Examination:  General: Awake, alert, well nourished, No acute distress HEENT: sclera white, MMM Cardio: regular rate and rhythm, S1S2 heard, no murmurs appreciated Pulm: clear to auscultation bilaterally, no wheezes, rhonchi  or rales; normal work of breathing on room air Neuro see DM foot  Diabetic Foot Exam - Simple   Simple Foot Form Diabetic Foot exam was performed with the following findings: Yes 09/09/2022  8:52 PM  Visual Inspection See comments: Yes Sensation Testing Intact to touch and monofilament testing bilaterally: Yes Pulse Check Posterior Tibialis and Dorsalis pulse intact bilaterally: Yes Comments She has plantar calluses quite thickened along the plantar aspect of the left foot at approximately the fifth MTP.  Hammertoe and noted along the fourth DIP on the left.  Mild plantar callus along the first MTP on the right.  Onychomycotic changes noted throughout bilateral nails      Assessment/ Plan: 77 y.o. female   Type 2 diabetes mellitus with other specified complication, with long-term current use of insulin (HCC) - Plan: Bayer DCA Hb A1c Waived  Hyperlipidemia associated with type 2 diabetes mellitus (HCC)  Sugar very close to goal with A1c of 7.1.  Given her lack of consistent blood sugar monitoring I am very hesitant to advance any insulin on this patient.  I really reinforced need for at least spot checking the blood sugar particularly if she is feeling poorly.  We again reviewed the signs and symptoms that would be consistent with hypoglycemia.  She will schedule diabetic eye exam with Dr. Kristian Covey at [facility] ophthalmology.  Continue statin for hyperlipidemia.  May follow-up in 4 months, sooner if concerns arise  No orders of the defined types were placed in this  encounter.  No orders of the defined types were placed in this encounter.    Raliegh Ip, DO [facility] [phone]

## 2022-11-03 ENCOUNTER — Other Ambulatory Visit: Payer: Self-pay

## 2022-11-03 DIAGNOSIS — E1169 Type 2 diabetes mellitus with other specified complication: Secondary | ICD-10-CM

## 2022-11-04 ENCOUNTER — Other Ambulatory Visit: Payer: Medicare PPO

## 2022-11-04 DIAGNOSIS — Z794 Long term (current) use of insulin: Secondary | ICD-10-CM

## 2022-11-04 DIAGNOSIS — E1169 Type 2 diabetes mellitus with other specified complication: Secondary | ICD-10-CM | POA: Diagnosis not present

## 2022-11-05 LAB — MICROALBUMIN / CREATININE URINE RATIO
Creatinine, Urine: 70.2 mg/dL
Microalb/Creat Ratio: 35 mg/g creat — ABNORMAL HIGH (ref 0–29)
Microalbumin, Urine: 24.7 ug/mL

## 2022-11-10 ENCOUNTER — Encounter: Payer: Self-pay | Admitting: Gynecologic Oncology

## 2022-11-11 NOTE — Progress Notes (Unsigned)
Gynecologic Oncology Return Clinic Visit  11/12/22  Reason for Visit: Surveillance visit in the setting of endometrial cancer   Treatment History: Oncology History  Endometrial adenocarcinoma (HCC)  01/05/2018 Surgery   HSC D&C with findings showing a single polyp and grossly normal endometrium. The polyp returned with Grade 2 Endometrioid Adenocarcinoma "partially involving a polyp".   01/05/2018 Initial Diagnosis   Endometrial adenocarcinoma (HCC)   02/03/2018 Genetic Testing   Patient has genetic testing with [facility]. Results revealed patient has the following mutation(s): none   02/08/2018 Surgery   Robotic assisted TLH/BSO with sentinel nodes and washings. Frozen section returned with no MI   02/08/2018 Pathologic Stage   IA grade 2, endometrioid, no MI, negative LNs, no LVSI   04/05/2018 - 05/04/2018 Radiation Therapy   Vaginal brachytherapy, Iridium HDR, 3 cm cylinder, tx length of 3 cm / 30 Gy in 5 fractions.     Interval History: Doing well.  Denies any vaginal bleeding or discharge.  Has minimal vulvar irritation related to wearing depends.  Still self catheterizing most of the time.  Struggles with constipation and diarrhea intermittently, at baseline since 2021 surgery. Denies any abdominal or pelvic pain.  Past Medical/Surgical History: Past Medical History:  Diagnosis Date   Arthritis    right hand   Cataract    bilateral   Depression    many years ago, no meds   Diabetes mellitus without complication (HCC)    type 2   Endometrial adenocarcinoma (HCC) 01/05/2018   treated with HDR   Environmental allergies    Family history of breast cancer    Family history of kidney cancer    Family history of pancreatic cancer    Family history of stomach cancer    Family history of uterine cancer    Hemorrhoids    History of radiation therapy 04/05/18,02/25,3/3,3/10,3/18   04/05/2018, 04/12/2018, 04/19/2018, 04/26/2018, 05/04/2018: Vagina, Iridium HDR, 3 cm  cylinder, tx length of 3 cm / 30 Gy in 5 fractions.. Dr. Antony Blackbird   Incarcerated umbilical hernia    Nodular basal cell carcinoma (BCC) 08/2017   LEFT LOWER LEG above foot   Orthostatic hypotension    Patient is unaware of this diagnosis   Strangulated umbilical hernia     Past Surgical History:  Procedure Laterality Date   ABDOMINAL HYSTERECTOMY     Dr. Doroteo Glassman 02-08-18    BOWEL RESECTION N/A 06/01/2019   Procedure: SMALL BOWEL RESECTION;  Surgeon: Lucretia Roers, MD;  Location: [facility];  Service: General;  Laterality: N/A;   CATARACT EXTRACTION     RIGHT EYE   HYSTEROSCOPY WITH D & C N/A 01/05/2018   Procedure: DILATATION AND CURETTAGE/HYSTEROSCOPY;  Surgeon: Lazaro Arms, MD;  Location: [facility];  Service: Gynecology;  Laterality: N/A;   LUMBAR DISC SURGERY  2006   OMENTECTOMY N/A 06/01/2019   Procedure: OMENTECTOMY;  Surgeon: Lucretia Roers, MD;  Location: [facility];  Service: General;  Laterality: N/A;   POLYPECTOMY N/A 01/05/2018   Procedure: ENDOMETRIAL POLYPECTOMY;  Surgeon: Lazaro Arms, MD;  Location: [facility];  Service: Gynecology;  Laterality: N/A;   ROBOTIC ASSISTED TOTAL HYSTERECTOMY WITH BILATERAL SALPINGO OOPHERECTOMY Bilateral 02/08/2018   Procedure: XI ROBOTIC ASSISTED TOTAL HYSTERECTOMY WITH BILATERAL SALPINGO OOPHORECTOMY;  Surgeon: Shonna Chock, MD;  Location: [facility];  Service: Gynecology;  Laterality: Bilateral;   SENTINEL NODE BIOPSY Bilateral 02/08/2018   Procedure: SENTINEL NODE BIOPSY AND PELVIC LYMPH NODE SAMPLING;  Surgeon: Shonna Chock, MD;  Location: [facility];  Service: Gynecology;  Laterality: Bilateral;   TONSILLECTOMY     as a child   UMBILICAL HERNIA REPAIR N/A 06/01/2019   Procedure: HERNIA REPAIR UMBILICAL ADULT;  Surgeon: Lucretia Roers, MD;  Location: [facility];  Service: General;  Laterality: N/A;    Family History  Problem Relation Age of Onset   Cervical cancer Mother 85       treated with radiation   Pancreatic cancer Mother  75   Stroke Father    Pulmonary fibrosis Brother    Alzheimer's disease Brother    Stomach cancer Brother        dx 55s   Diabetes Brother    Lung cancer Maternal Grandmother    Stroke Paternal Grandfather    Uterine cancer Maternal Aunt        dx 48s   Kidney cancer Maternal Uncle        dx 23s   Colon cancer Maternal Uncle        dx 58s   Breast cancer Cousin        maternal cousin dx 71    Social History   Socioeconomic History   Marital status: Widowed    Spouse name: Not on file   Number of children: 1   Years of education: 14   Highest education level: Associate degree: occupational, Scientist, product/process development, or vocational program  Occupational History   Occupation: Retired    Comment: [employer]  Tobacco Use   Smoking status: Every Day    Types: Cigars   Smokeless tobacco: Never   Tobacco comments:    4 small cigars a day  Vaping Use   Vaping status: Former   Start date: 02/16/2010   Quit date: 02/17/2011   Devices: unsure  Substance and Sexual Activity   Alcohol use: Not Currently   Drug use: No   Sexual activity: Not Currently  Other Topics Concern   Not on file  Social History Narrative   Her daughter lives with her and helps with bills and ride if she needs   Patient is pretty independent, but sometimes has excessive pain and needs assistance   Social Determinants of Health   Financial Resource Strain: Low Risk  (08/17/2022)   Overall Financial Resource Strain (CARDIA)    Difficulty of Paying Living Expenses: Not hard at all  Food Insecurity: No Food Insecurity (08/13/2021)   Hunger Vital Sign    Worried About Running Out of Food in the Last Year: Never true    Ran Out of Food in the Last Year: Never true  Transportation Needs: No Transportation Needs (08/13/2021)   PRAPARE - Administrator, Civil Service (Medical): No    Lack of Transportation (Non-Medical): No  Physical Activity: Insufficiently Active (08/17/2022)   Exercise Vital Sign    Days of  Exercise per Week: 2 days    Minutes of Exercise per Session: 30 min  Stress: No Stress Concern Present (08/17/2022)   [employer] of Occupational Health - Occupational Stress Questionnaire    Feeling of Stress : Not at all  Social Connections: Moderately Isolated (08/17/2022)   Social Connection and Isolation Panel [NHANES]    Frequency of Communication with Friends and Family: More than three times a week    Frequency of Social Gatherings with Friends and Family: More than three times a week    Attends Religious Services: 1 to 4 times per year    Active Member of Clubs or Organizations: No  Attends Banker Meetings: Never    Marital Status: Widowed    Current Medications:  Current Outpatient Medications:    atorvastatin (LIPITOR) 20 MG tablet, TAKE 1 TABLET BY MOUTH EVERY DAY, Disp: 90 tablet, Rfl: 0   Calcium-Vitamin D 600-5 MG-MCG TABS, Take by mouth., Disp: , Rfl:    Cholecalciferol (VITAMIN D3) 50 MCG (2000 UT) TABS, Take by mouth., Disp: , Rfl:    Continuous Glucose Receiver (FREESTYLE LIBRE 3 READER) DEVI, 1 each by Does not apply route daily. Use to test sugar continuously DX: E11.69, Disp: 1 each, Rfl: 0   Continuous Glucose Sensor (FREESTYLE LIBRE 3 SENSOR) MISC, Apply to arm every 14 days, Use to test blood sugar continuously. DX:E11.69, Disp: 6 each, Rfl: 3   dapagliflozin propanediol (FARXIGA) 10 MG TABS tablet, Take 1 tablet (10 mg total) by mouth daily., Disp: 90 tablet, Rfl: 4   escitalopram (LEXAPRO) 10 MG tablet, TAKE 1 TABLET BY MOUTH EVERY DAY, Disp: 90 tablet, Rfl: 0   Insulin Glargine (BASAGLAR KWIKPEN) 100 UNIT/ML, Inject 20 Units into the skin at bedtime. Sample #1pen LOT [id number], EXP 8/21 (Patient taking differently: Inject 16 Units into the skin at bedtime. Sample #1pen LOT [id number], EXP 8/21), Disp: 15 mL, Rfl: 3   Lactobacillus-Inulin (CULTURELLE DIGESTIVE HEALTH) CAPS, Take 1 capsule daily for digestive health, Disp: 90 capsule, Rfl:  3  Review of Systems: + Hearing loss, vision problems, ringing in ears, constipation, diarrhea, joint pain, back pain. Denies appetite changes, fevers, chills, fatigue, unexplained weight changes. Denies neck lumps or masses, mouth sores, or voice changes. Denies cough or wheezing.  Denies shortness of breath. Denies chest pain or palpitations. Denies leg swelling. Denies abdominal distention, pain, blood in stools, nausea, vomiting, or early satiety. Denies pain with intercourse, dysuria, frequency, hematuria or incontinence. Denies hot flashes, pelvic pain, vaginal bleeding or vaginal discharge.   Denies muscle pain/cramps. Denies itching, rash, or wounds. Denies dizziness, headaches, numbness or seizures. Denies swollen lymph nodes or glands, denies easy bruising or bleeding. Denies anxiety, depression, confusion, or decreased concentration.  Physical Exam: BP (!) 142/76 Comment: bp recheck  Pulse 88   Temp 97.6 F (36.4 C) (Oral)   Resp 18   Ht 5\' 7"  (1.702 m)   Wt 174 lb 6.4 oz (79.1 kg)   SpO2 97%   BMI 27.31 kg/m  General: Alert, oriented, no acute distress. HEENT: Normocephalic, atraumatic, sclera anicteric. Chest: Bilateral inspiratory wheezing, otherwise lungs clear to auscultation bilaterally. Cardiovascular: Regular rate and rhythm, no murmurs. Abdomen: soft, nontender.  Normoactive bowel sounds.  No masses or hepatosplenomegaly appreciated.  Well-healed incisions. Extremities: Grossly normal range of motion.  Warm, well perfused.  No edema bilaterally. Skin: No rashes or lesions noted. Lymphatics: No cervical, supraclavicular, or inguinal adenopathy. GU: Mild erythema of the bilateral vulva, no excoriation or lesions.  Speculum exam reveals mildly atrophic vaginal mucosa, radiation changes noted at the cuff.  No lesions or atypical vascularity.  Bimanual exam reveals cuff intact, no nodularity or masses, mild adhesions noted at the apex.  Rectovaginal exam confirms  these findings.  Laboratory & Radiologic Studies: None new  Assessment & Plan: Debra Miranda is a 77 y.o. woman with stage IA grade 2 (stage IA1 by 2023 staging)endometrial adenocarcinoma. Completed adj VBT 04/2018.   The patient is NED on exam today.  Overall doing well.   Per SGO and NCCN surveillance recommendations, we will continue with visits every 6 months until 5 years out from completion of  adjuvant treatment, which will be in March 2025.  Patient sees Dr. Roselind Messier in March.  She will be due to graduate from oncology care at that time.    Reviewed signs and symptoms of recurrence that should prompt a phone call sooner than her next appointment.  20 minutes of total time was spent for this patient encounter, including preparation, face-to-face counseling with the patient and coordination of care, and documentation of the encounter.  Eugene Garnet, MD  Division of Gynecologic Oncology  Department of Obstetrics and Gynecology  [facility]

## 2022-11-12 ENCOUNTER — Encounter: Payer: Self-pay | Admitting: Gynecologic Oncology

## 2022-11-12 ENCOUNTER — Inpatient Hospital Stay: Payer: Medicare PPO | Attending: Gynecologic Oncology | Admitting: Gynecologic Oncology

## 2022-11-12 VITALS — BP 136/62 | HR 88 | Temp 97.6°F | Resp 18 | Ht 67.0 in | Wt 174.4 lb

## 2022-11-12 DIAGNOSIS — Z8542 Personal history of malignant neoplasm of other parts of uterus: Secondary | ICD-10-CM | POA: Diagnosis not present

## 2022-11-12 DIAGNOSIS — C541 Malignant neoplasm of endometrium: Secondary | ICD-10-CM

## 2022-11-12 NOTE — Patient Instructions (Signed)
It was good to see you today.  I do not see any evidence of cancer recurrence.  After your follow-up with Dr. Roselind Messier, we will be 5 years out from completing cancer treatment.  You will graduate from needing to come to the cancer center at that time.  Please do not hesitate to call with any symptoms in the future such as vaginal bleeding, pelvic pain, or unintentional weight loss.

## 2022-11-18 ENCOUNTER — Other Ambulatory Visit: Payer: Self-pay | Admitting: Family Medicine

## 2022-11-19 ENCOUNTER — Other Ambulatory Visit: Payer: Self-pay | Admitting: Family Medicine

## 2022-11-19 DIAGNOSIS — E1169 Type 2 diabetes mellitus with other specified complication: Secondary | ICD-10-CM

## 2022-12-15 ENCOUNTER — Telehealth: Payer: Self-pay | Admitting: Pharmacist

## 2022-12-15 ENCOUNTER — Telehealth: Payer: Medicare PPO

## 2022-12-15 ENCOUNTER — Ambulatory Visit (INDEPENDENT_AMBULATORY_CARE_PROVIDER_SITE_OTHER): Payer: Medicare PPO | Admitting: Pharmacist

## 2022-12-15 DIAGNOSIS — Z794 Long term (current) use of insulin: Secondary | ICD-10-CM

## 2022-12-15 NOTE — Telephone Encounter (Signed)
Can you send me PAP Farxiga 10mg , basaglar 20 units Thanks!

## 2022-12-29 NOTE — Progress Notes (Deleted)
Pharmacy Medication Assistance Program Note    12/29/2022  Patient ID: Debra Miranda, female   DOB: [birth year], 77 y.o.   MRN: [id number]     12/29/2022  Outreach Medication One  Initial Outreach Date (Medication One) 12/15/2022  Manufacturer Medication One [employer]  [employer] Drugs Farxiga  Dose of Farxiga 10MG   Type of Radiographer, therapeutic Assistance  Name of Prescriber Delynn Flavin  Date Application Submitted to Manufacturer 12/29/2022  Method Application Sent to Manufacturer Fax         Patient ID: Debra Miranda, female  DOB: March 15, 1945, 77 y.o.  MRN:  [id number]     12/29/2022  Outreach Medication Two  Initial Outreach Date (Medication Two) 12/15/2022  Manufacturer Medication Two Retail buyer Drugs Basaglar  Type of Radiographer, therapeutic Assistance  Name of Prescriber ASHLY GOTTSCHALK  Method Application Sent to [employer] Fax  Date Application Submitted to Manufacturer 12/29/2022       Siri Cole, CphT Pharmacy Patient Advocate

## 2023-01-04 DIAGNOSIS — E113213 Type 2 diabetes mellitus with mild nonproliferative diabetic retinopathy with macular edema, bilateral: Secondary | ICD-10-CM | POA: Diagnosis not present

## 2023-01-04 LAB — HM DIABETES EYE EXAM

## 2023-01-11 ENCOUNTER — Ambulatory Visit: Payer: Medicare PPO | Admitting: Family Medicine

## 2023-01-11 ENCOUNTER — Encounter: Payer: Self-pay | Admitting: Family Medicine

## 2023-01-11 VITALS — BP 131/79 | HR 74 | Temp 98.4°F | Ht 67.0 in | Wt 176.0 lb

## 2023-01-11 DIAGNOSIS — Z23 Encounter for immunization: Secondary | ICD-10-CM

## 2023-01-11 DIAGNOSIS — Z794 Long term (current) use of insulin: Secondary | ICD-10-CM | POA: Diagnosis not present

## 2023-01-11 DIAGNOSIS — E785 Hyperlipidemia, unspecified: Secondary | ICD-10-CM

## 2023-01-11 DIAGNOSIS — E1169 Type 2 diabetes mellitus with other specified complication: Secondary | ICD-10-CM | POA: Diagnosis not present

## 2023-01-11 LAB — BAYER DCA HB A1C WAIVED: HB A1C (BAYER DCA - WAIVED): 7.5 % — ABNORMAL HIGH (ref 4.8–5.6)

## 2023-01-11 MED ORDER — ATORVASTATIN CALCIUM 20 MG PO TABS: 20.0000 mg | ORAL_TABLET | Freq: Every day | ORAL | 3 refills | Status: DC

## 2023-01-11 MED ORDER — ESCITALOPRAM OXALATE 10 MG PO TABS: 10.0000 mg | ORAL_TABLET | Freq: Every day | ORAL | 3 refills | Status: DC

## 2023-01-11 NOTE — Progress Notes (Signed)
Subjective: CC:DM PCP: Debra Ip, DO [id number] [name] is a 77 y.o. female presenting to clinic today for:  1. Type 2 Diabetes with hypertension, hyperlipidemia:  Reports NONcompliance with statin, Basaglar and [place].  Getting Social research officer, government through patient assistance program.  Needs more pen needles.  Raynelle Fanning, our clinical pharmacist was providing.  She notes that she simply would just forget for several days to use her medications.  Not monitoring her blood sugars at all.  Over the last week she has been taking the midday instead of in the evening time and this has been better at keeping her more consistent.  Diabetes Health Maintenance Due  Topic Date Due   HEMOGLOBIN A1C  03/12/2023   FOOT EXAM  09/09/2023   OPHTHALMOLOGY EXAM  01/04/2024    Last A1c:  Lab Results  Component Value Date   HGBA1C 7.1 (H) 09/09/2022    ROS: No vaginitis, chest pain, shortness of breath or dizziness reported.   ROS: Per HPI  Allergies  Allergen Reactions   Augmentin [Amoxicillin-Pot Clavulanate] Nausea And Vomiting   Latex Itching   Past Medical History:  Diagnosis Date   Arthritis    right hand   Cataract    bilateral   Depression    many years ago, no meds   Diabetes mellitus without complication (HCC)    type 2   Endometrial adenocarcinoma (HCC) 01/05/2018   treated with HDR   Environmental allergies    Family history of breast cancer    Family history of kidney cancer    Family history of pancreatic cancer    Family history of stomach cancer    Family history of uterine cancer    Hemorrhoids    History of radiation therapy 04/05/18,02/25,3/3,3/10,3/18   04/05/2018, 04/12/2018, 04/19/2018, 04/26/2018, 05/04/2018: Vagina, Iridium HDR, 3 cm cylinder, tx length of 3 cm / 30 Gy in 5 fractions.. Dr. Antony Blackbird   Incarcerated umbilical hernia    Nodular basal cell carcinoma (BCC) 08/2017   LEFT LOWER LEG above foot   Orthostatic hypotension    Patient is  unaware of this diagnosis   Strangulated umbilical hernia     Current Outpatient Medications:    atorvastatin (LIPITOR) 20 MG tablet, TAKE 1 TABLET BY MOUTH EVERY DAY, Disp: 90 tablet, Rfl: 0   Calcium-Vitamin D 600-5 MG-MCG TABS, Take by mouth., Disp: , Rfl:    Cholecalciferol (VITAMIN D3) 50 MCG (2000 UT) TABS, Take by mouth., Disp: , Rfl:    dapagliflozin propanediol (FARXIGA) 10 MG TABS tablet, Take 1 tablet (10 mg total) by mouth daily., Disp: 90 tablet, Rfl: 4   escitalopram (LEXAPRO) 10 MG tablet, TAKE 1 TABLET BY MOUTH EVERY DAY, Disp: 90 tablet, Rfl: 0   Insulin Glargine (BASAGLAR KWIKPEN) 100 UNIT/ML, Inject 20 Units into the skin at bedtime. Sample #1pen LOT [id number], EXP 8/21 (Patient taking differently: Inject 16 Units into the skin at bedtime. Sample #1pen LOT [id number], EXP 8/21), Disp: 15 mL, Rfl: 3   Lactobacillus-Inulin (CULTURELLE DIGESTIVE HEALTH) CAPS, Take 1 capsule daily for digestive health, Disp: 90 capsule, Rfl: 3 Social History   Socioeconomic History   Marital status: Widowed    Spouse name: Not on file   Number of children: 1   Years of education: 14   Highest education level: Associate degree: occupational, Scientist, product/process development, or vocational program  Occupational History   Occupation: Retired    Comment: [employer]  Tobacco Use   Smoking status: Every Day  Types: Cigars   Smokeless tobacco: Never   Tobacco comments:    4 small cigars a day  Vaping Use   Vaping status: Former   Start date: 02/16/2010   Quit date: 02/17/2011   Devices: unsure  Substance and Sexual Activity   Alcohol use: Not Currently   Drug use: No   Sexual activity: Not Currently  Other Topics Concern   Not on file  Social History Narrative   Her daughter lives with her and helps with bills and ride if she needs   Patient is pretty independent, but sometimes has excessive pain and needs assistance   Social Determinants of Health   Financial Resource Strain: Low Risk  (08/17/2022)    Overall Financial Resource Strain (CARDIA)    Difficulty of Paying Living Expenses: Not hard at all  Food Insecurity: No Food Insecurity (08/13/2021)   Hunger Vital Sign    Worried About Running Out of Food in the Last Year: Never true    Ran Out of Food in the Last Year: Never true  Transportation Needs: No Transportation Needs (08/13/2021)   PRAPARE - Administrator, Civil Service (Medical): No    Lack of Transportation (Non-Medical): No  Physical Activity: Insufficiently Active (08/17/2022)   Exercise Vital Sign    Days of Exercise per Week: 2 days    Minutes of Exercise per Session: 30 min  Stress: No Stress Concern Present (08/17/2022)   [employer] of Occupational Health - Occupational Stress Questionnaire    Feeling of Stress : Not at all  Social Connections: Moderately Isolated (08/17/2022)   Social Connection and Isolation Panel [NHANES]    Frequency of Communication with Friends and Family: More than three times a week    Frequency of Social Gatherings with Friends and Family: More than three times a week    Attends Religious Services: 1 to 4 times per year    Active Member of [employer] or Organizations: No    Attends Banker Meetings: Never    Marital Status: Widowed  Intimate Partner Violence: Not At Risk (08/13/2021)   Humiliation, Afraid, Rape, and Kick questionnaire    Fear of Current or Ex-Partner: No    Emotionally Abused: No    Physically Abused: No    Sexually Abused: No   Family History  Problem Relation Age of Onset   Cervical cancer Mother 2       treated with radiation   Pancreatic cancer Mother 29   Stroke Father    Pulmonary fibrosis Brother    Alzheimer's disease Brother    Stomach cancer Brother        dx 75s   Diabetes Brother    Lung cancer Maternal Grandmother    Stroke Paternal Grandfather    Uterine cancer Maternal Aunt        dx 39s   Kidney cancer Maternal Uncle        dx 70s   Colon cancer Maternal Uncle         dx 60s   Breast cancer Cousin        maternal cousin dx 18    Objective: Office vital signs reviewed. BP 131/79   Pulse 74   Temp 98.4 F (36.9 C)   Ht 5\' 7"  (1.702 m)   Wt 176 lb (79.8 kg)   SpO2 92%   BMI 27.57 kg/m   Physical Examination:  General: Awake, alert, chronically ill-appearing elderly female, No acute distress HEENT: sclera white, MMM  Cardio: regular rate and rhythm, S1S2 heard, no murmurs appreciated Pulm: Mild bibasilar expiratory wheezes with normal work of breathing on room air.  No rhonchi or rales.  Lab Results  Component Value Date   HGBA1C 7.5 (H) 01/11/2023     Assessment/ Plan: 77 y.o. female   Type 2 diabetes mellitus with other specified complication, with long-term current use of insulin (HCC) - Plan: Bayer DCA Hb A1c Waived  Encounter for immunization - Plan: Flu Vaccine Trivalent High Dose (Fluad)  Hyperlipidemia associated with type 2 diabetes mellitus (HCC) - Plan: atorvastatin (LIPITOR) 20 MG tablet   Sugar not at goal with A1c rising to 7.5.  I suspect this is due to missed medication doses.  We discussed need for more consistency.  For now we will change no medication dosing.  We will reconvene in 3 months, sooner if concerns arise.  She will continue statin.  Influenza vaccination administered.  Debra Ip, DO [facility] [phone]

## 2023-01-13 NOTE — Progress Notes (Signed)
Pharmacy Medication Assistance Program Note    01/13/2023  Patient ID: Debra Miranda, female   DOB: [birth year], 77 y.o.   MRN: [id number]     12/29/2022  Outreach Medication One  Initial Outreach Date (Medication One) 12/15/2022  Manufacturer Medication One [employer]  [employer] Drugs Farxiga  Dose of Farxiga 10MG   Type of Assistance Manufacturer Assistance  Name of Prescriber Delynn Flavin  Date Application Submitted to Manufacturer 12/29/2022  Method Application Sent to Manufacturer Fax            12/29/2022  Outreach Medication Two  Initial Outreach Date (Medication Two) 12/15/2022  Manufacturer Medication Two Retail buyer Drugs Basaglar  Type of Radiographer, therapeutic Assistance  Name of Prescriber ASHLY GOTTSCHALK  Method Application Sent to Manufacturer Fax  Date Application Submitted to Manufacturer 12/29/2022  Patient Assistance Determination Approved  Approval Start Date 02/17/2023

## 2023-01-25 NOTE — Telephone Encounter (Signed)
Pt called in to ask about her Debra Miranda pt assistance. Pt wanted to know if she needed an apt with Debra Miranda. I explained that she did not and that Debra Miranda and Debra Miranda are working on the [employer]. Pt aware that someone will reach out to her if she needs an apt.

## 2023-03-03 ENCOUNTER — Other Ambulatory Visit: Payer: Self-pay | Admitting: Family Medicine

## 2023-03-03 DIAGNOSIS — Z1231 Encounter for screening mammogram for malignant neoplasm of breast: Secondary | ICD-10-CM

## 2023-03-29 NOTE — Progress Notes (Signed)
 Pharmacy Medication Assistance Program Note    03/29/2023  Patient ID: Debra Miranda, female   DOB: 03/13/1945, 78 y.o.   MRN: [id number]     12/29/2022  Outreach Medication One  Initial Outreach Date (Medication One) 12/15/2022  Manufacturer Medication One Nurse, adult Drugs Farxiga   Dose of Farxiga  10MG   Type of Radiographer, therapeutic Assistance  Name of Prescriber Vicky Grange  Date Application Submitted to Manufacturer 12/29/2022  Method Application Sent to Manufacturer Fax  Patient Assistance Determination Approved  Approval End Date 02/16/2024        03/29/2023  Patient ID: Debra Miranda, female  DOB: [birth year], 78 y.o.  MRN:  [id number]     12/29/2022  Outreach Medication Two  Initial Outreach Date (Medication Two) 12/15/2022  Manufacturer Medication Two Retail buyer Drugs Basaglar   Type of Radiographer, therapeutic Assistance  Name of Prescriber ASHLY GOTTSCHALK  Method Application Sent to Manufacturer Fax  Date Application Submitted to Manufacturer 12/29/2022  Patient Assistance Determination Approved  Approval Start Date 02/17/2023

## 2023-04-01 ENCOUNTER — Other Ambulatory Visit: Payer: Self-pay | Admitting: Family Medicine

## 2023-04-14 ENCOUNTER — Encounter: Payer: Self-pay | Admitting: Family Medicine

## 2023-04-14 ENCOUNTER — Ambulatory Visit: Payer: Medicare HMO | Admitting: Family Medicine

## 2023-04-14 VITALS — BP 121/75 | HR 80 | Temp 98.3°F | Ht 67.0 in | Wt 176.0 lb

## 2023-04-14 DIAGNOSIS — C541 Malignant neoplasm of endometrium: Secondary | ICD-10-CM

## 2023-04-14 DIAGNOSIS — E785 Hyperlipidemia, unspecified: Secondary | ICD-10-CM

## 2023-04-14 DIAGNOSIS — Z1211 Encounter for screening for malignant neoplasm of colon: Secondary | ICD-10-CM

## 2023-04-14 DIAGNOSIS — E1169 Type 2 diabetes mellitus with other specified complication: Secondary | ICD-10-CM | POA: Diagnosis not present

## 2023-04-14 DIAGNOSIS — Z794 Long term (current) use of insulin: Secondary | ICD-10-CM | POA: Diagnosis not present

## 2023-04-14 DIAGNOSIS — Z23 Encounter for immunization: Secondary | ICD-10-CM

## 2023-04-14 LAB — BAYER DCA HB A1C WAIVED: HB A1C (BAYER DCA - WAIVED): 8.3 % — ABNORMAL HIGH (ref 4.8–5.6)

## 2023-04-14 MED ORDER — BASAGLAR KWIKPEN 100 UNIT/ML ~~LOC~~ SOPN: 24.0000 [IU] | PEN_INJECTOR | Freq: Every day | SUBCUTANEOUS | Status: DC

## 2023-04-14 NOTE — Progress Notes (Unsigned)
 Subjective: CC:DM PCP: Debra Ip, DO Debra Miranda:[id number] [name] is a 78 y.o. female presenting to clinic today for:  1. Type 2 Diabetes with hypertension, hyperlipidemia:  Glucometer:***.   High at home: ***; Low at home: ***, Taking medication(s): ***,.  Diabetes Health Maintenance Due  Topic Date Due   HEMOGLOBIN A1C  07/11/2023   FOOT EXAM  09/09/2023   OPHTHALMOLOGY EXAM  01/04/2024    Last A1c:  Lab Results  Component Value Date   HGBA1C 7.5 (H) 01/11/2023    ROS: ***dizziness, LOC, polyuria, polydipsia, unintended weight loss/gain, foot ulcerations, numbness or tingling in extremities, shortness of breath or chest pain.   ROS: Per HPI  Allergies  Allergen Reactions   Augmentin [Amoxicillin-Pot Clavulanate] Nausea And Vomiting   Latex Itching   Past Medical History:  Diagnosis Date   Arthritis    right hand   Cataract    bilateral   Depression    many years ago, no meds   Diabetes mellitus without complication (HCC)    type 2   Endometrial adenocarcinoma (HCC) 01/05/2018   treated with HDR   Environmental allergies    Family history of breast cancer    Family history of kidney cancer    Family history of pancreatic cancer    Family history of stomach cancer    Family history of uterine cancer    Hemorrhoids    History of radiation therapy 04/05/18,02/25,3/3,3/10,3/18   04/05/2018, 04/12/2018, 04/19/2018, 04/26/2018, 05/04/2018: Vagina, Iridium HDR, 3 cm cylinder, tx length of 3 cm / 30 Gy in 5 fractions.. Dr. Antony Miranda   Incarcerated umbilical hernia    Nodular basal cell carcinoma (BCC) 08/2017   LEFT LOWER LEG above foot   Orthostatic hypotension    Patient is unaware of this diagnosis   Strangulated umbilical hernia     Current Outpatient Medications:    atorvastatin (LIPITOR) 20 MG tablet, Take 1 tablet (20 mg total) by mouth daily., Disp: 90 tablet, Rfl: 3   Calcium-Vitamin D 600-5 MG-MCG TABS, Take by mouth., Disp: , Rfl:     Cholecalciferol (VITAMIN D3) 50 MCG (2000 UT) TABS, Take by mouth., Disp: , Rfl:    dapagliflozin propanediol (FARXIGA) 10 MG TABS tablet, Take 1 tablet (10 mg total) by mouth daily., Disp: 90 tablet, Rfl: 4   escitalopram (LEXAPRO) 10 MG tablet, Take 1 tablet (10 mg total) by mouth daily., Disp: 90 tablet, Rfl: 3   Insulin Glargine (BASAGLAR KWIKPEN) 100 UNIT/ML, DIAL 20 UNITS AND INJECT UNDER THE SKIN DAILY. MAXIMUM DAILY DOSE 20 UNITS., Disp: 30 mL, Rfl: 0   Lactobacillus-Inulin (CULTURELLE DIGESTIVE HEALTH) CAPS, Take 1 capsule daily for digestive health, Disp: 90 capsule, Rfl: 3 Social History   Socioeconomic History   Marital status: Widowed    Spouse name: Not on file   Number of children: 1   Years of education: 14   Highest education level: Associate degree: occupational, Scientist, product/process development, or vocational program  Occupational History   Occupation: Retired    Comment: [employer]  Tobacco Use   Smoking status: Every Day    Types: Cigars   Smokeless tobacco: Never   Tobacco comments:    4 small cigars a day  Vaping Use   Vaping status: Former   Start date: 02/16/2010   Quit date: 02/17/2011   Devices: unsure  Substance and Sexual Activity   Alcohol use: Not Currently   Drug use: No   Sexual activity: Not Currently  Other Topics Concern  Not on file  Social History Narrative   Her daughter lives with her and helps with bills and ride if she needs   Patient is pretty independent, but sometimes has excessive pain and needs assistance   Social Drivers of Health   Financial Resource Strain: Low Risk  (08/17/2022)   Overall Financial Resource Strain (CARDIA)    Difficulty of Paying Living Expenses: Not hard at all  Food Insecurity: No Food Insecurity (08/13/2021)   Hunger Vital Sign    Worried About Running Out of Food in the Last Year: Never true    Ran Out of Food in the Last Year: Never true  Transportation Needs: No Transportation Needs (08/13/2021)   PRAPARE -  Administrator, Civil Service (Medical): No    Lack of Transportation (Non-Medical): No  Physical Activity: Insufficiently Active (08/17/2022)   Exercise Vital Sign    Days of Exercise per Week: 2 days    Minutes of Exercise per Session: 30 min  Stress: No Stress Concern Present (08/17/2022)   [employer] of Occupational Health - Occupational Stress Questionnaire    Feeling of Stress : Not at all  Social Connections: Moderately Isolated (08/17/2022)   Social Connection and Isolation Panel [NHANES]    Frequency of Communication with Friends and Family: More than three times a week    Frequency of Social Gatherings with Friends and Family: More than three times a week    Attends Religious Services: 1 to 4 times per year    Active Member of [employer] or Organizations: No    Attends Banker Meetings: Never    Marital Status: Widowed  Intimate Partner Violence: Not At Risk (08/13/2021)   Humiliation, Afraid, Rape, and Kick questionnaire    Fear of Current or Ex-Partner: No    Emotionally Abused: No    Physically Abused: No    Sexually Abused: No   Family History  Problem Relation Age of Onset   Cervical cancer Mother 47       treated with radiation   Pancreatic cancer Mother 10   Stroke Father    Pulmonary fibrosis Brother    Alzheimer's disease Brother    Stomach cancer Brother        dx 30s   Diabetes Brother    Lung cancer Maternal Grandmother    Stroke Paternal Grandfather    Uterine cancer Maternal Aunt        dx 36s   Kidney cancer Maternal Uncle        dx 62s   Colon cancer Maternal Uncle        dx 66s   Breast cancer Cousin        maternal cousin dx 45    Objective: Office vital signs reviewed. There were no vitals taken for this visit.  Physical Examination:  General: Awake, alert, *** nourished, No acute distress HEENT: Normal    Neck: No masses palpated. No lymphadenopathy    Ears: Tympanic membranes intact, normal light reflex, no  erythema, no bulging    Eyes: PERRLA, extraocular membranes intact, sclera ***    Nose: nasal turbinates moist, *** nasal discharge    Throat: moist mucus membranes, no erythema, *** tonsillar exudate.  Airway is patent Cardio: regular rate and rhythm, S1S2 heard, no murmurs appreciated Pulm: clear to auscultation bilaterally, no wheezes, rhonchi or rales; normal work of breathing on room air GI: soft, non-tender, non-distended, bowel sounds present x4, no hepatomegaly, no splenomegaly, no masses GU: external  vaginal tissue ***, cervix ***, *** punctate lesions on cervix appreciated, *** discharge from cervical os, *** bleeding, *** cervical motion tenderness, *** abdominal/ adnexal masses Extremities: warm, well perfused, No edema, cyanosis or clubbing; +*** pulses bilaterally MSK: *** gait and *** station Skin: dry; intact; no rashes or lesions Neuro: *** Strength and light touch sensation grossly intact, *** DTRs ***/4  Diabetic Foot Exam - Simple   No data filed      Assessment/ Plan: 78 y.o. female   Type 2 diabetes mellitus with other specified complication, with long-term current use of insulin (HCC) - Plan: CMP14+EGFR, Lipid Panel, Bayer DCA Hb A1c Waived  Hyperlipidemia associated with type 2 diabetes mellitus (HCC) - Plan: CMP14+EGFR, Lipid Panel  Endometrial adenocarcinoma (HCC) - Plan: CBC   ***  Debra Eichenberger Hulen Skains, DO [facility] [phone]

## 2023-04-15 ENCOUNTER — Telehealth: Payer: Self-pay | Admitting: Pharmacist

## 2023-04-15 ENCOUNTER — Encounter: Payer: Self-pay | Admitting: Family Medicine

## 2023-04-15 LAB — CMP14+EGFR
ALT: 16 [IU]/L (ref 0–32)
AST: 14 [IU]/L (ref 0–40)
Albumin: 4.1 g/dL (ref 3.8–4.8)
Alkaline Phosphatase: 112 [IU]/L (ref 44–121)
BUN/Creatinine Ratio: 15 (ref 12–28)
BUN: 12 mg/dL (ref 8–27)
Bilirubin Total: 0.4 mg/dL (ref 0.0–1.2)
CO2: 21 mmol/L (ref 20–29)
Calcium: 9.3 mg/dL (ref 8.7–10.3)
Chloride: 104 mmol/L (ref 96–106)
Creatinine, Ser: 0.8 mg/dL (ref 0.57–1.00)
Globulin, Total: 2.7 g/dL (ref 1.5–4.5)
Glucose: 259 mg/dL — ABNORMAL HIGH (ref 70–99)
Potassium: 4 mmol/L (ref 3.5–5.2)
Sodium: 139 mmol/L (ref 134–144)
Total Protein: 6.8 g/dL (ref 6.0–8.5)
eGFR: 76 mL/min/{1.73_m2} (ref >59)

## 2023-04-15 LAB — LIPID PANEL
Chol/HDL Ratio: 2.6 {ratio} (ref 0.0–4.4)
Cholesterol, Total: 145 mg/dL (ref 100–199)
HDL: 55 mg/dL (ref >39)
LDL Chol Calc (NIH): 66 mg/dL (ref 0–99)
Triglycerides: 138 mg/dL (ref 0–149)
VLDL Cholesterol Cal: 24 mg/dL (ref 5–40)

## 2023-04-15 LAB — CBC
Hematocrit: 47.9 % — ABNORMAL HIGH (ref 34.0–46.6)
Hemoglobin: 15.6 g/dL (ref 11.1–15.9)
MCH: 28.7 pg (ref 26.6–33.0)
MCHC: 32.6 g/dL (ref 31.5–35.7)
MCV: 88 fL (ref 79–97)
Platelets: 182 10*3/uL (ref 150–450)
RBC: 5.44 x10E6/uL — ABNORMAL HIGH (ref 3.77–5.28)
RDW: 13.9 % (ref 11.7–15.4)
WBC: 10.1 10*3/uL (ref 3.4–10.8)

## 2023-04-15 NOTE — Telephone Encounter (Signed)
 Would you mind seeing which test strips may be covered?  Accucheck guide versus one touch verio? It may have to go through part B, but just trying to avoid a call to insurance (patient unable to) I tried to pull on to see "formulary" and all say "not reimbursable" No rush! We have an appt scheduled with her in a few weeks. Thanks!!

## 2023-04-19 ENCOUNTER — Other Ambulatory Visit (HOSPITAL_COMMUNITY): Payer: Self-pay

## 2023-04-25 DIAGNOSIS — Z1211 Encounter for screening for malignant neoplasm of colon: Secondary | ICD-10-CM | POA: Diagnosis not present

## 2023-05-01 LAB — COLOGUARD: COLOGUARD: NEGATIVE

## 2023-05-04 ENCOUNTER — Other Ambulatory Visit (INDEPENDENT_AMBULATORY_CARE_PROVIDER_SITE_OTHER): Payer: Medicare HMO | Admitting: Pharmacist

## 2023-05-04 DIAGNOSIS — E119 Type 2 diabetes mellitus without complications: Secondary | ICD-10-CM

## 2023-05-04 MED ORDER — DAPAGLIFLOZIN PROPANEDIOL 10 MG PO TABS: 10.0000 mg | ORAL_TABLET | Freq: Every day | ORAL | 4 refills | Status: DC

## 2023-05-04 MED ORDER — BASAGLAR KWIKPEN 100 UNIT/ML ~~LOC~~ SOPN: 24.0000 [IU] | PEN_INJECTOR | Freq: Every day | SUBCUTANEOUS | 5 refills | Status: DC

## 2023-05-04 NOTE — Progress Notes (Unsigned)
   05/04/2023 Name: Debra Miranda MRN: [id number] DOB: [birth year]  No chief complaint on file.   Debra Miranda is a 78 y.o. year old female who presented for a telephone visit.   They were referred to the pharmacist by their PCP for assistance in managing diabetes.    Subjective:  Care Team: Primary Care Provider: Raliegh Ip, DO {careteamprovider:[id number]}  Medication Access/Adherence  Current Pharmacy:  [facility] [phone] - [place], [place] - [address] [place] [address] Phone: [phone] Fax: [phone]  [facility] - [place], [place] - [address] [place] [address] Phone: [phone] Fax: [phone]  [facility] - [place], [place] - [address] [place] [address] Phone: [phone] Fax: [phone]   Patient reports affordability concerns with their medications: No  Patient reports access/transportation concerns to their pharmacy: No  Patient reports adherence concerns with their medications:  No     Diabetes:  Current medications:  basaglar 24 units (patient increased 22 units at bedtime--felt "bad" when she gave 24 untis); farxiga Medications tried in the past: jardiance, metformin, januvia  Current glucose readings: *** Not currently testing; unsure of readings [employer]; needs reader  Current meal patterns:  - Breakfast: *** - Lunch *** - Supper *** - Snacks *** - Drinks ***  Current physical activity: ***  Current medication access support: ***   Objective:  Lab Results  Component Value Date   HGBA1C 8.3 (H) 04/14/2023    Lab Results  Component Value Date   CREATININE 0.80 04/14/2023   BUN 12 04/14/2023   NA 139 04/14/2023   K 4.0 04/14/2023   CL 104 04/14/2023   CO2 21 04/14/2023    Lab Results  Component Value Date   CHOL 145 04/14/2023   HDL 55 04/14/2023   LDLCALC 66 04/14/2023   LDLDIRECT 74  10/16/2020   TRIG 138 04/14/2023   CHOLHDL 2.6 04/14/2023    Medications Reviewed Today   Medications were not reviewed in this encounter       Assessment/Plan:   {Pharmacy A/P Choices:26421}  Follow Up Plan: ***  ***

## 2023-05-13 DIAGNOSIS — E1165 Type 2 diabetes mellitus with hyperglycemia: Secondary | ICD-10-CM | POA: Diagnosis not present

## 2023-05-17 ENCOUNTER — Telehealth: Payer: Self-pay | Admitting: Radiation Oncology

## 2023-05-17 ENCOUNTER — Ambulatory Visit
Admission: RE | Admit: 2023-05-17 | Discharge: 2023-05-17 | Disposition: A | Discharge status: Home / Self Care | Payer: Self-pay | Source: Ambulatory Visit | Attending: Radiation Oncology | Admitting: Radiation Oncology
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
## 2023-05-26 NOTE — Progress Notes (Signed)
 Radiation Oncology         [phone] ________________________________  Name: Debra Miranda MRN: [id number]  Date: 05/27/2023  DOB: 24-Dec-1945  Follow-Up Visit Note  CC: Raliegh Ip, DO  Delynn Flavin M, DO  No diagnosis found.  Diagnosis: FIGO grade 2, Stage IA, Endometrial adenocarcinoma (HCC)   Interval Since Last Radiation: 5 years and 24 days   04/05/2018, 04/12/2018, 04/19/2018, 04/26/2018, 05/04/2018: Vagina, Iridium HDR, 3 cm cylinder, tx length of 3 cm / 30 Gy in 5 fractions.   Narrative:  The patient returns today for a routine annual follow-up visit. She was last seen here for follow-up on 05/11/22.    Since her last visit, she most recently followed up with Dr. Pricilla Holm on 11/12/22. During which time, the patient reported having minimal vulvar irritation (related to wearing depends), and ongoing intermittent constipation and diarrhea (which have both been at baseline for her since undergoing surgery in 2021). GU exam performed at that time confirmed mild erythema to vulvar area bilaterally, along with expected radiation changes to the vaginal cuff. She was otherwise noted to be doing well and NED on examination.   Pertinent imaging performed in the interval since her last visit includes a bone mineral density study on 09/09/22 which showed findings consistent with osteopenia.   No other significant oncologic interval history since the patient was last seen for follow-up.     Allergies:  is allergic to augmentin [amoxicillin-pot clavulanate] and latex.  Meds: Current Outpatient Medications  Medication Sig Dispense Refill   atorvastatin (LIPITOR) 20 MG tablet Take 1 tablet (20 mg total) by mouth daily. 90 tablet 3   Calcium-Vitamin D 600-5 MG-MCG TABS Take by mouth.     Cholecalciferol (VITAMIN D3) 50 MCG (2000 UT) TABS Take by mouth.     dapagliflozin propanediol (FARXIGA) 10 MG TABS tablet Take 1 tablet (10 mg total) by mouth daily. 90 tablet 4    escitalopram (LEXAPRO) 10 MG tablet Take 1 tablet (10 mg total) by mouth daily. 90 tablet 3   Insulin Glargine (BASAGLAR KWIKPEN) 100 UNIT/ML Inject 24 Units into the skin at bedtime. 45 mL 5   Lactobacillus-Inulin (CULTURELLE DIGESTIVE HEALTH) CAPS Take 1 capsule daily for digestive health 90 capsule 3   No current facility-administered medications for this encounter.    Physical Findings: The patient is in no acute distress. Patient is alert and oriented.  vitals were not taken for this visit. .  No significant changes. Lungs are clear to auscultation bilaterally. Heart has regular rate and rhythm. No palpable cervical, supraclavicular, or axillary adenopathy. Abdomen soft, non-tender, normal bowel sounds.  On pelvic examination the external genitalia were unremarkable. A speculum exam was performed. There are no mucosal lesions noted in the vaginal vault. A Pap smear was obtained of the proximal vagina. On bimanual and rectovaginal examination there were no pelvic masses appreciated. ***   Lab Findings: Lab Results  Component Value Date   WBC 10.1 04/14/2023   HGB 15.6 04/14/2023   HCT 47.9 (H) 04/14/2023   MCV 88 04/14/2023   PLT 182 04/14/2023    Radiographic Findings: No results found.  Impression: FIGO grade 2, Stage IA, Endometrial adenocarcinoma (HCC)   The patient is recovering from the effects of radiation.  ***  Plan:  ***   *** minutes of total time was spent for this patient encounter, including preparation, face-to-face counseling with the patient and coordination of care, physical exam, and documentation of the encounter. ____________________________________  Fayrene Fearing  Carley Hammed, PhD, MD  This document serves as a record of services personally performed by Antony Blackbird, MD. It was created on his behalf by Herbie Saxon, a trained medical scribe. The creation of this record is based on the scribe's personal observations and the provider's statements to them. This  document has been checked and approved by the attending provider.

## 2023-05-27 ENCOUNTER — Ambulatory Visit
Admission: RE | Admit: 2023-05-27 | Discharge: 2023-05-27 | Disposition: A | Discharge status: Home / Self Care | Source: Ambulatory Visit | Attending: Radiation Oncology | Admitting: Radiation Oncology

## 2023-05-27 ENCOUNTER — Encounter: Payer: Self-pay | Admitting: Radiation Oncology

## 2023-05-27 VITALS — BP 127/69 | HR 82 | Temp 97.5°F | Resp 18 | Ht 67.0 in | Wt 175.4 lb

## 2023-05-27 DIAGNOSIS — Z7984 Long term (current) use of oral hypoglycemic drugs: Secondary | ICD-10-CM | POA: Diagnosis not present

## 2023-05-27 DIAGNOSIS — M8580 Other specified disorders of bone density and structure, unspecified site: Secondary | ICD-10-CM | POA: Diagnosis not present

## 2023-05-27 DIAGNOSIS — K5900 Constipation, unspecified: Secondary | ICD-10-CM | POA: Insufficient documentation

## 2023-05-27 DIAGNOSIS — Z79899 Other long term (current) drug therapy: Secondary | ICD-10-CM | POA: Diagnosis not present

## 2023-05-27 DIAGNOSIS — C541 Malignant neoplasm of endometrium: Secondary | ICD-10-CM

## 2023-05-27 DIAGNOSIS — Z8542 Personal history of malignant neoplasm of other parts of uterus: Secondary | ICD-10-CM | POA: Diagnosis not present

## 2023-05-27 DIAGNOSIS — R197 Diarrhea, unspecified: Secondary | ICD-10-CM | POA: Insufficient documentation

## 2023-05-27 DIAGNOSIS — Z923 Personal history of irradiation: Secondary | ICD-10-CM | POA: Diagnosis not present

## 2023-05-27 NOTE — Progress Notes (Signed)
 Debra Miranda is here today for follow up post radiation to the pelvic.  They completed their radiation on: 05/04/2018  Does the patient complain of any of the following:  Pain: Denies Abdominal bloating: Yes, she reports when she has to catherize herself. Diarrhea/Constipation: Denies Nausea/Vomiting: Denies Vaginal Discharge: Denies Blood in Urine or Stool: Denies Urinary Issues (dysuria/incomplete emptying/ incontinence/ increased frequency/urgency): Denies Does patient report using vaginal dilator 2-3 times a week and/or sexually active 2-3 weeks: She reports using the dilators two weeks ago.  Post radiation skin changes: Denies    BP 127/69 (BP Location: Left Arm, Patient Position: Sitting)   Pulse 82   Temp (!) 97.5 F (36.4 C) (Temporal)   Resp 18   Ht 5\' 7"  (1.702 m)   Wt 175 lb 6 oz (79.5 kg)   SpO2 97%   BMI 27.47 kg/m

## 2023-06-15 ENCOUNTER — Telehealth: Payer: Self-pay | Admitting: Family Medicine

## 2023-06-15 NOTE — Telephone Encounter (Unsigned)
 Copied from CRM [id number]. Topic: Clinical - Medication Question >> Jun 15, 2023  3:50 PM Carlatta H wrote: Reason for CRM: Patient would like a call from the nurse regarding a prescription// She would not give any details

## 2023-06-15 NOTE — Telephone Encounter (Deleted)
  Communication  Reason for CRM: Agent called asking to speak with office manager please follow up [phone]

## 2023-06-16 MED ORDER — INSULIN PEN NEEDLE 32G X 6 MM MISC
2 refills | Status: AC
Start: 2023-06-16 — End: ?

## 2023-06-16 NOTE — Telephone Encounter (Signed)
 Returned call to patient she is just needing [facility] [place]

## 2023-06-16 NOTE — Telephone Encounter (Signed)
 Needles sent to patients pharmacy provided.

## 2023-06-16 NOTE — Telephone Encounter (Signed)
 Left message to let us  know what she was needing.

## 2023-06-18 ENCOUNTER — Telehealth: Payer: Self-pay

## 2023-06-18 ENCOUNTER — Telehealth: Payer: Self-pay | Admitting: Family Medicine

## 2023-06-18 NOTE — Telephone Encounter (Signed)
 Duplicate

## 2023-06-18 NOTE — Telephone Encounter (Unsigned)
 Copied from CRM [id number]. Topic: General - Other >> Jun 18, 2023 11:40 AM Carrielelia G wrote: Reason for CRM: patient returning call called office kelci to call patient back, patient mentioned if possible can you have Odilia Bennett to call her back.

## 2023-06-18 NOTE — Telephone Encounter (Signed)
 Copied from CRM [id number]. Topic: General - Other >> Jun 18, 2023 10:29 AM Lotus Round B wrote: Reason for CRM: pt called in stating that she has been calling to get in touch with Dr.Gottschalks nurse. She really needs to speak with her . No details given .

## 2023-06-18 NOTE — Telephone Encounter (Signed)
 Pt returning missed call from nurse. Please call pt back when able.

## 2023-06-18 NOTE — Telephone Encounter (Signed)
 Left vm for cb

## 2023-06-21 NOTE — Telephone Encounter (Signed)
 Patient return call. ?

## 2023-06-22 NOTE — Telephone Encounter (Signed)
 Spoke with pt , states she just needed refills on her insulin  needles , she got them yesterday

## 2023-07-13 ENCOUNTER — Ambulatory Visit: Payer: Medicare HMO | Admitting: Family Medicine

## 2023-07-13 NOTE — Progress Notes (Deleted)
 Subjective: CC:DM PCP: Eliodoro Guerin, DO [id number] Debra Miranda is a 78 y.o. female presenting to clinic today for:  1. Type 2 Diabetes with hyperlipidemia:  Glucometer:***.   High at home: ***; Low at home: ***, Taking medication(s): ***,.  Diabetes Health Maintenance Due  Topic Date Due   FOOT EXAM  09/09/2023   HEMOGLOBIN A1C  10/12/2023   OPHTHALMOLOGY EXAM  01/04/2024    Last A1c:  Lab Results  Component Value Date   HGBA1C 8.3 (H) 04/14/2023    ROS: ***dizziness, LOC, polyuria, polydipsia, unintended weight loss/gain, foot ulcerations, numbness or tingling in extremities, shortness of breath or chest pain.   ROS: Per HPI  Allergies  Allergen Reactions   Augmentin  [Amoxicillin -Pot Clavulanate] Nausea And Vomiting   Latex Itching   Past Medical History:  Diagnosis Date   Arthritis    right hand   Cataract    bilateral   Depression    many years ago, no meds   Diabetes mellitus without complication (HCC)    type 2   Endometrial adenocarcinoma (HCC) 01/05/2018   treated with HDR   Environmental allergies    Family history of breast cancer    Family history of kidney cancer    Family history of pancreatic cancer    Family history of stomach cancer    Family history of uterine cancer    Hemorrhoids    History of radiation therapy 04/05/18,02/25,3/3,3/10,3/18   04/05/2018, 04/12/2018, 04/19/2018, 04/26/2018, 05/04/2018: Vagina, Iridium HDR, 3 cm cylinder, tx length of 3 cm / 30 Gy in 5 fractions.. Dr. Retta Caster   Incarcerated umbilical hernia    Nodular basal cell carcinoma (BCC) 08/2017   LEFT LOWER LEG above foot   Orthostatic hypotension    Patient is unaware of this diagnosis   Strangulated umbilical hernia     Current Outpatient Medications:    atorvastatin  (LIPITOR) 20 MG tablet, Take 1 tablet (20 mg total) by mouth daily., Disp: 90 tablet, Rfl: 3   Calcium -Vitamin D  600-5 MG-MCG TABS, Take by mouth., Disp: , Rfl:    Cholecalciferol  (VITAMIN D3) 50 MCG (2000 UT) TABS, Take by mouth., Disp: , Rfl:    dapagliflozin  propanediol (FARXIGA ) 10 MG TABS tablet, Take 1 tablet (10 mg total) by mouth daily., Disp: 90 tablet, Rfl: 4   escitalopram  (LEXAPRO ) 10 MG tablet, Take 1 tablet (10 mg total) by mouth daily., Disp: 90 tablet, Rfl: 3   Insulin  Glargine (BASAGLAR  KWIKPEN) 100 UNIT/ML, Inject 24 Units into the skin at bedtime., Disp: 45 mL, Rfl: 5   Insulin  Pen Needle 32G X 6 MM MISC, Use with insulin , Disp: 300 each, Rfl: 2   Lactobacillus-Inulin (CULTURELLE DIGESTIVE HEALTH) CAPS, Take 1 capsule daily for digestive health, Disp: 90 capsule, Rfl: 3 Social History   Socioeconomic History   Marital status: Widowed    Spouse name: Not on file   Number of children: 1   Years of education: 14   Highest education level: Associate degree: occupational, Scientist, product/process development, or vocational program  Occupational History   Occupation: Retired    Comment: [employer]  Tobacco Use   Smoking status: Every Day    Types: Cigars   Smokeless tobacco: Never   Tobacco comments:    4 small cigars a day  Vaping Use   Vaping status: Former   Start date: 02/16/2010   Quit date: 02/17/2011   Devices: unsure  Substance and Sexual Activity   Alcohol use: Not Currently   Drug use:  No   Sexual activity: Not Currently  Other Topics Concern   Not on file  Social History Narrative   Her daughter lives with her and helps with bills and ride if she needs   Patient is pretty independent, but sometimes has excessive pain and needs assistance   Social Drivers of Health   Financial Resource Strain: Low Risk  (08/17/2022)   Overall Financial Resource Strain (CARDIA)    Difficulty of Paying Living Expenses: Not hard at all  Food Insecurity: No Food Insecurity (08/13/2021)   Hunger Vital Sign    Worried About Running Out of Food in the Last Year: Never true    Ran Out of Food in the Last Year: Never true  Transportation Needs: No Transportation Needs  (08/13/2021)   PRAPARE - Administrator, Civil Service (Medical): No    Lack of Transportation (Non-Medical): No  Physical Activity: Insufficiently Active (08/17/2022)   Exercise Vital Sign    Days of Exercise per Week: 2 days    Minutes of Exercise per Session: 30 min  Stress: No Stress Concern Present (08/17/2022)   [employer] of Occupational Health - Occupational Stress Questionnaire    Feeling of Stress : Not at all  Social Connections: Moderately Isolated (08/17/2022)   Social Connection and Isolation Panel [NHANES]    Frequency of Communication with Friends and Family: More than three times a week    Frequency of Social Gatherings with Friends and Family: More than three times a week    Attends Religious Services: 1 to 4 times per year    Active Member of [employer] or Organizations: No    Attends Banker Meetings: Never    Marital Status: Widowed  Intimate Partner Violence: Not At Risk (08/13/2021)   Humiliation, Afraid, Rape, and Kick questionnaire    Fear of Current or Ex-Partner: No    Emotionally Abused: No    Physically Abused: No    Sexually Abused: No   Family History  Problem Relation Age of Onset   Cervical cancer Mother 81       treated with radiation   Pancreatic cancer Mother 63   Stroke Father    Pulmonary fibrosis Brother    Alzheimer's disease Brother    Stomach cancer Brother        dx 74s   Diabetes Brother    Lung cancer Maternal Grandmother    Stroke Paternal Grandfather    Uterine cancer Maternal Aunt        dx 60s   Kidney cancer Maternal Uncle        dx 23s   Colon cancer Maternal Uncle        dx 30s   Breast cancer Cousin        maternal cousin dx 51    Objective: Office vital signs reviewed. There were no vitals taken for this visit.  Physical Examination:  General: Awake, alert, *** nourished, No acute distress HEENT: Normal    Neck: No masses palpated. No lymphadenopathy    Ears: Tympanic membranes intact,  normal light reflex, no erythema, no bulging    Eyes: PERRLA, extraocular membranes intact, sclera ***    Nose: nasal turbinates moist, *** nasal discharge    Throat: moist mucus membranes, no erythema, *** tonsillar exudate.  Airway is patent Cardio: regular rate and rhythm, S1S2 heard, no murmurs appreciated Pulm: clear to auscultation bilaterally, no wheezes, rhonchi or rales; normal work of breathing on room air GI: soft, non-tender,  non-distended, bowel sounds present x4, no hepatomegaly, no splenomegaly, no masses GU: external vaginal tissue ***, cervix ***, *** punctate lesions on cervix appreciated, *** discharge from cervical os, *** bleeding, *** cervical motion tenderness, *** abdominal/ adnexal masses Extremities: warm, well perfused, No edema, cyanosis or clubbing; +*** pulses bilaterally MSK: *** gait and *** station Skin: dry; intact; no rashes or lesions Neuro: *** Strength and light touch sensation grossly intact, *** DTRs ***/4  Diabetic Foot Exam - Simple   No data filed      Assessment/ Plan: 78 y.o. female   Type 2 diabetes mellitus with other specified complication, with long-term current use of insulin  (HCC)  Hyperlipidemia associated with type 2 diabetes mellitus (HCC)   ***  Mico Spark Bambi Bonine, DO [facility] [phone]

## 2023-07-15 ENCOUNTER — Encounter: Payer: Self-pay | Admitting: Family Medicine

## 2023-08-18 ENCOUNTER — Ambulatory Visit: Payer: Medicare PPO

## 2023-08-18 VITALS — BP 121/75 | HR 80 | Ht 67.0 in | Wt 176.0 lb

## 2023-08-18 DIAGNOSIS — Z0000 Encounter for general adult medical examination without abnormal findings: Secondary | ICD-10-CM | POA: Diagnosis not present

## 2023-08-18 NOTE — Progress Notes (Signed)
 Subjective:   Debra Miranda is a 78 y.o. who presents for a Medicare Wellness preventive visit.  As a reminder, Annual Wellness Visits don't include a physical exam, and some assessments may be limited, especially if this visit is performed virtually. We may recommend an in-person follow-up visit with your provider if needed.  Visit Complete: Virtual I connected with  Debra Miranda on 08/18/23 by a audio enabled telemedicine application and verified that I am speaking with the correct person using two identifiers.  Patient Location: Home  Provider Location: Home Office  I discussed the limitations of evaluation and management by telemedicine. The patient expressed understanding and agreed to proceed.  Vital Signs: Because this visit was a virtual/telehealth visit, some criteria may be missing or patient reported. Any vitals not documented were not able to be obtained and vitals that have been documented are patient reported.  VideoDeclined- This patient declined Librarian, academic. Therefore the visit was completed with audio only.  Persons Participating in Visit: Patient.  AWV Questionnaire: No: Patient Medicare AWV questionnaire was not completed prior to this visit.  Cardiac Risk Factors include: advanced age (>79men, >51 women);diabetes mellitus;dyslipidemia     Objective:    Today's Vitals   08/18/23 1253  BP: 121/75  Pulse: 80  Weight: 176 lb (79.8 kg)  Height: 5' 7 (1.702 m)   Body mass index is 27.57 kg/m.     08/18/2023   11:07 AM 05/27/2023    3:41 PM 08/17/2022    1:26 PM 05/11/2022    2:55 PM 08/13/2021    3:44 PM 05/12/2021    3:45 PM 05/13/2020    3:16 PM  Advanced Directives  Does Patient Have a Medical Advance Directive? Yes Yes Yes Yes Yes Yes Yes  Type of Estate agent of [place];Living will Living will;Healthcare Power of [employer] Power of [place];Living will  Healthcare Power of  [place];Living will Healthcare Power of [place];Living will Living will  Does patient want to make changes to medical advance directive?   No - Patient declined No - Patient declined  No - Patient declined No - Patient declined  Copy of Healthcare Power of Attorney in Chart? Yes - validated most recent copy scanned in chart (See row information)  Yes - validated most recent copy scanned in chart (See row information)  Yes - validated most recent copy scanned in chart (See row information) Yes - validated most recent copy scanned in chart (See row information)     Current Medications (verified) Outpatient Encounter Medications as of 08/18/2023  Medication Sig   atorvastatin  (LIPITOR) 20 MG tablet Take 1 tablet (20 mg total) by mouth daily.   Calcium -Vitamin D  600-5 MG-MCG TABS Take by mouth.   Cholecalciferol (VITAMIN D3) 50 MCG (2000 UT) TABS Take by mouth.   dapagliflozin  propanediol (FARXIGA ) 10 MG TABS tablet Take 1 tablet (10 mg total) by mouth daily.   escitalopram  (LEXAPRO ) 10 MG tablet Take 1 tablet (10 mg total) by mouth daily.   Insulin  Glargine (BASAGLAR  KWIKPEN) 100 UNIT/ML Inject 24 Units into the skin at bedtime.   Insulin  Pen Needle 32G X 6 MM MISC Use with insulin    Lactobacillus-Inulin (CULTURELLE DIGESTIVE HEALTH) CAPS Take 1 capsule daily for digestive health   No facility-administered encounter medications on file as of 08/18/2023.    Allergies (verified) Augmentin  [amoxicillin -pot clavulanate] and Latex   History: Past Medical History:  Diagnosis Date   Arthritis    right hand  Cataract    bilateral   Depression    many years ago, no meds   Diabetes mellitus without complication (HCC)    type 2   Endometrial adenocarcinoma (HCC) 01/05/2018   treated with HDR   Environmental allergies    Family history of breast cancer    Family history of kidney cancer    Family history of pancreatic cancer    Family history of stomach cancer    Family history of uterine  cancer    Hemorrhoids    History of radiation therapy 04/05/18,02/25,3/3,3/10,3/18   04/05/2018, 04/12/2018, 04/19/2018, 04/26/2018, 05/04/2018: Vagina, Iridium HDR, 3 cm cylinder, tx length of 3 cm / 30 Gy in 5 fractions.. Dr. Lynwood Nasuti   Incarcerated umbilical hernia    Nodular basal cell carcinoma (BCC) 08/2017   LEFT LOWER LEG above foot   Orthostatic hypotension    Patient is unaware of this diagnosis   Strangulated umbilical hernia    Past Surgical History:  Procedure Laterality Date   ABDOMINAL HYSTERECTOMY     Dr. Anitra 02-08-18    BOWEL RESECTION N/A 06/01/2019   Procedure: SMALL BOWEL RESECTION;  Surgeon: Kallie Manuelita BROCKS, MD;  Location: [facility];  Service: General;  Laterality: N/A;   CATARACT EXTRACTION     RIGHT EYE   HYSTEROSCOPY WITH D & C N/A 01/05/2018   Procedure: DILATATION AND CURETTAGE/HYSTEROSCOPY;  Surgeon: Jayne Vonn DEL, MD;  Location: [facility];  Service: Gynecology;  Laterality: N/A;   LUMBAR DISC SURGERY  2006   OMENTECTOMY N/A 06/01/2019   Procedure: OMENTECTOMY;  Surgeon: Kallie Manuelita BROCKS, MD;  Location: [facility];  Service: General;  Laterality: N/A;   POLYPECTOMY N/A 01/05/2018   Procedure: ENDOMETRIAL POLYPECTOMY;  Surgeon: Jayne Vonn DEL, MD;  Location: [facility];  Service: Gynecology;  Laterality: N/A;   ROBOTIC ASSISTED TOTAL HYSTERECTOMY WITH BILATERAL SALPINGO OOPHERECTOMY Bilateral 02/08/2018   Procedure: XI ROBOTIC ASSISTED TOTAL HYSTERECTOMY WITH BILATERAL SALPINGO OOPHORECTOMY;  Surgeon: Anitra Freddy NOVAK, MD;  Location: [facility];  Service: Gynecology;  Laterality: Bilateral;   SENTINEL NODE BIOPSY Bilateral 02/08/2018   Procedure: SENTINEL NODE BIOPSY AND PELVIC LYMPH NODE SAMPLING;  Surgeon: Anitra Freddy NOVAK, MD;  Location: [facility];  Service: Gynecology;  Laterality: Bilateral;   TONSILLECTOMY     as a child   UMBILICAL HERNIA REPAIR N/A 06/01/2019   Procedure: HERNIA REPAIR UMBILICAL ADULT;  Surgeon: Kallie Manuelita BROCKS, MD;  Location: [facility];   Service: General;  Laterality: N/A;   Family History  Problem Relation Age of Onset   Cervical cancer Mother 24       treated with radiation   Pancreatic cancer Mother 46   Stroke Father    Pulmonary fibrosis Brother    Alzheimer's disease Brother    Stomach cancer Brother        dx 34s   Diabetes Brother    Lung cancer Maternal Grandmother    Stroke Paternal Grandfather    Uterine cancer Maternal Aunt        dx 55s   Kidney cancer Maternal Uncle        dx 19s   Colon cancer Maternal Uncle        dx 25s   Breast cancer Cousin        maternal cousin dx 43   Social History   Socioeconomic History   Marital status: Widowed    Spouse name: Not on file   Number of children: 1   Years of education: 2  Highest education level: Associate degree: occupational, Scientist, product/process development, or vocational program  Occupational History   Occupation: Retired    Comment: [employer]  Tobacco Use   Smoking status: Every Day    Types: Cigars   Smokeless tobacco: Never   Tobacco comments:    4 small cigars a day  Vaping Use   Vaping status: Former   Start date: 02/16/2010   Quit date: 02/17/2011   Devices: unsure  Substance and Sexual Activity   Alcohol use: Not Currently   Drug use: No   Sexual activity: Not Currently  Other Topics Concern   Not on file  Social History Narrative   Her daughter lives with her and helps with bills and ride if she needs   Patient is pretty independent, but sometimes has excessive pain and needs assistance   Social Drivers of Health   Financial Resource Strain: Low Risk  (08/18/2023)   Overall Financial Resource Strain (CARDIA)    Difficulty of Paying Living Expenses: Not hard at all  Food Insecurity: No Food Insecurity (08/18/2023)   Hunger Vital Sign    Worried About Running Out of Food in the Last Year: Never true    Ran Out of Food in the Last Year: Never true  Transportation Needs: No Transportation Needs (08/18/2023)   PRAPARE - Doctor, general practice (Medical): No    Lack of Transportation (Non-Medical): No  Physical Activity: Insufficiently Active (08/18/2023)   Exercise Vital Sign    Days of Exercise per Week: 2 days    Minutes of Exercise per Session: 30 min  Stress: No Stress Concern Present (08/18/2023)   [employer] of Occupational Health - Occupational Stress Questionnaire    Feeling of Stress: Not at all  Social Connections: Moderately Isolated (08/18/2023)   Social Connection and Isolation Panel    Frequency of Communication with Friends and Family: More than three times a week    Frequency of Social Gatherings with Friends and Family: More than three times a week    Attends Religious Services: More than 4 times per year    Active Member of [employer] or Organizations: No    Attends Banker Meetings: Never    Marital Status: Widowed    Tobacco Counseling Ready to quit: No Counseling given: Yes Tobacco comments: 4 small cigars a day    Clinical Intake:  Pre-visit preparation completed: Yes  Pain : No/denies pain     BMI - recorded: 27.57 Nutritional Status: BMI 25 -29 Overweight Nutritional Risks: None Diabetes: Yes CBG done?: No  Lab Results  Component Value Date   HGBA1C 8.3 (H) 04/14/2023   HGBA1C 7.5 (H) 01/11/2023   HGBA1C 7.1 (H) 09/09/2022     How often do you need to have someone help you when you read instructions, pamphlets, or other written materials from your doctor or pharmacy?: 1 - Never  Interpreter Needed?: No  Comments: pt c/o having no way check her Blood Sugar/pt needs new supply and testing meter Information entered by :: alia t/cma   Activities of Daily Living     08/18/2023   11:07 AM  In your present state of health, do you have any difficulty performing the following activities:  Hearing? 1  Vision? 0  Difficulty concentrating or making decisions? 1  Walking or climbing stairs? 0  Dressing or bathing? 0  Doing errands, shopping? 1  Comment  pt's daughter  Quarry manager and eating ? N  Using the Toilet?  N  In the past six months, have you accidently leaked urine? Y  Do you have problems with loss of bowel control? Y  Managing your Medications? N  Managing your Finances? N  Housekeeping or managing your Housekeeping? Y  Comment pt's daughter    Patient Care Team: [name], DO as PCP - General (Family Medicine) Billee Mliss BIRCH, [facility] (Pharmacist) Elisabeth Valli BIRCH, MD as Consulting Physician (Urology) Waylan Cain, MD as Consulting Physician (Ophthalmology)  I have updated your Care Teams any recent Medical Services you may have received from other providers in the past year.     Assessment:   This is a routine wellness examination for [facility].  Hearing/Vision screen Hearing Screening - Comments:: Pt use hearing aids Vision Screening - Comments:: Pt wear glasses denies vision dif/pt goes to Dr. Effie in [place]/upcoming appt in 11/25   Goals Addressed             This Visit's Progress    Exercise 150 minutes per week (moderate activity)   On track    Try to increase physical activity to 30 minutes daily       Depression Screen     08/18/2023   11:13 AM 04/14/2023    1:21 PM 01/11/2023    2:22 PM 09/09/2022    3:47 PM 08/17/2022    1:25 PM 05/08/2022    2:57 PM 04/17/2022    2:34 PM  PHQ 2/9 Scores  PHQ - 2 Score 0 0 0 0 0 0 0  PHQ- 9 Score 1 0 2 0  2 0    Fall Risk     08/18/2023   12:55 PM 04/14/2023    1:21 PM 01/11/2023    2:27 PM 09/09/2022    3:43 PM 08/17/2022    1:23 PM  Fall Risk   Falls in the past year? 1 0 0 1 1  Number falls in past yr: 1 0 0 1 1  Injury with Fall? 1 0 0 1 1  Risk for fall due to : History of fall(s);Impaired balance/gait;Impaired mobility No Fall Risks No Fall Risks History of fall(s) History of fall(s);Impaired balance/gait;Orthopedic patient  Follow up  Falls evaluation completed Education provided Education provided Education provided;Falls prevention  discussed;Falls evaluation completed    MEDICARE RISK AT HOME:  Medicare Risk at Home Any stairs in or around the home?: Yes If so, are there any without handrails?: Yes Home free of loose throw rugs in walkways, pet beds, electrical cords, etc?: Yes Adequate lighting in your home to reduce risk of falls?: Yes Life alert?: No Use of a cane, walker or w/c?: Yes (walker/cane prn) Grab bars in the bathroom?: Yes Shower chair or bench in shower?: Yes Elevated toilet seat or a handicapped toilet?: No  TIMED UP AND GO:  Was the test performed?  no  Cognitive Function: 6CIT completed    05/08/2022    3:15 PM 07/29/2017   12:06 PM 07/27/2016    9:08 AM  MMSE - Mini Mental State Exam  Orientation to time 5 5 5    Orientation to Place 5 5 5    Registration 3 3 3    Attention/ Calculation 5 5 5    Recall 3 3 3    Language- name 2 objects 2 2 2    Language- repeat 1 1 1   Language- follow 3 step command 3 3 3    Language- read & follow direction 1 1 1    Write a sentence 1 1 1  Copy design 1 1 1    Total score 30 30 30       Data saved with a previous flowsheet row definition        08/18/2023   11:14 AM 08/17/2022    1:26 PM 08/13/2021    3:44 PM 11/17/2018    2:01 PM  6CIT Screen  What Year? 0 points 0 points 0 points 0 points  What month? 0 points 0 points 0 points 0 points  What time? 0 points 0 points 0 points 0 points  Count back from 20 0 points 0 points 0 points 0 points  Months in reverse 0 points 0 points 0 points 0 points  Repeat phrase 0 points 0 points 2 points 0 points  Total Score 0 points 0 points 2 points 0 points    Immunizations Immunization History  Administered Date(s) Administered   Fluad Quad(high Dose 65+) 11/15/2018, 11/22/2019   Fluad Trivalent(High Dose 65+) 01/11/2023   Influenza, High Dose Seasonal PF 11/29/2015, 11/18/2016, 12/06/2017   Influenza,inj,Quad PF,6+ Mos 12/07/2013, 11/12/2014   Influenza-Unspecified 11/20/2020   Moderna Covid Bivalent Peds  Booster(71mo Thru 10yrs) 07/08/2021   PFIZER(Purple Top)SARS-COV-2 Vaccination 04/16/2019, 05/16/2019, 11/13/2019, 05/23/2020, 11/20/2020   Pfizer(Comirnaty)Fall Seasonal Vaccine 12 years and older 11/25/2021   Pneumococcal Conjugate-13 03/17/2013   Pneumococcal Polysaccharide-23 07/29/2017, 02/09/2018   Respiratory Syncytial Virus Vaccine,Recomb Aduvanted(Arexvy) 12/10/2021   Tdap 04/14/2023   Zoster Recombinant(Shingrix ) 03/18/2021, 09/15/2021    Screening Tests Health Maintenance  Topic Date Due   COVID-19 Vaccine (8 - 2024-25 season) 09/02/2024 (Originally 10/18/2022)   FOOT EXAM  09/09/2023   INFLUENZA VACCINE  09/17/2023   HEMOGLOBIN A1C  10/12/2023   Diabetic kidney evaluation - Urine ACR  11/04/2023   OPHTHALMOLOGY EXAM  01/04/2024   Diabetic kidney evaluation - eGFR measurement  04/13/2024   Medicare Annual Wellness (AWV)  08/17/2024   DEXA SCAN  09/08/2024   Fecal DNA (Cologuard)  04/25/2026   DTaP/Tdap/Td (2 - Td or Tdap) 04/13/2033   Pneumococcal Vaccine: 50+ Years  Completed   Hepatitis C Screening  Completed   Zoster Vaccines- Shingrix   Completed   Hepatitis B Vaccines  Aged Out   HPV VACCINES  Aged Out   Meningococcal B Vaccine  Aged Out    Health Maintenance  There are no preventive care reminders to display for this patient.  Health Maintenance Items Addressed: See Nurse Notes at the end of this note  Additional Screening:  Vision Screening: Recommended annual ophthalmology exams for early detection of glaucoma and other disorders of the eye. Would you like a referral to an eye doctor? No    Dental Screening: Recommended annual dental exams for proper oral hygiene  Community Resource Referral / Chronic Care Management: CRR required this visit?  No   CCM required this visit?  No   Plan:    I have personally reviewed and noted the following in the patient's chart:   Medical and social history Use of alcohol, tobacco or illicit drugs  Current  medications and supplements including opioid prescriptions. Patient is not currently taking opioid prescriptions. Functional ability and status Nutritional status Physical activity Advanced directives List of other physicians Hospitalizations, surgeries, and ER visits in previous 12 months Vitals Screenings to include cognitive, depression, and falls Referrals and appointments  In addition, I have reviewed and discussed with patient certain preventive protocols, quality metrics, and best practice recommendations. A written personalized care plan for preventive services as well as general preventive health recommendations were provided to  patient.   Ozie Ned, CMA   08/18/2023   After Visit Summary: (MyChart) Due to this being a telephonic visit, the after visit summary with patients personalized plan was offered to patient via MyChart   Notes: FYI PCP:pt c/o having no way check her Blood Sugar/pt needs new supply and testing meter

## 2023-08-18 NOTE — Patient Instructions (Signed)
 [name] , Thank you for taking time out of your busy schedule to complete your Annual Wellness Visit with me. I enjoyed our conversation and look forward to speaking with you again next year. I, as well as your care team,  appreciate your ongoing commitment to your health goals. Please review the following plan we discussed and let me know if I can assist you in the future. Your Game plan/ To Do List    Follow up Visits: Next Medicare AWV with our clinical staff: 08/21/24 at 11:20a.m.   Have you seen your provider in the last 6 months (3 months if uncontrolled diabetes)? Yes Next Office Visit with your provider: 09/22/23 at 3:45p.m.  Clinician Recommendations:  Aim for 30 minutes of exercise or brisk walking, 6-8 glasses of water , and 5 servings of fruits and vegetables each day.       This is a list of the screening recommended for you and due dates:  Health Maintenance  Topic Date Due   Medicare Annual Wellness Visit  08/17/2023   COVID-19 Vaccine (8 - 2024-25 season) 09/02/2024*   Complete foot exam   09/09/2023   Flu Shot  09/17/2023   Hemoglobin A1C  10/12/2023   Yearly kidney health urinalysis for diabetes  11/04/2023   Eye exam for diabetics  01/04/2024   Yearly kidney function blood test for diabetes  04/13/2024   DEXA scan (bone density measurement)  09/08/2024   Cologuard (Stool DNA test)  04/25/2026   DTaP/Tdap/Td vaccine (2 - Td or Tdap) 04/13/2033   Pneumococcal Vaccine for age over 43  Completed   Hepatitis C Screening  Completed   Zoster (Shingles) Vaccine  Completed   Hepatitis B Vaccine  Aged Out   HPV Vaccine  Aged Out   Meningitis B Vaccine  Aged Out  *Topic was postponed. The date shown is not the original due date.    Advanced directives: (In Chart) A copy of your advanced directives are scanned into your chart should your provider ever need it. Advance Care Planning is important because it:  [x]  Makes sure you receive the medical care that is consistent with  your values, goals, and preferences  [x]  It provides guidance to your family and loved ones and reduces their decisional burden about whether or not they are making the right decisions based on your wishes.  Follow the link provided in your after visit summary or read over the paperwork we have mailed to you to help you started getting your Advance Directives in place. If you need assistance in completing these, please reach out to us  so that we can help you!  See attachments for Preventive Care and Fall Prevention Tips.

## 2023-08-19 ENCOUNTER — Telehealth: Payer: Self-pay | Admitting: Pharmacy Technician

## 2023-08-19 NOTE — Progress Notes (Signed)
   08/19/2023  Patient ID: Debra Miranda, female   DOB: Jul 24, 1945, 78 y.o.   MRN: [id number]  Patient engaged with clinical pharmacist for management of diabetes on 3/182025. Outreach by [employer] technician was requested.   Outreached patient to discuss diabetes medication management. Left voicemail for patient to return my call at their convenience.   Mickie Badders, CPhT [facility] Office: [phone] Email: [email or web address]

## 2023-08-24 ENCOUNTER — Telehealth: Payer: Self-pay | Admitting: Pharmacy Technician

## 2023-08-24 NOTE — Progress Notes (Signed)
   08/24/2023  Patient ID: Debra Miranda, female   DOB: 1945-09-04, 78 y.o.   MRN: [id number]  Patient engaged with clinical pharmacist for management of diabetes on 05/04/2023. Outreach by [employer] technician was requested.   Outreached patient to discuss diabetes medication management. Left voicemail for patient to return my call at their convenience.   Cieara Stierwalt, CPhT [facility] Office: [phone] Email: [email or web address]

## 2023-08-27 ENCOUNTER — Telehealth: Payer: Self-pay | Admitting: Pharmacy Technician

## 2023-08-27 NOTE — Progress Notes (Signed)
   08/27/2023  Patient ID: Debra Miranda, female   DOB: 25-Jun-1945, 78 y.o.   MRN: [id number]  Patient engaged with clinical pharmacist for management of diabetes on 05/04/2023. Outreach by [employer] technician was requested.   Outreached patient to discuss diabetes medication management. Left voicemail for patient to return my call at their convenience.   [place], CPhT [facility] Office: [phone] Email: [email or web address]

## 2023-09-20 ENCOUNTER — Telehealth: Payer: Self-pay

## 2023-09-20 NOTE — Telephone Encounter (Signed)
Appointment taken care of

## 2023-09-20 NOTE — Telephone Encounter (Signed)
 Called to offer patient earlier appointment per Dr. Melba request. If patient returns call please transfer to [facility].

## 2023-09-20 NOTE — Telephone Encounter (Unsigned)
 Copied from CRM [id number]. Topic: General - Call Back - No Documentation >> Sep 20, 2023  9:59 AM Rea BROCKS wrote: Reason for CRM: Patient returned phone call for [facility]. Called [facility] to transfer patient but Suzen was not available at the moment. Patient stated she will like to come in at her appointed time, if Dr.G is not going to be in the office. Patient said to give her a call back to let her know for sure.

## 2023-09-22 ENCOUNTER — Ambulatory Visit: Admitting: Family Medicine

## 2023-09-28 ENCOUNTER — Encounter: Payer: Self-pay | Admitting: Family Medicine

## 2023-09-28 ENCOUNTER — Ambulatory Visit (INDEPENDENT_AMBULATORY_CARE_PROVIDER_SITE_OTHER): Admitting: Family Medicine

## 2023-09-28 VITALS — BP 139/81 | HR 81 | Temp 97.7°F | Ht 67.0 in | Wt 172.1 lb

## 2023-09-28 DIAGNOSIS — L84 Corns and callosities: Secondary | ICD-10-CM | POA: Diagnosis not present

## 2023-09-28 DIAGNOSIS — E1169 Type 2 diabetes mellitus with other specified complication: Secondary | ICD-10-CM | POA: Diagnosis not present

## 2023-09-28 DIAGNOSIS — M130 Polyarthritis, unspecified: Secondary | ICD-10-CM | POA: Insufficient documentation

## 2023-09-28 DIAGNOSIS — Z794 Long term (current) use of insulin: Secondary | ICD-10-CM

## 2023-09-28 DIAGNOSIS — E785 Hyperlipidemia, unspecified: Secondary | ICD-10-CM | POA: Diagnosis not present

## 2023-09-28 LAB — BAYER DCA HB A1C WAIVED: HB A1C (BAYER DCA - WAIVED): 6.5 % — ABNORMAL HIGH (ref 4.8–5.6)

## 2023-09-28 MED ORDER — LANCET DEVICE MISC: 0 refills | Status: AC

## 2023-09-28 MED ORDER — BLOOD GLUCOSE TEST VI STRP
ORAL_STRIP | 3 refills | Status: AC
Start: 2023-09-28 — End: ?

## 2023-09-28 MED ORDER — BLOOD GLUCOSE MONITORING SUPPL DEVI: 0 refills | Status: AC

## 2023-09-28 MED ORDER — LANCETS MISC. MISC: 3 refills | Status: AC

## 2023-09-28 NOTE — Progress Notes (Signed)
 Subjective: CC:DM PCP: [name], DO YEP:Debra Miranda is a 78 y.o. female presenting to clinic today for:  1. Type 2 Diabetes with hyperlipidemia:  Glucometer: Not currently losing anything because she has not had the right test trips for her meter and does not have any more lancets.   Blood sugar was noted to be uncontrolled at her February visit.  This is despite compliance with Basaglar , Farxiga .  Her insulin  regimen was advanced and she was instructed to follow-up with clinical pharmacy for ongoing titration and close monitoring via CGM.  It up is that she did follow-up once on 05/04/2023.  She reports that her current injection amount is 22.  Her blood sugars have not been checked.  She failed the CGM due to it falling off and she got frustrated with that  Diabetes Health Maintenance Due  Topic Date Due   FOOT EXAM  09/09/2023   HEMOGLOBIN A1C  10/12/2023   OPHTHALMOLOGY EXAM  01/04/2024    Last A1c:  Lab Results  Component Value Date   HGBA1C 8.3 (H) 04/14/2023    ROS: Denies dizziness, LOC, polyuria, polydipsia, unintended weight loss/gain, foot ulcerations, numbness or tingling in extremities, shortness of breath or chest pain.  Her biggest complaint is polyarthralgia, particularly in her hands and left hip.   ROS: Per HPI  Allergies  Allergen Reactions   Augmentin  [Amoxicillin -Pot Clavulanate] Nausea And Vomiting   Latex Itching   Past Medical History:  Diagnosis Date   Arthritis    right hand   Cataract    bilateral   Depression    many years ago, no meds   Diabetes mellitus without complication (HCC)    type 2   Endometrial adenocarcinoma (HCC) 01/05/2018   treated with HDR   Environmental allergies    Family history of breast cancer    Family history of kidney cancer    Family history of pancreatic cancer    Family history of stomach cancer    Family history of uterine cancer    Hemorrhoids    History of radiation therapy  04/05/18,02/25,3/3,3/10,3/18   04/05/2018, 04/12/2018, 04/19/2018, 04/26/2018, 05/04/2018: Vagina, Iridium HDR, 3 cm cylinder, tx length of 3 cm / 30 Gy in 5 fractions.. Dr. Lynwood Nasuti   Incarcerated umbilical hernia    Nodular basal cell carcinoma (BCC) 08/2017   LEFT LOWER LEG above foot   Orthostatic hypotension    Patient is unaware of this diagnosis   Strangulated umbilical hernia     Current Outpatient Medications:    atorvastatin  (LIPITOR) 20 MG tablet, Take 1 tablet (20 mg total) by mouth daily., Disp: 90 tablet, Rfl: 3   Calcium -Vitamin D  600-5 MG-MCG TABS, Take by mouth., Disp: , Rfl:    Cholecalciferol (VITAMIN D3) 50 MCG (2000 UT) TABS, Take by mouth., Disp: , Rfl:    dapagliflozin  propanediol (FARXIGA ) 10 MG TABS tablet, Take 1 tablet (10 mg total) by mouth daily., Disp: 90 tablet, Rfl: 4   escitalopram  (LEXAPRO ) 10 MG tablet, Take 1 tablet (10 mg total) by mouth daily., Disp: 90 tablet, Rfl: 3   Insulin  Glargine (BASAGLAR  KWIKPEN) 100 UNIT/ML, Inject 24 Units into the skin at bedtime., Disp: 45 mL, Rfl: 5   Insulin  Pen Needle 32G X 6 MM MISC, Use with insulin , Disp: 300 each, Rfl: 2   Lactobacillus-Inulin (CULTURELLE DIGESTIVE HEALTH) CAPS, Take 1 capsule daily for digestive health, Disp: 90 capsule, Rfl: 3 Social History   Socioeconomic History   Marital status: Widowed  Spouse name: Not on file   Number of children: 1   Years of education: 14   Highest education level: Associate degree: occupational, Scientist, product/process development, or vocational program  Occupational History   Occupation: Retired    Comment: [employer]  Tobacco Use   Smoking status: Every Day    Types: Cigars   Smokeless tobacco: Never   Tobacco comments:    4 small cigars a day  Vaping Use   Vaping status: Former   Start date: 02/16/2010   Quit date: 02/17/2011   Devices: unsure  Substance and Sexual Activity   Alcohol use: Not Currently   Drug use: No   Sexual activity: Not Currently  Other Topics  Concern   Not on file  Social History Narrative   Her daughter lives with her and helps with bills and ride if she needs   Patient is pretty independent, but sometimes has excessive pain and needs assistance   Social Drivers of Health   Financial Resource Strain: Low Risk  (08/18/2023)   Overall Financial Resource Strain (CARDIA)    Difficulty of Paying Living Expenses: Not hard at all  Food Insecurity: No Food Insecurity (08/18/2023)   Hunger Vital Sign    Worried About Running Out of Food in the Last Year: Never true    Ran Out of Food in the Last Year: Never true  Transportation Needs: No Transportation Needs (08/18/2023)   PRAPARE - Administrator, Civil Service (Medical): No    Lack of Transportation (Non-Medical): No  Physical Activity: Insufficiently Active (08/18/2023)   Exercise Vital Sign    Days of Exercise per Week: 2 days    Minutes of Exercise per Session: 30 min  Stress: No Stress Concern Present (08/18/2023)   [employer] of Occupational Health - Occupational Stress Questionnaire    Feeling of Stress: Not at all  Social Connections: Moderately Isolated (08/18/2023)   Social Connection and Isolation Panel    Frequency of Communication with Friends and Family: More than three times a week    Frequency of Social Gatherings with Friends and Family: More than three times a week    Attends Religious Services: More than 4 times per year    Active Member of [employer] or Organizations: No    Attends Banker Meetings: Never    Marital Status: Widowed  Intimate Partner Violence: Not At Risk (08/18/2023)   Humiliation, Afraid, Rape, and Kick questionnaire    Fear of Current or Ex-Partner: No    Emotionally Abused: No    Physically Abused: No    Sexually Abused: No   Family History  Problem Relation Age of Onset   Cervical cancer Mother 25       treated with radiation   Pancreatic cancer Mother 31   Stroke Father    Pulmonary fibrosis Brother     Alzheimer's disease Brother    Stomach cancer Brother        dx 32s   Diabetes Brother    Lung cancer Maternal Grandmother    Stroke Paternal Grandfather    Uterine cancer Maternal Aunt        dx 39s   Kidney cancer Maternal Uncle        dx 22s   Colon cancer Maternal Uncle        dx 2s   Breast cancer Cousin        maternal cousin dx 44    Objective: Office vital signs reviewed. BP 139/81  Pulse 81   Temp 97.7 F (36.5 C)   Ht 5' 7 (1.702 m)   Wt 172 lb 2 oz (78.1 kg)   SpO2 91%   BMI 26.96 kg/m   Physical Examination:  General: Awake, alert, well nourished, No acute distress HEENT: Sclera white.  Moist mucous membranes Cardio: regular rate and rhythm, S1S2 heard, no murmurs appreciated Pulm: clear to auscultation bilaterally, no wheezes, rhonchi or rales; normal work of breathing on room air MSK: Ambulating independently.  She has osteoarthritic changes to the DIPs and knees bilaterally  Diabetic Foot Exam - Simple   Simple Foot Form Diabetic Foot exam was performed with the following findings: Yes 09/28/2023  3:35 PM  Visual Inspection See comments: Yes Sensation Testing Intact to touch and monofilament testing bilaterally: Yes Pulse Check Posterior Tibialis and Dorsalis pulse intact bilaterally: Yes Comments She has plantar calluses noted along the first MTP and fifth MTP on the left.  No overt ulceration.  No pain.  She has onychomycotic changes to the nails bilaterally      Assessment/ Plan: 78 y.o. female   Type 2 diabetes mellitus with other specified complication, with long-term current use of insulin  (HCC) - Plan: Bayer DCA Hb A1c Waived, Microalbumin / creatinine urine ratio, Blood Glucose Monitoring Suppl DEVI, Glucose Blood (BLOOD GLUCOSE TEST STRIPS) STRP, Lancet Device MISC, Lancets Misc. MISC  Hyperlipidemia associated with type 2 diabetes mellitus (HCC)  Polyarthritis  Foot callus  Offered diabetic shoes but she declined this.  I have  ordered her testing supplies.  Urine microalbumin and A1c collected.  Anticipate will probably need to advance her insulin  further.  Reinforced need for at least twice daily sugar checks with use of insulin   Okay to continue Tylenol  for arthritis.  Should she decide I will be glad to place a referral to [facility] in [place] for hip and/or spinal injections  Chace Klippel CHRISTELLA Fielding, DO [facility] [phone]

## 2023-09-29 ENCOUNTER — Ambulatory Visit: Payer: Self-pay | Admitting: Family Medicine

## 2023-09-29 LAB — MICROALBUMIN / CREATININE URINE RATIO
Creatinine, Urine: 43.2 mg/dL
Microalb/Creat Ratio: 36 mg/g{creat} — ABNORMAL HIGH (ref 0–29)
Microalbumin, Urine: 15.7 ug/mL

## 2023-10-19 ENCOUNTER — Telehealth: Payer: Self-pay

## 2023-10-19 NOTE — Telephone Encounter (Signed)
 Copied from CRM #[id number]. Topic: Clinical - Medication Question >> Oct 19, 2023 10:57 AM Tonda B wrote: Reason for CRM: patient is calling asking about push button safety LANCET 28G has questions about rx please call patient back [phone] (H)  she says she goes through new account  [id number] under   [place] [facility]  [phone] Fax [phone]

## 2023-10-19 NOTE — Telephone Encounter (Signed)
 Duplicate

## 2023-10-19 NOTE — Telephone Encounter (Signed)
Left message to call back to gather more information

## 2023-10-21 ENCOUNTER — Other Ambulatory Visit: Payer: Self-pay | Admitting: Family Medicine

## 2023-10-21 DIAGNOSIS — E1169 Type 2 diabetes mellitus with other specified complication: Secondary | ICD-10-CM

## 2023-10-21 MED ORDER — ESCITALOPRAM OXALATE 10 MG PO TABS: 10.0000 mg | ORAL_TABLET | Freq: Every day | ORAL | 0 refills | Status: DC

## 2023-10-21 MED ORDER — ATORVASTATIN CALCIUM 20 MG PO TABS
20.0000 mg | ORAL_TABLET | Freq: Every day | ORAL | 0 refills | Status: AC
Start: 2023-10-21 — End: ?

## 2023-10-21 NOTE — Telephone Encounter (Signed)
 Copied from CRM [id number]. Topic: Clinical - Medication Refill >> Oct 21, 2023  1:03 PM Nathanel BROCKS wrote: New health ins [facility] PI#[id number] [phone]   Medication:  escitalopram  (LEXAPRO ) 10 MG tablet atorvastatin  (LIPITOR) 20 MG tablet  Has the patient contacted their pharmacy? No   This is the patient's preferred pharmacy:  [facility] - [place], [place] - [address] [[id number]] Fax: [phone]  Is this the correct pharmacy for this prescription? Yes If no, delete pharmacy and type the correct one.   Has the prescription been filled recently? Yes  Is the patient out of the medication? Yes  Has the patient been seen for an appointment in the last year OR does the patient have an upcoming appointment? Yes  Can we respond through MyChart? No  Agent: Please be advised that Rx refills may take up to 3 business days. We ask that you follow-up with your pharmacy.   ----------------------------------------------------------------------- From previous Reason for Contact - Prescription Issue: Reason for CRM: pt has changed insurance companies and she is in need of 2 prescriptions to be refilled.   escitalopram  (LEXAPRO ) 10 MG tablet atorvastatin  (LIPITOR) 20 MG tablet

## 2023-10-27 NOTE — Telephone Encounter (Signed)
 I called and spoke with patient and she said she figured it out. Says she is good now using her lancets but will need more lancets with the safety clips from [place] when she comes back in [place] at her next appt.

## 2023-11-29 ENCOUNTER — Telehealth: Payer: Self-pay

## 2023-11-29 NOTE — Telephone Encounter (Signed)
   Has appt 12/29/23  Will go ahead and mail both AZ&ME and [employer] re-enrollment applications to patients home.

## 2023-11-30 NOTE — Telephone Encounter (Signed)
Mailed to pt home

## 2023-12-10 NOTE — Telephone Encounter (Signed)
 Patient left message regarding PAP renewal.   Returned patients call and informed her the apps were mailed to her last week. Patient believes she may still have them but will give me a call back if she does not.

## 2023-12-10 NOTE — Telephone Encounter (Signed)
 Patient would like both apps remailed.

## 2023-12-15 NOTE — Telephone Encounter (Signed)
 Applications placed in mail, 2nd attempt.

## 2023-12-29 ENCOUNTER — Ambulatory Visit: Payer: Self-pay | Admitting: Family Medicine

## 2024-01-03 NOTE — Telephone Encounter (Signed)
 Farxiga  will need refills once re-enrollment is submitted.

## 2024-01-07 ENCOUNTER — Telehealth: Payer: Self-pay | Admitting: Family Medicine

## 2024-01-07 ENCOUNTER — Telehealth: Payer: Self-pay | Admitting: Pharmacist

## 2024-01-07 DIAGNOSIS — E119 Type 2 diabetes mellitus without complications: Secondary | ICD-10-CM

## 2024-01-07 MED ORDER — BASAGLAR KWIKPEN 100 UNIT/ML ~~LOC~~ SOPN: 24.0000 [IU] | PEN_INJECTOR | Freq: Every day | SUBCUTANEOUS | 5 refills | Status: DC

## 2024-01-07 NOTE — Telephone Encounter (Signed)
 Copied from CRM #[id number]. Topic: Clinical - Prescription Issue >> Jan 07, 2024  9:18 AM Graeme ORN wrote: Reason for CRM: Patient states she needs provider to complete forms and Rx from [facility] so she can continue to get her medication. Thank You    ----------------------------------------------------------------------- From previous Reason for Contact - Cancel/Reschedule: Patient/patient representative is calling to cancel or reschedule an appointment. Refer to attachments for appointment information.

## 2024-01-07 NOTE — Telephone Encounter (Signed)
 No answer Left VM with detailed information  Requested callback to PharmD

## 2024-01-07 NOTE — Telephone Encounter (Signed)
 Application filled and out and faxed to CPhT for completion

## 2024-01-07 NOTE — Telephone Encounter (Signed)
   Assisted with [employer] re-enrollment paperwork and escribed Basaglar  to [facility] pharmacy.  Patient requested lancets.  Left samples up front for patient to pick up. Patient also receiving Farxiga  via AZ&me PAP.  Will escribe refills to [facility] pharmacy.  Patient stable on current therapy.   Nyoka Alcoser Dattero Rodell Marrs, PharmD, BCACP, CPP Clinical Pharmacist, [facility]

## 2024-01-07 NOTE — Telephone Encounter (Signed)
 Both AZ&ME and [employer] applications rec'd from patient.   AZ&ME application for Farxiga  re-enrollment faxed to company.   Provider pages of [employer] application emailed to [employer] for completion.

## 2024-01-07 NOTE — Telephone Encounter (Signed)
 Application rec'd and being handled by [place] (on 11/29/23 telephone note).

## 2024-01-11 NOTE — Telephone Encounter (Signed)
 PAP:  Re-enrollment application for Basaglar  has been submitted to [employer], via fax.  Please send new 90 day RX to [facility] specialty pharmacy for patients re-enrollment. Thanks!

## 2024-01-12 ENCOUNTER — Telehealth: Payer: Self-pay | Admitting: Pharmacist

## 2024-01-12 DIAGNOSIS — E119 Type 2 diabetes mellitus without complications: Secondary | ICD-10-CM

## 2024-01-12 MED ORDER — DAPAGLIFLOZIN PROPANEDIOL 10 MG PO TABS: 10.0000 mg | ORAL_TABLET | Freq: Every day | ORAL | 4 refills | Status: AC

## 2024-01-12 MED ORDER — BASAGLAR KWIKPEN 100 UNIT/ML ~~LOC~~ SOPN: 24.0000 [IU] | PEN_INJECTOR | Freq: Every day | SUBCUTANEOUS | 5 refills | Status: AC

## 2024-01-12 NOTE — Telephone Encounter (Signed)
 AZ&ME APPROVAL FOR FARXIGA :

## 2024-01-12 NOTE — Telephone Encounter (Signed)
    Spoke with patient today to review blood sugar.  She has not been checking blood sugars in several weeks.  She does not recall exact numbers. She does deny hypoglycemia <70.  She reports she is in pain from arthritis and it is hard for her to get to appointments.  She compliant with atorvastatin  and all other medications.  Unclear why she is not on ACE/ARB, however BP is normal and she is on SGLT2.  Reports she is taking a new supplement by [employer] (morning kick) which has superfoods, probiotics, collagen peptides, and adaptogens. Assisted with [employer] re-enrollment paperwork and escribed Basaglar  to [facility] pharmacy.  Patient requested push button safety lancets.  Left samples up front for patient to pick up. Patient also receiving Farxiga  via AZ&me PAP.  Will escribe refills to [facility] pharmacy.  Patient stable on current therapy.  Rober Skeels Dattero Maksymilian Mabey, PharmD, BCACP, CPP Clinical Pharmacist, [facility]

## 2024-01-18 NOTE — Telephone Encounter (Signed)
 [employer]Y APPROVAL FOR BASAGLAR :   STARTING 02/17/24

## 2024-01-31 ENCOUNTER — Other Ambulatory Visit: Payer: Self-pay | Admitting: Family Medicine

## 2024-01-31 DIAGNOSIS — E1169 Type 2 diabetes mellitus with other specified complication: Secondary | ICD-10-CM

## 2024-02-22 ENCOUNTER — Encounter: Payer: Self-pay | Admitting: Family Medicine

## 2024-02-22 ENCOUNTER — Ambulatory Visit (INDEPENDENT_AMBULATORY_CARE_PROVIDER_SITE_OTHER): Admitting: Family Medicine

## 2024-02-22 VITALS — BP 132/84 | HR 84 | Temp 97.6°F | Ht 67.0 in | Wt 170.2 lb

## 2024-02-22 DIAGNOSIS — E119 Type 2 diabetes mellitus without complications: Secondary | ICD-10-CM

## 2024-02-22 DIAGNOSIS — E1169 Type 2 diabetes mellitus with other specified complication: Secondary | ICD-10-CM

## 2024-02-22 DIAGNOSIS — E785 Hyperlipidemia, unspecified: Secondary | ICD-10-CM | POA: Diagnosis not present

## 2024-02-22 DIAGNOSIS — Z794 Long term (current) use of insulin: Secondary | ICD-10-CM

## 2024-02-22 NOTE — Progress Notes (Signed)
 "  Subjective: CC:DM PCP: [name], DO Debra Miranda is a 79 y.o. female presenting to clinic today for:  Type 2 Diabetes with hypertension, hyperlipidemia:  Compliant with her insulins.  Does not check blood sugars.  Denies any signs or symptoms suggestive of hyper or hypoglycemia including dizziness, visual disturbance, chest pain, shortness of breath.  She continues to have alternating diarrhea and constipation but this has been so since she had colonic resection.  She had her eye exam done with Dr. Waylan and they noted that her vision was better.   Diabetes Health Maintenance Due  Topic Date Due   OPHTHALMOLOGY EXAM  01/04/2024   HEMOGLOBIN A1C  03/30/2024   FOOT EXAM  09/27/2024    ROS: Per HPI  Allergies[1] Past Medical History:  Diagnosis Date   Arthritis    right hand   Cataract    bilateral   Depression    many years ago, no meds   Diabetes mellitus without complication (HCC)    type 2   Endometrial adenocarcinoma (HCC) 01/05/2018   treated with HDR   Environmental allergies    Family history of breast cancer    Family history of kidney cancer    Family history of pancreatic cancer    Family history of stomach cancer    Family history of uterine cancer    Hemorrhoids    History of radiation therapy 04/05/18,02/25,3/3,3/10,3/18   04/05/2018, 04/12/2018, 04/19/2018, 04/26/2018, 05/04/2018: Vagina, Iridium HDR, 3 cm cylinder, tx length of 3 cm / 30 Gy in 5 fractions.. Dr. Lynwood Nasuti   Incarcerated umbilical hernia    Nodular basal cell carcinoma (BCC) 08/2017   LEFT LOWER LEG above foot   Orthostatic hypotension    Patient is unaware of this diagnosis   Strangulated umbilical hernia    Current Medications[2] Social History   Socioeconomic History   Marital status: Widowed    Spouse name: Not on file   Number of children: 1   Years of education: 14   Highest education level: Associate degree: occupational, scientist, product/process development, or vocational program   Occupational History   Occupation: Retired    Comment: [employer]  Tobacco Use   Smoking status: Every Day    Types: Cigars   Smokeless tobacco: Never   Tobacco comments:    4 small cigars a day  Vaping Use   Vaping status: Former   Start date: 02/16/2010   Quit date: 02/17/2011   Devices: unsure  Substance and Sexual Activity   Alcohol use: Not Currently   Drug use: No   Sexual activity: Not Currently  Other Topics Concern   Not on file  Social History Narrative   Her daughter lives with her and helps with bills and ride if she needs   Patient is pretty independent, but sometimes has excessive pain and needs assistance   Social Drivers of Health   Tobacco Use: High Risk (09/28/2023)   Patient History    Smoking Tobacco Use: Every Day    Smokeless Tobacco Use: Never    Passive Exposure: Not on file  Financial Resource Strain: Low Risk (08/18/2023)   Overall Financial Resource Strain (CARDIA)    Difficulty of Paying Living Expenses: Not hard at all  Food Insecurity: No Food Insecurity (08/18/2023)   Epic    Worried About Programme Researcher, Broadcasting/film/video in the Last Year: Never true    Ran Out of Food in the Last Year: Never true  Transportation Needs: No Transportation Needs (  08/18/2023)   Epic    Lack of Transportation (Medical): No    Lack of Transportation (Non-Medical): No  Physical Activity: Insufficiently Active (08/18/2023)   Exercise Vital Sign    Days of Exercise per Week: 2 days    Minutes of Exercise per Session: 30 min  Stress: No Stress Concern Present (08/18/2023)   [employer] of Occupational Health - Occupational Stress Questionnaire    Feeling of Stress: Not at all  Social Connections: Moderately Isolated (08/18/2023)   Social Connection and Isolation Panel    Frequency of Communication with Friends and Family: More than three times a week    Frequency of Social Gatherings with Friends and Family: More than three times a week    Attends Religious Services: More  than 4 times per year    Active Member of [employer] or Organizations: No    Attends Banker Meetings: Never    Marital Status: Widowed  Intimate Partner Violence: Not At Risk (08/18/2023)   Epic    Fear of Current or Ex-Partner: No    Emotionally Abused: No    Physically Abused: No    Sexually Abused: No  Depression (PHQ2-9): Low Risk (09/28/2023)   Depression (PHQ2-9)    PHQ-2 Score: 1  Alcohol Screen: Low Risk (08/18/2023)   Alcohol Screen    Last Alcohol Screening Score (AUDIT): 0  Housing: Unknown (08/18/2023)   Epic    Unable to Pay for Housing in the Last Year: No    Number of Times Moved in the Last Year: Not on file    Homeless in the Last Year: No  Utilities: Not At Risk (08/18/2023)   Epic    Threatened with loss of utilities: No  Health Literacy: Adequate Health Literacy (08/18/2023)   B1300 Health Literacy    Frequency of need for help with medical instructions: Never   Family History  Problem Relation Age of Onset   Cervical cancer Mother 41       treated with radiation   Pancreatic cancer Mother 50   Stroke Father    Pulmonary fibrosis Brother    Alzheimer's disease Brother    Stomach cancer Brother        dx 44s   Diabetes Brother    Lung cancer Maternal Grandmother    Stroke Paternal Grandfather    Uterine cancer Maternal Aunt        dx 59s   Kidney cancer Maternal Uncle        dx 40s   Colon cancer Maternal Uncle        dx 31s   Breast cancer Cousin        maternal cousin dx 23    Objective: Office vital signs reviewed. BP 132/84   Pulse 84   Temp 97.6 F (36.4 C)   Ht 5' 7 (1.702 m)   Wt 170 lb 4 oz (77.2 kg)   SpO2 95%   BMI 26.66 kg/m   Physical Examination:  General: Awake, alert, well nourished, No acute distress HEENT: sclera white, MMM Cardio: regular rate and rhythm, S1S2 heard, no murmurs appreciated Pulm: clear to auscultation bilaterally, no wheezes, rhonchi or rales; normal work of breathing on room air   Lab Results   Component Value Date   HGBA1C 6.5 (H) 09/28/2023    Assessment/ Plan: 79 y.o. female   Insulin -requiring or dependent type II diabetes mellitus (HCC) - Plan: Bayer DCA Hb A1c Waived  Hyperlipidemia associated with type 2 diabetes mellitus (HCC)  A1c was ordered but did not run.  She will need to come back for recollection.  Continue medications as prescribed for now.   Norene CHRISTELLA Fielding, DO [facility] [phone]     [1]  Allergies Allergen Reactions   Augmentin  [Amoxicillin -Pot Clavulanate] Nausea And Vomiting   Latex Itching  [2]  Current Outpatient Medications:    atorvastatin  (LIPITOR) 20 MG tablet, TAKE 1 TABLET BY MOUTH EVERY DAY, Disp: 90 tablet, Rfl: 0   Blood Glucose Monitoring Suppl DEVI, Check BGs 2-3 times daily. E11.69. May substitute to any manufacturer covered by patient's insurance., Disp: 1 each, Rfl: 0   Calcium -Vitamin D  600-5 MG-MCG TABS, Take by mouth., Disp: , Rfl:    Cholecalciferol (VITAMIN D3) 50 MCG (2000 UT) TABS, Take by mouth., Disp: , Rfl:    dapagliflozin  propanediol (FARXIGA ) 10 MG TABS tablet, Take 1 tablet (10 mg total) by mouth daily., Disp: 90 tablet, Rfl: 4   escitalopram  (LEXAPRO ) 10 MG tablet, TAKE 1 TABLET BY MOUTH EVERY DAY, Disp: 90 tablet, Rfl: 0   Glucose Blood (BLOOD GLUCOSE TEST STRIPS) STRP, Check BGs 2-3 times daily. E11.69.May substitute to any manufacturer covered by patient's insurance., Disp: 300 strip, Rfl: 3   Insulin  Glargine (BASAGLAR  KWIKPEN) 100 UNIT/ML, Inject 24 Units into the skin at bedtime., Disp: 45 mL, Rfl: 5   Insulin  Pen Needle 32G X 6 MM MISC, Use with insulin , Disp: 300 each, Rfl: 2   Lactobacillus-Inulin (CULTURELLE DIGESTIVE HEALTH) CAPS, Take 1 capsule daily for digestive health (Patient not taking: Reported on 09/28/2023), Disp: 90 capsule, Rfl: 3   Lancet Device MISC, Check BGs 2-3 times daily. E11.69. May substitute to any manufacturer covered by patient's insurance., Disp: 1  each, Rfl: 0   Lancets Misc. MISC, Check BGs 2-3 times daily. E11.69. May substitute to any manufacturer covered by patient's insurance., Disp: 300 each, Rfl: 3  "

## 2024-02-22 NOTE — Telephone Encounter (Signed)
 Left message advising patient that the machine in the lab did not read her A1C and that she will need to come back to have this repeated. If patient returns call please schedule lab appointment. Lab order placed.

## 2024-02-25 ENCOUNTER — Other Ambulatory Visit

## 2024-02-25 ENCOUNTER — Ambulatory Visit: Payer: Self-pay | Admitting: Family Medicine

## 2024-02-25 LAB — BAYER DCA HB A1C WAIVED: HB A1C (BAYER DCA - WAIVED): 6.5 % — ABNORMAL HIGH (ref 4.8–5.6)

## 2024-08-21 ENCOUNTER — Ambulatory Visit: Payer: Self-pay

## 2024-08-23 ENCOUNTER — Ambulatory Visit: Admitting: Family Medicine
